# Patient Record
Sex: Female | Born: 1950
Health system: Southern US, Community
[De-identification: ages and names within clinical notes are randomized; demographics above are authoritative.]

## PROBLEM LIST (undated history)

## (undated) DIAGNOSIS — R7303 Prediabetes: Secondary | ICD-10-CM

## (undated) DIAGNOSIS — I10 Essential (primary) hypertension: Secondary | ICD-10-CM

## (undated) DIAGNOSIS — R0600 Dyspnea, unspecified: Secondary | ICD-10-CM

## (undated) DIAGNOSIS — M1990 Unspecified osteoarthritis, unspecified site: Secondary | ICD-10-CM

## (undated) DIAGNOSIS — G4730 Sleep apnea, unspecified: Secondary | ICD-10-CM

## (undated) DIAGNOSIS — E785 Hyperlipidemia, unspecified: Secondary | ICD-10-CM

## (undated) HISTORY — PX: KNEE ARTHROSCOPY: SUR90

## (undated) HISTORY — DX: Unspecified osteoarthritis, unspecified site: M19.90

## (undated) HISTORY — DX: Hyperlipidemia, unspecified: E78.5

## (undated) HISTORY — PX: EYE SURGERY: SHX253

## (undated) HISTORY — PX: ABDOMINAL HYSTERECTOMY: SHX81

## (undated) HISTORY — PX: LUNG BIOPSY: SHX232

---

## 2007-07-24 ENCOUNTER — Emergency Department (HOSPITAL_COMMUNITY): Admission: EM | Admit: 2007-07-24 | Discharge: 2007-07-24 | Payer: Self-pay | Admitting: Family Medicine

## 2007-08-05 ENCOUNTER — Ambulatory Visit (HOSPITAL_COMMUNITY): Admission: RE | Admit: 2007-08-05 | Discharge: 2007-08-05 | Payer: Self-pay | Admitting: Sports Medicine

## 2009-02-21 ENCOUNTER — Ambulatory Visit (HOSPITAL_BASED_OUTPATIENT_CLINIC_OR_DEPARTMENT_OTHER): Admission: RE | Admit: 2009-02-21 | Discharge: 2009-02-21 | Payer: Self-pay | Admitting: Orthopedic Surgery

## 2010-05-13 ENCOUNTER — Emergency Department (HOSPITAL_COMMUNITY)
Admission: EM | Admit: 2010-05-13 | Discharge: 2010-05-13 | Payer: Self-pay | Source: Home / Self Care | Admitting: Emergency Medicine

## 2010-05-14 LAB — COMPREHENSIVE METABOLIC PANEL
Albumin: 3.6 g/dL (ref 3.5–5.2)
Alkaline Phosphatase: 65 U/L (ref 39–117)
BUN: 13 mg/dL (ref 6–23)
Calcium: 8.9 mg/dL (ref 8.4–10.5)
Creatinine, Ser: 0.97 mg/dL (ref 0.4–1.2)
Potassium: 3.1 mEq/L — ABNORMAL LOW (ref 3.5–5.1)
Total Protein: 7 g/dL (ref 6.0–8.3)

## 2010-05-14 LAB — DIFFERENTIAL
Eosinophils Absolute: 0.3 10*3/uL (ref 0.0–0.7)
Eosinophils Relative: 4 % (ref 0–5)
Lymphs Abs: 3.1 10*3/uL (ref 0.7–4.0)

## 2010-05-14 LAB — PROTIME-INR: INR: 1.03 (ref 0.00–1.49)

## 2010-05-14 LAB — CBC
MCV: 84.3 fL (ref 78.0–100.0)
Platelets: 238 10*3/uL (ref 150–400)
RDW: 14.1 % (ref 11.5–15.5)
WBC: 7.7 10*3/uL (ref 4.0–10.5)

## 2010-05-22 ENCOUNTER — Ambulatory Visit (INDEPENDENT_AMBULATORY_CARE_PROVIDER_SITE_OTHER): Payer: 59 | Admitting: Internal Medicine

## 2010-05-22 DIAGNOSIS — K625 Hemorrhage of anus and rectum: Secondary | ICD-10-CM

## 2010-05-22 DIAGNOSIS — K5730 Diverticulosis of large intestine without perforation or abscess without bleeding: Secondary | ICD-10-CM

## 2010-06-29 NOTE — Consult Note (Signed)
NAMEANNALEIGHA, Jennifer [name]               ACCOUNT NO.:  [id number]  MEDICAL RECORD NO.:  [id number]          PATIENT TYPE: AMB  LOCATION:  [place]                            FACILITY:  [facility]  PHYSICIAN:  Lionel December, M.D.    DATE OF BIRTH:  Nov 18, 1950  DATE OF CONSULTATION:05/13/2010                                CONSULTATION   REASON FOR CONSULTATION:  Rectal bleeding.  HISTORY OF PRESENT ILLNESS:  Jennifer [name] is a 60 year old black female referred to our office by Aquilla Hacker, PA in [place], [place].  She states she has had rectal bleeding.  She was seen in the emergency department on May 13, 2010.  She states she was passing dark red, maroon colored blood.  She states the blood was dripping from her rectum and she had a puddle in her car.  She did present to the emergency room and evaluated.  Her hemoglobin on admission was 10.9, hematocrit was 31.2, her MCV was 84.3, platelets 238.  Her PT/INR 13.7 and 1.03, hersodium 133, potassium was 3.1, chloride 99, CO2 24, glucose 148, BUN 13, creatinine 0.97, her total bilirubin was 0.4, ALP 65, AST 20, ALT 16, total protein 7.0, albumin 3.6, calcium 8.9.  She did undergo a CT of the abdomen and pelvis with contrast which revealed no acute abnormality, small umbilical hernia containing fat, minimal hepatic steatosis, descending and sigmoid diverticulosis, small hiatal hernia, borderline cardiomegaly.  She says her bleeding has stopped 9 days ago. She states she was not constipated during this episode.  There was no pain associated with the rectal bleeding.  She does give a history of after her bleeding stopped that she did pass a couple of black stools. She does admit to taking 2 to 4 Aleve on a daily basis for her arthritis in her knees.  Her appetite has been good, there has been no weight loss.  She denies any acid reflux.  She has a bowel movement once or twice a week.  She does take laxative, some MiraLax on a regular  basis for constipation.  Her last colonoscopy was in April 2009 which revealed a 7-mm polyp snared from the sigmoid colon pancolonic diverticulosis. She had a moderate number of diverticula at the sigmoid and a few more involving the rest of the colon, small external hemorrhoids.  The biopsy revealed tubular adenoma.  ALLERGIES:  There are no known allergies.  HOME MEDICATIONS:  Triamterene/hydrochlorothiazide 5/25 one a day, atenolol 50 mg a day, hydrocodone as needed.  PAST SURGICAL HISTORY:  She has had a total hysterectomy.  She had a right lung biopsy which revealed sarcoidosis.  She has had arthroscopies to both her knees from arthritis, history of hypertension.  There is no family history of colon cancer.  FAMILY HISTORY:  Her mother is deceased from a CVA and diabetes.  Father is deceased from a CVA.  One sister in good health and two brothers in good health.  SOCIAL HISTORY:  She is married.  She works at [employer] as a Psychologist, sport and exercise.  She smokes half-pack a day for approximately 40 years, occasional alcohol.  She has  three children, one child has had a kidney transplant and 2 are in good health.  PHYSICAL EXAMINATION:  VITAL SIGNS:  Her weight is 214, her height is 5 feet 6 inches, her BMI is 35, temperature is 99.5, blood pressure is 140/86, and her pulse is 76. HEENT:  She has natural teeth.  Her oral mucosa is moist.  There are no lesions.  Her conjunctivae are pink.  Sclerae are anicteric. NECK:  Her thyroid is normal.  No cervical lymphadenopathy. LUNGS:  Clear. HEART:  Regular rate and rhythm. ABDOMEN:  Soft.  Bowel sounds are positive.  No masses felt.  She was guaiac negative today and her stool was brown.  ASSESSMENT:  Jennifer [name] is a 60 year old female who gives a history of rectal bleeding.  I suspect this was a diverticular bleed.  It was a painless bleed.  She also states after her bleeding stopped, she had a few black stools.  I suspect her  upper GI bleed was from the Aleve that she was taking.  I did discuss this case with Dr. Karilyn Cota.  The patient was given option of a colonoscopy or we could go ahead and proceed with a CBC.  If the CBC is normal, we will watch her.  She is not to take any more than 2 Aleve today and further recommendations once we have the CBC back, which will be drawn today.    ______________________________ Dorene Ar, NP   ______________________________ Lionel December, M.D.    TS/MEDQ  D:  05/23/2010  T:  05/24/2010  Job:  [id number]  Electronically Signed by Dorene Ar PA on 06/25/2010 10:02:29 AM Electronically Signed by Lionel December M.D. on 06/29/2010 01:18:50 PM

## 2010-07-23 LAB — POCT I-STAT 4, (NA,K, GLUC, HGB,HCT)
Glucose, Bld: 112 mg/dL — ABNORMAL HIGH (ref 70–99)
HCT: 43 % (ref 36.0–46.0)
Hemoglobin: 14.6 g/dL (ref 12.0–15.0)
Potassium: 3.6 mEq/L (ref 3.5–5.1)
Sodium: 141 mEq/L (ref 135–145)

## 2012-08-15 ENCOUNTER — Encounter (INDEPENDENT_AMBULATORY_CARE_PROVIDER_SITE_OTHER): Payer: Self-pay | Admitting: *Deleted

## 2012-12-21 ENCOUNTER — Other Ambulatory Visit (INDEPENDENT_AMBULATORY_CARE_PROVIDER_SITE_OTHER): Payer: Self-pay | Admitting: *Deleted

## 2012-12-21 ENCOUNTER — Telehealth (INDEPENDENT_AMBULATORY_CARE_PROVIDER_SITE_OTHER): Payer: Self-pay | Admitting: *Deleted

## 2012-12-21 DIAGNOSIS — Z1211 Encounter for screening for malignant neoplasm of colon: Secondary | ICD-10-CM

## 2012-12-21 DIAGNOSIS — Z86010 Personal history of colonic polyps: Secondary | ICD-10-CM

## 2012-12-21 NOTE — Telephone Encounter (Signed)
Patient needs movi prep -- nkda 

## 2012-12-23 MED ORDER — SOD PHOS MONO-SOD PHOS DIBASIC 1.102-0.398 G PO TABS: 1.0000 | ORAL_TABLET | Freq: Once | ORAL | Status: DC

## 2013-01-16 ENCOUNTER — Encounter (INDEPENDENT_AMBULATORY_CARE_PROVIDER_SITE_OTHER): Payer: Self-pay | Admitting: *Deleted

## 2013-01-18 ENCOUNTER — Telehealth (INDEPENDENT_AMBULATORY_CARE_PROVIDER_SITE_OTHER): Payer: Self-pay | Admitting: *Deleted

## 2013-01-18 NOTE — Telephone Encounter (Signed)
  Procedure: tcs  Reason/Indication:  Hx polyps  Has patient had this procedure before?  Yes, 2009 (paper chart)  If so, when, by whom and where?    Is there a family history of colon cancer?  no  Who?  What age when diagnosed?    Is patient diabetic?   no      Does patient have prosthetic heart valve?  no  Do you have a pacemaker?  no  Has patient ever had endocarditis? no  Has patient had joint replacement within last 12 months?  no  Does patient tend to be constipated or take laxatives?   Is patient on Coumadin, Plavix and/or Aspirin? no  Medications: triamterene 37.5 mg daily, atenolol 50 mg daily, hydrocodone 5/325 mg 1-2 tab daily, aleve prn, tylenol prn, ibuprofen prn  Allergies: nkda  Medication Adjustment:   Procedure date & time: 02/15/13 at 930

## 2013-01-19 NOTE — Telephone Encounter (Signed)
agree

## 2013-01-24 ENCOUNTER — Telehealth (INDEPENDENT_AMBULATORY_CARE_PROVIDER_SITE_OTHER): Payer: Self-pay | Admitting: *Deleted

## 2013-01-24 DIAGNOSIS — Z1211 Encounter for screening for malignant neoplasm of colon: Secondary | ICD-10-CM

## 2013-01-24 NOTE — Telephone Encounter (Signed)
Patient needs trilye 

## 2013-01-25 MED ORDER — PEG 3350-KCL-NA BICARB-NACL 420 G PO SOLR: 4000.0000 mL | Freq: Once | ORAL | Status: DC

## 2013-01-31 ENCOUNTER — Encounter (HOSPITAL_COMMUNITY): Payer: Self-pay | Admitting: Pharmacy Technician

## 2013-02-15 ENCOUNTER — Encounter (HOSPITAL_COMMUNITY): Admission: RE | Payer: Self-pay | Source: Ambulatory Visit

## 2013-02-15 ENCOUNTER — Ambulatory Visit (HOSPITAL_COMMUNITY): Admission: RE | Admit: 2013-02-15 | Payer: 59 | Source: Ambulatory Visit | Admitting: Internal Medicine

## 2013-02-15 SURGERY — COLONOSCOPY
Anesthesia: Moderate Sedation

## 2013-11-15 ENCOUNTER — Other Ambulatory Visit: Payer: Self-pay | Admitting: Orthopedic Surgery

## 2014-01-31 ENCOUNTER — Encounter (HOSPITAL_COMMUNITY): Payer: Self-pay

## 2014-02-02 ENCOUNTER — Encounter (HOSPITAL_COMMUNITY)
Admission: RE | Admit: 2014-02-02 | Discharge: 2014-02-02 | Disposition: A | Discharge status: Home / Self Care | Payer: 59 | Source: Ambulatory Visit | Attending: Orthopedic Surgery | Admitting: Orthopedic Surgery

## 2014-02-02 ENCOUNTER — Encounter (HOSPITAL_COMMUNITY): Payer: Self-pay

## 2014-02-02 DIAGNOSIS — Z01818 Encounter for other preprocedural examination: Secondary | ICD-10-CM | POA: Diagnosis not present

## 2014-02-02 DIAGNOSIS — I10 Essential (primary) hypertension: Secondary | ICD-10-CM | POA: Diagnosis not present

## 2014-02-02 HISTORY — DX: Essential (primary) hypertension: I10

## 2014-02-02 LAB — URINALYSIS, ROUTINE W REFLEX MICROSCOPIC
Bilirubin Urine: NEGATIVE
GLUCOSE, UA: NEGATIVE mg/dL
Hgb urine dipstick: NEGATIVE
Ketones, ur: NEGATIVE mg/dL
LEUKOCYTES UA: NEGATIVE
NITRITE: NEGATIVE
PROTEIN: NEGATIVE mg/dL
Specific Gravity, Urine: 1.015 (ref 1.005–1.030)
UROBILINOGEN UA: 0.2 mg/dL (ref 0.0–1.0)
pH: 5 (ref 5.0–8.0)

## 2014-02-02 LAB — CBC WITH DIFFERENTIAL/PLATELET
BASOS PCT: 1 % (ref 0–1)
Basophils Absolute: 0.1 10*3/uL (ref 0.0–0.1)
EOS ABS: 0.3 10*3/uL (ref 0.0–0.7)
Eosinophils Relative: 4 % (ref 0–5)
HCT: 41 % (ref 36.0–46.0)
HEMOGLOBIN: 13.8 g/dL (ref 12.0–15.0)
Lymphocytes Relative: 29 % (ref 12–46)
Lymphs Abs: 2 10*3/uL (ref 0.7–4.0)
MCH: 29 pg (ref 26.0–34.0)
MCHC: 33.7 g/dL (ref 30.0–36.0)
MCV: 86.1 fL (ref 78.0–100.0)
MONOS PCT: 5 % (ref 3–12)
Monocytes Absolute: 0.4 10*3/uL (ref 0.1–1.0)
Neutro Abs: 4.2 10*3/uL (ref 1.7–7.7)
Neutrophils Relative %: 61 % (ref 43–77)
PLATELETS: 257 10*3/uL (ref 150–400)
RBC: 4.76 MIL/uL (ref 3.87–5.11)
RDW: 14 % (ref 11.5–15.5)
WBC: 6.8 10*3/uL (ref 4.0–10.5)

## 2014-02-02 LAB — COMPREHENSIVE METABOLIC PANEL
ALBUMIN: 3.8 g/dL (ref 3.5–5.2)
ALK PHOS: 94 U/L (ref 39–117)
ALT: 20 U/L (ref 0–35)
ANION GAP: 13 (ref 5–15)
AST: 21 U/L (ref 0–37)
BUN: 17 mg/dL (ref 6–23)
CO2: 25 mEq/L (ref 19–32)
Calcium: 9.8 mg/dL (ref 8.4–10.5)
Chloride: 100 mEq/L (ref 96–112)
Creatinine, Ser: 0.85 mg/dL (ref 0.50–1.10)
GFR calc Af Amer: 85 mL/min — ABNORMAL LOW (ref >90)
GFR calc non Af Amer: 73 mL/min — ABNORMAL LOW (ref >90)
Glucose, Bld: 116 mg/dL — ABNORMAL HIGH (ref 70–99)
Potassium: 3.9 mEq/L (ref 3.7–5.3)
Sodium: 138 mEq/L (ref 137–147)
TOTAL PROTEIN: 8.2 g/dL (ref 6.0–8.3)
Total Bilirubin: 0.3 mg/dL (ref 0.3–1.2)

## 2014-02-02 LAB — APTT: aPTT: 26 seconds (ref 24–37)

## 2014-02-02 LAB — PROTIME-INR
INR: 1.03 (ref 0.00–1.49)
Prothrombin Time: 13.6 seconds (ref 11.6–15.2)

## 2014-02-02 LAB — SURGICAL PCR SCREEN
MRSA, PCR: NEGATIVE
STAPHYLOCOCCUS AUREUS: NEGATIVE

## 2014-02-02 NOTE — Pre-Procedure Instructions (Signed)
Jennifer RoachHazel W [name]  02/02/2014   Your procedure is scheduled on:   Monday  02/12/14  [facility] at 530 AM.  Call this number if you have problems the morning of surgery: [phone]   Remember:   Do not eat food or drink liquids after midnight.   Take these medicines the morning of surgery with A SIP OF WATER:  ATENOLOL (TENORMIN)    (STOP IBUPROFEN , NARROXEN, ALEVE)   Do not wear jewelry, make-up or nail polish.  Do not wear lotions, powders, or perfumes. You may wear deodorant.  Do not shave 48 hours prior to surgery. Men may shave face and neck.  Do not bring valuables to the hospital.  [facility] is not responsible                  for any belongings or valuables.               Contacts, dentures or bridgework may not be worn into surgery.  Leave suitcase in the car. After surgery it may be brought to your room.  For patients admitted to the hospital, discharge time is determined by your                treatment team.               Patients discharged the day of surgery will not be allowed to drive  home.  Name and phone number of your driver:   Special Instructions:  Special Instructions: [place] - Preparing for Surgery  Before surgery, you can play an important role.  Because skin is not sterile, your skin needs to be as free of germs as possible.  You can reduce the number of germs on you skin by washing with CHG (chlorahexidine gluconate) soap before surgery.  CHG is an antiseptic cleaner which kills germs and bonds with the skin to continue killing germs even after washing.  Please DO NOT use if you have an allergy to CHG or antibacterial soaps.  If your skin becomes reddened/irritated stop using the CHG and inform your nurse when you arrive at Short Stay.  Do not shave (including legs and underarms) for at least 48 hours prior to the first CHG shower.  You may shave your face.  Please follow these instructions carefully:   1.  Shower  with CHG Soap the night before surgery and the morning of Surgery.  2.  If you choose to wash your hair, wash your hair first as usual with your normal shampoo.  3.  After you shampoo, rinse your hair and body thoroughly to remove the Shampoo.  4.  Use CHG as you would any other liquid soap. You can apply chg directly to the skin and wash gently with scrungie or a clean washcloth.  5.  Apply the CHG Soap to your body ONLY FROM THE NECK DOWN.  Do not use on open wounds or open sores.  Avoid contact with your eyes, ears, mouth and genitals (private parts).  Wash genitals (private parts with your normal soap.  6.  Wash thoroughly, paying special attention to the area where your surgery will be performed.  7.  Thoroughly rinse your body with warm water from the neck down.  8.  DO NOT shower/wash with your normal soap after using and rinsing off the CHG Soap.  9.  Pat yourself dry with a clean towel.  10.  Wear clean pajamas.            11.  Place clean sheets on your bed the night of your first shower and do not sleep with pets.  Day of Surgery  Do not apply any lotions/deodorants the morning of surgery.  Please wear clean clothes to the hospital/surgery center.   Please read over the following fact sheets that you were given: Pain Booklet, Coughing and Deep Breathing, MRSA Information and Surgical Site Infection Prevention

## 2014-02-02 NOTE — Progress Notes (Signed)
02/02/14 1024  OBSTRUCTIVE SLEEP APNEA  Have you ever been diagnosed with sleep apnea through a sleep study? No  Do you snore loudly (loud enough to be heard through closed doors)?  1  Do you often feel tired, fatigued, or sleepy during the daytime? 1 (WORKS 3RD SHIFT)  Has anyone observed you stop breathing during your sleep? 0  Do you have, or are you being treated for high blood pressure? 1  BMI more than 35 kg/m2? 1  Age over 135 years old? 1  Neck circumference greater than 40 cm/16 inches? 0  Gender: 0  Obstructive Sleep Apnea Score 5  Score 4 or greater  Results sent to PCP

## 2014-02-02 NOTE — Progress Notes (Signed)
Anesthesia Chart Review:  Pt is 63 year old female scheduled for R total knee replacement on 02/12/2014 with Dr. Sherlean FootLucey.   PMH: HTN. BMI 38. Current smoker.   Medications include: atenolol, maxzide  Preoperative labs reviewed.    Chest x-ray reviewed. Generalized interstitial prominence. 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 No frank edema or consolidation. Mild scarring in the bases.  EKG: NSR. Possible LA enlargement. Incomplete RBBB. Cannot rule out anterior infarct, age undetermined. R wave is lower in V3, otherwise not significantly changed from previous.   If no changes, I anticipate pt can proceed with surgery as scheduled.   Jennifer Mastngela Kabbe, FNP-BC [facility]/Anesthesiology Phone: [phone] 02/02/2014 3:56 PM

## 2014-02-03 LAB — URINE CULTURE
Colony Count: NO GROWTH
Culture: NO GROWTH

## 2014-02-11 MED ORDER — TRANEXAMIC ACID 100 MG/ML IV SOLN
1000.0000 mg | INTRAVENOUS | Status: AC
  Administered 2014-02-12: 1000 mg via INTRAVENOUS
  Filled 2014-02-11: qty 10

## 2014-02-11 MED ORDER — CEFAZOLIN SODIUM-DEXTROSE 2-3 GM-% IV SOLR
2.0000 g | INTRAVENOUS | Status: AC
  Administered 2014-02-12: 2 g via INTRAVENOUS
  Filled 2014-02-11: qty 50

## 2014-02-11 MED ORDER — CHLORHEXIDINE GLUCONATE 4 % EX LIQD
60.0000 mL | Freq: Once | CUTANEOUS | Status: DC
  Filled 2014-02-11: qty 60

## 2014-02-11 MED ORDER — BUPIVACAINE LIPOSOME 1.3 % IJ SUSP
20.0000 mL | Freq: Once | INTRAMUSCULAR | Status: DC
  Filled 2014-02-11: qty 20

## 2014-02-12 ENCOUNTER — Encounter (HOSPITAL_COMMUNITY): Payer: 59 | Admitting: Emergency Medicine

## 2014-02-12 ENCOUNTER — Encounter (HOSPITAL_COMMUNITY)
Admission: RE | Disposition: A | Discharge status: Home Health | Payer: Self-pay | Source: Ambulatory Visit | Attending: Orthopedic Surgery

## 2014-02-12 ENCOUNTER — Inpatient Hospital Stay (HOSPITAL_COMMUNITY): Payer: 59 | Admitting: Anesthesiology

## 2014-02-12 ENCOUNTER — Encounter (HOSPITAL_COMMUNITY): Payer: Self-pay | Admitting: *Deleted

## 2014-02-12 ENCOUNTER — Inpatient Hospital Stay (HOSPITAL_COMMUNITY)
Admission: RE | Admit: 2014-02-12 | Discharge: 2014-02-13 | DRG: 470 | Disposition: A | Discharge status: Home Health | Payer: 59 | Source: Ambulatory Visit | Attending: Orthopedic Surgery | Admitting: Orthopedic Surgery

## 2014-02-12 DIAGNOSIS — Z96659 Presence of unspecified artificial knee joint: Secondary | ICD-10-CM

## 2014-02-12 DIAGNOSIS — D62 Acute posthemorrhagic anemia: Secondary | ICD-10-CM | POA: Diagnosis not present

## 2014-02-12 DIAGNOSIS — Z96651 Presence of right artificial knee joint: Secondary | ICD-10-CM

## 2014-02-12 DIAGNOSIS — I10 Essential (primary) hypertension: Secondary | ICD-10-CM | POA: Diagnosis present

## 2014-02-12 DIAGNOSIS — F17210 Nicotine dependence, cigarettes, uncomplicated: Secondary | ICD-10-CM | POA: Diagnosis present

## 2014-02-12 DIAGNOSIS — M25561 Pain in right knee: Secondary | ICD-10-CM | POA: Diagnosis present

## 2014-02-12 DIAGNOSIS — M1711 Unilateral primary osteoarthritis, right knee: Secondary | ICD-10-CM | POA: Diagnosis present

## 2014-02-12 HISTORY — PX: TOTAL KNEE ARTHROPLASTY: SHX125

## 2014-02-12 LAB — CBC
HEMATOCRIT: 38.4 % (ref 36.0–46.0)
Hemoglobin: 12.6 g/dL (ref 12.0–15.0)
MCH: 29 pg (ref 26.0–34.0)
MCHC: 32.8 g/dL (ref 30.0–36.0)
MCV: 88.5 fL (ref 78.0–100.0)
PLATELETS: 215 10*3/uL (ref 150–400)
RBC: 4.34 MIL/uL (ref 3.87–5.11)
RDW: 14.1 % (ref 11.5–15.5)
WBC: 7 10*3/uL (ref 4.0–10.5)

## 2014-02-12 LAB — CREATININE, SERUM
Creatinine, Ser: 0.58 mg/dL (ref 0.50–1.10)
GFR calc non Af Amer: >90 mL/min (ref >90)

## 2014-02-12 SURGERY — ARTHROPLASTY, KNEE, TOTAL
Anesthesia: Spinal | Laterality: Right

## 2014-02-12 MED ORDER — ARTIFICIAL TEARS OP OINT
TOPICAL_OINTMENT | OPHTHALMIC | Status: AC
  Filled 2014-02-12: qty 3.5

## 2014-02-12 MED ORDER — PROPOFOL 10 MG/ML IV BOLUS
INTRAVENOUS | Status: AC
  Filled 2014-02-12: qty 20

## 2014-02-12 MED ORDER — ROCURONIUM BROMIDE 50 MG/5ML IV SOLN
INTRAVENOUS | Status: AC
  Filled 2014-02-12: qty 1

## 2014-02-12 MED ORDER — ACETAMINOPHEN 325 MG PO TABS: 650.0000 mg | ORAL_TABLET | Freq: Four times a day (QID) | ORAL | Status: DC | PRN

## 2014-02-12 MED ORDER — BUPIVACAINE-EPINEPHRINE (PF) 0.5% -1:200000 IJ SOLN
INTRAMUSCULAR | Status: AC
  Filled 2014-02-12: qty 30

## 2014-02-12 MED ORDER — LACTATED RINGERS IV SOLN
INTRAVENOUS | Status: DC | PRN
  Administered 2014-02-12 (×2): via INTRAVENOUS

## 2014-02-12 MED ORDER — FLEET ENEMA 7-19 GM/118ML RE ENEM: 1.0000 | ENEMA | Freq: Once | RECTAL | Status: AC | PRN

## 2014-02-12 MED ORDER — MIDAZOLAM HCL 2 MG/2ML IJ SOLN
INTRAMUSCULAR | Status: AC
  Filled 2014-02-12: qty 2

## 2014-02-12 MED ORDER — METOCLOPRAMIDE HCL 10 MG PO TABS: 5.0000 mg | ORAL_TABLET | Freq: Three times a day (TID) | ORAL | Status: DC | PRN

## 2014-02-12 MED ORDER — CEFAZOLIN SODIUM-DEXTROSE 2-3 GM-% IV SOLR
2.0000 g | Freq: Four times a day (QID) | INTRAVENOUS | Status: AC
  Administered 2014-02-12 (×2): 2 g via INTRAVENOUS
  Filled 2014-02-12 (×2): qty 50

## 2014-02-12 MED ORDER — METHOCARBAMOL 1000 MG/10ML IJ SOLN
500.0000 mg | Freq: Four times a day (QID) | INTRAVENOUS | Status: DC | PRN
  Filled 2014-02-12: qty 5

## 2014-02-12 MED ORDER — SUCCINYLCHOLINE CHLORIDE 20 MG/ML IJ SOLN
INTRAMUSCULAR | Status: AC
  Filled 2014-02-12: qty 1

## 2014-02-12 MED ORDER — SODIUM CHLORIDE 0.9 % IV SOLN: INTRAVENOUS | Status: DC

## 2014-02-12 MED ORDER — BISACODYL 5 MG PO TBEC: 5.0000 mg | DELAYED_RELEASE_TABLET | Freq: Every day | ORAL | Status: DC | PRN

## 2014-02-12 MED ORDER — BUPIVACAINE-EPINEPHRINE 0.5% -1:200000 IJ SOLN
INTRAMUSCULAR | Status: DC | PRN
  Administered 2014-02-12: 30 mL

## 2014-02-12 MED ORDER — ONDANSETRON HCL 4 MG/2ML IJ SOLN
INTRAMUSCULAR | Status: DC | PRN
  Administered 2014-02-12: 4 mg via INTRAVENOUS

## 2014-02-12 MED ORDER — OXYCODONE HCL 5 MG/5ML PO SOLN: 5.0000 mg | Freq: Once | ORAL | Status: DC | PRN

## 2014-02-12 MED ORDER — HYDROMORPHONE HCL 1 MG/ML IJ SOLN: 0.2500 mg | INTRAMUSCULAR | Status: DC | PRN

## 2014-02-12 MED ORDER — SODIUM CHLORIDE 0.9 % IV SOLN
INTRAVENOUS | Status: DC
  Administered 2014-02-12: 12:00:00 via INTRAVENOUS
  Administered 2014-02-13: 75 mL/h via INTRAVENOUS

## 2014-02-12 MED ORDER — OXYCODONE HCL 5 MG PO TABS: 5.0000 mg | ORAL_TABLET | Freq: Once | ORAL | Status: DC | PRN

## 2014-02-12 MED ORDER — BUPIVACAINE IN DEXTROSE 0.75-8.25 % IT SOLN
INTRATHECAL | Status: DC | PRN
  Administered 2014-02-12: 2 mL via INTRATHECAL

## 2014-02-12 MED ORDER — ONDANSETRON HCL 4 MG/2ML IJ SOLN
4.0000 mg | Freq: Four times a day (QID) | INTRAMUSCULAR | Status: DC | PRN
  Administered 2014-02-12: 4 mg via INTRAVENOUS
  Filled 2014-02-12: qty 2

## 2014-02-12 MED ORDER — TRIAMTERENE-HCTZ 37.5-25 MG PO TABS
1.0000 | ORAL_TABLET | Freq: Every day | ORAL | Status: DC
  Administered 2014-02-12 – 2014-02-13 (×2): 1 via ORAL
  Filled 2014-02-12 (×2): qty 1

## 2014-02-12 MED ORDER — BUPIVACAINE LIPOSOME 1.3 % IJ SUSP
INTRAMUSCULAR | Status: DC | PRN
  Administered 2014-02-12: 20 mL

## 2014-02-12 MED ORDER — DIPHENHYDRAMINE HCL 12.5 MG/5ML PO ELIX: 12.5000 mg | ORAL_SOLUTION | ORAL | Status: DC | PRN

## 2014-02-12 MED ORDER — ZOLPIDEM TARTRATE 5 MG PO TABS: 5.0000 mg | ORAL_TABLET | Freq: Every evening | ORAL | Status: DC | PRN

## 2014-02-12 MED ORDER — LIDOCAINE HCL (CARDIAC) 20 MG/ML IV SOLN
INTRAVENOUS | Status: DC | PRN
  Administered 2014-02-12: 50 mg via INTRAVENOUS

## 2014-02-12 MED ORDER — 0.9 % SODIUM CHLORIDE (POUR BTL) OPTIME
TOPICAL | Status: DC | PRN
  Administered 2014-02-12: 1000 mL

## 2014-02-12 MED ORDER — SENNOSIDES-DOCUSATE SODIUM 8.6-50 MG PO TABS: 1.0000 | ORAL_TABLET | Freq: Every evening | ORAL | Status: DC | PRN

## 2014-02-12 MED ORDER — FENTANYL CITRATE 0.05 MG/ML IJ SOLN
INTRAMUSCULAR | Status: DC | PRN
  Administered 2014-02-12: 50 ug via INTRAVENOUS

## 2014-02-12 MED ORDER — ONDANSETRON HCL 4 MG/2ML IJ SOLN
INTRAMUSCULAR | Status: AC
  Filled 2014-02-12: qty 2

## 2014-02-12 MED ORDER — SODIUM CHLORIDE 0.9 % IR SOLN
Status: DC | PRN
  Administered 2014-02-12 (×2): 1000 mL

## 2014-02-12 MED ORDER — ATENOLOL 50 MG PO TABS
50.0000 mg | ORAL_TABLET | Freq: Every day | ORAL | Status: DC
  Administered 2014-02-13: 50 mg via ORAL
  Filled 2014-02-12 (×2): qty 1

## 2014-02-12 MED ORDER — MIDAZOLAM HCL 5 MG/5ML IJ SOLN
INTRAMUSCULAR | Status: DC | PRN
  Administered 2014-02-12: 2 mg via INTRAVENOUS

## 2014-02-12 MED ORDER — HYDROMORPHONE HCL 1 MG/ML IJ SOLN
1.0000 mg | INTRAMUSCULAR | Status: DC | PRN
  Administered 2014-02-12 (×4): 1 mg via INTRAVENOUS
  Filled 2014-02-12 (×4): qty 1

## 2014-02-12 MED ORDER — LIDOCAINE HCL (CARDIAC) 20 MG/ML IV SOLN
INTRAVENOUS | Status: AC
  Filled 2014-02-12: qty 5

## 2014-02-12 MED ORDER — ONDANSETRON HCL 4 MG PO TABS: 4.0000 mg | ORAL_TABLET | Freq: Four times a day (QID) | ORAL | Status: DC | PRN

## 2014-02-12 MED ORDER — PROMETHAZINE HCL 25 MG/ML IJ SOLN: 6.2500 mg | INTRAMUSCULAR | Status: DC | PRN

## 2014-02-12 MED ORDER — FENTANYL CITRATE 0.05 MG/ML IJ SOLN
INTRAMUSCULAR | Status: AC
  Filled 2014-02-12: qty 5

## 2014-02-12 MED ORDER — PROPOFOL INFUSION 10 MG/ML OPTIME
INTRAVENOUS | Status: DC | PRN
  Administered 2014-02-12: 75 ug/kg/min via INTRAVENOUS

## 2014-02-12 MED ORDER — ENOXAPARIN SODIUM 30 MG/0.3ML ~~LOC~~ SOLN
30.0000 mg | Freq: Two times a day (BID) | SUBCUTANEOUS | Status: DC
  Administered 2014-02-13: 30 mg via SUBCUTANEOUS
  Filled 2014-02-12 (×3): qty 0.3

## 2014-02-12 MED ORDER — OXYCODONE HCL ER 10 MG PO T12A
10.0000 mg | EXTENDED_RELEASE_TABLET | Freq: Two times a day (BID) | ORAL | Status: DC
  Administered 2014-02-12 – 2014-02-13 (×3): 10 mg via ORAL
  Filled 2014-02-12 (×3): qty 1

## 2014-02-12 MED ORDER — PHENOL 1.4 % MT LIQD: 1.0000 | OROMUCOSAL | Status: DC | PRN

## 2014-02-12 MED ORDER — METHOCARBAMOL 500 MG PO TABS
500.0000 mg | ORAL_TABLET | Freq: Four times a day (QID) | ORAL | Status: DC | PRN
  Administered 2014-02-12 – 2014-02-13 (×3): 500 mg via ORAL
  Filled 2014-02-12 (×3): qty 1

## 2014-02-12 MED ORDER — DOCUSATE SODIUM 100 MG PO CAPS
100.0000 mg | ORAL_CAPSULE | Freq: Two times a day (BID) | ORAL | Status: DC
  Administered 2014-02-12 – 2014-02-13 (×3): 100 mg via ORAL
  Filled 2014-02-12 (×3): qty 1

## 2014-02-12 MED ORDER — ALUM & MAG HYDROXIDE-SIMETH 200-200-20 MG/5ML PO SUSP: 30.0000 mL | ORAL | Status: DC | PRN

## 2014-02-12 MED ORDER — ACETAMINOPHEN 650 MG RE SUPP: 650.0000 mg | Freq: Four times a day (QID) | RECTAL | Status: DC | PRN

## 2014-02-12 MED ORDER — OXYCODONE HCL 5 MG PO TABS
5.0000 mg | ORAL_TABLET | ORAL | Status: DC | PRN
  Administered 2014-02-12 – 2014-02-13 (×6): 10 mg via ORAL
  Filled 2014-02-12 (×6): qty 2

## 2014-02-12 MED ORDER — CELECOXIB 200 MG PO CAPS
200.0000 mg | ORAL_CAPSULE | Freq: Two times a day (BID) | ORAL | Status: DC
  Administered 2014-02-12 – 2014-02-13 (×3): 200 mg via ORAL
  Filled 2014-02-12 (×4): qty 1

## 2014-02-12 MED ORDER — METOCLOPRAMIDE HCL 5 MG/ML IJ SOLN: 5.0000 mg | Freq: Three times a day (TID) | INTRAMUSCULAR | Status: DC | PRN

## 2014-02-12 MED ORDER — MENTHOL 3 MG MT LOZG: 1.0000 | LOZENGE | OROMUCOSAL | Status: DC | PRN

## 2014-02-12 SURGICAL SUPPLY — 57 items
BANDAGE ELASTIC 4 VELCRO ST LF (GAUZE/BANDAGES/DRESSINGS) ×2 IMPLANT
BANDAGE ELASTIC 6 VELCRO ST LF (GAUZE/BANDAGES/DRESSINGS) ×2 IMPLANT
BANDAGE ESMARK 6X9 LF (GAUZE/BANDAGES/DRESSINGS) ×1 IMPLANT
BLADE SAGITTAL 13X1.27X60 (BLADE) ×2 IMPLANT
BLADE SAW SGTL 83.5X18.5 (BLADE) ×2 IMPLANT
BLADE SURG 10 STRL SS (BLADE) ×2 IMPLANT
BNDG ESMARK 6X9 LF (GAUZE/BANDAGES/DRESSINGS) ×2
BOWL SMART MIX CTS (DISPOSABLE) ×2 IMPLANT
CAP KNEE CMT PERSONA ×2 IMPLANT
CEMENT BONE SIMPLEX SPEEDSET (Cement) ×4 IMPLANT
COVER SURGICAL LIGHT HANDLE (MISCELLANEOUS) ×2 IMPLANT
CUFF TOURNIQUET SINGLE 34IN LL (TOURNIQUET CUFF) ×2 IMPLANT
DRAPE EXTREMITY T 121X128X90 (DRAPE) ×2 IMPLANT
DRAPE INCISE IOBAN 66X45 STRL (DRAPES) ×4 IMPLANT
DRAPE PROXIMA HALF (DRAPES) IMPLANT
DRAPE U-SHAPE 47X51 STRL (DRAPES) ×2 IMPLANT
DRSG ADAPTIC 3X8 NADH LF (GAUZE/BANDAGES/DRESSINGS) ×2 IMPLANT
DRSG PAD ABDOMINAL 8X10 ST (GAUZE/BANDAGES/DRESSINGS) ×2 IMPLANT
DURAPREP 26ML APPLICATOR (WOUND CARE) ×4 IMPLANT
ELECT REM PT RETURN 9FT ADLT (ELECTROSURGICAL) ×2
ELECTRODE REM PT RTRN 9FT ADLT (ELECTROSURGICAL) ×1 IMPLANT
EVACUATOR 1/8 PVC DRAIN (DRAIN) ×2 IMPLANT
GAUZE SPONGE 4X4 12PLY STRL (GAUZE/BANDAGES/DRESSINGS) ×2 IMPLANT
GLOVE BIOGEL M 7.0 STRL (GLOVE) IMPLANT
GLOVE BIOGEL PI IND STRL 7.5 (GLOVE) IMPLANT
GLOVE BIOGEL PI IND STRL 8.5 (GLOVE) ×2 IMPLANT
GLOVE BIOGEL PI INDICATOR 7.5 (GLOVE)
GLOVE BIOGEL PI INDICATOR 8.5 (GLOVE) ×2
GLOVE SURG ORTHO 8.0 STRL STRW (GLOVE) ×4 IMPLANT
GOWN STRL REUS W/ TWL LRG LVL3 (GOWN DISPOSABLE) ×1 IMPLANT
GOWN STRL REUS W/ TWL XL LVL3 (GOWN DISPOSABLE) ×1 IMPLANT
GOWN STRL REUS W/TWL LRG LVL3 (GOWN DISPOSABLE) ×1
GOWN STRL REUS W/TWL XL LVL3 (GOWN DISPOSABLE) ×1
HANDPIECE INTERPULSE COAX TIP (DISPOSABLE) ×1
HOOD PEEL AWAY FACE SHEILD DIS (HOOD) ×6 IMPLANT
KIT BASIN OR (CUSTOM PROCEDURE TRAY) ×2 IMPLANT
KIT ROOM TURNOVER OR (KITS) ×2 IMPLANT
MANIFOLD NEPTUNE II (INSTRUMENTS) ×2 IMPLANT
NEEDLE 22X1 1/2 (OR ONLY) (NEEDLE) ×4 IMPLANT
NS IRRIG 1000ML POUR BTL (IV SOLUTION) ×2 IMPLANT
PACK TOTAL JOINT (CUSTOM PROCEDURE TRAY) ×2 IMPLANT
PAD ARMBOARD 7.5X6 YLW CONV (MISCELLANEOUS) ×4 IMPLANT
PADDING CAST COTTON 6X4 STRL (CAST SUPPLIES) ×2 IMPLANT
SET HNDPC FAN SPRY TIP SCT (DISPOSABLE) ×1 IMPLANT
SPONGE GAUZE 4X4 12PLY STER LF (GAUZE/BANDAGES/DRESSINGS) ×2 IMPLANT
STAPLER VISISTAT 35W (STAPLE) ×2 IMPLANT
SUCTION FRAZIER TIP 10 FR DISP (SUCTIONS) ×2 IMPLANT
SUT BONE WAX W31G (SUTURE) ×2 IMPLANT
SUT VIC AB 0 CTB1 27 (SUTURE) ×4 IMPLANT
SUT VIC AB 1 CT1 27 (SUTURE) ×2
SUT VIC AB 1 CT1 27XBRD ANBCTR (SUTURE) ×2 IMPLANT
SUT VIC AB 2-0 CT1 27 (SUTURE) ×2
SUT VIC AB 2-0 CT1 TAPERPNT 27 (SUTURE) ×2 IMPLANT
SYR 20CC LL (SYRINGE) ×4 IMPLANT
TOWEL OR 17X24 6PK STRL BLUE (TOWEL DISPOSABLE) ×2 IMPLANT
TOWEL OR 17X26 10 PK STRL BLUE (TOWEL DISPOSABLE) ×2 IMPLANT
WATER STERILE IRR 1000ML POUR (IV SOLUTION) ×4 IMPLANT

## 2014-02-12 NOTE — Progress Notes (Signed)
Utilization review completed.  

## 2014-02-12 NOTE — Evaluation (Signed)
Physical Therapy Evaluation Patient Details Name: Leighton RoachHazel W Macfarlane MRN: [id number] DOB: Dec 10, 1950 Today's Date: 02/12/2014   History of Present Illness  Pt admitted for R TKA  Clinical Impression  Pt pleasant and moving well for POD#0. Pt with painful Right knee in bed grossly 3/10 but up to 9/10 right knee after activity and ambulation with leg elevated and RN notified. Pt will benefit from acute therapy to maximize mobility, ROM, function and gait to decrease burden of care.     Follow Up Recommendations Home health PT;Supervision for mobility/OOB    Equipment Recommendations  None recommended by PT    Recommendations for Other Services       Precautions / Restrictions Precautions Precautions: Knee Restrictions Weight Bearing Restrictions: Yes RLE Weight Bearing: Weight bearing as tolerated      Mobility  Bed Mobility Overal bed mobility: Modified Independent             General bed mobility comments: pt able to pivot to EOB without assist with use of rail  Transfers Overall transfer level: Needs assistance   Transfers: Sit to/from Stand Sit to Stand: Min assist         General transfer comment: cues for hand and RLE placement with assist to stand and control descent  Ambulation/Gait Ambulation/Gait assistance: Min guard Ambulation Distance (Feet): 15 Feet (15' x 2) Assistive device: Rolling walker (2 wheeled) Gait Pattern/deviations: Step-to pattern;Antalgic;Trunk flexed;Decreased stance time - right   Gait velocity interpretation: Below normal speed for age/gender General Gait Details: pt ambulated to toilet and back with assist for pericare and cueing throughout for sequence and safety   Stairs            Wheelchair Mobility    Modified Rankin (Stroke Patients Only)       Balance                                             Pertinent Vitals/Pain Pain Assessment: 0-10 Pain Score: 9  Pain Location: right knee Pain  Descriptors / Indicators: Aching Pain Intervention(s): Repositioned;Patient requesting pain meds-RN notified    Home Living Family/patient expects to be discharged to:: Private residence Living Arrangements: Spouse/significant other Available Help at Discharge: Family;Available 24 hours/day Type of Home: House Home Access: Stairs to enter   [employer] Corporation[employer]trance Stairs-Number of Steps: 1 Home Layout: One level Home Equipment: Walker - 2 wheels;Bedside commode      Prior Function Level of Independence: Independent               Hand Dominance        Extremity/Trunk Assessment   Upper Extremity Assessment: Overall WFL for tasks assessed           Lower Extremity Assessment: RLE deficits/detail RLE Deficits / Details: limited by post op pain    Cervical / Trunk Assessment: Normal  Communication   Communication: No difficulties  Cognition Arousal/Alertness: Awake/alert Behavior During Therapy: WFL for tasks assessed/performed Overall Cognitive Status: Within Functional Limits for tasks assessed                      General Comments      Exercises Total Joint Exercises Ankle Circles/Pumps: AROM;Right;5 reps;Seated Quad Sets: AROM;Seated;Right;5 reps Heel Slides: AAROM;Right;Seated (x1 and pain limiting further reps)      Assessment/Plan    PT Assessment Patient needs continued PT services  PT Diagnosis Difficulty walking;Acute pain   PT Problem List Decreased strength;Decreased range of motion;Decreased activity tolerance;Decreased mobility;Pain;Decreased knowledge of precautions;Decreased knowledge of use of DME  PT Treatment Interventions DME instruction;Gait training;Stair training;Functional mobility training;Therapeutic activities;Therapeutic exercise;Patient/family education   PT Goals (Current goals can be found in the Care Plan section) Acute Rehab PT Goals Patient Stated Goal: be able to walk PT Goal Formulation: With patient/family Time For Goal  Achievement: 02/19/14 Potential to Achieve Goals: Good    Frequency 7X/week   Barriers to discharge        Co-evaluation               End of Session Equipment Utilized During Treatment: Gait belt Activity Tolerance: Patient tolerated treatment well Patient left: in chair;with call bell/phone within reach;with family/visitor present Nurse Communication: Mobility status         Time: [id number] PT Time Calculation (min): 23 min   Charges:   PT Evaluation $Initial PT Evaluation Tier I: 1 Procedure PT Treatments $Therapeutic Activity: 8-22 mins   PT G CodesDelorse Lek:          Tabor, Sonam Huelsmann Beth 02/12/2014, 2:31 PM Delaney MeigsMaija Tabor Jered Heiny, PT [phone]

## 2014-02-12 NOTE — Anesthesia Postprocedure Evaluation (Signed)
  Anesthesia Post-op Note  Patient: Jennifer BurgerHazel W Menz  Procedure(s) Performed: Procedure(s): TOTAL KNEE ARTHROPLASTY (Right)  Patient Location: PACU  Anesthesia Type:MAC and Spinal  Level of Consciousness: awake, alert  and oriented  Airway and Oxygen Therapy: Patient Spontanous Breathing  Post-op Pain: none  Post-op Assessment: Post-op Vital signs reviewed  Post-op Vital Signs: Reviewed  Last Vitals:  Filed Vitals:   02/12/14 1115  BP: 133/76  Pulse: 53  Temp:   Resp: 14    Complications: No apparent anesthesia complications

## 2014-02-12 NOTE — Op Note (Signed)
TOTAL KNEE REPLACEMENT OPERATIVE NOTE:  02/12/2014  5:49 PM  PATIENT:  Jennifer [name]  63 y.o. female  PRE-OPERATIVE DIAGNOSIS:  osteoarthritis right knee  POST-OPERATIVE DIAGNOSIS:  osteoarthritis right knee  PROCEDURE:  Procedure(s): TOTAL KNEE ARTHROPLASTY  SURGEON:  Surgeon(s): Dannielle HuhSteve Danah Reinecke, MD  PHYSICIAN ASSISTANT: Altamese CabalMaurice Jones, [facility]  ANESTHESIA:   spinal  DRAINS: Hemovac  SPECIMEN: None  COUNTS:  Correct  TOURNIQUET:   Total Tourniquet Time Documented: Thigh (Right) - 57 minutes Total: Thigh (Right) - 57 minutes   DICTATION:  Indication for procedure:    The patient is a 63 y.o. female who has failed conservative treatment for osteoarthritis right knee.  Informed consent was obtained prior to anesthesia. The risks versus benefits of the operation were explain and in a way the patient can, and did, understand.   On the implant demand matching protocol, this patient scored 8.  Therefore, this patient was not receive a polyethylene insert with vitamin E which is a high demand implant.  Description of procedure:     The patient was taken to the operating room and placed under anesthesia.  The patient was positioned in the usual fashion taking care that all body parts were adequately padded and/or protected.  I foley catheter was not placed.  A tourniquet was applied and the leg prepped and draped in the usual sterile fashion.  The extremity was exsanguinated with the esmarch and tourniquet inflated to 350 mmHg.  Pre-operative range of motion was normal.  The knee was in 8 degree of mild varus.  A midline incision approximately 6-7 inches long was made with a #10 blade.  A new blade was used to make a parapatellar arthrotomy going 2-3 cm into the quadriceps tendon, over the patella, and alongside the medial aspect of the patellar tendon.  A synovectomy was then performed with the #10 blade and forceps. I then elevated the deep MCL off the medial tibial metaphysis  subperiosteally around to the semimembranosus attachment.    I everted the patella and used calipers to measure patellar thickness.  I used the reamer to ream down to appropriate thickness to recreate the native thickness.  I then removed excess bone with the rongeur and sagittal saw.  I used the appropriately sized template and drilled the three lug holes.  I then put the trial in place and measured the thickness with the calipers to ensure recreation of the native thickness.  The trial was then removed and the patella subluxed and the knee brought into flexion.  A homan retractor was place to retract and protect the patella and lateral structures.  A Z-retractor was place medially to protect the medial structures.  The extra-medullary alignment system was used to make cut the tibial articular surface perpendicular to the anamotic axis of the tibia and in 3 degrees of posterior slope.  The cut surface and alignment jig was removed.  I then used the intramedullary alignment guide to make a 6 valgus cut on the distal femur.  I then marked out the epicondylar axis on the distal femur.  The posterior condylar axis measured 5 degrees.  I then used the anterior referencing sizer and measured the femur to be a size 8.  The 4-In-1 cutting block was screwed into place in external rotation matching the posterior condylar angle, making our cuts perpendicular to the epicondylar axis.  Anterior, posterior and chamfer cuts were made with the sagittal saw.  The cutting block and cut pieces were removed.  A  lamina spreader was placed in 90 degrees of flexion.  The ACL, PCL, menisci, and posterior condylar osteophytes were removed.  A 16 mm spacer blocked was found to offer good flexion and extension gap balance after mild in degree releasing.   The scoop retractor was then placed and the femoral finishing block was pinned in place.  The small sagittal saw was used as well as the lug drill to finish the femur.  The block  and cut surfaces were removed and the medullary canal hole filled with autograft bone from the cut pieces.  The tibia was delivered forward in deep flexion and external rotation.  A size C tray was selected and pinned into place centered on the medial 1/3 of the tibial tubercle.  The reamer and keel was used to prepare the tibia through the tray.    I then trialed with the size 8 femur, size C tibia, a 16 mm insert and the 32 patella.  I had excellent flexion/extension gap balance, excellent patella tracking.  Flexion was full and beyond 120 degrees; extension was zero.  These components were chosen and the staff opened them to me on the back table while the knee was lavaged copiously and the cement mixed.  The soft tissue was infiltrated with 60cc of exparel 1.3% through a 21 gauge needle.  I cemented in the components and removed all excess cement.  The polyethylene tibial component was snapped into place and the knee placed in extension while cement was hardening.  The capsule was infilltrated with 30cc of .25% Marcaine with epinephrine.  A hemovac was place in the joint exiting superolaterally.  A pain pump was place superomedially superficial to the arthrotomy.  Once the cement was hard, the tourniquet was let down.  Hemostasis was obtained.  The arthrotomy was closed with figure-8 #1 vicryl sutures.  The deep soft tissues were closed with #0 vicryls and the subcuticular layer closed with a running #2-0 vicryl.  The skin was reapproximated and closed with skin staples.  The wound was dressed with xeroform, 4 x4's, 2 ABD sponges, a single layer of webril and a TED stocking.   The patient was then awakened, extubated, and taken to the recovery room in stable condition.  BLOOD LOSS:  300cc DRAINS: 1 hemovac, 1 pain catheter COMPLICATIONS:  None.  PLAN OF CARE: Admit to inpatient   PATIENT DISPOSITION:  PACU - hemodynamically stable.   Delay start of Pharmacological VTE agent (>24hrs) due to  surgical blood loss or risk of bleeding:  not applicable  Please fax a copy of this op note to my office at [phone] (please only include page 1 and 2 of the Case Information op note)

## 2014-02-12 NOTE — Progress Notes (Signed)
Orthopedic Tech Progress Note Patient Details:  Jennifer BurgerHazel W [name] [facility] 01/29/1951 [id number] Applied CPM to RLE.  Applied OHF with trapeze to pt.'s bed.  Left Bone Foam with pt.'s nurse. CPM Right Knee CPM Right Knee: On Right Knee Flexion (Degrees): 90 Right Knee Extension (Degrees): 0   [name], [name] 02/12/2014, 11:57 AM

## 2014-02-12 NOTE — H&P (Signed)
Jennifer RoachHazel W [name] MRN:  [id number] DOB/SEX:  1951/04/15/female  CHIEF COMPLAINT:  Painful right Knee  HISTORY: Patient is a 63 y.o. female presented with a history of pain in the right knee. Onset of symptoms was gradual starting several years ago with gradually worsening course since that time. Prior procedures on the knee include arthroscopy. Patient has been treated conservatively with over-the-counter NSAIDs and activity modification. Patient currently rates pain in the knee at 10 out of 10 with activity. There is pain at night.  PAST MEDICAL HISTORY: There are no active problems to display for this patient.  Past Medical History  Diagnosis Date  . Hypertension    Past Surgical History  Procedure Laterality Date  . Lung biopsy      RIGHT 30 YRS AGO   . Knee arthroscopy      BIL   2010   . Abdominal hysterectomy       MEDICATIONS:   Prescriptions prior to admission  Medication Sig Dispense Refill  . acetaminophen (TYLENOL) 500 MG tablet Take 1,000 mg by mouth every 6 (six) hours as needed for mild pain or moderate pain.      Marland Kitchen. atenolol (TENORMIN) 50 MG tablet Take 50 mg by mouth daily.      Marland Kitchen. ibuprofen (ADVIL,MOTRIN) 600 MG tablet Take 600 mg by mouth every 8 (eight) hours as needed for mild pain.      . Naproxen Sodium (ALEVE) 220 MG CAPS Take 220 mg by mouth 2 (two) times daily as needed.      . triamterene-hydrochlorothiazide (MAXZIDE-25) 37.5-25 MG per tablet Take 1 tablet by mouth daily.        ALLERGIES:  No Known Allergies  REVIEW OF SYSTEMS:  A comprehensive review of systems was negative.   FAMILY HISTORY:  History reviewed. No pertinent family history.  SOCIAL HISTORY:   History  Substance Use Topics  . Smoking status: Current Every Day Smoker -- 0.50 packs/day for 20 years  . Smokeless tobacco: Not on file  . Alcohol Use: Yes     Comment: OCC WINE      EXAMINATION:  Vital signs in last 24 hours: Temp:  [98.3 F (36.8 C)] 98.3 F (36.8 C) (10/26  0554) Pulse Rate:  [66] 66 (10/26 0554) Resp:  [18] 18 (10/26 0554) BP: (135)/(78) 135/78 mmHg (10/26 0554) SpO2:  [99 %] 99 % (10/26 0554)  General appearance: alert, cooperative and no distress Lungs: clear to auscultation bilaterally Heart: regular rate and rhythm, S1, S2 normal, no murmur, click, rub or gallop Abdomen: soft, non-tender; bowel sounds normal; no masses,  no organomegaly Extremities: extremities normal, atraumatic, no cyanosis or edema and Homans sign is negative, no sign of DVT Pulses: 2+ and symmetric Lymph nodes: Cervical, supraclavicular, and axillary nodes normal.  Musculoskeletal:  ROM 0-110, Ligaments intact,  Imaging Review Plain radiographs demonstrate severe degenerative joint disease of the right knee. The overall alignment is significant varus. The bone quality appears to be good for age and reported activity level.  Assessment/Plan: End stage arthritis, right knee   The patient history, physical examination and imaging studies are consistent with advanced degenerative joint disease of the right knee. The patient has failed conservative treatment.  The clearance notes were reviewed.  After discussion with the patient it was felt that Total Knee Replacement was indicated. The procedure,  risks, and benefits of total knee arthroplasty were presented and reviewed. The risks including but not limited to aseptic loosening, infection, blood clots, vascular injury, stiffness,  patella tracking problems complications among others were discussed. The patient acknowledged the explanation, agreed to proceed with the plan.  Xaiden Fleig 02/12/2014, 6:36 AM

## 2014-02-12 NOTE — Plan of Care (Signed)
Problem: Consults Goal: Diagnosis- Total Joint Replacement Primary Total Knee Right     

## 2014-02-12 NOTE — Evaluation (Addendum)
Occupational Therapy Evaluation Patient Details Name: Jennifer [name] MRN: [id number] DOB: 17-Oct-1950 Today's Date: 02/12/2014    History of Present Illness Pt admitted for R TKA   Clinical Impression   Pt s/p above. Limited session due to pain and dizziness. Pt independent with ADLs, PTA. Feel pt will benefit from acute OT to increase independence prior to d/c. Plan to practice LB dressing next session.     Follow Up Recommendations  No OT follow up;Supervision/Assistance - 24 hour    Equipment Recommendations  None recommended by OT    Recommendations for Other Services       Precautions / Restrictions Precautions Precautions: Knee Precaution Comments: educated on precautions Restrictions Weight Bearing Restrictions: Yes RLE Weight Bearing: Weight bearing as tolerated      Mobility Bed Mobility Overal bed mobility: Needs Assistance Bed Mobility: Supine to Sit;Sit to Supine     Supine to sit: Min guard Sit to supine: Min assist   General bed mobility comments: assist with RLE.  Transfers Overall transfer level: Needs assistance Equipment used: Rolling walker (2 wheeled) Transfers: Sit to/from Stand Sit to Stand: Mod assist         General transfer comment: Assist to boost. Elevated bed.    Balance                                            ADL Overall ADL's : Needs assistance/impaired                     Lower Body Dressing: Maximal assistance;Sit to/from stand   Toilet Transfer: RW;Moderate assistance (sit to stand from elevated bed)           Functional mobility during ADLs: Rolling walker;Minimal assistance (took small step back to bed-Min A. ) General ADL Comments: Educated on safety tips for home (safe shoewear, rugs, use of bag on walker). Educated on dressing technique and explained AE is available if pt would need it for ADLs. Explained use of reacher. Explained purpose of footsie roll. Education provided to  pt/family.     Vision                     Perception     Praxis      Pertinent Vitals/Pain Pain Assessment: 0-10 Pain Score: 10-Worst pain ever Pain Location: right knee Pain Intervention(s): Limited activity within patient's tolerance;Repositioned     Hand Dominance     Extremity/Trunk Assessment Upper Extremity Assessment Upper Extremity Assessment: Overall WFL for tasks assessed   Lower Extremity Assessment Lower Extremity Assessment: Defer to PT evaluation RLE Deficits / Details: limited by post op pain   Cervical / Trunk Assessment Cervical / Trunk Assessment: Normal   Communication Communication Communication: No difficulties   Cognition Arousal/Alertness: Lethargic-suspect due to meds Behavior During Therapy: WFL for tasks assessed/performed Overall Cognitive Status: Within Functional Limits for tasks assessed                     General Comments       Exercises       Shoulder Instructions      Home Living Family/patient expects to be discharged to:: Private residence Living Arrangements: Spouse/significant other Available Help at Discharge: Family;Available 24 hours/day Type of Home: House Home Access: Stairs to enter Entergy CorporationEntrance Stairs-Number of Steps: 1   Home Layout: One  level     Bathroom Shower/Tub: Chief Strategy OfficerTub/shower unit   Bathroom Toilet: Handicapped height     Home Equipment: Environmental consultantWalker - 2 wheels;Bedside commode          Prior Functioning/Environment Level of Independence: Independent             OT Diagnosis: Acute pain   OT Problem List: Decreased strength;Decreased range of motion;Decreased activity tolerance;Decreased knowledge of use of DME or AE;Decreased knowledge of precautions;Pain   OT Treatment/Interventions: Self-care/ADL training;DME and/or AE instruction;Therapeutic activities;Patient/family education;Balance training    OT Goals(Current goals can be found in the care plan section) Acute Rehab OT  Goals Patient Stated Goal: not stated OT Goal Formulation: With patient Time For Goal Achievement: 02/19/14 Potential to Achieve Goals: Good ADL Goals Pt Will Perform Lower Body Dressing: with modified independence;sit to/from stand Pt Will Transfer to Toilet: with modified independence;ambulating (3 in 1 over commode) Pt Will Perform Toileting - Clothing Manipulation and hygiene: with modified independence;sit to/from stand Pt Will Perform Tub/Shower Transfer: Tub transfer;with supervision;ambulating;3 in 1;rolling walker  OT Frequency: Min 2X/week   Barriers to D/C:            Co-evaluation              End of Session Equipment Utilized During Treatment: Gait belt;Rolling walker CPM Right Knee CPM Right Knee: Off   Activity Tolerance: Patient limited by pain;Other (comment) (dizziness) Patient left: in bed;with call bell/phone within reach;with family/visitor present   Time: [id number] OT Time Calculation (min): 15 min Charges:  OT General Charges $OT Visit: 1 Procedure OT Evaluation $Initial OT Evaluation Tier I: 1 Procedure G-CodesEarlie Raveling:    Bessy Reaney L OTR/L [phone] 02/12/2014, 5:49 PM

## 2014-02-12 NOTE — Anesthesia Preprocedure Evaluation (Addendum)
Anesthesia Evaluation  Patient identified by MRN, date of birth, ID band Patient awake    Reviewed: Allergy & Precautions, H&P , NPO status , Patient's Chart, lab work & pertinent test results, reviewed documented beta blocker date and time   Airway Mallampati: III TM Distance: >3 FB Neck ROM: Full    Dental  (+) Teeth Intact, Missing, Dental Advisory Given   Pulmonary Current Smoker,  breath sounds clear to auscultation        Cardiovascular hypertension, Pt. on home beta blockers Rhythm:Regular Rate:Normal     Neuro/Psych negative neurological ROS  negative psych ROS   GI/Hepatic negative GI ROS, Neg liver ROS,   Endo/Other  Morbid obesity  Renal/GU negative Renal ROS     Musculoskeletal   Abdominal   Peds  Hematology negative hematology ROS (+)   Anesthesia Other Findings   Reproductive/Obstetrics                         Anesthesia Physical Anesthesia Plan  ASA: II  Anesthesia Plan: Spinal   Post-op Pain Management:    Induction: Intravenous  Airway Management Planned: Simple Face Mask and Natural Airway  Additional Equipment:   Intra-op Plan:   Post-operative Plan:   Informed Consent: I have reviewed the patients History and Physical, chart, labs and discussed the procedure including the risks, benefits and alternatives for the proposed anesthesia with the patient or authorized representative who has indicated his/her understanding and acceptance.   Dental advisory given  Plan Discussed with: CRNA and Surgeon  Anesthesia Plan Comments:        Anesthesia Quick Evaluation

## 2014-02-12 NOTE — Transfer of Care (Signed)
Immediate Anesthesia Transfer of Care Note  Patient: Jennifer BurgerHazel W [name]  Procedure(s) Performed: Procedure(s): TOTAL KNEE ARTHROPLASTY (Right)  Patient Location: PACU  Anesthesia Type:Spinal  Level of Consciousness: awake, alert  and oriented  Airway & Oxygen Therapy: Patient Spontanous Breathing and Patient connected to nasal cannula oxygen  Post-op Assessment: Report given to PACU RN  Post vital signs: Reviewed and stable  Complications: No apparent anesthesia complications

## 2014-02-12 NOTE — Anesthesia Procedure Notes (Addendum)
Spinal  Patient location during procedure: OR Start time: 02/12/2014 7:35 AM End time: 02/12/2014 7:42 AM Staffing Anesthesiologist: Suzette Battiest E Performed by: anesthesiologist  Preanesthetic Checklist Completed: patient identified, site marked, surgical consent, pre-op evaluation, timeout performed, IV checked, risks and benefits discussed and monitors and equipment checked Spinal Block Patient position: sitting Prep: Betadine Patient monitoring: heart rate, continuous pulse ox and blood pressure Location: L4-5 Injection technique: single-shot Needle Needle type: Whitacre  Needle gauge: 22 G Needle length: 9 cm Additional Notes Expiration date of kit checked and confirmed. Patient tolerated procedure well, without complications.    Procedure Name: MAC Date/Time: 02/12/2014 7:45 AM Performed by: Barrington Ellison Pre-anesthesia Checklist: Patient identified, Emergency Drugs available, Suction available, Patient being monitored and Timeout performed Patient Re-evaluated:Patient Re-evaluated prior to inductionOxygen Delivery Method: Nasal cannula

## 2014-02-13 ENCOUNTER — Encounter (HOSPITAL_COMMUNITY): Payer: Self-pay | Admitting: Orthopedic Surgery

## 2014-02-13 LAB — CBC
HCT: 37 % (ref 36.0–46.0)
Hemoglobin: 12.1 g/dL (ref 12.0–15.0)
MCH: 28.7 pg (ref 26.0–34.0)
MCHC: 32.7 g/dL (ref 30.0–36.0)
MCV: 87.7 fL (ref 78.0–100.0)
PLATELETS: 223 10*3/uL (ref 150–400)
RBC: 4.22 MIL/uL (ref 3.87–5.11)
RDW: 14.2 % (ref 11.5–15.5)
WBC: 9 10*3/uL (ref 4.0–10.5)

## 2014-02-13 LAB — BASIC METABOLIC PANEL
Anion gap: 12 (ref 5–15)
BUN: 10 mg/dL (ref 6–23)
CHLORIDE: 98 meq/L (ref 96–112)
CO2: 25 mEq/L (ref 19–32)
CREATININE: 0.57 mg/dL (ref 0.50–1.10)
Calcium: 9 mg/dL (ref 8.4–10.5)
GFR calc Af Amer: >90 mL/min (ref >90)
GFR calc non Af Amer: >90 mL/min (ref >90)
Glucose, Bld: 123 mg/dL — ABNORMAL HIGH (ref 70–99)
Potassium: 3.6 mEq/L — ABNORMAL LOW (ref 3.7–5.3)
SODIUM: 135 meq/L — AB (ref 137–147)

## 2014-02-13 MED ORDER — METHOCARBAMOL 500 MG PO TABS: 500.0000 mg | ORAL_TABLET | Freq: Four times a day (QID) | ORAL | Status: DC | PRN

## 2014-02-13 MED ORDER — ENOXAPARIN SODIUM 40 MG/0.4ML ~~LOC~~ SOLN: 40.0000 mg | SUBCUTANEOUS | Status: DC

## 2014-02-13 MED ORDER — OXYCODONE HCL 5 MG PO TABS: 5.0000 mg | ORAL_TABLET | ORAL | Status: DC | PRN

## 2014-02-13 MED ORDER — OXYCODONE HCL ER 10 MG PO T12A: 10.0000 mg | EXTENDED_RELEASE_TABLET | Freq: Two times a day (BID) | ORAL | Status: DC

## 2014-02-13 NOTE — Discharge Instructions (Signed)
Diet: As you were doing prior to hospitalization  ° °Activity:  Increase activity slowly as tolerated  °                No lifting or driving for 6 weeks ° °Shower:  May shower without a dressing once there is no drainage from your wound. °                Do NOT wash over the wound. °                °Dressing:  You may change your dressing on Wednesday °                   Then change the dressing daily with sterile 4"x4"s gauze dressing  °                   And TED hose for knees. ° °Weight Bearing:  Weight bearing as tolerated as taught in physical therapy.  Use a                                walker or Crutches as instructed. ° °To prevent constipation: you may use a stool softener such as - °              Colace ( over the counter) 100 mg by mouth twice a day  °              Drink plenty of fluids ( prune juice may be helpful) and high fiber foods °               Miralax ( over the counter) for constipation as needed.   ° °Precautions:  If you experience chest pain or shortness of breath - call 911 immediately               For transfer to the hospital emergency department!! °              If you develop a fever greater that 101 F, purulent drainage from wound,                             increased redness or drainage from wound, or calf pain -- Call the office. ° °Follow- Up Appointment:  Please call for an appointment to be seen on 02/27/14 °                                             [place] office:  [phone] °           [address] [place], [place] [address] °               ° ° °

## 2014-02-13 NOTE — Progress Notes (Signed)
Sherryll BurgerHazel W Paddack to be D/C'd Home per MD order. Discussed with the patient and all questions fully answered.    Medication List         acetaminophen 500 MG tablet  Commonly known as:  TYLENOL  Take 1,000 mg by mouth every 6 (six) hours as needed for mild pain or moderate pain.     ALEVE 220 MG Caps  Generic drug:  Naproxen Sodium  Take 220 mg by mouth 2 (two) times daily as needed.     atenolol 50 MG tablet  Commonly known as:  TENORMIN  Take 50 mg by mouth daily.     enoxaparin 40 MG/0.4ML injection  Commonly known as:  LOVENOX  Inject 0.4 mLs (40 mg total) into the skin daily.     ibuprofen 600 MG tablet  Commonly known as:  ADVIL,MOTRIN  Take 600 mg by mouth every 8 (eight) hours as needed for mild pain.     methocarbamol 500 MG tablet  Commonly known as:  ROBAXIN  Take 1-2 tablets (500-1,000 mg total) by mouth every 6 (six) hours as needed for muscle spasms.     oxyCODONE 5 MG immediate release tablet  Commonly known as:  Oxy IR/ROXICODONE  Take 1-2 tablets (5-10 mg total) by mouth every 3 (three) hours as needed for breakthrough pain.     OxyCODONE 10 mg T12a 12 hr tablet  Commonly known as:  OXYCONTIN  Take 1 tablet (10 mg total) by mouth every 12 (twelve) hours.     triamterene-hydrochlorothiazide 37.5-25 MG per tablet  Commonly known as:  MAXZIDE-25  Take 1 tablet by mouth daily.        VVS, Skin clean, dry and intact without evidence of skin break down, no evidence of skin tears noted.  IV catheter discontinued intact. Site without signs and symptoms of complications. Dressing and pressure applied.  An After Visit Summary was printed and given to the patient.  Patient escorted via WC, and D/C home via private auto.  Kai LevinsJacobs, Hadden Steig N  02/13/2014 6:57 PM

## 2014-02-13 NOTE — Progress Notes (Signed)
Occupational Therapy Treatment Patient Details Name: Leighton RoachHazel W Atkerson MRN: [id number] DOB: 1950/12/19 Today's Date: 02/13/2014    History of present illness Pt admitted for R TKA   OT comments  Pt. Progressing well with mobility and stand pivot transfers for toileting needs.  Declines A/E for LB dressing stating she has family to assist at d/c.  Pt. Also requests to defer tub transfer until she has increased knee flexion.  Will sponge bathe for now.  Will follow along while pt. Here for acute OT needs for increasing independence and safety with ADLS prior to d/c home.     Follow Up Recommendations  No OT follow up;Supervision/Assistance - 24 hour                 Precautions / Restrictions Restrictions RLE Weight Bearing: Weight bearing as tolerated       Mobility Bed Mobility               General bed mobility comments: on bsc upon arrival into room  Transfers Overall transfer level: Needs assistance Equipment used: Rolling walker (2 wheeled) Transfers: Sit to/from UGI CorporationStand;Stand Pivot Transfers Sit to Stand: Min assist Stand pivot transfers: Min assist       General transfer comment: cues for sequencing and walker management during pivotal steps                                       ADL Overall ADL's : Needs assistance/impaired                     Lower Body Dressing: Maximal assistance;Sit to/from stand;Sitting/lateral leans Lower Body Dressing Details (indicate cue type and reason): able to reach down to LLE but not completely to RLE, declined need for A/E and states "she has family for all that" in reference to don/doff clothing over B LES Toilet Transfer: Minimal assistance;BSC;Ambulation;Stand-pivot;Cueing for sequencing;RW Toilet Transfer Details (indicate cue type and reason): cues for hand placement for controlled descend and also for pivotal steps with RW Toileting- Clothing Manipulation and Hygiene: Min guard;Sit to/from Event organiserstand      Tub/Shower Transfer Details (indicate cue type and reason): pt. declines at this time stating she will do sponge bathing until she has increased knee flexion to to aide in stepping over the tub Functional mobility during ADLs: Minimal assistance;Rolling walker General ADL Comments: eduated on /AE options, she declines stating family to assist, also declined need for tub transfer practice at this time.  able to complete toileiting tasks. states pain is well managed                                      Cognition   Behavior During Therapy: WFL for tasks assessed/performed Overall Cognitive Status: Within Functional Limits for tasks assessed                                                        Pertinent Vitals/ Pain       Pain Assessment: 0-10 Pain Score: 2  Pain Location: right knee Pain Descriptors / Indicators: Aching Pain Intervention(s): Repositioned  Home Living  Prior Functioning/Environment              Frequency Min 2X/week     Progress Toward Goals  OT Goals(current goals can now be found in the care plan section)  Progress towards OT goals: Progressing toward goals     Plan Discharge plan remains appropriate    Co-evaluation                 End of Session Equipment Utilized During Treatment: Rolling walker   Activity Tolerance Patient tolerated treatment well   Patient Left in chair;with call bell/phone within reach   Nurse Communication          Time: [id number] OT Time Calculation (min): 28 min  Charges: OT General Charges $OT Visit: 1 Procedure OT Treatments $Self Care/Home Management : 23-37 mins  Robet LeuMorris, Neira Bentsen Lorraine, COTA/L 02/13/2014, 8:41 AM

## 2014-02-13 NOTE — Care Management Note (Signed)
CARE MANAGEMENT NOTE 02/13/2014  Patient:  Jennifer [name],Jennifer [name]   Account Number:  [id number]  Date Initiated:  02/13/2014  Documentation initiated by:  Vance PeperBRADY,Evalin Shawhan  Subjective/Objective Assessment:   63 yr old female admitted with osteoarthritis of right knee, 02/12/14 patient had a right total knee arthroplasty.     Action/Plan:   Case manager spoke with patient concerning home health and DME needs.[facility] offered. [facility] who had no record, but accepted patient. patient has family support at D/C.   Anticipated DC Date:  02/14/2014   Anticipated DC Plan:  [name]      DC Planning Services  CM consult      [facility]  [facility]  DURABLE MEDICAL EQUIPMENT   Choice offered to / List presented to:  C-1 Patient   DME arranged  [facility]  3-N-1  CP[facility]      DME agency  [employer]     HH arranged  HH-2 PT      HH agency  [facility]   Status of service:  Completed, signed off Medicare Important Message given?   (If response is "NO", the following Medicare IM given date fields will be blank) Date Medicare IM given:   Medicare IM given by:   Date Additional Medicare IM given:   Additional Medicare IM given by:    Discharge Disposition:  [name]  Per UR Regulation:  Reviewed for med. necessity/level of care/duration of stay  If discussed at Long Length of Stay Meetings, dates discussed:    Comments:

## 2014-02-13 NOTE — Progress Notes (Signed)
Physical Therapy Treatment Patient Details Name: Jennifer [name] MRN: [id number] DOB: 06-Nov-1950 Today's Date: 02/13/2014    History of Present Illness Pt admitted for R TKA    PT Comments    Pt is seen for AM visit with new TKA looking stiff and painful.  Has tolerated longer gait today and is in need of stair training for home if leaving today.  No word from nursing that she is actually leaving to go home.  Plan is appropriate.  Follow Up Recommendations  Home health PT;Supervision for mobility/OOB     Equipment Recommendations  None recommended by PT    Recommendations for Other Services       Precautions / Restrictions Precautions Precautions: Knee Precaution Booklet Issued: Yes (comment) Precaution Comments: educated on precautions Restrictions Weight Bearing Restrictions: Yes RLE Weight Bearing: Weight bearing as tolerated    Mobility  Bed Mobility               General bed mobility comments: in chair on arrival  Transfers Overall transfer level: Needs assistance Equipment used: Rolling walker (2 wheeled) Transfers: Sit to/from BJ'sStand;Stand Pivot Transfers Sit to Stand: Min assist Stand pivot transfers: Min assist       General transfer comment: cues for sequencing and walker management during pivotal steps  Ambulation/Gait Ambulation/Gait assistance: Min guard Ambulation Distance (Feet): 60 Feet Assistive device: Rolling walker (2 wheeled) Gait Pattern/deviations: Step-through pattern;Decreased stance time - right;Decreased dorsiflexion - right;Decreased dorsiflexion - left;Wide base of support Gait velocity: slow Gait velocity interpretation: Below normal speed for age/gender General Gait Details: to BR then to hall and back to chair   Stairs            Wheelchair Mobility    Modified Rankin (Stroke Patients Only)       Balance Overall balance assessment: Needs assistance Sitting-balance support: Feet supported Sitting  balance-Leahy Scale: Good   Postural control: Other (comment) (flexed on walker) Standing balance support: Bilateral upper extremity supported Standing balance-Leahy Scale: Fair Standing balance comment: supervision for dynamic standing                    Cognition Arousal/Alertness: Awake/alert Behavior During Therapy: WFL for tasks assessed/performed Overall Cognitive Status: Within Functional Limits for tasks assessed                      Exercises Total Joint Exercises Ankle Circles/Pumps: AROM;Both;10 reps Quad Sets: AROM;Both;10 reps Gluteal Sets: AROM;Both;10 reps Heel Slides: AAROM;Right;5 reps Hip ABduction/ADduction: AROM;Both;10 reps;AAROM    General Comments General comments (skin integrity, edema, etc.): beginning to increase mobiltiy with pt under the impression she goes home today.  Substantial pain still an issue      Pertinent Vitals/Pain Pain Assessment: 0-10 Pain Score: 8  Pain Location: R knee Pain Descriptors / Indicators: Aching Pain Intervention(s): Limited activity within patient's tolerance;Monitored during session;Premedicated before session;Repositioned;Ice applied BP was 117/57, pulse 77 and O2 sat 96% per nsg reports.    Home Living                      Prior Function            PT Goals (current goals can now be found in the care plan section) Acute Rehab PT Goals Patient Stated Goal: to walk and hurt less Progress towards PT goals: Progressing toward goals    Frequency  7X/week    PT Plan Current plan remains appropriate  Co-evaluation             End of Session Equipment Utilized During Treatment: Gait belt Activity Tolerance: Patient tolerated treatment well Patient left: in chair;with call bell/phone within reach     Time: 1191-47821045-1114 PT Time Calculation (min): 29 min  Charges:  $Gait Training: 8-22 mins $Therapeutic Exercise: 8-22 mins                    G Codes:      Ivar DrapeStout, Oletha Tolson  E 02/13/2014, 11:21 AM Samul Dadauth Kevyn Wengert, PT MS Acute Rehab Dept. Number: [phone]

## 2014-02-13 NOTE — Progress Notes (Signed)
SPORTS MEDICINE AND JOINT REPLACEMENT  Georgena SpurlingStephen Lucey, MD   Altamese CabalMaurice Aalijah Mims, PA-C [address], [place], [place]  [address]                             [phone]   PROGRESS NOTE  Subjective:  negative for Chest Pain  negative for Shortness of Breath  negative for Nausea/Vomiting   negative for Calf Pain  negative for Bowel Movement   Tolerating Diet: yes         Patient reports pain as 6 on 0-10 scale.    Objective: Vital signs in last 24 hours:   Patient Vitals for the past 24 hrs:  BP Temp Pulse Resp SpO2  02/13/14 1222 122/70 mmHg 98.2 F (36.8 C) 72 18 95 %  02/13/14 0621 117/57 mmHg 98.6 F (37 C) 77 16 96 %  02/13/14 0400 - - - 16 95 %  02/13/14 0022 123/72 mmHg 97.8 F (36.6 C) 62 16 94 %  02/13/14 0000 - - - 16 95 %  02/12/14 2000 - - - 16 95 %  02/12/14 1936 117/61 mmHg 97.9 F (36.6 C) 58 16 91 %    @flow {1959:LAST@   Intake/Output from previous day:   10/26 0701 - 10/27 0700 In: 2756.3 [P.O.:600; I.V.:2056.3] Out: 550 [Drains:400]   Intake/Output this shift:   10/27 0701 - 10/27 1900 In: 360 [P.O.:360] Out: -    Intake/Output     10/26 0701 - 10/27 0700 10/27 0701 - 10/28 0700   P.O. 600 360   I.V. (mL/kg) 2056.3 (19.8)    IV Piggyback 100    Total Intake(mL/kg) 2756.3 (26.5) 360 (3.5)   Drains 400    Blood 150    Total Output 550     Net +2206.3 +360        Urine Occurrence 4 x 1 x      LABORATORY DATA:  Recent Labs  02/12/14 1242 02/13/14 0553  WBC 7.0 9.0  HGB 12.6 12.1  HCT 38.4 37.0  PLT 215 223    Recent Labs  02/12/14 1242 02/13/14 0553  NA  --  135*  K  --  3.6*  CL  --  98  CO2  --  25  BUN  --  10  CREATININE 0.58 0.57  GLUCOSE  --  123*  CALCIUM  --  9.0   Lab Results  Component Value Date   INR 1.03 02/02/2014   INR 1.03 05/13/2010    Examination:  General appearance: alert, cooperative and no distress Extremities: extremities normal, atraumatic, no cyanosis or edema and Homans sign is  negative, no sign of DVT  Wound Exam: clean, dry, intact   Drainage:  None: wound tissue dry  Motor Exam: EHL and FHL Intact  Sensory Exam: Deep Peroneal normal   Assessment:    1 Day Post-Op  Procedure(s) (LRB): TOTAL KNEE ARTHROPLASTY (Right)  ADDITIONAL DIAGNOSIS:  Active Problems:   S/P total knee arthroplasty  Acute Blood Loss Anemia   Plan: Physical Therapy as ordered Weight Bearing as Tolerated (WBAT)  DVT Prophylaxis:  Lovenox  DISCHARGE PLAN: Home  DISCHARGE NEEDS: HHPT, CPM, Walker and 3-in-1 comode seat         Jennifer [name] 02/13/2014, 1:22 PM

## 2014-02-21 NOTE — Discharge Summary (Signed)
SPORTS MEDICINE & JOINT REPLACEMENT   Georgena SpurlingStephen Lucey, MD   Altamese CabalMaurice Katai Marsico, PA-C [address], [place], [place]  [address]                             [phone]  PATIENT ID: Jennifer RoachHazel W Pancake        MRN:  [id number]          DOB/AGE: 11-27-1950 / 63 y.o.    DISCHARGE SUMMARY  ADMISSION DATE:    02/12/2014 DISCHARGE DATE:  02/13/2014  ADMISSION DIAGNOSIS: osteoarthritis right knee    DISCHARGE DIAGNOSIS:  osteoarthritis right knee    ADDITIONAL DIAGNOSIS: Active Problems:   S/P total knee arthroplasty  Past Medical History  Diagnosis Date  . Hypertension     PROCEDURE: Procedure(s): TOTAL KNEE ARTHROPLASTY on 02/12/2014  CONSULTS:     HISTORY:  See H&P in chart  HOSPITAL COURSE:  Jennifer RoachHazel W Lycan is a 63 y.o. admitted on 02/12/2014 and found to have a diagnosis of osteoarthritis right knee.  After appropriate laboratory studies were obtained  they were taken to the operating room on 02/12/2014 and underwent Procedure(s): TOTAL KNEE ARTHROPLASTY.   They were given perioperative antibiotics:  Anti-infectives    Start     Dose/Rate Route Frequency Ordered Stop   02/12/14 1200  ceFAZolin (ANCEF) IVPB 2 g/50 mL premix     2 g100 mL/hr over 30 Minutes Intravenous Every 6 hours 02/12/14 1152 02/12/14 1902   02/12/14 0600  ceFAZolin (ANCEF) IVPB 2 g/50 mL premix     2 g100 mL/hr over 30 Minutes Intravenous On call to O.R. 02/11/14 1232 02/12/14 0745    .  Tolerated the procedure well.  Placed with a foley intraoperatively.  Given Ofirmev at induction and for 48 hours.    POD# 1: Vital signs were stable.  Patient denied Chest pain, shortness of breath, or calf pain.  Patient was started on Lovenox 30 mg subcutaneously twice daily at 8am.  Consults to PT, OT, and care management were made.  The patient was weight bearing as tolerated.  CPM was placed on the operative leg 0-90 degrees for 6-8 hours a day.  Incentive spirometry was taught.  Dressing was changed.  Hemovac  was discontinued.      POD #2, Continued  PT for ambulation and exercise program.  IV saline locked.  O2 discontinued.    The remainder of the hospital course was dedicated to ambulation and strengthening.   The patient was discharged on 1 day post op in  Good condition.  Blood products given:none  DIAGNOSTIC STUDIES: Recent vital signs: No data found.      Recent laboratory studies: No results for input(s): WBC, HGB, HCT, PLT in the last 168 hours. No results for input(s): NA, K, CL, CO2, BUN, CREATININE, GLUCOSE, CALCIUM in the last 168 hours. Lab Results  Component Value Date   INR 1.03 02/02/2014   INR 1.03 05/13/2010     Recent Radiographic Studies :  Dg Chest 2 View  02/02/2014   CLINICAL DATA:  Preoperative total knee arthroplasty; hypertension  EXAM: CHEST  2 VIEW  COMPARISON:  None.  FINDINGS: There is scarring in the lung bases. The interstitium appears mildly prominent in a generalized manner. There is no frank edema or consolidation. The heart is upper normal in size with pulmonary vascularity within normal limits. No adenopathy. There is degenerative change in the thoracic spine.  IMPRESSION: Generalized interstitial  prominence. 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 No frank edema or consolidation. Mild scarring in the bases.   Electronically Signed   By: Bretta BangWilliam  Woodruff M.D.   On: 02/02/2014 12:38    DISCHARGE INSTRUCTIONS: Discharge Instructions    CPM    Complete by:  As directed   Continuous passive motion machine (CPM):      Use the CPM from 0 to 90 for 6-8 hours per day.      You may increase by 10 per day.  You may break it up into 2 or 3 sessions per day.      Use CPM for 2 weeks or until you are told to stop.     Call MD / Call 911    Complete by:  As directed   If you experience chest pain or shortness of breath, CALL 911 and be transported to the hospital emergency room.  If  you develope a fever above 101 F, pus (white drainage) or increased drainage or redness at the wound, or calf pain, call your surgeon's office.     Change dressing    Complete by:  As directed   Change dressing on wednesday, then change the dressing daily with sterile 4 x 4 inch gauze dressing and apply TED hose.     Constipation Prevention    Complete by:  As directed   Drink plenty of fluids.  Prune juice may be helpful.  You may use a stool softener, such as Colace (over the counter) 100 mg twice a day.  Use MiraLax (over the counter) for constipation as needed.     Diet - low sodium heart healthy    Complete by:  As directed      Do not put a pillow under the knee. Place it under the heel.    Complete by:  As directed      Driving restrictions    Complete by:  As directed   No driving for 6 weeks     Increase activity slowly as tolerated    Complete by:  As directed      Lifting restrictions    Complete by:  As directed   No lifting for 6 weeks     TED hose    Complete by:  As directed   Use stockings (TED hose) for 3 weeks on both leg(s).  You may remove them at night for sleeping.           DISCHARGE MEDICATIONS:     Medication List    TAKE these medications        acetaminophen 500 MG tablet  Commonly known as:  TYLENOL  Take 1,000 mg by mouth every 6 (six) hours as needed for mild pain or moderate pain.     ALEVE 220 MG Caps  Generic drug:  Naproxen Sodium  Take 220 mg by mouth 2 (two) times daily as needed.     atenolol 50 MG tablet  Commonly known as:  TENORMIN  Take 50 mg by mouth daily.     enoxaparin 40 MG/0.4ML injection  Commonly known as:  LOVENOX  Inject 0.4 mLs (40 mg total) into the skin daily.     ibuprofen 600 MG tablet  Commonly known as:  ADVIL,MOTRIN  Take 600 mg by mouth every 8 (eight) hours as needed for mild pain.     methocarbamol 500 MG tablet  Commonly known as:  ROBAXIN  Take 1-2 tablets (  500-1,000 mg total) by mouth every 6 (six)  hours as needed for muscle spasms.     oxyCODONE 5 MG immediate release tablet  Commonly known as:  Oxy IR/ROXICODONE  Take 1-2 tablets (5-10 mg total) by mouth every 3 (three) hours as needed for breakthrough pain.     OxyCODONE 10 mg T12a 12 hr tablet  Commonly known as:  OXYCONTIN  Take 1 tablet (10 mg total) by mouth every 12 (twelve) hours.     triamterene-hydrochlorothiazide 37.5-25 MG per tablet  Commonly known as:  MAXZIDE-25  Take 1 tablet by mouth daily.        FOLLOW UP VISIT:       Follow-up Information    Follow up with [facility].   Why:  Someone from [facility] will contact you concerning start date and time for physical therapy.   Contact information:   [address] [place] [address] [phone]       Follow up with Raymon Mutton, MD. Call on 02/27/2014.   Specialty:  Orthopedic Surgery   Contact information:   [address] [place] [address] [phone]       DISPOSITION: HOME  CONDITION:  Good   Jaycey Gens 02/21/2014, 9:37 PM

## 2015-04-24 MED FILL — ATENOLOL 50 MG TABLET: 50 | 90 days supply | Qty: 90 | Fill #0

## 2015-04-24 MED FILL — TRIAMTERENE/HCTZ 37.5/25 CP: 37.5-25 | 90 days supply | Qty: 90 | Fill #0

## 2015-04-24 MED FILL — FLUTICASONE PROP 50 MCG SPR: 50 | 90 days supply | Qty: 48 | Fill #0

## 2015-06-20 DIAGNOSIS — I10 Essential (primary) hypertension: Secondary | ICD-10-CM | POA: Diagnosis not present

## 2015-06-20 DIAGNOSIS — M1712 Unilateral primary osteoarthritis, left knee: Secondary | ICD-10-CM | POA: Diagnosis not present

## 2015-06-20 DIAGNOSIS — Z0000 Encounter for general adult medical examination without abnormal findings: Secondary | ICD-10-CM | POA: Diagnosis not present

## 2015-06-20 DIAGNOSIS — Z86010 Personal history of colonic polyps: Secondary | ICD-10-CM | POA: Diagnosis not present

## 2015-06-20 MED FILL — IBUPROFEN 800 MG TABLET: 800 | 30 days supply | Qty: 90 | Fill #0

## 2015-07-19 MED FILL — ATORVASTATIN 10 MG TABLET: 10 | 90 days supply | Qty: 90 | Fill #0

## 2015-07-24 MED FILL — ATENOLOL 50 MG TABLET: 50 | 90 days supply | Qty: 90 | Fill #0

## 2015-08-08 MED FILL — IBUPROFEN 800 MG TABLET: 800 | 30 days supply | Qty: 90 | Fill #1

## 2015-08-08 MED FILL — TRIAMTERENE/HCTZ 37.5/25 CP: 37.5-25 | 90 days supply | Qty: 90 | Fill #1

## 2015-09-25 MED FILL — IBUPROFEN 800 MG TABLET: 800 | 30 days supply | Qty: 90 | Fill #2

## 2015-10-29 MED FILL — ATENOLOL 50 MG TABLET: 50 | 90 days supply | Qty: 90 | Fill #1

## 2015-11-13 MED FILL — IBUPROFEN 800 MG TABLET: 800 | 30 days supply | Qty: 90 | Fill #3

## 2015-11-13 MED FILL — TRIAMTERENE/HCTZ 37.5/25 CP: 37.5-25 | 90 days supply | Qty: 90 | Fill #2

## 2016-01-13 MED FILL — ATORVASTATIN 10 MG TABLET: 10 | 90 days supply | Qty: 90 | Fill #1

## 2016-01-13 MED FILL — IBUPROFEN 800 MG TABLET: 800 | 30 days supply | Qty: 90 | Fill #4

## 2016-02-03 MED FILL — ATENOLOL 50 MG TABLET: 50 | 90 days supply | Qty: 90 | Fill #2

## 2016-03-16 MED FILL — TRIAMTERENE/HCTZ 37.5/25 CP: 37.5-25 | 90 days supply | Qty: 90 | Fill #3

## 2016-03-16 MED FILL — IBUPROFEN 800 MG TABLET: 800 | 30 days supply | Qty: 90 | Fill #5

## 2016-03-19 DIAGNOSIS — M76822 Posterior tibial tendinitis, left leg: Secondary | ICD-10-CM | POA: Diagnosis not present

## 2016-03-19 DIAGNOSIS — M79672 Pain in left foot: Secondary | ICD-10-CM | POA: Diagnosis not present

## 2016-03-30 DIAGNOSIS — M722 Plantar fascial fibromatosis: Secondary | ICD-10-CM | POA: Diagnosis not present

## 2016-03-30 DIAGNOSIS — M19072 Primary osteoarthritis, left ankle and foot: Secondary | ICD-10-CM | POA: Diagnosis not present

## 2016-03-30 DIAGNOSIS — M7662 Achilles tendinitis, left leg: Secondary | ICD-10-CM | POA: Diagnosis not present

## 2016-04-03 ENCOUNTER — Ambulatory Visit (INDEPENDENT_AMBULATORY_CARE_PROVIDER_SITE_OTHER): Payer: 59

## 2016-04-03 ENCOUNTER — Ambulatory Visit (INDEPENDENT_AMBULATORY_CARE_PROVIDER_SITE_OTHER): Payer: 59 | Admitting: Podiatry

## 2016-04-03 ENCOUNTER — Encounter: Payer: Self-pay | Admitting: Podiatry

## 2016-04-03 VITALS — BP 121/62 | HR 78 | Resp 16 | Ht 67.0 in | Wt 230.0 lb

## 2016-04-03 DIAGNOSIS — M779 Enthesopathy, unspecified: Secondary | ICD-10-CM | POA: Diagnosis not present

## 2016-04-03 DIAGNOSIS — M79672 Pain in left foot: Secondary | ICD-10-CM

## 2016-04-03 DIAGNOSIS — I999 Unspecified disorder of circulatory system: Secondary | ICD-10-CM

## 2016-04-03 MED ORDER — HYDROCODONE-ACETAMINOPHEN 10-325 MG PO TABS: 1.0000 | ORAL_TABLET | Freq: Three times a day (TID) | ORAL | 0 refills | Status: DC | PRN

## 2016-04-03 MED ORDER — TRIAMCINOLONE ACETONIDE 10 MG/ML IJ SUSP
10.0000 mg | Freq: Once | INTRAMUSCULAR | Status: AC
  Administered 2016-04-03: 10 mg

## 2016-04-03 MED FILL — HYDROCODON-APAP 10-325: 10-325 | 10 days supply | Qty: 30 | Fill #0

## 2016-04-03 NOTE — Progress Notes (Signed)
aart

## 2016-04-07 ENCOUNTER — Other Ambulatory Visit: Payer: Self-pay | Admitting: Podiatry

## 2016-04-07 DIAGNOSIS — I739 Peripheral vascular disease, unspecified: Secondary | ICD-10-CM

## 2016-04-07 DIAGNOSIS — I999 Unspecified disorder of circulatory system: Secondary | ICD-10-CM

## 2016-04-17 ENCOUNTER — Ambulatory Visit: Payer: 59 | Admitting: Podiatry

## 2016-04-20 HISTORY — PX: FOOT SURGERY: SHX648

## 2016-04-29 ENCOUNTER — Ambulatory Visit: Payer: 59

## 2016-04-29 DIAGNOSIS — I739 Peripheral vascular disease, unspecified: Secondary | ICD-10-CM | POA: Diagnosis not present

## 2016-04-29 DIAGNOSIS — R0989 Other specified symptoms and signs involving the circulatory and respiratory systems: Secondary | ICD-10-CM | POA: Diagnosis not present

## 2016-04-30 LAB — VAS US LOWER EXTREMITY ARTERIAL DUPLEX
LEFT POPLITEAL PROX DYS VEL: 26 cm/s
LEFT SFA MID VEL: 19 cm/s
LPOPDISTDYSV: 24 cm/s
LSFAPROXDYS: 15 cm/s
Left ant tibial distal sys: 47 cm/s
Left popliteal dist sys PSV: 78 cm/s
Left popliteal prox sys PSV: 67 cm/s
Left super femoral mid sys PSV: 126 cm/s
Left super femoral prox sys PSV: 102 cm/s
RIGHT ANT DIST TIBAL DIA PSV: 2 cm/s
RIGHT ANT DIST TIBAL SYS PSV: 71 cm/s
RIGHT POPLITEAL DIST EDV: 23 cm/s
RIGHT POST TIB DIST DIA: 25 cm/s
RIGHT POST TIB DIST SYS: 137 cm/s
RIGHT SUPER FEMORAL MID EDV: -20 cm/s
RIGHT SUPER FEMORAL PROX EDV: 14 cm/s
RSFPPSV: 91 cm/s
RTPOPPROXDIA: 16 cm/s
Right popliteal dist sys PSV: 128 cm/s
Right popliteal prox sys PSV: 104 cm/s
Right super femoral mid sys PSV: -136 cm/s
left ant tibial distal dia: 16 cm/s
left post tibial dist dia: 25 cm/s
left post tibial dist sys: 72 cm/s

## 2016-05-12 MED FILL — ATENOLOL 25 MG TABLET: 25 | 30 days supply | Qty: 60 | Fill #0

## 2016-05-13 MED FILL — IBUPROFEN 800 MG TABLET: 800 | 30 days supply | Qty: 90 | Fill #0

## 2016-06-02 MED FILL — PENICILLIN VK 500 MG TABLET: 500 | 7 days supply | Qty: 28 | Fill #0

## 2016-06-02 MED FILL — ACETAMINOPHEN/COD #3 TABLET: 300-30 | 4 days supply | Qty: 12 | Fill #0

## 2016-06-09 MED FILL — ATENOLOL 25 MG TABLET: 25 | 30 days supply | Qty: 60 | Fill #1

## 2016-06-10 MED FILL — PENICILLIN VK 500 MG TABLET: 500 | 7 days supply | Qty: 28 | Fill #0

## 2016-06-10 MED FILL — TRIAMTERENE-HCTZ 37.5-25 MG: 37.5-25 | 90 days supply | Qty: 90 | Fill #0

## 2016-06-10 MED FILL — HYDROCODON-APAP 5-325: 5-325 | 3 days supply | Qty: 12 | Fill #0

## 2016-07-09 MED FILL — IBUPROFEN 800 MG TABLET: 800 | 30 days supply | Qty: 90 | Fill #1

## 2016-07-09 MED FILL — ATENOLOL 25 MG TABLET: 25 | 30 days supply | Qty: 60 | Fill #2

## 2016-07-13 DIAGNOSIS — I10 Essential (primary) hypertension: Secondary | ICD-10-CM | POA: Diagnosis not present

## 2016-07-13 DIAGNOSIS — F17200 Nicotine dependence, unspecified, uncomplicated: Secondary | ICD-10-CM | POA: Diagnosis not present

## 2016-07-13 DIAGNOSIS — Z0000 Encounter for general adult medical examination without abnormal findings: Secondary | ICD-10-CM | POA: Diagnosis not present

## 2016-07-13 DIAGNOSIS — M1712 Unilateral primary osteoarthritis, left knee: Secondary | ICD-10-CM | POA: Diagnosis not present

## 2016-07-13 DIAGNOSIS — Z6838 Body mass index (BMI) 38.0-38.9, adult: Secondary | ICD-10-CM | POA: Diagnosis not present

## 2016-07-13 DIAGNOSIS — Z86010 Personal history of colonic polyps: Secondary | ICD-10-CM | POA: Diagnosis not present

## 2016-07-13 DIAGNOSIS — G5603 Carpal tunnel syndrome, bilateral upper limbs: Secondary | ICD-10-CM | POA: Diagnosis not present

## 2016-07-13 DIAGNOSIS — E782 Mixed hyperlipidemia: Secondary | ICD-10-CM | POA: Diagnosis not present

## 2016-07-16 ENCOUNTER — Encounter (INDEPENDENT_AMBULATORY_CARE_PROVIDER_SITE_OTHER): Payer: Self-pay | Admitting: *Deleted

## 2016-07-23 ENCOUNTER — Other Ambulatory Visit (INDEPENDENT_AMBULATORY_CARE_PROVIDER_SITE_OTHER): Payer: Self-pay | Admitting: *Deleted

## 2016-07-23 ENCOUNTER — Encounter (INDEPENDENT_AMBULATORY_CARE_PROVIDER_SITE_OTHER): Payer: Self-pay | Admitting: *Deleted

## 2016-07-23 DIAGNOSIS — Z86010 Personal history of colonic polyps: Secondary | ICD-10-CM | POA: Insufficient documentation

## 2016-07-28 MED FILL — PROMETHAZINE-DM SYRUP: 6.25-15 | 7 days supply | Qty: 150 | Fill #0

## 2016-08-10 MED FILL — ATENOLOL 25 MG TABLET: 25 | 30 days supply | Qty: 60 | Fill #3

## 2016-09-15 MED FILL — ATENOLOL 25 MG TABLET: 25 | 30 days supply | Qty: 60 | Fill #4

## 2016-09-15 MED FILL — TRIAMTERENE-HCTZ 37.5-25 MG: 37.5-25 | 90 days supply | Qty: 90 | Fill #1

## 2016-09-18 MED FILL — IBUPROFEN 800 MG TAB: 800 | 30 days supply | Qty: 90 | Fill #2

## 2016-10-15 MED FILL — ATENOLOL 25 MG TABLET: 25 | 30 days supply | Qty: 60 | Fill #5

## 2016-10-20 ENCOUNTER — Telehealth (INDEPENDENT_AMBULATORY_CARE_PROVIDER_SITE_OTHER): Payer: Self-pay | Admitting: *Deleted

## 2016-10-20 ENCOUNTER — Encounter (INDEPENDENT_AMBULATORY_CARE_PROVIDER_SITE_OTHER): Payer: Self-pay | Admitting: *Deleted

## 2016-10-20 DIAGNOSIS — Z86010 Personal history of colonic polyps: Secondary | ICD-10-CM

## 2016-10-20 NOTE — Telephone Encounter (Addendum)
Patient needs trilyte -- hx polyps 

## 2016-10-22 MED ORDER — PEG 3350-KCL-NA BICARB-NACL 420 G PO SOLR: 4000.0000 mL | Freq: Once | ORAL | 0 refills | Status: AC

## 2016-10-29 ENCOUNTER — Telehealth (INDEPENDENT_AMBULATORY_CARE_PROVIDER_SITE_OTHER): Payer: Self-pay | Admitting: *Deleted

## 2016-10-29 NOTE — Telephone Encounter (Signed)
Referring MD/PCP: tapper   Procedure: tcs  Reason/Indication:  Hx polyps  Has patient had this procedure before?  Yes, 2009 -- paper chart  If so, when, by whom and where?    Is there a family history of colon cancer?  no  Who?  What age when diagnosed?    Is patient diabetic?   no      Does patient have prosthetic heart valve or mechanical valve?  no  Do you have a pacemaker?  no  Has patient ever had endocarditis? no  Has patient had joint replacement within last 12 months?  no  Does patient tend to be constipated or take laxatives? yes  Does patient have a history of alcohol/drug use?  no  Is patient on Coumadin, Plavix and/or Aspirin? no  Medications: atenolol 25 mg daily, atorvastatin 10 mg daily, triam/hctz 37.5/25 mg daily, ibuprofen 800 mg daily, tylenol prn, milk of magnesium every other day  Allergies: nkda  Medication Adjustment per Dr Karilyn Cotaehman:   Procedure date & time: 11/25/16 at 830

## 2016-10-29 NOTE — Telephone Encounter (Signed)
agree

## 2016-11-10 MED FILL — GAVILYTE-N SOLUTION: 420 | 1 days supply | Qty: 4000 | Fill #0

## 2016-11-16 MED FILL — IBUPROFEN 800 MG TAB: 800 | 30 days supply | Qty: 90 | Fill #3

## 2016-11-19 MED FILL — ATENOLOL 25 MG TABLET: 25 | 30 days supply | Qty: 60 | Fill #6

## 2016-11-25 ENCOUNTER — Encounter (HOSPITAL_COMMUNITY)
Admission: RE | Disposition: A | Discharge status: Home / Self Care | Payer: Self-pay | Source: Ambulatory Visit | Attending: Internal Medicine

## 2016-11-25 ENCOUNTER — Ambulatory Visit (HOSPITAL_COMMUNITY)
Admission: RE | Admit: 2016-11-25 | Discharge: 2016-11-25 | Disposition: A | Discharge status: Home / Self Care | Payer: 59 | Source: Ambulatory Visit | Attending: Internal Medicine | Admitting: Internal Medicine

## 2016-11-25 ENCOUNTER — Encounter (HOSPITAL_COMMUNITY): Payer: Self-pay | Admitting: *Deleted

## 2016-11-25 DIAGNOSIS — K5730 Diverticulosis of large intestine without perforation or abscess without bleeding: Secondary | ICD-10-CM | POA: Insufficient documentation

## 2016-11-25 DIAGNOSIS — Z1211 Encounter for screening for malignant neoplasm of colon: Secondary | ICD-10-CM | POA: Insufficient documentation

## 2016-11-25 DIAGNOSIS — I10 Essential (primary) hypertension: Secondary | ICD-10-CM | POA: Insufficient documentation

## 2016-11-25 DIAGNOSIS — M1990 Unspecified osteoarthritis, unspecified site: Secondary | ICD-10-CM | POA: Diagnosis not present

## 2016-11-25 DIAGNOSIS — Z96651 Presence of right artificial knee joint: Secondary | ICD-10-CM | POA: Insufficient documentation

## 2016-11-25 DIAGNOSIS — E785 Hyperlipidemia, unspecified: Secondary | ICD-10-CM | POA: Insufficient documentation

## 2016-11-25 DIAGNOSIS — F17210 Nicotine dependence, cigarettes, uncomplicated: Secondary | ICD-10-CM | POA: Insufficient documentation

## 2016-11-25 DIAGNOSIS — Z86010 Personal history of colonic polyps: Secondary | ICD-10-CM | POA: Insufficient documentation

## 2016-11-25 DIAGNOSIS — Z79899 Other long term (current) drug therapy: Secondary | ICD-10-CM | POA: Insufficient documentation

## 2016-11-25 DIAGNOSIS — Q438 Other specified congenital malformations of intestine: Secondary | ICD-10-CM | POA: Insufficient documentation

## 2016-11-25 DIAGNOSIS — Z09 Encounter for follow-up examination after completed treatment for conditions other than malignant neoplasm: Secondary | ICD-10-CM | POA: Diagnosis not present

## 2016-11-25 HISTORY — DX: Dyspnea, unspecified: R06.00

## 2016-11-25 HISTORY — PX: COLONOSCOPY: SHX5424

## 2016-11-25 SURGERY — COLONOSCOPY
Anesthesia: Moderate Sedation

## 2016-11-25 MED ORDER — MIDAZOLAM HCL 5 MG/5ML IJ SOLN
INTRAMUSCULAR | Status: DC | PRN
  Administered 2016-11-25 (×2): 2 mg via INTRAVENOUS
  Administered 2016-11-25: 1 mg via INTRAVENOUS
  Administered 2016-11-25: 2 mg via INTRAVENOUS

## 2016-11-25 MED ORDER — MIDAZOLAM HCL 5 MG/5ML IJ SOLN
INTRAMUSCULAR | Status: AC
  Filled 2016-11-25: qty 10

## 2016-11-25 MED ORDER — SODIUM CHLORIDE 0.9 % IV SOLN
INTRAVENOUS | Status: DC
  Administered 2016-11-25: 08:00:00 via INTRAVENOUS

## 2016-11-25 MED ORDER — MEPERIDINE HCL 50 MG/ML IJ SOLN
INTRAMUSCULAR | Status: AC
  Filled 2016-11-25: qty 1

## 2016-11-25 MED ORDER — STERILE WATER FOR IRRIGATION IR SOLN
Status: DC | PRN
  Administered 2016-11-25: 08:00:00

## 2016-11-25 MED ORDER — MEPERIDINE HCL 50 MG/ML IJ SOLN
INTRAMUSCULAR | Status: DC | PRN
  Administered 2016-11-25 (×2): 25 mg via INTRAVENOUS

## 2016-11-25 NOTE — H&P (Signed)
Jennifer RoachHazel W [name] is an 66 y.o. female.   Chief Complaint: Patient is here for colonoscopy. HPI: Patient is 66 year old African-American female was history of colonic adenomas and is here for surveillance colonoscopy. Last exam was in 2009. She was scheduled in 2014 postpone the procedure. She denies abdominal pain change in bowel habits or rectal bleeding. She takes ibuprofen 800 mg twice daily and does not experience any side effects. It is for arthritis. Family history is negative for CRC.  Past Medical History:  Diagnosis Date  . Dyspnea   . Hyperlipidemia   . Hypertension     Past Surgical History:  Procedure Laterality Date  . ABDOMINAL HYSTERECTOMY    . KNEE ARTHROSCOPY     BIL   2010   . LUNG BIOPSY     RIGHT 30 YRS AGO   . TOTAL KNEE ARTHROPLASTY Right 02/12/2014   Procedure: TOTAL KNEE ARTHROPLASTY;  Surgeon: Dannielle HuhSteve Lucey, MD;  Location: [facility] OR;  Service: Orthopedics;  Laterality: Right;    Family History  Problem Relation Age of Onset  . Colon cancer Neg Hx    Social History:  reports that she has been smoking Cigarettes.  She has a 10.00 pack-year smoking history. She has never used smokeless tobacco. She reports that she does not drink alcohol or use drugs.  Allergies: No Known Allergies  Medications Prior to Admission  Medication Sig Dispense Refill  . acetaminophen (TYLENOL) 500 MG tablet Take 1,000 mg by mouth 2 (two) times daily as needed for mild pain or moderate pain.     Marland Kitchen. atenolol (TENORMIN) 25 MG tablet Take 25 mg by mouth daily.    Marland Kitchen. atorvastatin (LIPITOR) 10 MG tablet Take 10 mg by mouth at bedtime.     Marland Kitchen. ibuprofen (ADVIL,MOTRIN) 800 MG tablet Take 800 mg by mouth every 8 (eight) hours as needed for moderate pain.    . magnesium hydroxide (MILK OF MAGNESIA) 400 MG/5ML suspension Take 15 mLs by mouth daily as needed for mild constipation.    . triamterene-hydrochlorothiazide (MAXZIDE-25) 37.5-25 MG per tablet Take 1 tablet by mouth daily.    . Naproxen Sodium  (ALEVE) 220 MG CAPS Take 440 mg by mouth 2 (two) times daily as needed (pain).       No results found for this or any previous visit (from the past 48 hour(s)). No results found.  ROS  Blood pressure 104/73, pulse 70, temperature 98.9 F (37.2 C), temperature source Oral, resp. rate 16, height 5\' 6"  (1.676 m), weight 230 lb (104.3 kg), SpO2 100 %. Physical Exam  Constitutional: She appears well-developed and well-nourished.  HENT:  Mouth/Throat: Oropharynx is clear and moist.  Eyes: Conjunctivae are normal. No scleral icterus.  Neck: No thyromegaly present.  Cardiovascular: Normal rate, regular rhythm and normal heart sounds.   No murmur heard. Respiratory: Effort normal and breath sounds normal.  GI:  Abdomen is full with lower midline scar and small infraumbilical hernia which is completely reducible. Abdomen is soft and nontender without organomegaly or masses.  Musculoskeletal: She exhibits no edema.  Lymphadenopathy:    She has no cervical adenopathy.  Neurological: She is alert.  Skin: Skin is warm and dry.     Assessment/Plan History of colonic polyps. Surveillance colonoscopy  Lionel DecemberNajeeb Rehman, MD 11/25/2016, 7:53 AM

## 2016-11-25 NOTE — Discharge Instructions (Signed)
Resume usual medications and high fiber diet. Please do not take Aleve or Naprosyn while you're taking ibuprofen. No driving for 24 hours. Can wait 7 years before next colonoscopy.   Colonoscopy, Adult, Care After This sheet gives you information about how to care for yourself after your procedure. Your doctor may also give you more specific instructions. If you have problems or questions, call your doctor. Follow these instructions at home: General instructions   For the first 24 hours after the procedure: ? Do not drive or use machinery. ? Do not sign important documents. ? Do not drink alcohol. ? Do your daily activities more slowly than normal. ? Eat foods that are soft and easy to digest. ? Rest often.  Take over-the-counter or prescription medicines only as told by your doctor.  It is up to you to get the results of your procedure. Ask your doctor, or the department performing the procedure, when your results will be ready. To help cramping and bloating:  Try walking around.  Put heat on your belly (abdomen) as told by your doctor. Use a heat source that your doctor recommends, such as a moist heat pack or a heating pad. ? Put a towel between your skin and the heat source. ? Leave the heat on for 20-30 minutes. ? Remove the heat if your skin turns bright red. This is especially important if you cannot feel pain, heat, or cold. You can get burned. Eating and drinking  Drink enough fluid to keep your pee (urine) clear or pale yellow.  Return to your normal diet as told by your doctor. Avoid heavy or fried foods that are hard to digest.  Avoid drinking alcohol for as long as told by your doctor. Contact a doctor if:  You have blood in your poop (stool) 2-3 days after the procedure. Get help right away if:  You have more than a small amount of blood in your poop.  You see large clumps of tissue (blood clots) in your poop.  Your belly is swollen.  You feel sick to  your stomach (nauseous).  You throw up (vomit).  You have a fever.  You have belly pain that gets worse, and medicine does not help your pain. This information is not intended to replace advice given to you by your health care provider. Make sure you discuss any questions you have with your health care provider. Document Released: 05/09/2010 Document Revised: 12/30/2015 Document Reviewed: 12/30/2015 Elsevier Interactive Patient Education  2017 [employer]

## 2016-11-25 NOTE — Op Note (Signed)
[facility] Patient Name: Jennifer CabalHazel [name] Procedure Date: 11/25/2016 8:29 AM MRN: [id number] Date of Birth: Aug 25, 1950 Attending MD: Lionel DecemberNajeeb Joylene Wescott , MD CSN: [id number] Age: 66 Admit Type: Outpatient Procedure:                Colonoscopy Indications:              High risk colon cancer surveillance: Personal                            history of colonic polyps Providers:                Lionel DecemberNajeeb Osinachi Navarrette, MD, Jannett CelestineAnitra Bell, RN, Edrick Kinsammy Vaught, RN Referring MD:             Oley Balmavid B. Margo Commonapper, MD Medicines:                Meperidine 50 mg IV, Midazolam 7 mg IV Complications:            No immediate complications. Estimated Blood Loss:     Estimated blood loss: none. Procedure:                Pre-Anesthesia Assessment:                           - Prior to the procedure, a History and Physical                            was performed, and patient medications and                            allergies were reviewed. The patient's tolerance of                            previous anesthesia was also reviewed. The risks                            and benefits of the procedure and the sedation                            options and risks were discussed with the patient.                            All questions were answered, and informed consent                            was obtained. Prior Anticoagulants: The patient                            last took ibuprofen 1 day prior to the procedure.                            ASA Grade Assessment: II - A patient with mild                            systemic disease. After reviewing the risks and  benefits, the patient was deemed in satisfactory                            condition to undergo the procedure.                           After obtaining informed consent, the colonoscope                            was passed under direct vision. Throughout the                            procedure, the patient's blood pressure, pulse, and                           oxygen saturations were monitored continuously. The                            EC-[id number] ([id number]) scope was introduced through                            the anus and advanced to the the cecum, identified                            by appendiceal orifice and ileocecal valve. The                            colonoscopy was somewhat difficult due to                            significant looping. Successful completion of the                            procedure was aided by changing the patient's                            position, using manual pressure and withdrawing and                            reinserting the scope. The patient tolerated the                            procedure well. The quality of the bowel                            preparation was adequate. The ileocecal valve,                            appendiceal orifice, and rectum were photographed. Scope In: Scope Out: Findings:      The perianal and digital rectal examinations were normal.      Multiple small and large-mouthed diverticula were found in the entire       colon.      The exam was otherwise without abnormality.      The retroflexed  view of the distal rectum and anal verge was normal and       showed no anal or rectal abnormalities. Impression:               - Diverticulosis in the entire examined colon.                           - The examination was otherwise normal.                           - No specimens collected. Moderate Sedation:      Moderate (conscious) sedation was administered by the endoscopy nurse       and supervised by the endoscopist. The following parameters were       monitored: oxygen saturation, heart rate, blood pressure, CO2       capnography and response to care. Total physician intraservice time was       28 minutes. Recommendation:           - Patient has a contact number available for                            emergencies. The signs and symptoms of  potential                            delayed complications were discussed with the                            patient. Return to normal activities tomorrow.                            Written discharge instructions were provided to the                            patient.                           - High fiber diet today.                           - Continue present medications.                           - Repeat colonoscopy in 7 years for surveillance. Procedure Code(s):        --- Professional ---                           [phone], Colonoscopy, flexible; diagnostic, including                            collection of specimen(s) by brushing or washing,                            when performed (separate procedure)                           99152, Moderate sedation services provided by the  same physician or other qualified health care                            professional performing the diagnostic or                            therapeutic service that the sedation supports,                            requiring the presence of an independent trained                            observer to assist in the monitoring of the                            patient's level of consciousness and physiological                            status; initial 15 minutes of intraservice time,                            patient age 64 years or older                           [phone], Moderate sedation services; each additional                            15 minutes intraservice time Diagnosis Code(s):        --- Professional ---                           Z86.010, Personal history of colonic polyps                           K57.30, Diverticulosis of large intestine without                            perforation or abscess without bleeding CPT copyright 2016 American Medical Association. All rights reserved. The codes documented in this report are preliminary and upon coder review may  be  revised to meet current compliance requirements. Lionel December, MD Lionel December, MD 11/25/2016 8:50:24 AM This report has been signed electronically. Number of Addenda: 0

## 2016-11-30 ENCOUNTER — Encounter (HOSPITAL_COMMUNITY): Payer: Self-pay | Admitting: Internal Medicine

## 2016-12-22 MED FILL — ATENOLOL 25 MG TABLET: 25 | 30 days supply | Qty: 60 | Fill #7

## 2016-12-22 MED FILL — TRIAMTERENE-HCTZ 37.5-25 MG: 37.5-25 | 90 days supply | Qty: 90 | Fill #2

## 2016-12-24 ENCOUNTER — Ambulatory Visit (INDEPENDENT_AMBULATORY_CARE_PROVIDER_SITE_OTHER): Payer: 59 | Admitting: Advanced Practice Midwife

## 2016-12-24 ENCOUNTER — Encounter (INDEPENDENT_AMBULATORY_CARE_PROVIDER_SITE_OTHER): Payer: Self-pay

## 2016-12-24 ENCOUNTER — Encounter: Payer: Self-pay | Admitting: Advanced Practice Midwife

## 2016-12-24 VITALS — BP 120/80 | HR 76 | Ht 66.0 in | Wt 235.0 lb

## 2016-12-24 DIAGNOSIS — N951 Menopausal and female climacteric states: Secondary | ICD-10-CM

## 2016-12-24 NOTE — Progress Notes (Signed)
[facility] Visit  Patient name: Jennifer [name] MRN [id number]  Date of birth: May 07, 1950  CC & HPI:  Jennifer [name] is a 66 y.o.  female presenting today for initially for a pap smear. She hasn't been to a GYN  "in years" and thought she needed to come.  However, she had a total hysterectomy for bleeding in her 78's, sees PCP regularly, gets yearly mammograms at [facility], and just had a colonoscopy last month. Therefore, no physical exam is needed  However, she did want to talk about hot flashes that she has had for 20 years.  Used estrogen for a while after surgery, says it didn't really work. Also, she is a little old to be (re)starting HRT, given possible risks.  Discussed pycnogenol. Will try that.   Pertinent History Reviewed:  Medical & Surgical Hx:   Past Medical History:  Diagnosis Date  . Dyspnea   . Hyperlipidemia   . Hypertension    Past Surgical History:  Procedure Laterality Date  . ABDOMINAL HYSTERECTOMY    . COLONOSCOPY N/A 11/25/2016   Procedure: COLONOSCOPY;  Surgeon: Malissa Hippo, MD;  Location: [facility];  Service: Endoscopy;  Laterality: N/A;  830  . KNEE ARTHROSCOPY     BIL   2010   . LUNG BIOPSY     RIGHT 30 YRS AGO   . TOTAL KNEE ARTHROPLASTY Right 02/12/2014   Procedure: TOTAL KNEE ARTHROPLASTY;  Surgeon: Dannielle Huh, MD;  Location: [facility] OR;  Service: Orthopedics;  Laterality: Right;   Family History  Problem Relation Age of Onset  . Colon cancer Neg Hx     Current Outpatient Prescriptions:  .  acetaminophen (TYLENOL) 500 MG tablet, Take 1,000 mg by mouth 2 (two) times daily as needed for mild pain or moderate pain. , Disp: , Rfl:  .  atenolol (TENORMIN) 25 MG tablet, Take 25 mg by mouth daily., Disp: , Rfl:  .  atorvastatin (LIPITOR) 10 MG tablet, Take 10 mg by mouth at bedtime. , Disp: , Rfl:  .  ibuprofen (ADVIL,MOTRIN) 800 MG tablet, Take 800 mg by mouth every 8 (eight) hours as needed for moderate pain., Disp: , Rfl:  .  magnesium  hydroxide (MILK OF MAGNESIA) 400 MG/5ML suspension, Take 15 mLs by mouth daily as needed for mild constipation., Disp: , Rfl:  .  triamterene-hydrochlorothiazide (MAXZIDE-25) 37.5-25 MG per tablet, Take 1 tablet by mouth daily., Disp: , Rfl:  Social History: Reviewed -  reports that she has been smoking Cigarettes.  She has a 10.00 pack-year smoking history. She has never used smokeless tobacco.  Review of Systems:   Constitutional: Negative for fever and chills Eyes: Negative for visual disturbances Respiratory: Negative for shortness of breath, dyspnea Cardiovascular: Negative for chest pain or palpitations  Gastrointestinal: Negative for vomiting, diarrhea and constipation; no abdominal pain Genitourinary: Negative for dysuria and urgency, vaginal irritation or itching Musculoskeletal: Negative for back pain, joint pain, myalgias  Neurological: Negative for dizziness and headaches    Objective Findings:    Physical Examination: General appearance - well appearing, and in no distress Mental status - alert, oriented to person, place, and time Chest:  Normal respiratory effort Heart - normal rate and regular rhythm Musculoskeletal:  Normal range of motion without pain Extremities:  No edema  50% or more of this visit was spent in counseling and coordination of care.  15 minutes of face to face time.   No results found for this or any  previous visit (from the past 24 hour(s)).    Assessment & Plan:  A:   Vasomotor sx P:  pycnogenol 100mg  /day. To call in 4-6 weeks and let me know how it's going.  Could increase to 200 if needed or try something else.    Return for If you have any problems.  CRESENZO-DISHMAN,Francis Doenges CNM 12/24/2016 3:41 PM

## 2016-12-24 NOTE — Patient Instructions (Signed)
PYCNOGENOL CAPSULES  100MG /DAY for hot flashes  Healthy Origins Pycnogenol (Nature's Super Antioxidant) 100 mg, (2 month supply $39.00)  [facility]

## 2016-12-28 ENCOUNTER — Telehealth: Payer: Self-pay | Admitting: *Deleted

## 2016-12-28 NOTE — Telephone Encounter (Signed)
LMOVM that per [name]'s note, patient to take  daily and to let her know in 4-6 weeks if not helping and dosage could be increased or medication changed. Advised to call back if further questions.

## 2017-01-14 MED FILL — ATORVASTATIN 10 MG TABLET: 10 | 90 days supply | Qty: 90 | Fill #0

## 2017-01-14 MED FILL — IBUPROFEN 800 MG TABS: 800 | 30 days supply | Qty: 90 | Fill #4

## 2017-01-25 MED FILL — ATENOLOL 25 MG TABLET: 25 | 30 days supply | Qty: 60 | Fill #8

## 2017-02-25 MED FILL — ATENOLOL 25 MG TABLET: 25 | 90 days supply | Qty: 180 | Fill #0

## 2017-03-17 MED FILL — IBUPROFEN 800 MG TABS: 800 | 30 days supply | Qty: 90 | Fill #5

## 2017-03-17 MED FILL — TRIAMTERENE/HCTZ 37.5/25 TB: 37.5-25 | 90 days supply | Qty: 90 | Fill #3

## 2017-04-28 MED FILL — OSELTAMIVIR PHOS 75 MG CAP: 75 | 10 days supply | Qty: 10 | Fill #0

## 2017-05-27 MED FILL — ATORVASTATIN 10 MG TABLET: 10 | 90 days supply | Qty: 90 | Fill #1

## 2017-05-27 MED FILL — ATENOLOL 25 MG TABLET: 25 | 90 days supply | Qty: 180 | Fill #1

## 2017-05-28 MED FILL — IBUPROFEN 800 MG TABS: 800 | 30 days supply | Qty: 90 | Fill #0

## 2017-07-06 MED FILL — TRIAMTERENE-HCTZ 37.5-25 MG: 37.5-25 | 90 days supply | Qty: 90 | Fill #0

## 2017-07-29 MED FILL — IBUPROFEN 800 MG TAB: 800 | 30 days supply | Qty: 90 | Fill #1

## 2017-08-10 NOTE — Progress Notes (Signed)
Subjective: DU:KGURKYHCW care, ankle pain, hand numbness HPI: Jennifer [name] is a 67 y.o. female presenting to clinic today for:  1.  Ankle pain Patient notes that she has had left foot/ankle pain that has been present for about 2 months.  She has that she is actually had similar pain in the past that she is placed in a walking boot for.  She is been utilizing the walking boot but feels that the symptoms are worsening.  She notes swelling along the medial ankle.  She denies preceding injury.  No numbness or tingling.  She has been using ibuprofen 800 mg with little improvement in symptoms.  2.  Hand numbness Patient notes at least a 2-year history of bilateral numbness and tingling in the fingers.  She was told that she had carpal tunnel many years ago.  She uses ibuprofen as above.  She does not use wrist braces or splints.  She is never had corticosteroid injections.  She is never seen an orthopedist for consideration of surgical release.  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  Past Medical History:  Diagnosis Date  . Arthritis   . Dyspnea   . Hyperlipidemia   . Hypertension    Past Surgical History:  Procedure Laterality Date  . ABDOMINAL HYSTERECTOMY    . COLONOSCOPY N/A 11/25/2016   Procedure: COLONOSCOPY;  Surgeon: Rogene Houston, MD;  [facility];  Service: Endoscopy;  Laterality: N/A;  830  . KNEE ARTHROSCOPY     BIL   2010   . LUNG BIOPSY     RIGHT 30 YRS AGO   . TOTAL KNEE ARTHROPLASTY Right 02/12/2014   Procedure: TOTAL KNEE ARTHROPLASTY;  Surgeon: Vickey Huger, MD;  Location: [facility];  Service: Orthopedics;  Laterality: Right;   Social History   Socioeconomic History  . Marital status: Married    Spouse name: Not on file  . Number of children: Not on file  . Years of education: Not on file  . Highest education level: Not on file  Occupational History  . Not on file  Social Needs  . Financial resource  strain: Not on file  . Food insecurity:    Worry: Not on file    Inability: Not on file  . Transportation needs:    Medical: Not on file    Non-medical: Not on file  Tobacco Use  . Smoking status: Current Every Day Smoker    Packs/day: 0.50    Years: 20.00    Pack years: 10.00    Types: Cigarettes  . Smokeless tobacco: Never Used  Substance and Sexual Activity  . Alcohol use: No  . Drug use: No  . Sexual activity: Yes  Lifestyle  . Physical activity:    Days per week: Not on file    Minutes per session: Not on file  . Stress: Not on file  Relationships  . Social connections:    Talks on phone: Not on file    Gets together: Not on file    Attends religious service: Not on file    Active member of club or organization: Not on file    Attends meetings of clubs or organizations: Not on file    Relationship status: Not on file  . Intimate partner violence:    Fear of current or ex partner: Not on file    Emotionally abused: Not on file    Physically abused: Not on  file    Forced sexual activity: Not on file  Other Topics Concern  . Not on file  Social History Narrative  . Not on file   Current Meds  Medication Sig  . acetaminophen (TYLENOL) 500 MG tablet Take 1,000 mg by mouth 2 (two) times daily as needed for mild pain or moderate pain.   Marland Kitchen atenolol (TENORMIN) 25 MG tablet Take 25 mg by mouth daily.  Marland Kitchen atorvastatin (LIPITOR) 10 MG tablet Take 10 mg by mouth at bedtime.   Marland Kitchen ibuprofen (ADVIL,MOTRIN) 800 MG tablet Take 800 mg by mouth every 8 (eight) hours as needed for moderate pain.  . magnesium hydroxide (MILK OF MAGNESIA) 400 MG/5ML suspension Take 15 mLs by mouth daily as needed for mild constipation.  . triamterene-hydrochlorothiazide (MAXZIDE-25) 37.5-25 MG per tablet Take 1 tablet by mouth daily.   Family History  Problem Relation Age of Onset  . Diabetes Mother   . Stroke Mother   . Alcohol abuse Father   . Stroke Father   . Colon cancer Neg Hx    No Known  Allergies   Health Maintenance: Hep C Screen, HIV screen. TDap.  Had mammo at [facility] Dx.   ROS: Per HPI  Objective: Office vital signs reviewed. BP 121/73   Pulse 68   Temp 98.2 F (36.8 C) (Oral)   Ht '5\' 6"'$  (1.676 m)   Wt 237 lb (107.5 kg)   BMI 38.25 kg/m   Physical Examination:  General: Awake, alert, obese, No acute distress HEENT: sclera white, MMM Cardio: regular rate; +2 DP Pulm:  normal work of breathing on room air Extremities: warm, well perfused, +1 pitting edema bilaterally to ankles. No cyanosis or clubbing; +2 pulses bilaterally MSK: antalgic gait and normal station  Wrist: Patient has full active range of motion.  No palpable bony abnormalities.  She has positive Tinel, positive Phalen and positive reverse Phalen.  Left ankle: Patient has notable swelling, particularly over the medial aspect of the ankle.  However, this is also present on the right.  She does have marked tenderness to palpation along the medial aspect of the plantar surface of the calcaneus.  She also has tenderness to palpation along the medial ligaments of the ankle. skin: dry; intact; no rashes or lesions Neuro: light touch sensation in tact.  Assessment/ Plan: 67 y.o. female   1. Chronic pain of left ankle We discussed obtaining imaging today here in office.  However, since orthopedics will likely obtain imaging during that visit patient wished to defer to that time.  I have discontinued her Motrin and replace this with meloxicam 7.5 to 15 mg daily with a meal and plenty of water.  Advised against use of other NSAIDs.  She may play ice, topical analgesic of choice.  Consider compression. - Ambulatory referral to Orthopedic Surgery  2. Carpal tunnel syndrome, bilateral Clinically consistent with carpal tunnel syndrome.  She is not pursued any conservative treatments at home.  I recommended bilateral wrist bracing at bedtime.  Oral NSAID as above.  May use ice.  Handout provided.  I have referred  her to orthopedic surgery for consideration of corticosteroid injection versus surgical release should she need this. - Ambulatory referral to Orthopedic Surgery  3. Establishing care with new doctor, encounter for Release of information form completed.  Will obtain records from previous PCP, Dr. Scotty Court.  4. Morbid obesity ([place]) We will obtain fasting labs in anticipation of her physical exam. - CMP14+EGFR; Future - CBC with Differential; Future -  Lipid Panel; Future  5. Screening for HIV without presence of risk factors - HIV antibody (with reflex)  6. Encounter for hepatitis C screening test for low risk patient - Hepatitis C antibody   Janora Norlander, DO [place] [phone]

## 2017-08-11 ENCOUNTER — Encounter: Payer: Self-pay | Admitting: Family Medicine

## 2017-08-11 ENCOUNTER — Ambulatory Visit: Payer: 59 | Admitting: Family Medicine

## 2017-08-11 VITALS — BP 121/73 | HR 68 | Temp 98.2°F | Ht 66.0 in | Wt 237.0 lb

## 2017-08-11 DIAGNOSIS — G5603 Carpal tunnel syndrome, bilateral upper limbs: Secondary | ICD-10-CM

## 2017-08-11 DIAGNOSIS — E1159 Type 2 diabetes mellitus with other circulatory complications: Secondary | ICD-10-CM | POA: Insufficient documentation

## 2017-08-11 DIAGNOSIS — M25572 Pain in left ankle and joints of left foot: Secondary | ICD-10-CM

## 2017-08-11 DIAGNOSIS — G8929 Other chronic pain: Secondary | ICD-10-CM | POA: Diagnosis not present

## 2017-08-11 DIAGNOSIS — Z1159 Encounter for screening for other viral diseases: Secondary | ICD-10-CM | POA: Diagnosis not present

## 2017-08-11 DIAGNOSIS — Z114 Encounter for screening for human immunodeficiency virus [HIV]: Secondary | ICD-10-CM | POA: Diagnosis not present

## 2017-08-11 DIAGNOSIS — E1169 Type 2 diabetes mellitus with other specified complication: Secondary | ICD-10-CM | POA: Insufficient documentation

## 2017-08-11 DIAGNOSIS — I152 Hypertension secondary to endocrine disorders: Secondary | ICD-10-CM | POA: Insufficient documentation

## 2017-08-11 DIAGNOSIS — Z7689 Persons encountering health services in other specified circumstances: Secondary | ICD-10-CM

## 2017-08-11 DIAGNOSIS — E785 Hyperlipidemia, unspecified: Secondary | ICD-10-CM

## 2017-08-11 DIAGNOSIS — F17200 Nicotine dependence, unspecified, uncomplicated: Secondary | ICD-10-CM | POA: Insufficient documentation

## 2017-08-11 DIAGNOSIS — M1712 Unilateral primary osteoarthritis, left knee: Secondary | ICD-10-CM | POA: Insufficient documentation

## 2017-08-11 DIAGNOSIS — I10 Essential (primary) hypertension: Secondary | ICD-10-CM

## 2017-08-11 MED ORDER — MELOXICAM 15 MG PO TABS: 7.5000 mg | ORAL_TABLET | Freq: Every day | ORAL | 0 refills | Status: DC

## 2017-08-11 MED FILL — MELOXICAM 15 MG TABLET: 15 | 30 days supply | Qty: 30 | Fill #0

## 2017-08-11 NOTE — Patient Instructions (Signed)
As we discussed, I would like you to brace both of your wrists at bedtime.  I have prescribed you meloxicam to replace her ibuprofen.  Remember to take this medication with food and plenty of water.  I have placed a referral to Dr. Rogue JuryLucy's office for your ankle and for your wrists.  He may consider giving you corticosteroid injections of the wrist to help relieve some of the pressure and pain.  You have prescribed a nonsteroidal anti-inflammatory drug (NSAID) today. This will help with ear pain and inflammation. Please do not take any other NSAIDs (ibuprofen/Motrin/Advil, naproxen/Aleve, meloxicam/Mobic, Voltaren/diclofenac). Please make sure to eat a meal when taking this medication.   Caution:  If you have a history of acid reflux/indigestion, I recommend that you take an antacid (such as Prilosec, Prevacid) daily while on the NSAID.  If you have a history of bleeding disorder, gastric ulcer, are on a blood thinner (like warfarin/Coumadin, Xarelto, Eliquis, etc) please do not take NSAID.  If you have ever had a heart attack, you should not take NSAIDs.   Carpal Tunnel Syndrome Carpal tunnel syndrome is a condition that causes pain in your hand and arm. The carpal tunnel is a narrow area that is on the palm side of your wrist. Repeated wrist motion or certain diseases may cause swelling in the tunnel. This swelling can pinch the main nerve in the wrist (median nerve). Follow these instructions at home: If you have a splint:  Wear it as told by your doctor. Remove it only as told by your doctor.  Loosen the splint if your fingers: ? Become numb and tingle. ? Turn blue and cold.  Keep the splint clean and dry. General instructions  Take over-the-counter and prescription medicines only as told by your doctor.  Rest your wrist from any activity that may be causing your pain. If needed, talk to your employer about changes that can be made in your work, such as getting a wrist pad to use  while typing.  If directed, apply ice to the painful area: ? Put ice in a plastic bag. ? Place a towel between your skin and the bag. ? Leave the ice on for 20 minutes, 2-3 times per day.  Keep all follow-up visits as told by your doctor. This is important.  Do any exercises as told by your doctor, physical therapist, or occupational therapist. Contact a doctor if:  You have new symptoms.  Medicine does not help your pain.  Your symptoms get worse. This information is not intended to replace advice given to you by your health care provider. Make sure you discuss any questions you have with your health care provider. Document Released: 03/26/2011 Document Revised: 09/12/2015 Document Reviewed: 08/22/2014 Elsevier Interactive Patient Education  [employer]2018 Elsevier Inc.

## 2017-08-11 NOTE — Addendum Note (Signed)
Addended by: Raliegh IpGOTTSCHALK, [name] on: 08/11/2017 02:15 PM   Modules accepted: Orders

## 2017-08-12 DIAGNOSIS — H524 Presbyopia: Secondary | ICD-10-CM | POA: Diagnosis not present

## 2017-08-12 DIAGNOSIS — H5203 Hypermetropia, bilateral: Secondary | ICD-10-CM | POA: Diagnosis not present

## 2017-08-12 DIAGNOSIS — H52223 Regular astigmatism, bilateral: Secondary | ICD-10-CM | POA: Diagnosis not present

## 2017-08-16 ENCOUNTER — Other Ambulatory Visit: Payer: 59

## 2017-08-16 ENCOUNTER — Other Ambulatory Visit: Payer: Self-pay | Admitting: Family Medicine

## 2017-08-16 DIAGNOSIS — E1151 Type 2 diabetes mellitus with diabetic peripheral angiopathy without gangrene: Secondary | ICD-10-CM | POA: Insufficient documentation

## 2017-08-16 DIAGNOSIS — Z1159 Encounter for screening for other viral diseases: Secondary | ICD-10-CM | POA: Diagnosis not present

## 2017-08-16 DIAGNOSIS — E119 Type 2 diabetes mellitus without complications: Secondary | ICD-10-CM

## 2017-08-16 DIAGNOSIS — Z114 Encounter for screening for human immunodeficiency virus [HIV]: Secondary | ICD-10-CM | POA: Diagnosis not present

## 2017-08-16 LAB — BAYER DCA HB A1C WAIVED: HB A1C: 6.9 % (ref <7.0)

## 2017-08-16 NOTE — Addendum Note (Signed)
Addended by: Raliegh Ip on: 08/16/2017 08:03 AM   Modules accepted: Orders

## 2017-08-17 ENCOUNTER — Other Ambulatory Visit: Payer: Self-pay | Admitting: Family Medicine

## 2017-08-17 LAB — HEPATITIS C ANTIBODY

## 2017-08-17 LAB — CBC WITH DIFFERENTIAL/PLATELET
BASOS: 1 %
Basophils Absolute: 0.1 10*3/uL (ref 0.0–0.2)
EOS (ABSOLUTE): 0.3 10*3/uL (ref 0.0–0.4)
Eos: 5 %
Hematocrit: 40.8 % (ref 34.0–46.6)
Hemoglobin: 13.8 g/dL (ref 11.1–15.9)
IMMATURE GRANS (ABS): 0 10*3/uL (ref 0.0–0.1)
Immature Granulocytes: 0 %
LYMPHS ABS: 1.8 10*3/uL (ref 0.7–3.1)
Lymphs: 26 %
MCH: 28.4 pg (ref 26.6–33.0)
MCHC: 33.8 g/dL (ref 31.5–35.7)
MCV: 84 fL (ref 79–97)
MONOCYTES: 8 %
MONOS ABS: 0.5 10*3/uL (ref 0.1–0.9)
NEUTROS PCT: 60 %
Neutrophils Absolute: 4.1 10*3/uL (ref 1.4–7.0)
PLATELETS: 262 10*3/uL (ref 150–379)
RBC: 4.86 x10E6/uL (ref 3.77–5.28)
RDW: 14.9 % (ref 12.3–15.4)
WBC: 6.7 10*3/uL (ref 3.4–10.8)

## 2017-08-17 LAB — CMP14+EGFR
ALK PHOS: 97 IU/L (ref 39–117)
ALT: 14 IU/L (ref 0–32)
AST: 16 IU/L (ref 0–40)
Albumin/Globulin Ratio: 1.4 (ref 1.2–2.2)
Albumin: 4.2 g/dL (ref 3.6–4.8)
BILIRUBIN TOTAL: 0.4 mg/dL (ref 0.0–1.2)
BUN/Creatinine Ratio: 24 (ref 12–28)
BUN: 18 mg/dL (ref 8–27)
CO2: 23 mmol/L (ref 20–29)
Calcium: 9.9 mg/dL (ref 8.7–10.3)
Chloride: 102 mmol/L (ref 96–106)
Creatinine, Ser: 0.75 mg/dL (ref 0.57–1.00)
GFR calc Af Amer: 97 mL/min/{1.73_m2} (ref >59)
GFR calc non Af Amer: 85 mL/min/{1.73_m2} (ref >59)
GLUCOSE: 114 mg/dL — AB (ref 65–99)
Globulin, Total: 3 g/dL (ref 1.5–4.5)
Potassium: 4.1 mmol/L (ref 3.5–5.2)
Sodium: 140 mmol/L (ref 134–144)
TOTAL PROTEIN: 7.2 g/dL (ref 6.0–8.5)

## 2017-08-17 LAB — LIPID PANEL
CHOLESTEROL TOTAL: 195 mg/dL (ref 100–199)
Chol/HDL Ratio: 4.2 ratio (ref 0.0–4.4)
HDL: 46 mg/dL (ref >39)
LDL Calculated: 115 mg/dL — ABNORMAL HIGH (ref 0–99)
Triglycerides: 171 mg/dL — ABNORMAL HIGH (ref 0–149)
VLDL CHOLESTEROL CAL: 34 mg/dL (ref 5–40)

## 2017-08-17 LAB — HIV ANTIBODY (ROUTINE TESTING W REFLEX): HIV Screen 4th Generation wRfx: NONREACTIVE

## 2017-08-19 ENCOUNTER — Telehealth: Payer: Self-pay | Admitting: Family Medicine

## 2017-08-19 ENCOUNTER — Other Ambulatory Visit: Payer: Self-pay | Admitting: Family Medicine

## 2017-08-19 DIAGNOSIS — M722 Plantar fascial fibromatosis: Secondary | ICD-10-CM | POA: Diagnosis not present

## 2017-08-19 DIAGNOSIS — M19072 Primary osteoarthritis, left ankle and foot: Secondary | ICD-10-CM | POA: Diagnosis not present

## 2017-08-19 DIAGNOSIS — M25572 Pain in left ankle and joints of left foot: Secondary | ICD-10-CM | POA: Diagnosis not present

## 2017-08-19 DIAGNOSIS — M7732 Calcaneal spur, left foot: Secondary | ICD-10-CM | POA: Diagnosis not present

## 2017-08-19 DIAGNOSIS — M064 Inflammatory polyarthropathy: Secondary | ICD-10-CM | POA: Diagnosis not present

## 2017-08-19 MED ORDER — ATENOLOL 25 MG PO TABS: 25.0000 mg | ORAL_TABLET | Freq: Every day | ORAL | 4 refills | Status: DC

## 2017-08-19 MED FILL — ATENOLOL 25 MG TABLET: 25 | 90 days supply | Qty: 90 | Fill #0

## 2017-08-19 MED FILL — predniSONE 20 MG TABS: 20 | 10 days supply | Qty: 20 | Fill #0

## 2017-08-19 NOTE — Telephone Encounter (Signed)
Please review and advise.

## 2017-08-24 ENCOUNTER — Other Ambulatory Visit: Payer: Self-pay | Admitting: Family Medicine

## 2017-08-24 DIAGNOSIS — E119 Type 2 diabetes mellitus without complications: Secondary | ICD-10-CM

## 2017-08-24 NOTE — Progress Notes (Signed)
I attempted to contact patient on 08/16/2017 with regards to her labs.  Her A1c at that time was 6.9%, giving her a diagnosis of new onset type 2 diabetes.  Unfortunately, she did not answer the phone on 08/16/2017.  I nurse tried to attempt to contact her again with lab results and left a voicemail to call back.  Patient never returned her call.  I have placed an order for diabetic nutritionist on 4/29, appears that she did receive the phone call for the diabetic nutritionist to schedule an appointment and information regarding the type 2 diabetes caught her off guard.  I explained the patient's results to her and informed her that we did indeed try to contact her earlier with this information.  She does wish to see the diabetic nutritionist, as she would like to not be on medications for diabetes if possible.  I have placed the referral again.  Patient is aware that she will be contacted for an appointment.  I encouraged her to follow-up with me in about 3 months for recheck of A1c.  [name]. Nadine Counts, DO [facility]

## 2017-08-28 ENCOUNTER — Encounter: Payer: Self-pay | Admitting: *Deleted

## 2017-08-30 ENCOUNTER — Telehealth: Payer: Self-pay | Admitting: Family Medicine

## 2017-08-30 NOTE — Telephone Encounter (Signed)
I spoke to patient on the phone.  She notes that her blood sugars have been running 99-130.  She notes some reluctance to start metformin at this time.  She is going to continue working on lifestyle modifications.  She is awaiting an appointment from nutrition for new diabetes management.  She will plan to see me in the next 3 to 6 months for repeat A1c.

## 2017-08-31 ENCOUNTER — Encounter: Payer: Self-pay | Admitting: Family Medicine

## 2017-09-02 ENCOUNTER — Ambulatory Visit (INDEPENDENT_AMBULATORY_CARE_PROVIDER_SITE_OTHER): Payer: 59 | Admitting: Podiatry

## 2017-09-02 ENCOUNTER — Ambulatory Visit (INDEPENDENT_AMBULATORY_CARE_PROVIDER_SITE_OTHER): Payer: 59

## 2017-09-02 ENCOUNTER — Encounter: Payer: Self-pay | Admitting: Podiatry

## 2017-09-02 ENCOUNTER — Other Ambulatory Visit: Payer: Self-pay | Admitting: Podiatry

## 2017-09-02 VITALS — BP 115/72 | HR 87 | Resp 16

## 2017-09-02 DIAGNOSIS — M7752 Other enthesopathy of left foot: Secondary | ICD-10-CM | POA: Diagnosis not present

## 2017-09-02 DIAGNOSIS — M79672 Pain in left foot: Secondary | ICD-10-CM | POA: Diagnosis not present

## 2017-09-02 DIAGNOSIS — M7750 Other enthesopathy of unspecified foot: Secondary | ICD-10-CM

## 2017-09-02 DIAGNOSIS — I739 Peripheral vascular disease, unspecified: Secondary | ICD-10-CM

## 2017-09-02 DIAGNOSIS — M779 Enthesopathy, unspecified: Secondary | ICD-10-CM | POA: Diagnosis not present

## 2017-09-02 MED ORDER — TRIAMCINOLONE ACETONIDE 10 MG/ML IJ SUSP
10.0000 mg | Freq: Once | INTRAMUSCULAR | Status: AC
  Administered 2017-09-02: 10 mg

## 2017-09-02 NOTE — Progress Notes (Signed)
Subjective:   Patient ID: Jennifer [name], female   DOB: 67 y.o.   MRN: [id number]   HPI Patient presents stating she is having a lot of pain on the inside of her left ankle and is also had a history of heel pain which is been ordered not significant.  Patient does have a lot of pain in her legs if she tries to walk and states that she has to stop walking after short period of time   ROS      Objective:  Physical Exam  Vascular status diminished with no palpable PT or DP pulses with neurological intact.  Patient does smoke half pack cigarettes per day and has exquisite discomfort in the medial side of the left ankle around the posterior tibial tendon and as it inserts close to the navicular.  Patient has warmth to her tissue but I do think has some kind of vascular compromise     Assessment:  Posterior tibial tendinitis left with inflammation and possibility for interstitial tear and possibility for vascular disease     Plan:  H&P x-ray reviewed and today I reviewed ABIs indicating PAD.  I went ahead and I am get a focus on the tendon I did careful sheath injection 3 mg Kenalog 5 mg Xylocaine and I then went ahead and applied a walking boot to completely immobilize the medial side of the foot to take pressure off of it.  Patient will be seen back to recheck again in 3 weeks and depending on symptoms may require MRI or possible orthotics  X-rays indicate compression of the arch left with bone spur formation and history of plantar fasciitis that I also discussed with her today and may be treated with orthotics

## 2017-09-02 NOTE — Patient Instructions (Signed)

## 2017-09-02 NOTE — Progress Notes (Signed)
   Subjective:    Patient ID: Jennifer [name], female    DOB: [birth year], 67 y.o.   MRN: [id number]  HPI    Review of Systems  All other systems reviewed and are negative.      Objective:   Physical Exam        Assessment & Plan:

## 2017-09-14 ENCOUNTER — Other Ambulatory Visit: Payer: Self-pay | Admitting: Podiatry

## 2017-09-14 ENCOUNTER — Ambulatory Visit (HOSPITAL_COMMUNITY)
Admission: RE | Admit: 2017-09-14 | Discharge: 2017-09-14 | Disposition: A | Discharge status: Home / Self Care | Payer: 59 | Source: Ambulatory Visit | Attending: Cardiovascular Disease | Admitting: Cardiovascular Disease

## 2017-09-14 DIAGNOSIS — M79672 Pain in left foot: Secondary | ICD-10-CM | POA: Insufficient documentation

## 2017-09-14 DIAGNOSIS — I739 Peripheral vascular disease, unspecified: Secondary | ICD-10-CM | POA: Insufficient documentation

## 2017-09-21 ENCOUNTER — Ambulatory Visit: Payer: 59 | Admitting: Cardiovascular Disease

## 2017-09-21 ENCOUNTER — Encounter: Payer: Self-pay | Admitting: Cardiovascular Disease

## 2017-09-21 VITALS — BP 113/80 | HR 77 | Ht 66.0 in | Wt 237.0 lb

## 2017-09-21 DIAGNOSIS — F17200 Nicotine dependence, unspecified, uncomplicated: Secondary | ICD-10-CM

## 2017-09-21 DIAGNOSIS — E782 Mixed hyperlipidemia: Secondary | ICD-10-CM | POA: Diagnosis not present

## 2017-09-21 DIAGNOSIS — I739 Peripheral vascular disease, unspecified: Secondary | ICD-10-CM | POA: Diagnosis not present

## 2017-09-21 MED ORDER — COENZYME Q-10 100 MG PO CAPS: 200.0000 mg | ORAL_CAPSULE | Freq: Every day | ORAL | 6 refills | Status: AC

## 2017-09-21 MED ORDER — ATORVASTATIN CALCIUM 40 MG PO TABS: 40.0000 mg | ORAL_TABLET | Freq: Every day | ORAL | 6 refills | Status: DC

## 2017-09-21 MED FILL — ATORVASTATIN 40 MG TABLET: 40 | 30 days supply | Qty: 30 | Fill #0

## 2017-09-21 NOTE — Progress Notes (Signed)
09/21/2017 Jennifer [name]   1950-04-28  [id number]  Primary Physician Raliegh Ip, DO Primary Cardiologist: Runell Gess MD Nicholes [place], [place]  HPI:  Jennifer [name] is a 67 y.o. severely overweight married African-American female mother of 3 children who works as a Psychologist, sport and exercise at [employer].  She was referred by Dr. Charlsie [name], her podiatrist for peripheral vascular evaluation because of foot pain.  Currently he could not feel a pulse on exam.  Her other medical problems include a long history of tobacco abuse, hypertension and hyperlipidemia.  She is never had a heart attack or stroke and denies chest pain or shortness of breath.  She really denies claudication as well.  Recent Dopplers performed 09/15/2017 revealed a right ABI of 0.93 and a left ABI 0.67 with an occluded left SFA.   Current Meds  Medication Sig  . acetaminophen (TYLENOL) 500 MG tablet Take 1,000 mg by mouth 2 (two) times daily as needed for mild pain or moderate pain.   Marland Kitchen atenolol (TENORMIN) 25 MG tablet Take 1 tablet (25 mg total) by mouth daily.  Marland Kitchen atorvastatin (LIPITOR) 40 MG tablet Take 1 tablet (40 mg total) by mouth at bedtime.  . magnesium hydroxide (MILK OF MAGNESIA) 400 MG/5ML suspension Take 15 mLs by mouth daily as needed for mild constipation.  . triamterene-hydrochlorothiazide (MAXZIDE-25) 37.5-25 MG per tablet Take 1 tablet by mouth daily.  . [DISCONTINUED] atorvastatin (LIPITOR) 10 MG tablet Take 10 mg by mouth at bedtime.      No Known Allergies  Social History   Socioeconomic History  . Marital status: Married    Spouse name: Not on file  . Number of children: 3  . Years of education: Not on file  . Highest education level: Not on file  Occupational History  . Not on file  Social Needs  . Financial resource strain: Not on file  . Food insecurity:    Worry: Not on file    Inability: Not on file  . Transportation needs:    Medical: Not on file    Non-medical: Not on  file  Tobacco Use  . Smoking status: Current Every Day Smoker    Packs/day: 0.50    Years: 20.00    Pack years: 10.00    Types: Cigarettes  . Smokeless tobacco: Never Used  Substance and Sexual Activity  . Alcohol use: No  . Drug use: No  . Sexual activity: Yes  Lifestyle  . Physical activity:    Days per week: Not on file    Minutes per session: Not on file  . Stress: Not on file  Relationships  . Social connections:    Talks on phone: Not on file    Gets together: Not on file    Attends religious service: Not on file    Active member of club or organization: Not on file    Attends meetings of clubs or organizations: Not on file    Relationship status: Not on file  . Intimate partner violence:    Fear of current or ex partner: Not on file    Emotionally abused: Not on file    Physically abused: Not on file    Forced sexual activity: Not on file  Other Topics Concern  . Not on file  Social History Narrative  . Not on file     Review of Systems: General: negative for chills, fever, night sweats or weight changes.  Cardiovascular: negative for  chest pain, dyspnea on exertion, edema, orthopnea, palpitations, paroxysmal nocturnal dyspnea or shortness of breath Dermatological: negative for rash Respiratory: negative for cough or wheezing Urologic: negative for hematuria Abdominal: negative for nausea, vomiting, diarrhea, bright red blood per rectum, melena, or hematemesis Neurologic: negative for visual changes, syncope, or dizziness All other systems reviewed and are otherwise negative except as noted above.    Blood pressure 113/80, pulse 77, height 5\' 6"  (1.676 m), weight 237 lb (107.5 kg).  General appearance: alert and no distress Neck: no adenopathy, no carotid bruit, no JVD, supple, symmetrical, trachea midline and thyroid not enlarged, symmetric, no tenderness/mass/nodules Lungs: clear to auscultation bilaterally Heart: regular rate and rhythm, S1, S2 normal, no  murmur, click, rub or gallop Extremities: extremities normal, atraumatic, no cyanosis or edema Pulses: 2+ and symmetric Skin: Skin color, texture, turgor normal. No rashes or lesions Neurologic: Alert and oriented X 3, normal strength and tone. Normal symmetric reflexes. Normal coordination and gait  EKG sinus rhythm at 77 with incomplete right bundle branch block.  I personally reviewed this EKG.  ASSESSMENT AND PLAN:   Benign essential hypertension History of essential hypertension her blood pressure measured today at 113/80.  She is on atenolol and Maxide.  Continue current meds at current dosing.  Mixed hyperlipidemia History of hyperlipidemia on atorvastatin 10 mg a day.  Her recent lipid profile performed 08/16/2017 revealed LDL 115.  It should be less than 70 for secondary prevention.  I am going to increase her atorvastatin to 40 mg a day and will recheck a lipid and liver profile in 3 months.  Tobacco use disorder History Long history of tobacco abuse having smoked 30 pack years currently smoking 1/2 pack/day recalcitrant to risk factor modification.  Peripheral arterial disease (HCC) [name] was referred by Dr. Charlsie Merlesegal for PAD.  She does complain of bilateral knee pain and has had a right total knee replacement.  She also complains of some foot pain as well but really denies claudication.  Dopplers performed 09/15/2017 revealed a right ABI 0.93 and a left ABI 0.67 with what appears to be an occluded left SFA.  There is no evidence of critical limb ischemia nor does she complain of claudication.  At this point we will continue to follow her conservatively.      Runell GessJonathan J. Rayneisha Bouza MD FACP,FACC,FAHA, [facility] 09/21/2017 2:28 PM

## 2017-09-21 NOTE — Patient Instructions (Signed)
Medication Instructions: Your physician recommends that you continue on your current medications as directed. Please refer to the Current Medication list given to you today.  Increase Atorvastatin to 40 mg daily  START Co Q 10--200 mg daily (2 caps)   Labwork: Your physician recommends that you return for a FASTING lipid profile and hepatic function panel in 3 months.   Follow-Up: Your physician wants you to follow-up in: 6 months with Dr. Allyson SabalBerry. You will receive a reminder letter in the mail two months in advance. If you don't receive a letter, please call our office to schedule the follow-up appointment.  If you need a refill on your cardiac medications before your next appointment, please call your pharmacy.

## 2017-09-21 NOTE — Assessment & Plan Note (Signed)
History of hyperlipidemia on atorvastatin 10 mg a day.  Her recent lipid profile performed 08/16/2017 revealed LDL 115.  It should be less than 70 for secondary prevention.  I am going to increase her atorvastatin to 40 mg a day and will recheck a lipid and liver profile in 3 months.

## 2017-09-21 NOTE — Assessment & Plan Note (Signed)
[name] was referred by Dr. Charlsie Merlesegal for PAD.  She does complain of bilateral knee pain and has had a right total knee replacement.  She also complains of some foot pain as well but really denies claudication.  Dopplers performed 09/15/2017 revealed a right ABI 0.93 and a left ABI 0.67 with what appears to be an occluded left SFA.  There is no evidence of critical limb ischemia nor does she complain of claudication.  At this point we will continue to follow her conservatively.

## 2017-09-21 NOTE — Assessment & Plan Note (Signed)
History Long history of tobacco abuse having smoked 30 pack years currently smoking 1/2 pack/day recalcitrant to risk factor modification.

## 2017-09-21 NOTE — Assessment & Plan Note (Signed)
History of essential hypertension her blood pressure measured today at 113/80.  She is on atenolol and Maxide.  Continue current meds at current dosing.

## 2017-09-22 MED FILL — IBUPROFEN 800 MG TAB: 800 | 30 days supply | Qty: 90 | Fill #0

## 2017-09-23 ENCOUNTER — Ambulatory Visit: Payer: 59 | Admitting: Podiatry

## 2017-09-23 ENCOUNTER — Encounter: Payer: Self-pay | Admitting: Podiatry

## 2017-09-23 DIAGNOSIS — M79672 Pain in left foot: Secondary | ICD-10-CM | POA: Diagnosis not present

## 2017-09-23 DIAGNOSIS — I739 Peripheral vascular disease, unspecified: Secondary | ICD-10-CM | POA: Diagnosis not present

## 2017-09-23 DIAGNOSIS — M7752 Other enthesopathy of left foot: Secondary | ICD-10-CM

## 2017-09-23 DIAGNOSIS — T148XXA Other injury of unspecified body region, initial encounter: Secondary | ICD-10-CM

## 2017-09-23 DIAGNOSIS — M7751 Other enthesopathy of right foot: Secondary | ICD-10-CM

## 2017-09-23 MED ORDER — HYDROCODONE-ACETAMINOPHEN 10-325 MG PO TABS: 1.0000 | ORAL_TABLET | Freq: Three times a day (TID) | ORAL | 0 refills | Status: DC | PRN

## 2017-09-23 NOTE — Progress Notes (Signed)
Subjective:   Patient ID: Jennifer RoachHazel W Reilly, female   DOB: 67 y.o.   MRN: [id number]   HPI Patient presents stating she is still having a lot of pain in her left ankle with swelling.  States that when she wears the boot it helps but she cannot wear it all the time and she has knee problems which are made worse by the boot.  I questioned her extensively today on any form of claudication symptoms and it does not appear that she does have claudication even though she continues to smoke half pack per day and I was concerned about her reduced pulses bilateral   ROS      Objective:  Physical Exam  Possibility for a tear of the posterior tibial tendon given the patient's lack of response so far to immobilization with also possibility that it is acute inflammation with flattening of the arch as part of the issue     Assessment:  Possibility for tear of the posterior tibial tendon with failure to respond so far to complete immobilization with significant flatfoot deformity bilateral     Plan:  At this time I did go ahead and I recommended MRI to rule out a interstitial tear of the posterior tibial tendon and I also casted for orthotics to try to lift up the arch which will be necessary.  Patient will be seen back for us to recheck again when we get the results of MRI and orthotic should be returned within 3 weeks and will continue immobilization until that time along with boot usage

## 2017-09-23 NOTE — Addendum Note (Signed)
Addended by: Alphia Kava'CONNELL, Mikale Silversmith D on: 09/23/2017 10:00 AM   Modules accepted: Orders

## 2017-10-06 MED FILL — TRIAMTERENE/HCTZ 37.5/25 TB: 37.5-25 | 90 days supply | Qty: 90 | Fill #0

## 2017-10-07 ENCOUNTER — Encounter (HOSPITAL_COMMUNITY): Payer: Self-pay

## 2017-10-07 ENCOUNTER — Other Ambulatory Visit: Payer: Self-pay | Admitting: Podiatry

## 2017-10-07 ENCOUNTER — Ambulatory Visit (HOSPITAL_COMMUNITY)
Admission: RE | Admit: 2017-10-07 | Discharge: 2017-10-07 | Disposition: A | Discharge status: Home / Self Care | Payer: 59 | Source: Ambulatory Visit | Attending: Podiatry | Admitting: Podiatry

## 2017-10-07 ENCOUNTER — Ambulatory Visit (HOSPITAL_COMMUNITY): Admission: RE | Admit: 2017-10-07 | Payer: 59 | Source: Ambulatory Visit

## 2017-10-07 DIAGNOSIS — X58XXXA Exposure to other specified factors, initial encounter: Secondary | ICD-10-CM | POA: Diagnosis not present

## 2017-10-07 DIAGNOSIS — S93402A Sprain of unspecified ligament of left ankle, initial encounter: Secondary | ICD-10-CM | POA: Diagnosis not present

## 2017-10-07 DIAGNOSIS — S96812A Strain of other specified muscles and tendons at ankle and foot level, left foot, initial encounter: Secondary | ICD-10-CM | POA: Diagnosis not present

## 2017-10-07 DIAGNOSIS — R52 Pain, unspecified: Secondary | ICD-10-CM

## 2017-10-07 NOTE — Progress Notes (Signed)
Val, would probably be a good patient for Dr. Ardelle AntonWagoner to see. I will see on the 1st and tee up for Dr. Ardelle AntonWagoner. Matt, let me know if you see any issues with seeing her

## 2017-10-08 ENCOUNTER — Ambulatory Visit (HOSPITAL_COMMUNITY): Payer: 59

## 2017-10-08 ENCOUNTER — Encounter (HOSPITAL_COMMUNITY): Payer: Self-pay

## 2017-10-18 ENCOUNTER — Ambulatory Visit: Payer: 59 | Admitting: Orthotics

## 2017-10-18 ENCOUNTER — Telehealth: Payer: Self-pay | Admitting: Podiatry

## 2017-10-18 ENCOUNTER — Encounter: Payer: Self-pay | Admitting: Podiatry

## 2017-10-18 ENCOUNTER — Ambulatory Visit (INDEPENDENT_AMBULATORY_CARE_PROVIDER_SITE_OTHER): Payer: 59 | Admitting: Podiatry

## 2017-10-18 DIAGNOSIS — M76822 Posterior tibial tendinitis, left leg: Secondary | ICD-10-CM | POA: Diagnosis not present

## 2017-10-18 DIAGNOSIS — I739 Peripheral vascular disease, unspecified: Secondary | ICD-10-CM

## 2017-10-18 DIAGNOSIS — T148XXA Other injury of unspecified body region, initial encounter: Secondary | ICD-10-CM

## 2017-10-18 DIAGNOSIS — M779 Enthesopathy, unspecified: Secondary | ICD-10-CM

## 2017-10-18 DIAGNOSIS — M79672 Pain in left foot: Secondary | ICD-10-CM

## 2017-10-18 NOTE — Progress Notes (Signed)
Patient came in today to pick up custom made foot orthotics.  The goals were accomplished and the patient reported no dissatisfaction with said orthotics.  Patient was advised of breakin period and how to report any issues.  Patient could not wear Left f/o due to being in a boot for torn tendon.

## 2017-10-18 NOTE — Patient Instructions (Signed)
Pre-Operative Instructions  Congratulations, you have decided to take an important step towards improving your quality of life.  You can be assured that the doctors and staff at [facility] will be with you every step of the way.  Here are some important things you should know:  1. Plan to be at the surgery center/hospital at least 1 (one) hour prior to your scheduled time, unless otherwise directed by the surgical center/hospital staff.  You must have a responsible adult accompany you, remain during the surgery and drive you home.  Make sure you have directions to the surgical center/hospital to ensure you arrive on time. 2. If you are having surgery at [facility] or  hospitals, you will need a copy of your medical history and physical form from your family physician within one month prior to the date of surgery. We will give you a form for your primary physician to complete.  3. We make every effort to accommodate the date you request for surgery.  However, there are times where surgery dates or times have to be moved.  We will contact you as soon as possible if a change in schedule is required.   4. No aspirin/ibuprofen for one week before surgery.  If you are on aspirin, any non-steroidal anti-inflammatory medications (Mobic, Aleve, Ibuprofen) should not be taken seven (7) days prior to your surgery.  You make take Tylenol for pain prior to surgery.  5. Medications - If you are taking daily heart and blood pressure medications, seizure, reflux, allergy, asthma, anxiety, pain or diabetes medications, make sure you notify the surgery center/hospital before the day of surgery so they can tell you which medications you should take or avoid the day of surgery. 6. No food or drink after midnight the night before surgery unless directed otherwise by surgical center/hospital staff. 7. No alcoholic beverages 24-hours prior to surgery.  No smoking 24-hours prior or 24-hours after  surgery. 8. Wear loose pants or shorts. They should be loose enough to fit over bandages, boots, and casts. 9. Don't wear slip-on shoes. Sneakers are preferred. 10. Bring your boot with you to the surgery center/hospital.  Also bring crutches or a walker if your physician has prescribed it for you.  If you do not have this equipment, it will be provided for you after surgery. 11. If you have not been contacted by the surgery center/hospital by the day before your surgery, call to confirm the date and time of your surgery. 12. Leave-time from work may vary depending on the type of surgery you have.  Appropriate arrangements should be made prior to surgery with your employer. 13. Prescriptions will be provided immediately following surgery by your doctor.  Fill these as soon as possible after surgery and take the medication as directed. Pain medications will not be refilled on weekends and must be approved by the doctor. 14. Remove nail polish on the operative foot and avoid getting pedicures prior to surgery. 15. Wash the night before surgery.  The night before surgery wash the foot and leg well with water and the antibacterial soap provided. Be sure to pay special attention to beneath the toenails and in between the toes.  Wash for at least three (3) minutes. Rinse thoroughly with water and dry well with a towel.  Perform this wash unless told not to do so by your physician.  Enclosed: 1 Ice pack (please put in freezer the night before surgery)   1 Hibiclens skin cleaner     Pre-op instructions  If you have any questions regarding the instructions, please do not hesitate to call our office.  [place]: [address], [place], [place] [address] -- [phone]  [place]: [address] [place], [place] [address] -- [phone]  [place]: [address]  [place], [place] [address] -- [phone]  [place]: [address], [address], [place], [place] [address] -- [phone]  Website:  [email or web address] 

## 2017-10-18 NOTE — Telephone Encounter (Signed)
Patient was told she was going to have medication sent to her today at the Outpatient pharmacy. She doesn't remember what it was suppose to be. If you can call the patient back at [phone]

## 2017-10-19 ENCOUNTER — Telehealth: Payer: Self-pay | Admitting: Podiatry

## 2017-10-19 NOTE — Telephone Encounter (Signed)
This is Lexmark InternationalHazel [name]. My birth date is 11-26-2050. I'm calling, Dr. Logan BoresEvans was supposed to call in some medicine at the [facility] for pain. Tell him I need the medicine, my foot killing me. He hasn't called it in yet. I forgot the name of the medicine it was that he was going to call. My number is [phone]. Thank you.

## 2017-10-20 MED ORDER — MELOXICAM 15 MG PO TABS: 15.0000 mg | ORAL_TABLET | Freq: Every day | ORAL | 1 refills | Status: DC

## 2017-10-20 MED FILL — ATORVASTATIN 40 MG TABLET: 40 | 30 days supply | Qty: 30 | Fill #1

## 2017-10-20 MED FILL — MELOXICAM 15 MG TABLET: 15 | 60 days supply | Qty: 60 | Fill #0

## 2017-10-20 NOTE — Telephone Encounter (Signed)
Dr. Logan BoresEvans ordered Meloxicam 15mg  #60 one tablet daily +1 refills.

## 2017-10-20 NOTE — Telephone Encounter (Addendum)
Unable to leave a message on the home phone, did not have the access code. I informed pt Dr. Evans had ordered a antiinfLogan Boreslammatory pain medication [facility].

## 2017-10-20 NOTE — Addendum Note (Signed)
Addended by: Alphia Kava'CONNELL, VALERY D on: 10/20/2017 10:02 AM   Modules accepted: Orders

## 2017-10-24 NOTE — Progress Notes (Signed)
    HPI: 67 year old female presenting today with a chief complaint of left ankle pain that began 3-4 months ago. She has been evaluated by Dr. Charlsie Merlesegal and states the pain has not improved. She has been wearing a CAM boot for immobilization. She denies injury. Patient is here for further evaluation and treatment.   Past Medical History:  Diagnosis Date  . Arthritis   . Dyspnea   . Hyperlipidemia   . Hypertension        Physical Exam: General: The patient is alert and oriented x3 in no acute distress.  Dermatology: Skin is warm, dry and supple bilateral lower extremities. Negative for open lesions or macerations.  Vascular: Palpable pedal pulses bilaterally. No edema or erythema noted. Capillary refill within normal limits.  Neurological: Epicritic and protective threshold grossly intact bilaterally.   Musculoskeletal Exam: Pain on palpation noted to the posterior tibial tendon of the left foot. Range of motion within normal limits. Muscle strength 5/5 in all muscle groups bilateral lower extremities.  MRI Impression:  1. Moderate tendinosis of the posterior tibial tendon with a longitudinal split tear and a high-grade partial-thickness tear just proximal to the navicular.  Assessment: 1. Posterior tibial tear/tendinitis left   Plan of Care:  1. Patient was evaluated. MRI was reviewed today. 2. Today we discussed the conservative versus surgical management of the presenting pathology. The patient opts for surgical management. All possible complications and details of the procedure were explained. All patient questions were answered. No guarantees were expressed or implied. 3. Authorization for surgery was initiated today. Surgery will consist of repair of the PT tendon of the left lower extremity.  4. Continue weightbearing in CAM boot.  5. Return to clinic one week post op.   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     Felecia ShellingBrent M. Evans, DPM [facility]  Dr. Felecia ShellingBrent M. Evans, DPM     [address]                                        [place], [place] [address]                Office [phone]  Fax [phone]

## 2017-11-11 ENCOUNTER — Encounter: Payer: Self-pay | Admitting: Podiatry

## 2017-11-11 ENCOUNTER — Other Ambulatory Visit: Payer: Self-pay | Admitting: *Deleted

## 2017-11-11 DIAGNOSIS — M76822 Posterior tibial tendinitis, left leg: Secondary | ICD-10-CM

## 2017-11-11 DIAGNOSIS — E7800 Pure hypercholesterolemia, unspecified: Secondary | ICD-10-CM | POA: Diagnosis not present

## 2017-11-11 DIAGNOSIS — M66372 Spontaneous rupture of flexor tendons, left ankle and foot: Secondary | ICD-10-CM | POA: Diagnosis not present

## 2017-11-11 MED FILL — OXYCODONE-ACETAMINOPHEN 5-3: 5-325 | 5 days supply | Qty: 30 | Fill #0

## 2017-11-12 ENCOUNTER — Telehealth: Payer: Self-pay | Admitting: Podiatry

## 2017-11-12 DIAGNOSIS — Z9889 Other specified postprocedural states: Secondary | ICD-10-CM

## 2017-11-12 DIAGNOSIS — M76822 Posterior tibial tendinitis, left leg: Secondary | ICD-10-CM | POA: Diagnosis not present

## 2017-11-12 NOTE — Telephone Encounter (Signed)
Answered in previous Telephone Call.

## 2017-11-12 NOTE — Addendum Note (Signed)
Addended by: Alphia Kava'CONNELL, Amran Malter D on: 11/12/2017 10:07 AM   Modules accepted: Orders

## 2017-11-12 NOTE — Telephone Encounter (Signed)
I had my surgery yesterday and I'm calling to see about my scooter so I can get around. The best number to reach me at is [phone] or you can call my cell [phone]. Please give me a call and let me know something. Thank you.

## 2017-11-12 NOTE — Telephone Encounter (Deleted)
Faxed orders for knee scooter to [facility] and emailed to A. Trotter. 

## 2017-11-12 NOTE — Telephone Encounter (Signed)
I spoke with pt and she states the [facility] had already called. I did not email or fax duplicate orders to [facility].

## 2017-11-12 NOTE — Telephone Encounter (Signed)
Patient called again about the knee scooter. Please call back at [phone]

## 2017-11-15 MED FILL — ATENOLOL 25 MG TABLET: 25 | 90 days supply | Qty: 90 | Fill #1

## 2017-11-17 ENCOUNTER — Encounter: Payer: Self-pay | Admitting: Podiatry

## 2017-11-17 ENCOUNTER — Ambulatory Visit (INDEPENDENT_AMBULATORY_CARE_PROVIDER_SITE_OTHER): Payer: 59 | Admitting: Podiatry

## 2017-11-17 ENCOUNTER — Ambulatory Visit (INDEPENDENT_AMBULATORY_CARE_PROVIDER_SITE_OTHER): Payer: 59

## 2017-11-17 VITALS — BP 108/70 | HR 71 | Temp 98.1°F | Resp 16

## 2017-11-17 DIAGNOSIS — Z9889 Other specified postprocedural states: Secondary | ICD-10-CM

## 2017-11-17 DIAGNOSIS — M76822 Posterior tibial tendinitis, left leg: Secondary | ICD-10-CM

## 2017-11-17 MED ORDER — ONDANSETRON HCL 4 MG PO TABS: 4.0000 mg | ORAL_TABLET | Freq: Three times a day (TID) | ORAL | 0 refills | Status: DC | PRN

## 2017-11-17 MED FILL — ONDANSETRON HCL 4 MG TABLET: 4 | 7 days supply | Qty: 20 | Fill #0

## 2017-11-23 MED FILL — ATORVASTATIN 40 MG TABLET: 40 | 90 days supply | Qty: 90 | Fill #2

## 2017-11-23 NOTE — Progress Notes (Signed)
   Subjective:  Patient presents today status post PT tendon repair of the left foot. DOS: 11/11/17. He reports some intermittent aching pain of the foot. He states the Percocet causes nausea and vomiting. He has been elevating the foot which helps with his pain. Patient is here for further evaluation and treatment.    Past Medical History:  Diagnosis Date  . Arthritis   . Dyspnea   . Hyperlipidemia   . Hypertension       Objective/Physical Exam Neurovascular status intact. Cast left intact. Capillary refill immediate to all digits.   Radiographic Exam:  Osteotomies sites appear to be stable with routine healing.  Assessment: 1. s/p PT tendon repair left. DOS: 11/11/17   Plan of Care:  1. Patient was evaluated. X-rays reviewed 2. Continue nonweightbearing using knee scooter.  3. Prescription for Zofran 4mg  as needed for nausea.  4. Return to clinic in one week.    Felecia ShellingBrent M. Aarib Pulido, DPM [facility]  Dr. Felecia ShellingBrent M. Heaven Wandell, DPM    [address]                                        [place], [place] [address]                Office [phone]  Fax [phone]

## 2017-11-24 ENCOUNTER — Encounter: Payer: 59 | Admitting: Podiatry

## 2017-11-24 ENCOUNTER — Ambulatory Visit (INDEPENDENT_AMBULATORY_CARE_PROVIDER_SITE_OTHER): Payer: 59

## 2017-11-24 VITALS — BP 125/81 | HR 78 | Temp 98.0°F

## 2017-11-24 DIAGNOSIS — M76822 Posterior tibial tendinitis, left leg: Secondary | ICD-10-CM

## 2017-11-24 DIAGNOSIS — Z9889 Other specified postprocedural states: Secondary | ICD-10-CM

## 2017-11-26 NOTE — Progress Notes (Signed)
Patient presents today for postoperative follow-up appointment.  Procedure performed: PT tendon repair left foot, date of surgery 11/11/2017.  States that she was having some pain for a few days postoperatively, but her pain has since improved.  She does complain of nausea when taking pain medication but uses Zofran for nausea.  Noted well-healing surgical site, staples remain in place.  No gapping.  No erythema, no redness, some mild swelling within normal limits for postoperative status.  Patient's vital signs are stable blood pressure 125/70 pulse 87, temperature 98.0.  At this time she denies fever chills nausea with the exception of nausea when taking pain medication..  Patient was evaluated by Dr.Evans as well.  Dry sterile dressing was applied along with compression.  Patient was placed in a cam boot.  She was advised to remain nonweightbearing and utilize her knee scooter.  She is to follow-up next week for further evaluation or sooner if any problems arise.

## 2017-12-01 ENCOUNTER — Ambulatory Visit (INDEPENDENT_AMBULATORY_CARE_PROVIDER_SITE_OTHER): Payer: 59 | Admitting: Podiatry

## 2017-12-01 DIAGNOSIS — Z9889 Other specified postprocedural states: Secondary | ICD-10-CM

## 2017-12-01 DIAGNOSIS — M76822 Posterior tibial tendinitis, left leg: Secondary | ICD-10-CM

## 2017-12-01 MED ORDER — OXYCODONE-ACETAMINOPHEN 5-325 MG PO TABS: 1.0000 | ORAL_TABLET | Freq: Three times a day (TID) | ORAL | 0 refills | Status: DC | PRN

## 2017-12-01 MED ORDER — HYDROCODONE-ACETAMINOPHEN 10-325 MG PO TABS: 1.0000 | ORAL_TABLET | Freq: Three times a day (TID) | ORAL | 0 refills | Status: DC | PRN

## 2017-12-01 MED FILL — HYDROCODON-APAP 10-325: 10-325 | 10 days supply | Qty: 30 | Fill #0

## 2017-12-03 NOTE — Progress Notes (Signed)
   Subjective:  Patient presents today status post PT tendon repair of the left foot. DOS: 11/11/17. She reports intermittent throbbing pain of the left foot. She has been taking the pain medication and elevating the foot for treatment. Standing, even though she has been nonweightbearing, increases the pain. Patient is here for further evaluation and treatment.    Past Medical History:  Diagnosis Date  . Arthritis   . Dyspnea   . Hyperlipidemia   . Hypertension       Objective/Physical Exam Neurovascular status intact.  Skin incisions appear to be well coapted with sutures and staples intact. No sign of infectious process noted. No dehiscence. No active bleeding noted. Moderate edema noted to the surgical extremity.   Assessment: 1. s/p PT tendon repair left. DOS: 11/11/17   Plan of Care:  1. Patient was evaluated.  2. Staples removed. Dry sterile dressing applied.  3. Continue nonweightbearing in CAM boot. Patient can begin partial weightbearing in one week with CAM boot.  4. Refill prescription for Percocet 5/325 mg provided to patient.  5. Return to clinic in 2 weeks.     Felecia ShellingBrent M. Evans, DPM [facility]  Dr. Felecia ShellingBrent M. Evans, DPM    [address]                                        [place], [place] [address]                Office [phone]) [phone]  Fax [phone]) [phone]

## 2017-12-08 ENCOUNTER — Ambulatory Visit (INDEPENDENT_AMBULATORY_CARE_PROVIDER_SITE_OTHER): Payer: 59

## 2017-12-08 ENCOUNTER — Encounter: Payer: 59 | Admitting: Podiatry

## 2017-12-08 DIAGNOSIS — M76822 Posterior tibial tendinitis, left leg: Secondary | ICD-10-CM

## 2017-12-15 ENCOUNTER — Ambulatory Visit (INDEPENDENT_AMBULATORY_CARE_PROVIDER_SITE_OTHER): Payer: 59 | Admitting: Podiatry

## 2017-12-15 ENCOUNTER — Encounter: Payer: Self-pay | Admitting: Podiatry

## 2017-12-15 DIAGNOSIS — M76822 Posterior tibial tendinitis, left leg: Secondary | ICD-10-CM

## 2017-12-15 DIAGNOSIS — Z9889 Other specified postprocedural states: Secondary | ICD-10-CM

## 2017-12-18 NOTE — Progress Notes (Signed)
   Subjective:  Patient presents today status post PT tendon repair of the left foot. DOS: 11/11/17. She reports some mild pain along the incision scar but states she believes she may have irritated it last night while cleaning it. She denies modifying factors. She has been using the compression anklet and CAM boot as directed. Patient is here for further evaluation and treatment.   Past Medical History:  Diagnosis Date  . Arthritis   . Dyspnea   . Hyperlipidemia   . Hypertension       Objective/Physical Exam Neurovascular status intact.  Skin incisions appear to be healed. No sign of infectious process noted. Moderate edema noted to the surgical extremity.   Assessment: 1. s/p PT tendon repair left. DOS: 11/11/17   Plan of Care:  1. Patient was evaluated.  2. Continue using compression anklet and CAM boot. Partial weightbearing.  3. Extend FMLA for one month. Planned return to work on October 9th.  4. Return to clinic in 2 weeks to initiate physical therapy.     Felecia ShellingBrent M. Lucelia Lacey, DPM [facility]  Dr. Felecia ShellingBrent M. Kruz Chiu, DPM    [address]                                        [place], [place] [address]                Office [phone]  Fax [phone]

## 2017-12-21 NOTE — Progress Notes (Signed)
Patient is here today for follow-up appointment, procedure performed PT tendon repair left, date of surgery 11/11/2017.  She states that overall she is managing her pain very well, she is not walking on her foot, and she is elevating when needed.  Noted well healing surgical incision all staples remain intact.  No redness, no erythema, no drainage, wound edges coapted.  Mild swelling noted within normal limits for postoperative state.  All staples were removed, and wound edges remained aligned and approximated.  Applied Steri-Strips and compressive bandage.  Advised her continue nonweightbearing with the use of scooter, she is to remain in her cam boot at all times.  She is to follow-up in 1 week or sooner if acute symptoms arise.

## 2017-12-29 ENCOUNTER — Ambulatory Visit (INDEPENDENT_AMBULATORY_CARE_PROVIDER_SITE_OTHER): Payer: 59 | Admitting: Podiatry

## 2017-12-29 DIAGNOSIS — M76822 Posterior tibial tendinitis, left leg: Secondary | ICD-10-CM

## 2017-12-29 DIAGNOSIS — Z9889 Other specified postprocedural states: Secondary | ICD-10-CM

## 2017-12-31 ENCOUNTER — Ambulatory Visit: Payer: 59 | Admitting: Family Medicine

## 2017-12-31 ENCOUNTER — Encounter: Payer: Self-pay | Admitting: Family Medicine

## 2017-12-31 VITALS — BP 119/76 | HR 79 | Temp 98.6°F | Ht 66.0 in

## 2017-12-31 DIAGNOSIS — E119 Type 2 diabetes mellitus without complications: Secondary | ICD-10-CM | POA: Diagnosis not present

## 2017-12-31 DIAGNOSIS — E785 Hyperlipidemia, unspecified: Secondary | ICD-10-CM

## 2017-12-31 DIAGNOSIS — I10 Essential (primary) hypertension: Secondary | ICD-10-CM | POA: Diagnosis not present

## 2017-12-31 DIAGNOSIS — E1169 Type 2 diabetes mellitus with other specified complication: Secondary | ICD-10-CM | POA: Diagnosis not present

## 2017-12-31 LAB — BAYER DCA HB A1C WAIVED: HB A1C (BAYER DCA - WAIVED): 7.3 % — ABNORMAL HIGH (ref <7.0)

## 2017-12-31 MED ORDER — TRIAMTERENE-HCTZ 37.5-25 MG PO TABS: 1.0000 | ORAL_TABLET | Freq: Every day | ORAL | 4 refills | Status: DC

## 2017-12-31 MED ORDER — METFORMIN HCL 500 MG PO TABS: 500.0000 mg | ORAL_TABLET | Freq: Two times a day (BID) | ORAL | 0 refills | Status: DC

## 2017-12-31 MED FILL — TRIAMTERENE/HCTZ 37.5/25 TB: 37.5-25 | 90 days supply | Qty: 90 | Fill #0

## 2017-12-31 MED FILL — metFORMIN HCL 500 MG TABS: 500 | 90 days supply | Qty: 180 | Fill #0

## 2017-12-31 NOTE — Patient Instructions (Signed)
Your hemoglobin A1c has increased from last time.  It is up to 7.3 today.  We discussed starting metformin.  I have prescribed this as a 500 mg tablet twice a day.  For the next 2 weeks I would like you only to do 500 mg once a day.  After 2 weeks, you may increase to twice a day.  We discussed that when the very common side effects of this medicine is stomach upset, including diarrhea.  These side effects do improve after time.  Please try and stick with the medication.   Diabetes Mellitus and Nutrition When you have diabetes (diabetes mellitus), it is very important to have healthy eating habits because your blood sugar (glucose) levels are greatly affected by what you eat and drink. Eating healthy foods in the appropriate amounts, at about the same times every day, can help you:  Control your blood glucose.  Lower your risk of heart disease.  Improve your blood pressure.  Reach or maintain a healthy weight.  Every person with diabetes is different, and each person has different needs for a meal plan. Your health care provider may recommend that you work with a diet and nutrition specialist (dietitian) to make a meal plan that is best for you. Your meal plan may vary depending on factors such as:  The calories you need.  The medicines you take.  Your weight.  Your blood glucose, blood pressure, and cholesterol levels.  Your activity level.  Other health conditions you have, such as heart or kidney disease.  How do carbohydrates affect me? Carbohydrates affect your blood glucose level more than any other type of food. Eating carbohydrates naturally increases the amount of glucose in your blood. Carbohydrate counting is a method for keeping track of how many carbohydrates you eat. Counting carbohydrates is important to keep your blood glucose at a healthy level, especially if you use insulin or take certain oral diabetes medicines. It is important to know how many carbohydrates you can  safely have in each meal. This is different for every person. Your dietitian can help you calculate how many carbohydrates you should have at each meal and for snack. Foods that contain carbohydrates include:  Bread, cereal, rice, pasta, and crackers.  Potatoes and corn.  Peas, beans, and lentils.  Milk and yogurt.  Fruit and juice.  Desserts, such as cakes, cookies, ice cream, and candy.  How does alcohol affect me? Alcohol can cause a sudden decrease in blood glucose (hypoglycemia), especially if you use insulin or take certain oral diabetes medicines. Hypoglycemia can be a life-threatening condition. Symptoms of hypoglycemia (sleepiness, dizziness, and confusion) are similar to symptoms of having too much alcohol. If your health care provider says that alcohol is safe for you, follow these guidelines:  Limit alcohol intake to no more than 1 drink per day for nonpregnant women and 2 drinks per day for men. One drink equals 12 oz of beer, 5 oz of wine, or 1 oz of hard liquor.  Do not drink on an empty stomach.  Keep yourself hydrated with water, diet soda, or unsweetened iced tea.  Keep in mind that regular soda, juice, and other mixers may contain a lot of sugar and must be counted as carbohydrates.  What are tips for following this plan? Reading food labels  Start by checking the serving size on the label. The amount of calories, carbohydrates, fats, and other nutrients listed on the label are based on one serving of the food. Many  foods contain more than one serving per package.  Check the total grams (g) of carbohydrates in one serving. You can calculate the number of servings of carbohydrates in one serving by dividing the total carbohydrates by 15. For example, if a food has 30 g of total carbohydrates, it would be equal to 2 servings of carbohydrates.  Check the number of grams (g) of saturated and trans fats in one serving. Choose foods that have low or no amount of these  fats.  Check the number of milligrams (mg) of sodium in one serving. Most people should limit total sodium intake to less than 2,300 mg per day.  Always check the nutrition information of foods labeled as "low-fat" or "nonfat". These foods may be higher in added sugar or refined carbohydrates and should be avoided.  Talk to your dietitian to identify your daily goals for nutrients listed on the label. Shopping  Avoid buying canned, premade, or processed foods. These foods tend to be high in fat, sodium, and added sugar.  Shop around the outside edge of the grocery store. This includes fresh fruits and vegetables, bulk grains, fresh meats, and fresh dairy. Cooking  Use low-heat cooking methods, such as baking, instead of high-heat cooking methods like deep frying.  Cook using healthy oils, such as olive, canola, or sunflower oil.  Avoid cooking with butter, cream, or high-fat meats. Meal planning  Eat meals and snacks regularly, preferably at the same times every day. Avoid going long periods of time without eating.  Eat foods high in fiber, such as fresh fruits, vegetables, beans, and whole grains. Talk to your dietitian about how many servings of carbohydrates you can eat at each meal.  Eat 4-6 ounces of lean protein each day, such as lean meat, chicken, fish, eggs, or tofu. 1 ounce is equal to 1 ounce of meat, chicken, or fish, 1 egg, or 1/4 cup of tofu.  Eat some foods each day that contain healthy fats, such as avocado, nuts, seeds, and fish. Lifestyle   Check your blood glucose regularly.  Exercise at least 30 minutes 5 or more days each week, or as told by your health care provider.  Take medicines as told by your health care provider.  Do not use any products that contain nicotine or tobacco, such as cigarettes and e-cigarettes. If you need help quitting, ask your health care provider.  Work with a Veterinary surgeon or diabetes educator to identify strategies to manage stress  and any emotional and social challenges. What are some questions to ask my health care provider?  Do I need to meet with a diabetes educator?  Do I need to meet with a dietitian?  What number can I call if I have questions?  When are the best times to check my blood glucose? Where to find more information:  American Diabetes Association: diabetes.org/food-and-fitness/food  Academy of Nutrition and Dietetics: https://www.vargas.com/  [employer] of Diabetes and Digestive and Kidney Diseases (NIH): FindJewelers.cz Summary  A healthy meal plan will help you control your blood glucose and maintain a healthy lifestyle.  Working with a diet and nutrition specialist (dietitian) can help you make a meal plan that is best for you.  Keep in mind that carbohydrates and alcohol have immediate effects on your blood glucose levels. It is important to count carbohydrates and to use alcohol carefully. This information is not intended to replace advice given to you by your health care provider. Make sure you discuss any questions you have with  your health care provider. Document Released: 01/01/2005 Document Revised: 05/11/2016 Document Reviewed: 05/11/2016 Elsevier Interactive Patient Education  [employer].

## 2017-12-31 NOTE — Progress Notes (Signed)
Subjective: CC: New onset DM2 PCP: Raliegh Ip, DO Jennifer [name] is a 67 y.o. female presenting to clinic today for:  1. Type 2 Diabetes w/ HTN and HLD Patient reports she was never contacted again by nutrition to set up an appointment.  At this point, she does not care to see a nutritionist any longer.  She has try to cut back on sweets.  She does report increased stress related to her left foot, which she had to have surgery on this past summer.  She is currently at home/out of work while she recovers.  She thinks that she will be starting physical therapy sometime next week.  For now, she is using a rolling walker and a walking boot.  She reports compliance with Lipitor, which was recently increased to 40 mg daily by her cardiologist.  She was also started on co-Q10.  Last eye exam: Had her eye exam in March of this year.  She notes this was a normal eye exam and denies any retinopathy.  She has to have cataracts addressed soon.  She is seen in [place]. Last foot exam: Needs Last A1c: 6.9 in April 2019 Nephropathy screen indicated?:  Needs Last flu, zoster and/or pneumovax: Needs Flu/ TDap  ROS: No unintended weight loss/gain, foot ulcerations, numbness or tingling in extremities or chest pain.    ROS: Per HPI  Allergies  Allergen Reactions  . Hydrocodone Itching  . Oxycodone Nausea And Vomiting    Pt stated, "It makes me not want to eat"   Past Medical History:  Diagnosis Date  . Arthritis   . Dyspnea   . Hyperlipidemia   . Hypertension     Current Outpatient Medications:  .  acetaminophen (TYLENOL) 500 MG tablet, Take 1,000 mg by mouth 2 (two) times daily as needed for mild pain or moderate pain. , Disp: , Rfl:  .  atenolol (TENORMIN) 25 MG tablet, Take 1 tablet (25 mg total) by mouth daily., Disp: 90 tablet, Rfl: 4 .  atorvastatin (LIPITOR) 40 MG tablet, Take 1 tablet (40 mg total) by mouth at bedtime., Disp: 30 tablet, Rfl: 6 .  Coenzyme Q-10 100 MG  capsule, Take 2 capsules (200 mg total) by mouth daily., Disp: 60 capsule, Rfl: 6 .  HYDROcodone-acetaminophen (NORCO) 10-325 MG tablet, Take 1 tablet by mouth every 8 (eight) hours as needed., Disp: 30 tablet, Rfl: 0 .  ibuprofen (ADVIL,MOTRIN) 800 MG tablet, TAKE 1 TABLET BY MOUTH 3 TIMES DAILY WITH FOOD AS NEEDED ARTHRITIS PAIN, Disp: , Rfl:  .  magnesium hydroxide (MILK OF MAGNESIA) 400 MG/5ML suspension, Take 15 mLs by mouth daily as needed for mild constipation., Disp: , Rfl:  .  meloxicam (MOBIC) 15 MG tablet, Take 1 tablet (15 mg total) by mouth daily., Disp: 60 tablet, Rfl: 1 .  ondansetron (ZOFRAN) 4 MG tablet, Take 1 tablet (4 mg total) by mouth every 8 (eight) hours as needed for nausea or vomiting., Disp: 20 tablet, Rfl: 0 .  oxyCODONE-acetaminophen (PERCOCET) 5-325 MG tablet, Take 1 tablet by mouth every 8 (eight) hours as needed for severe pain., Disp: 30 tablet, Rfl: 0 .  triamterene-hydrochlorothiazide (MAXZIDE-25) 37.5-25 MG per tablet, Take 1 tablet by mouth daily., Disp: , Rfl:  Social History   Socioeconomic History  . Marital status: Married    Spouse name: Not on file  . Number of children: 3  . Years of education: Not on file  . Highest education level: Not on file  Occupational  History  . Not on file  Social Needs  . Financial resource strain: Not on file  . Food insecurity:    Worry: Not on file    Inability: Not on file  . Transportation needs:    Medical: Not on file    Non-medical: Not on file  Tobacco Use  . Smoking status: Current Every Day Smoker    Packs/day: 0.50    Years: 20.00    Pack years: 10.00    Types: Cigarettes  . Smokeless tobacco: Never Used  Substance and Sexual Activity  . Alcohol use: No  . Drug use: No  . Sexual activity: Yes  Lifestyle  . Physical activity:    Days per week: Not on file    Minutes per session: Not on file  . Stress: Not on file  Relationships  . Social connections:    Talks on phone: Not on file    Gets  together: Not on file    Attends religious service: Not on file    Active member of club or organization: Not on file    Attends meetings of clubs or organizations: Not on file    Relationship status: Not on file  . Intimate partner violence:    Fear of current or ex partner: Not on file    Emotionally abused: Not on file    Physically abused: Not on file    Forced sexual activity: Not on file  Other Topics Concern  . Not on file  Social History Narrative  . Not on file   Family History  Problem Relation Age of Onset  . Diabetes Mother   . Stroke Mother   . Alcohol abuse Father   . Stroke Father   . Colon cancer Neg Hx     Objective: Office vital signs reviewed. BP 119/76   Pulse 79   Temp 98.6 F (37 C) (Oral)   Ht 5\' 6"  (1.676 m)   BMI 38.25 kg/m   Physical Examination:  General: Awake, alert, well nourished, well appearing female. No acute distress HEENT: Normal    Eyes: PERRLA, extraocular membranes intact, sclera white Cardio: regular rate and rhythm, S1S2 heard, no murmurs appreciated Pulm: clear to auscultation bilaterally, no wheezes, rhonchi or rales; normal work of breathing on room air Extremities: Right: warm, well perfused, No edema, cyanosis or clubbing; +2 pulses bilaterally; L: in a walking boot. MSK: antalgic gait and station; using a rolling walker for ambulation  Assessment/ Plan: 67 y.o. female   1. New onset type 2 diabetes mellitus (HCC) A1c is actually increased since last visit.  Today her A1c was 7.3%.  I did offer to refer her back to nutrition again but patient declined this today.  She would like to go ahead and pursue medication.  Metformin 500 mg twice daily prescribed.  I instructed her to start this at 500 mg daily and then titrate up to twice daily dosing in 2 weeks.  Caution side effect of nausea and diarrhea.  She will follow-up in 3 months with me or sooner if needed for blood sugar recheck.  We will plan to do diabetic foot exam at  that time.  Hopefully she will be out of her orthopedic boot.  She will schedule an eye exam when she is due and have them send me the records.  May need pneumococcal vaccine as well given new onset diabetes.  We will also plan for urine microalbumin at next visit, she is not currently on an  ACE or an ARB. - Bayer DCA Hb A1c Waived  2. Benign essential hypertension Well-controlled.  Her medications have been refilled.  3. Hyperlipidemia associated with type 2 diabetes mellitus (HCC) Currently on Lipitor 40 mg.  We will plan for direct LDL at next visit given increase Lipitor dose.   Orders Placed This Encounter  Procedures  . Bayer DCA Hb A1c Waived   Meds ordered this encounter  Medications  . metFORMIN (GLUCOPHAGE) 500 MG tablet    Sig: Take 1 tablet (500 mg total) by mouth 2 (two) times daily with a meal.    Dispense:  180 tablet    Refill:  0  . triamterene-hydrochlorothiazide (MAXZIDE-25) 37.5-25 MG tablet    Sig: Take 1 tablet by mouth daily.    Dispense:  90 tablet    Refill:  4     Jennifer Pyeatt Hulen Skains, DO [facility] [phone]

## 2018-01-03 ENCOUNTER — Telehealth: Payer: Self-pay | Admitting: Podiatry

## 2018-01-03 ENCOUNTER — Telehealth: Payer: Self-pay | Admitting: *Deleted

## 2018-01-03 DIAGNOSIS — T148XXA Other injury of unspecified body region, initial encounter: Secondary | ICD-10-CM

## 2018-01-03 DIAGNOSIS — Z9889 Other specified postprocedural states: Secondary | ICD-10-CM

## 2018-01-03 DIAGNOSIS — M76822 Posterior tibial tendinitis, left leg: Secondary | ICD-10-CM

## 2018-01-03 NOTE — Telephone Encounter (Signed)
Hand-delivered [employer] referral form.

## 2018-01-03 NOTE — Addendum Note (Signed)
Addended by: Alphia Kava'CONNELL, VALERY D on: 01/03/2018 02:16 PM   Modules accepted: Orders

## 2018-01-03 NOTE — Telephone Encounter (Signed)
I informed pt [facility] should be calling her soon to schedule PT.

## 2018-01-03 NOTE — Telephone Encounter (Signed)
[facility] and needs [facility]-bearing [facility].

## 2018-01-03 NOTE — Telephone Encounter (Signed)
Pt is wondering about her PT. She needs to get in as soon as possible. Please giver her a call.

## 2018-01-05 DIAGNOSIS — M25572 Pain in left ankle and joints of left foot: Secondary | ICD-10-CM | POA: Diagnosis not present

## 2018-01-05 DIAGNOSIS — M25672 Stiffness of left ankle, not elsewhere classified: Secondary | ICD-10-CM | POA: Diagnosis not present

## 2018-01-05 DIAGNOSIS — M25472 Effusion, left ankle: Secondary | ICD-10-CM | POA: Diagnosis not present

## 2018-01-05 DIAGNOSIS — M6281 Muscle weakness (generalized): Secondary | ICD-10-CM | POA: Diagnosis not present

## 2018-01-05 NOTE — Progress Notes (Signed)
   Subjective:  Patient presents today status post PT tendon repair of the left foot. DOS: 11/11/17. She states she is doing well overall. She notes some soreness along the incision site with associated drainage. She states she is now able to walk better without issue. She denies modifying factors. She has been using the CAM boot as directed. Patient is here for further evaluation and treatment.   Past Medical History:  Diagnosis Date  . Arthritis   . Dyspnea   . Hyperlipidemia   . Hypertension       Objective/Physical Exam Neurovascular status intact.  Skin incisions appear to be healed. No sign of infectious process noted. Moderate edema noted to the surgical extremity.   Assessment: 1. s/p PT tendon repair left. DOS: 11/11/17   Plan of Care:  1. Patient was evaluated.  2. Orders for physical therapy three times a week for four weeks placed.  3. Scheduled return to work on October 9th.  4. Continue weightbearing in CAM boot for one week. Transition into good sneakers afterwards.  5. Return to clinic in 4 weeks.    Felecia ShellingBrent M. Geanine Vandekamp, DPM [facility]  Dr. Felecia ShellingBrent M. Odarius Dines, DPM    [address]                                        [place], [place] [address]                Office [phone]  Fax [phone]

## 2018-01-05 NOTE — Telephone Encounter (Signed)
Dr. Logan BoresEvans states pt has not weight bearing restrictions. [facility]

## 2018-01-05 NOTE — Progress Notes (Signed)
DOS: 11-11-2017 Repair posterior tibial tendon Left  [facility]

## 2018-01-06 DIAGNOSIS — M25472 Effusion, left ankle: Secondary | ICD-10-CM | POA: Diagnosis not present

## 2018-01-06 DIAGNOSIS — M25572 Pain in left ankle and joints of left foot: Secondary | ICD-10-CM | POA: Diagnosis not present

## 2018-01-06 DIAGNOSIS — M25672 Stiffness of left ankle, not elsewhere classified: Secondary | ICD-10-CM | POA: Diagnosis not present

## 2018-01-06 DIAGNOSIS — M6281 Muscle weakness (generalized): Secondary | ICD-10-CM | POA: Diagnosis not present

## 2018-01-10 ENCOUNTER — Ambulatory Visit (INDEPENDENT_AMBULATORY_CARE_PROVIDER_SITE_OTHER): Payer: 59 | Admitting: Podiatry

## 2018-01-10 DIAGNOSIS — M76822 Posterior tibial tendinitis, left leg: Secondary | ICD-10-CM | POA: Diagnosis not present

## 2018-01-10 DIAGNOSIS — Z9889 Other specified postprocedural states: Secondary | ICD-10-CM

## 2018-01-10 DIAGNOSIS — T148XXA Other injury of unspecified body region, initial encounter: Secondary | ICD-10-CM

## 2018-01-11 DIAGNOSIS — M25672 Stiffness of left ankle, not elsewhere classified: Secondary | ICD-10-CM | POA: Diagnosis not present

## 2018-01-11 DIAGNOSIS — M25472 Effusion, left ankle: Secondary | ICD-10-CM | POA: Diagnosis not present

## 2018-01-11 DIAGNOSIS — M6281 Muscle weakness (generalized): Secondary | ICD-10-CM | POA: Diagnosis not present

## 2018-01-11 DIAGNOSIS — M25572 Pain in left ankle and joints of left foot: Secondary | ICD-10-CM | POA: Diagnosis not present

## 2018-01-11 NOTE — Progress Notes (Signed)
   Subjective:  Patient presents today status post PT tendon repair of the left foot. DOS: 11/11/17. She states she is doing well overall. She reports an improvement in the pain and denies any modifying factors. She has been using the CAM boot as directed and is ready to wear regular shoes. Patient is here for further evaluation and treatment.   Past Medical History:  Diagnosis Date  . Arthritis   . Dyspnea   . Hyperlipidemia   . Hypertension       Objective/Physical Exam Neurovascular status intact.  Skin incisions appear to be healed. No sign of infectious process noted. Moderate edema noted to the surgical extremity.   Assessment: 1. s/p PT tendon repair left. DOS: 11/11/17   Plan of Care:  1. Patient was evaluated.  2. Ankle brace dispensed. Discontinue using CAM boot.   3. Scheduled return to work on October 9th. Light duty only.  4. Continue physical therapy twice weekly for one week.  5. Return to clinic in 2 weeks.   Patient retiring at the end of this year.    Felecia ShellingBrent M. Anahi Belmar, DPM [facility]  Dr. Felecia ShellingBrent M. Kelda Azad, DPM    [address]                                        [place], [place] [address]                Office [phone]  Fax [phone]) [phone]

## 2018-01-13 ENCOUNTER — Encounter: Payer: Self-pay | Admitting: *Deleted

## 2018-01-13 DIAGNOSIS — M25672 Stiffness of left ankle, not elsewhere classified: Secondary | ICD-10-CM | POA: Diagnosis not present

## 2018-01-13 DIAGNOSIS — M25572 Pain in left ankle and joints of left foot: Secondary | ICD-10-CM | POA: Diagnosis not present

## 2018-01-13 DIAGNOSIS — M6281 Muscle weakness (generalized): Secondary | ICD-10-CM | POA: Diagnosis not present

## 2018-01-13 DIAGNOSIS — M25472 Effusion, left ankle: Secondary | ICD-10-CM | POA: Diagnosis not present

## 2018-01-18 DIAGNOSIS — M6281 Muscle weakness (generalized): Secondary | ICD-10-CM | POA: Diagnosis not present

## 2018-01-18 DIAGNOSIS — M25672 Stiffness of left ankle, not elsewhere classified: Secondary | ICD-10-CM | POA: Diagnosis not present

## 2018-01-18 DIAGNOSIS — M25472 Effusion, left ankle: Secondary | ICD-10-CM | POA: Diagnosis not present

## 2018-01-18 DIAGNOSIS — M25572 Pain in left ankle and joints of left foot: Secondary | ICD-10-CM | POA: Diagnosis not present

## 2018-01-20 DIAGNOSIS — M6281 Muscle weakness (generalized): Secondary | ICD-10-CM | POA: Diagnosis not present

## 2018-01-20 DIAGNOSIS — M25672 Stiffness of left ankle, not elsewhere classified: Secondary | ICD-10-CM | POA: Diagnosis not present

## 2018-01-20 DIAGNOSIS — M25472 Effusion, left ankle: Secondary | ICD-10-CM | POA: Diagnosis not present

## 2018-01-20 DIAGNOSIS — M25572 Pain in left ankle and joints of left foot: Secondary | ICD-10-CM | POA: Diagnosis not present

## 2018-01-24 ENCOUNTER — Ambulatory Visit (INDEPENDENT_AMBULATORY_CARE_PROVIDER_SITE_OTHER): Payer: 59 | Admitting: Podiatry

## 2018-01-24 ENCOUNTER — Encounter: Payer: Self-pay | Admitting: Podiatry

## 2018-01-24 DIAGNOSIS — M76822 Posterior tibial tendinitis, left leg: Secondary | ICD-10-CM

## 2018-01-24 DIAGNOSIS — E782 Mixed hyperlipidemia: Secondary | ICD-10-CM | POA: Diagnosis not present

## 2018-01-24 DIAGNOSIS — Z9889 Other specified postprocedural states: Secondary | ICD-10-CM

## 2018-01-25 ENCOUNTER — Telehealth: Payer: Self-pay | Admitting: *Deleted

## 2018-01-25 LAB — HEPATIC FUNCTION PANEL
ALT: 14 IU/L (ref 0–32)
AST: 15 IU/L (ref 0–40)
Albumin: 4.3 g/dL (ref 3.6–4.8)
Alkaline Phosphatase: 116 IU/L (ref 39–117)
Bilirubin Total: 0.4 mg/dL (ref 0.0–1.2)
Bilirubin, Direct: 0.12 mg/dL (ref 0.00–0.40)
Total Protein: 7.1 g/dL (ref 6.0–8.5)

## 2018-01-25 LAB — LIPID PANEL
CHOLESTEROL TOTAL: 151 mg/dL (ref 100–199)
Chol/HDL Ratio: 4 ratio (ref 0.0–4.4)
HDL: 38 mg/dL — AB (ref >39)
LDL Calculated: 71 mg/dL (ref 0–99)
TRIGLYCERIDES: 208 mg/dL — AB (ref 0–149)
VLDL CHOLESTEROL CAL: 42 mg/dL — AB (ref 5–40)

## 2018-01-25 NOTE — Telephone Encounter (Signed)
Follow Up:    Patient is returning a call about lab results

## 2018-01-25 NOTE — Telephone Encounter (Signed)
Left message to call back  

## 2018-01-25 NOTE — Telephone Encounter (Signed)
-----   Message from Runell Gess, MD sent at 01/25/2018  7:59 AM EDT ----- Excellent FLP and LFTs

## 2018-01-25 NOTE — Telephone Encounter (Signed)
Advised patient of lab results  

## 2018-01-26 NOTE — Progress Notes (Signed)
   Subjective:  Patient presents today status post PT tendon repair of the left foot. DOS: 11/11/17. She reports some continued soreness. She has been icing the area and using the CAM boot as directed. She denies any new complaints at this time. Patient is here for further evaluation and treatment.   Past Medical History:  Diagnosis Date  . Arthritis   . Dyspnea   . Hyperlipidemia   . Hypertension       Objective/Physical Exam Neurovascular status intact.  Skin incisions appear to be healed. No sign of infectious process noted. Moderate edema noted to the surgical extremity.   Assessment: 1. s/p PT tendon repair left. DOS: 11/11/17   Plan of Care:  1. Patient was evaluated.  2. Return to work light duty for four weeks.  3. Recommended good shoe gear.  4. Continue using compression anklet.  5. Return to clinic in 8 weeks.   Patient retiring at the end of this year.    Felecia Shelling, DPM [facility]  Dr. Felecia Shelling, DPM    [address]                                        [place], [place] [address]                Office [phone]  Fax [phone]

## 2018-02-01 MED FILL — MELOXICAM 15 MG TABLET: 15 | 60 days supply | Qty: 60 | Fill #0

## 2018-02-09 ENCOUNTER — Encounter: Payer: Self-pay | Admitting: Podiatry

## 2018-02-09 ENCOUNTER — Ambulatory Visit (INDEPENDENT_AMBULATORY_CARE_PROVIDER_SITE_OTHER): Payer: 59 | Admitting: Podiatry

## 2018-02-09 DIAGNOSIS — Z9889 Other specified postprocedural states: Secondary | ICD-10-CM

## 2018-02-09 DIAGNOSIS — M76822 Posterior tibial tendinitis, left leg: Secondary | ICD-10-CM

## 2018-02-09 MED FILL — ATENOLOL 25 MG TABLET: 25 | 90 days supply | Qty: 90 | Fill #2

## 2018-02-13 NOTE — Progress Notes (Signed)
   Subjective:  Patient presents today status post PT tendon repair of the left foot. DOS: 11/11/17. She states she is doing well and the soreness is not as bad as it was previously. Walking does still cause some increased tenderness. She is still continuing to ice there area for treatment. Patient is here for further evaluation and treatment.   Past Medical History:  Diagnosis Date  . Arthritis   . Dyspnea   . Hyperlipidemia   . Hypertension       Objective/Physical Exam Neurovascular status intact.  Skin incisions appear to be healed. No sign of infectious process noted. Moderate edema noted to the surgical extremity.   Assessment: 1. s/p PT tendon repair left. DOS: 11/11/17   Plan of Care:  1. Patient was evaluated.  2. Return to work light duty for four hours daily for four weeks.  3. Continue wearing ankle brace.  4. Return to clinic in 4 weeks.    Patient retiring at the end of this year.    Felecia Shelling, DPM [facility]  Dr. Felecia Shelling, DPM    [address]                                        [place], [place] [address]                Office [phone]  Fax [phone]

## 2018-03-09 ENCOUNTER — Encounter: Payer: Self-pay | Admitting: Podiatry

## 2018-03-09 ENCOUNTER — Ambulatory Visit: Payer: 59 | Admitting: Podiatry

## 2018-03-09 DIAGNOSIS — M76822 Posterior tibial tendinitis, left leg: Secondary | ICD-10-CM | POA: Diagnosis not present

## 2018-03-09 DIAGNOSIS — M778 Other enthesopathies, not elsewhere classified: Secondary | ICD-10-CM

## 2018-03-09 DIAGNOSIS — M779 Enthesopathy, unspecified: Secondary | ICD-10-CM | POA: Diagnosis not present

## 2018-03-10 ENCOUNTER — Telehealth: Payer: Self-pay | Admitting: Podiatry

## 2018-03-10 NOTE — Telephone Encounter (Signed)
I called back in reference to claim# [id number] and gave them the fax number of [phone] to send the request to.

## 2018-03-10 NOTE — Telephone Encounter (Signed)
Jennifer RuderKara with [employer] called wanting to know if we received fax request for office visit notes date of service 09 March 2018 for long term disability. If not, pleased call back [phone].

## 2018-03-13 NOTE — Progress Notes (Signed)
   Subjective:  Patient presents today status post PT tendon repair of the left foot. DOS: 11/11/17. She reports continued pain. She states the pain is now located laterally due to overcompensation. Walking increases the pain. She has not done anything for treatment. Patient is here for further evaluation and treatment.   Past Medical History:  Diagnosis Date  . Arthritis   . Dyspnea   . Hyperlipidemia   . Hypertension       Objective/Physical Exam Neurovascular status intact.  Skin incisions appear to be healed. No sign of infectious process noted. Moderate edema noted to the surgical extremity. Pain with palpation to the lateral left foot.    Assessment: 1. s/p PT tendon repair left. DOS: 11/11/17 2. Capsulitis lateral left foot secondary to compensation    Plan of Care:  1. Patient was evaluated.  2. Injection of 0.5 mLs Celestone Soluspan injected into lateral left foot.  3. Patient would benefit from physical therapy again. Patient will call to initial when she has the finances.  4. Continue light duty at work four hours daily for 6 weeks.  5. Return to clinic in 6 weeks.    Patient retiring at the end of this year.    Felecia ShellingBrent M. Evans, DPM [facility]  Dr. Felecia ShellingBrent M. Evans, DPM    [address]                                        [place], [place] [address]                Office [phone]  Fax [phone]) [phone]

## 2018-03-21 MED FILL — ATORVASTATIN 40 MG TABLET: 40 | 60 days supply | Qty: 60 | Fill #3

## 2018-03-21 MED FILL — TRIAMTERENE/HCTZ 37.5/25 TB: 37.5-25 | 90 days supply | Qty: 90 | Fill #1

## 2018-03-28 ENCOUNTER — Ambulatory Visit: Payer: 59 | Admitting: Podiatry

## 2018-04-01 MED FILL — MELOXICAM 15 MG TABLET: 15 | 60 days supply | Qty: 60 | Fill #1

## 2018-04-06 ENCOUNTER — Ambulatory Visit: Payer: 59 | Admitting: Family Medicine

## 2018-04-18 ENCOUNTER — Ambulatory Visit: Payer: 59 | Admitting: Podiatry

## 2018-04-28 ENCOUNTER — Encounter: Payer: Self-pay | Admitting: Family Medicine

## 2018-04-28 ENCOUNTER — Ambulatory Visit (INDEPENDENT_AMBULATORY_CARE_PROVIDER_SITE_OTHER): Payer: 59 | Admitting: Family Medicine

## 2018-04-28 VITALS — BP 118/85 | HR 84 | Temp 97.9°F | Ht 66.0 in | Wt 232.0 lb

## 2018-04-28 DIAGNOSIS — E1151 Type 2 diabetes mellitus with diabetic peripheral angiopathy without gangrene: Secondary | ICD-10-CM | POA: Diagnosis not present

## 2018-04-28 DIAGNOSIS — I152 Hypertension secondary to endocrine disorders: Secondary | ICD-10-CM

## 2018-04-28 DIAGNOSIS — E1159 Type 2 diabetes mellitus with other circulatory complications: Secondary | ICD-10-CM | POA: Diagnosis not present

## 2018-04-28 DIAGNOSIS — E785 Hyperlipidemia, unspecified: Secondary | ICD-10-CM | POA: Diagnosis not present

## 2018-04-28 DIAGNOSIS — I10 Essential (primary) hypertension: Secondary | ICD-10-CM | POA: Diagnosis not present

## 2018-04-28 DIAGNOSIS — E1169 Type 2 diabetes mellitus with other specified complication: Secondary | ICD-10-CM

## 2018-04-28 LAB — BAYER DCA HB A1C WAIVED: HB A1C (BAYER DCA - WAIVED): 6.7 % (ref <7.0)

## 2018-04-28 NOTE — Progress Notes (Signed)
Subjective: CC: follow up DM2 PCP: Raliegh IpGottschalk,  M, DO XBJ:YNWGNHPI:Ashla W Normand SloopDillard is a 68 y.o. female presenting to clinic today for:  1. Type 2 Diabetes w/ HTN and HLD Patient was last seen 3 months ago for diabetes.  She is currently treated with metformin 500 mg p.o. twice daily, Lipitor 40 mg nightly, Maxide 37.5-25 and atenolol 25 mg daily.  She notes that she has only been taking metformin 500 mg once daily, as she often forgets her evening dose.  Additionally, she feels like the second dose in the evening tends to cause some GI upset.  Last eye exam: March or April of last year.  She is [place] in [place] as [place].  She is seen for primary eye care in [place]. Last foot exam: Needs Last A1c: 7.3 in 12/2017 Nephropathy screen indicated?:  Needs Last flu, zoster and/or pneumovax: Needs Flu/ TDap but declines today  ROS: Denies unintended weight loss/gain, foot ulcerations, numbness or tingling in extremities or chest pain.  Allergies  Allergen Reactions  . Hydrocodone Itching  . Oxycodone Nausea And Vomiting    Pt stated, "It makes me not want to eat"   Past Medical History:  Diagnosis Date  . Arthritis   . Dyspnea   . Hyperlipidemia   . Hypertension     Current Outpatient Medications:  .  acetaminophen (TYLENOL) 500 MG tablet, Take 1,000 mg by mouth 2 (two) times daily as needed for mild pain or moderate pain. , Disp: , Rfl:  .  atenolol (TENORMIN) 25 MG tablet, Take 1 tablet (25 mg total) by mouth daily., Disp: 90 tablet, Rfl: 4 .  atorvastatin (LIPITOR) 40 MG tablet, Take 1 tablet (40 mg total) by mouth at bedtime., Disp: 30 tablet, Rfl: 6 .  Coenzyme Q-10 100 MG capsule, Take 2 capsules (200 mg total) by mouth daily., Disp: 60 capsule, Rfl: 6 .  magnesium hydroxide (MILK OF MAGNESIA) 400 MG/5ML suspension, Take 15 mLs by mouth daily as needed for mild constipation., Disp: , Rfl:  .  meloxicam (MOBIC) 15 MG tablet, Take 1 tablet (15 mg total) by  mouth daily., Disp: 60 tablet, Rfl: 1 .  metFORMIN (GLUCOPHAGE) 500 MG tablet, Take 1 tablet (500 mg total) by mouth 2 (two) times daily with a meal., Disp: 180 tablet, Rfl: 0 .  triamterene-hydrochlorothiazide (MAXZIDE-25) 37.5-25 MG tablet, Take 1 tablet by mouth daily., Disp: 90 tablet, Rfl: 4 Social History   Socioeconomic History  . Marital status: Married    Spouse name: Not on file  . Number of children: 3  . Years of education: Not on file  . Highest education level: Not on file  Occupational History  . Not on file  Social Needs  . Financial resource strain: Not on file  . Food insecurity:    Worry: Not on file    Inability: Not on file  . Transportation needs:    Medical: Not on file    Non-medical: Not on file  Tobacco Use  . Smoking status: Current Every Day Smoker    Packs/day: 0.50    Years: 20.00    Pack years: 10.00    Types: Cigarettes  . Smokeless tobacco: Never Used  Substance and Sexual Activity  . Alcohol use: No  . Drug use: No  . Sexual activity: Yes  Lifestyle  . Physical activity:    Days per week: Not on file    Minutes per session: Not on file  .  Stress: Not on file  Relationships  . Social connections:    Talks on phone: Not on file    Gets together: Not on file    Attends religious service: Not on file    Active member of club or organization: Not on file    Attends meetings of clubs or organizations: Not on file    Relationship status: Not on file  . Intimate partner violence:    Fear of current or ex partner: Not on file    Emotionally abused: Not on file    Physically abused: Not on file    Forced sexual activity: Not on file  Other Topics Concern  . Not on file  Social History Narrative  . Not on file   Family History  Problem Relation Age of Onset  . Diabetes Mother   . Stroke Mother   . Alcohol abuse Father   . Stroke Father   . Colon cancer Neg Hx     Objective: Office vital signs reviewed. BP 118/85   Pulse 84    Temp 97.9 F (36.6 C) (Oral)   Ht 5\' 6"  (1.676 m)   Wt 232 lb (105.2 kg)   BMI 37.45 kg/m   Physical Examination:  General: Awake, alert, well nourished, well appearing female. No acute distress HEENT: Normal    Eyes: PERRLA, extraocular membranes intact, sclera white Cardio: regular rate and rhythm, S1S2 heard, no murmurs appreciated Pulm: clear to auscultation bilaterally, no wheezes, rhonchi or rales; normal work of breathing on room air Extremities: Warm, well-perfused.  No edema, cyanosis or clubbing.  She has a well-healed scar along the medial aspect of the left ankle. Neuro: Diabetic foot exam below  Lab Results  Component Value Date   HGBA1C 7.3 (H) 12/31/2017   Diabetic Foot Exam - Simple   Simple Foot Form Diabetic Foot exam was performed with the following findings:  Yes 04/28/2018  3:08 PM  Visual Inspection No deformities, no ulcerations, no other skin breakdown bilaterally:  Yes Sensation Testing Intact to touch and monofilament testing bilaterally:  Yes Pulse Check Posterior Tibialis and Dorsalis pulse intact bilaterally:  Yes Comments Normal vibratory sense.     Assessment/ Plan: 68 y.o. female   1. Type 2 diabetes mellitus with peripheral circulatory disorder (HCC) Blood sugars under excellent control with 500 mg of metformin daily.  Okay to continue this regimen.  I will update her medication list to reflect this.  We discussed that if blood sugar starts to increase again that we may consider adding something like Januvia instead of increasing dose of metformin since she has had GI side effects.  Her diabetic foot exam was performed today.  She will get her diabetic eye exam done and sent to the office.  She declined pneumococcal vaccination today.  I counseled her on this. - Bayer DCA Hb A1c Waived - Microalbumin / creatinine urine ratio  2. Hyperlipidemia associated with type 2 diabetes mellitus (HCC) Last LDL was about a 50% reduction from previous  check.  Continue Lipitor 40 mg nightly.  3. Hypertension associated with diabetes (HCC) Under good control with current regimen.  We will perform urine microalbumin to further evaluate. - Microalbumin / creatinine urine ratio  4. Morbid obesity (HCC)   Orders Placed This Encounter  Procedures  . Bayer DCA Hb A1c Waived  . Microalbumin / creatinine urine ratio   Meds ordered this encounter  Medications  . metFORMIN (GLUCOPHAGE) 500 MG tablet    Sig: Take 1  tablet (500 mg total) by mouth daily with breakfast.    Dispense:  90 tablet    Refill:  1      Hulen Skains, DO [facility] [phone]

## 2018-04-28 NOTE — Patient Instructions (Signed)
Your blood sugar is considered at goal with A1c of less than 7.  Keep up the good work.  You may continue metformin 500 mg daily if this is what you have been doing.  If you start seeing that the blood sugars are increasing we should increase back to 500 mg twice daily or we can put you on the extended release version so you only have to take 1 pill/day.  Make sure that your eye doctor send me your eye exam result.  Continue to work on weight loss by modifying diet and increasing physical activity.  Think about the pneumococcal vaccine, as it is recommended.

## 2018-04-29 LAB — MICROALBUMIN / CREATININE URINE RATIO
Creatinine, Urine: 77.1 mg/dL
MICROALBUM., U, RANDOM: 8.5 ug/mL
Microalb/Creat Ratio: 11 mg/g creat (ref 0.0–30.0)

## 2018-04-29 MED ORDER — METFORMIN HCL 500 MG PO TABS: 500.0000 mg | ORAL_TABLET | Freq: Every day | ORAL | 1 refills | Status: DC

## 2018-05-02 ENCOUNTER — Ambulatory Visit: Payer: 59 | Admitting: Podiatry

## 2018-05-02 DIAGNOSIS — M779 Enthesopathy, unspecified: Secondary | ICD-10-CM | POA: Diagnosis not present

## 2018-05-02 DIAGNOSIS — M778 Other enthesopathies, not elsewhere classified: Secondary | ICD-10-CM

## 2018-05-02 DIAGNOSIS — Z9889 Other specified postprocedural states: Secondary | ICD-10-CM

## 2018-05-02 DIAGNOSIS — M76822 Posterior tibial tendinitis, left leg: Secondary | ICD-10-CM | POA: Diagnosis not present

## 2018-05-04 ENCOUNTER — Telehealth: Payer: Self-pay | Admitting: Podiatry

## 2018-05-04 NOTE — Telephone Encounter (Signed)
Entered in error

## 2018-05-04 NOTE — Progress Notes (Signed)
   Subjective:  Patient presents today status post PT tendon repair of the left foot. DOS: 11/11/17. She reports she is doing well overall. She reports continued pain in the early morning when she first gets up. She has been stretching which helps alleviate her symptoms. Patient is here for further evaluation and treatment.   Past Medical History:  Diagnosis Date  . Arthritis   . Dyspnea   . Hyperlipidemia   . Hypertension       Objective/Physical Exam Neurovascular status intact.  Skin incisions appear to be healed. No sign of infectious process noted. Moderate edema noted to the surgical extremity.   Assessment: 1. s/p PT tendon repair left. DOS: 11/11/17 2. Capsulitis lateral left foot secondary to compensation - resolved    Plan of Care:  1. Patient was evaluated.  2. Recommended good shoe gear.  3. Continue using compression socks.  4. May resume full activity with no restrictions.  5. Return to clinic as needed.     Patient retiring at the end of this year.    Felecia Shelling, DPM [facility]  Dr. Felecia Shelling, DPM    [address]                                        [place], [place] [address]                Office [phone]  Fax [phone]

## 2018-05-23 ENCOUNTER — Telehealth: Payer: Self-pay | Admitting: Family Medicine

## 2018-05-23 MED ORDER — ATENOLOL 25 MG PO TABS: 25.0000 mg | ORAL_TABLET | Freq: Every day | ORAL | 4 refills | Status: DC

## 2018-05-23 MED ORDER — ATORVASTATIN CALCIUM 40 MG PO TABS: 40.0000 mg | ORAL_TABLET | Freq: Every day | ORAL | 6 refills | Status: DC

## 2018-05-23 NOTE — Telephone Encounter (Signed)
What is the name of the medication?  atenolol (TENORMIN) 25 MG tablet atorvastatin (LIPITOR) 40 MG tablet   Have you contacted your pharmacy to request a refill? no  Which pharmacy would you like this sent to? Pt is [facility] to [facility] in [place]   Patient notified that their request is being sent to the clinical staff for review and that they should receive a call once it is complete. If they do not receive a call within 24 hours they can check with their pharmacy or our office.

## 2018-05-23 NOTE — Telephone Encounter (Signed)
Medications sent to the correct pharmacy left patient a VM

## 2018-06-06 ENCOUNTER — Telehealth: Payer: Self-pay | Admitting: Family Medicine

## 2018-06-06 ENCOUNTER — Telehealth: Payer: Self-pay | Admitting: Podiatry

## 2018-06-06 NOTE — Telephone Encounter (Signed)
Left message informing pt if she was not having any problems she did not need to take the Meloxicam.

## 2018-06-06 NOTE — Telephone Encounter (Signed)
Patient needs to know if she needs to continue to take Meloxicam. Please call patient, she possibly would need a refill.

## 2018-06-06 NOTE — Telephone Encounter (Signed)
Patient aware that Dr. Logan Bores Prescribed Meloxicam and will need to contact that office for a refill

## 2018-06-06 NOTE — Telephone Encounter (Addendum)
Unable to leave a message female voice answering the home phone stated pt was not in.

## 2018-06-08 ENCOUNTER — Telehealth: Payer: Self-pay | Admitting: Podiatry

## 2018-06-08 NOTE — Telephone Encounter (Signed)
Does Dr. Logan Bores want me to continue to take the Meloxicam?

## 2018-06-08 NOTE — Telephone Encounter (Signed)
Left message informing pt Dr. Logan Bores had ordered her to stop the meloxicam.

## 2018-06-08 NOTE — Telephone Encounter (Signed)
Discontinue meloxicam

## 2018-06-10 ENCOUNTER — Telehealth: Payer: Self-pay | Admitting: *Deleted

## 2018-06-10 NOTE — Telephone Encounter (Signed)
Pt called for rx of Ibuprofen 800mg .

## 2018-06-10 NOTE — Telephone Encounter (Signed)
Okay to prescribe Motrin 800. #90. TID. 1 refill. Thanks, Dr. Logan Bores

## 2018-06-10 NOTE — Telephone Encounter (Signed)
I called pt, asked if she was still having pain in the foot, because I had spoken with her concerning the Meloxicam that was for pain and inflammation and she had said she was not having any pain. Pt states she is having pain on and off and doesn't think the foot would ever be well. I offered pt and appt, she states she doesn't want to come in. I told pt that if she was having pain Dr. Logan Bores would evaluate and treat. I told pt I would inform Dr. Logan Bores of her request and if she was not well by the end of 30 days, we would require her to make an appt.

## 2018-06-13 ENCOUNTER — Other Ambulatory Visit: Payer: Self-pay

## 2018-06-13 ENCOUNTER — Telehealth: Payer: Self-pay | Admitting: Podiatry

## 2018-06-13 MED ORDER — IBUPROFEN 800 MG PO TABS: 800.0000 mg | ORAL_TABLET | Freq: Three times a day (TID) | ORAL | 1 refills | Status: DC

## 2018-06-13 MED FILL — IBUPROFEN 800 MG TABS: 800 | 30 days supply | Qty: 90 | Fill #1

## 2018-06-13 NOTE — Progress Notes (Signed)
buprofe

## 2018-06-14 MED ORDER — IBUPROFEN 800 MG PO TABS: 800.0000 mg | ORAL_TABLET | Freq: Three times a day (TID) | ORAL | 1 refills | Status: DC

## 2018-06-14 NOTE — Telephone Encounter (Signed)
I called pt, she states she picked it up at the [facility] yesterday.

## 2018-06-14 NOTE — Telephone Encounter (Signed)
I had asked for 800 ibuprofen for my foot and it has not been called into my pharmacy, [facility] in [place].

## 2018-06-14 NOTE — Addendum Note (Signed)
Addended by: Alphia Kava D on: 06/14/2018 08:54 AM   Modules accepted: Orders

## 2018-07-04 ENCOUNTER — Other Ambulatory Visit: Payer: Self-pay | Admitting: Podiatry

## 2018-07-04 ENCOUNTER — Other Ambulatory Visit: Payer: Self-pay

## 2018-07-04 ENCOUNTER — Ambulatory Visit: Payer: Medicare Other | Admitting: Podiatry

## 2018-07-04 ENCOUNTER — Ambulatory Visit (INDEPENDENT_AMBULATORY_CARE_PROVIDER_SITE_OTHER): Payer: Medicare Other

## 2018-07-04 DIAGNOSIS — M659 Synovitis and tenosynovitis, unspecified: Secondary | ICD-10-CM

## 2018-07-04 DIAGNOSIS — L600 Ingrowing nail: Secondary | ICD-10-CM | POA: Diagnosis not present

## 2018-07-04 DIAGNOSIS — M79672 Pain in left foot: Secondary | ICD-10-CM

## 2018-07-04 MED ORDER — METHYLPREDNISOLONE 4 MG PO TBPK: ORAL_TABLET | ORAL | 0 refills | Status: DC

## 2018-07-04 MED ORDER — GENTAMICIN SULFATE 0.1 % EX CREA: 1.0000 "application " | TOPICAL_CREAM | Freq: Two times a day (BID) | CUTANEOUS | 1 refills | Status: DC

## 2018-07-04 MED FILL — METHYLPREDNISOLONE 4 MG TAB: 4 | 6 days supply | Qty: 21 | Fill #0

## 2018-07-04 MED FILL — GENTAMICIN 0.1% CREAM: 0.1 | 5 days supply | Qty: 15 | Fill #0

## 2018-07-04 NOTE — Patient Instructions (Signed)

## 2018-07-06 NOTE — Progress Notes (Signed)
   Subjective: Patient presents today for evaluation of pain to the medial borders of the bilateral great toes that began about one month ago. Patient is concerned for possible ingrown nail. She also reports pain to the lateral left ankle that began two months ago. She has been using a compression anklet and using her custom orthotics for treatment of the ankle pain. She has not done anything to treat the toe pain. Wearing shoes and walking increases her symptoms. Patient presents today for further treatment and evaluation.  Past Medical History:  Diagnosis Date  . Arthritis   . Dyspnea   . Hyperlipidemia   . Hypertension     Objective:  General: Well developed, nourished, in no acute distress, alert and oriented x3   Dermatology: Skin is warm, dry and supple bilateral. Medial borders of the bilateral great toes appears to be erythematous with evidence of an ingrowing nail. Pain on palpation noted to the border of the nail fold. The remaining nails appear unremarkable at this time. There are no open sores, lesions.  Vascular: Dorsalis Pedis artery and Posterior Tibial artery pedal pulses palpable. No lower extremity edema noted.   Neruologic: Grossly intact via light touch bilateral.  Musculoskeletal: Pain with palpation noted to the lateral aspect of the left ankle joint. Muscular strength within normal limits in all groups bilateral. Normal range of motion noted to all pedal and ankle joints.   Assesement: #1 Paronychia with ingrowing nail medial borders bilateral great toes #2 lateral left ankle synovitis  #3 Incurvated nail  Plan of Care:  1. Patient evaluated.  2. Discussed treatment alternatives and plan of care. Explained nail avulsion procedure and post procedure course to patient. 3. Patient opted for permanent partial nail avulsion to the medial borders of the bilateral great toes.  4. Prior to procedure, local anesthesia infiltration utilized using 3 ml of a 50:50 mixture  of 2% plain lidocaine and 0.5% plain marcaine in a normal hallux block fashion and a betadine prep performed.  5. Partial permanent nail avulsion with chemical matrixectomy performed using 3x30sec applications of phenol followed by alcohol flush.  6. Light dressing applied. 7. Prescription for Gentamicin cream provided to patient to use daily with a bandage.  8. Prescription for Medrol Dose Pak provided to patient. 9. Continue using compression anklet and custom orthotics.  10. Return to clinic in 2 weeks.   Felecia Shelling, DPM [facility]  Dr. Felecia Shelling, DPM    [address]                                        [place], [place] [address]                Office [phone]  Fax [phone]

## 2018-07-11 ENCOUNTER — Other Ambulatory Visit: Payer: Self-pay | Admitting: *Deleted

## 2018-07-11 MED ORDER — TRIAMTERENE-HCTZ 37.5-25 MG PO TABS: 1.0000 | ORAL_TABLET | Freq: Every day | ORAL | 0 refills | Status: DC

## 2018-07-18 ENCOUNTER — Ambulatory Visit: Payer: Medicare Other | Admitting: Podiatry

## 2018-10-10 DIAGNOSIS — E119 Type 2 diabetes mellitus without complications: Secondary | ICD-10-CM | POA: Diagnosis not present

## 2018-10-10 DIAGNOSIS — H25813 Combined forms of age-related cataract, bilateral: Secondary | ICD-10-CM | POA: Diagnosis not present

## 2018-10-10 LAB — HM DIABETES EYE EXAM

## 2018-10-17 ENCOUNTER — Other Ambulatory Visit: Payer: Self-pay | Admitting: Family Medicine

## 2018-10-28 ENCOUNTER — Ambulatory Visit: Payer: 59 | Admitting: Family Medicine

## 2018-11-18 ENCOUNTER — Telehealth: Payer: Self-pay | Admitting: *Deleted

## 2018-11-18 MED ORDER — IBUPROFEN 800 MG PO TABS: 800.0000 mg | ORAL_TABLET | Freq: Three times a day (TID) | ORAL | 1 refills | Status: DC

## 2018-11-18 NOTE — Telephone Encounter (Signed)
Pt states she would like to have a refill of the ibuprofen.

## 2018-11-18 NOTE — Telephone Encounter (Signed)
I told pt that Dr. Trinidad Curet the refills of the Ibuprofen, but he would like to see her if she continued to have problems.

## 2018-11-28 ENCOUNTER — Telehealth: Payer: Self-pay | Admitting: *Deleted

## 2018-11-28 NOTE — Telephone Encounter (Signed)
Pt states she has paper work for disability for when she was out of work. I told pt to bring to the office for [place] to complete.

## 2018-11-28 NOTE — Telephone Encounter (Signed)
Pt states she has paperwork for the doctor to complete.

## 2018-12-05 ENCOUNTER — Other Ambulatory Visit: Payer: Self-pay

## 2018-12-06 ENCOUNTER — Ambulatory Visit (INDEPENDENT_AMBULATORY_CARE_PROVIDER_SITE_OTHER): Payer: Medicare Other | Admitting: Family Medicine

## 2018-12-06 ENCOUNTER — Encounter: Payer: Self-pay | Admitting: Family Medicine

## 2018-12-06 VITALS — BP 99/66 | HR 69 | Temp 98.2°F | Ht 66.0 in | Wt 232.0 lb

## 2018-12-06 DIAGNOSIS — E1151 Type 2 diabetes mellitus with diabetic peripheral angiopathy without gangrene: Secondary | ICD-10-CM

## 2018-12-06 DIAGNOSIS — E1159 Type 2 diabetes mellitus with other circulatory complications: Secondary | ICD-10-CM

## 2018-12-06 DIAGNOSIS — M1811 Unilateral primary osteoarthritis of first carpometacarpal joint, right hand: Secondary | ICD-10-CM

## 2018-12-06 DIAGNOSIS — I152 Hypertension secondary to endocrine disorders: Secondary | ICD-10-CM

## 2018-12-06 DIAGNOSIS — E785 Hyperlipidemia, unspecified: Secondary | ICD-10-CM

## 2018-12-06 DIAGNOSIS — E1169 Type 2 diabetes mellitus with other specified complication: Secondary | ICD-10-CM | POA: Diagnosis not present

## 2018-12-06 DIAGNOSIS — R400 Somnolence: Secondary | ICD-10-CM

## 2018-12-06 DIAGNOSIS — Z23 Encounter for immunization: Secondary | ICD-10-CM | POA: Diagnosis not present

## 2018-12-06 DIAGNOSIS — R0683 Snoring: Secondary | ICD-10-CM

## 2018-12-06 DIAGNOSIS — Z1239 Encounter for other screening for malignant neoplasm of breast: Secondary | ICD-10-CM

## 2018-12-06 DIAGNOSIS — I10 Essential (primary) hypertension: Secondary | ICD-10-CM

## 2018-12-06 LAB — BAYER DCA HB A1C WAIVED: HB A1C (BAYER DCA - WAIVED): 6.6 % (ref <7.0)

## 2018-12-06 MED ORDER — METFORMIN HCL 500 MG PO TABS: 500.0000 mg | ORAL_TABLET | Freq: Every day | ORAL | 1 refills | Status: DC

## 2018-12-06 MED ORDER — ATORVASTATIN CALCIUM 40 MG PO TABS: 40.0000 mg | ORAL_TABLET | Freq: Every day | ORAL | 1 refills | Status: DC

## 2018-12-06 NOTE — Addendum Note (Signed)
Addended byCarrolyn Leigh on: 12/06/2018 04:42 PM   Modules accepted: Orders

## 2018-12-06 NOTE — Progress Notes (Signed)
Subjective: CC: follow up DM2 PCP: Raliegh IpGottschalk, [name], DO ZOX:WRUEAHPI:Jennifer [name] is a 68 y.o. female presenting to clinic today for:  1. Type 2 Diabetes w/ HTN and HLD Patient was last seen 6 months ago for diabetes.  She reports compliance with metformin 500 mg p.o. daily, Lipitor 40 mg nightly, Maxide 37.5-25 and atenolol 25 mg daily.   Last eye exam: needs Last foot exam: UTD Last A1c:  Lab Results  Component Value Date   HGBA1C 6.7 04/28/2018  Nephropathy screen indicated?:  UTD Last flu, zoster and/or pneumovax: PNA  ROS: Denies CP, SOB, foot ulcerations, numbness or tingling in extremities or chest pain.  2.  Arthritis Patient reports pain in her right thumb joint at the base and pain in the right ring finger.  She notes that the right ring finger tends to get stuck.  She does use ibuprofen 800 mg perhaps once daily for treatment of this.  She does report good relief.  3. Snoring Patient reports several month history of snoring.  She notes that her snoring is so loud that her husband no longer sleeps in bed with her.  She does not report any witnessed apneas.  She does not choke or cough at nighttime.  She does report nighttime awakenings but this is typically 1-2 times per night to urinate.  She does wake up with dry mouth every morning.  Allergies  Allergen Reactions  . Hydrocodone Itching  . Oxycodone Nausea And Vomiting    Pt stated, "It makes me not want to eat"   Past Medical History:  Diagnosis Date  . Arthritis   . Dyspnea   . Hyperlipidemia   . Hypertension     Current Outpatient Medications:  .  acetaminophen (TYLENOL) 500 MG tablet, Take 1,000 mg by mouth 2 (two) times daily as needed for mild pain or moderate pain. , Disp: , Rfl:  .  atenolol (TENORMIN) 25 MG tablet, Take 1 tablet (25 mg total) by mouth daily., Disp: 90 tablet, Rfl: 4 .  atorvastatin (LIPITOR) 40 MG tablet, Take 1 tablet (40 mg total) by mouth at bedtime., Disp: 30 tablet, Rfl: 6 .   Coenzyme Q-10 100 MG capsule, Take 2 capsules (200 mg total) by mouth daily., Disp: 60 capsule, Rfl: 6 .  ibuprofen (ADVIL) 800 MG tablet, Take 1 tablet (800 mg total) by mouth 3 (three) times daily. Pt needs an appt prior to future refills., Disp: 90 tablet, Rfl: 1 .  metFORMIN (GLUCOPHAGE) 500 MG tablet, Take 1 tablet (500 mg total) by mouth daily with breakfast., Disp: 90 tablet, Rfl: 1 .  triamterene-hydrochlorothiazide (MAXZIDE-25) 37.5-25 MG tablet, Take 1 tablet by mouth once daily, Disp: 90 tablet, Rfl: 0 Social History   Socioeconomic History  . Marital status: Married    Spouse name: Not on file  . Number of children: 3  . Years of education: Not on file  . Highest education level: Not on file  Occupational History  . Not on file  Social Needs  . Financial resource strain: Not on file  . Food insecurity    Worry: Not on file    Inability: Not on file  . Transportation needs    Medical: Not on file    Non-medical: Not on file  Tobacco Use  . Smoking status: Current Every Day Smoker    Packs/day: 0.50    Years: 20.00    Pack years: 10.00    Types: Cigarettes  . Smokeless tobacco: Never Used  Substance and Sexual Activity  . Alcohol use: No  . Drug use: No  . Sexual activity: Yes  Lifestyle  . Physical activity    Days per week: Not on file    Minutes per session: Not on file  . Stress: Not on file  Relationships  . Social Herbalist on phone: Not on file    Gets together: Not on file    Attends religious service: Not on file    Active member of club or organization: Not on file    Attends meetings of clubs or organizations: Not on file    Relationship status: Not on file  . Intimate partner violence    Fear of current or ex partner: Not on file    Emotionally abused: Not on file    Physically abused: Not on file    Forced sexual activity: Not on file  Other Topics Concern  . Not on file  Social History Narrative  . Not on file   Family History   Problem Relation Age of Onset  . Diabetes Mother   . Stroke Mother   . Alcohol abuse Father   . Stroke Father   . Colon cancer Neg Hx     Objective: Office vital signs reviewed. BP 99/66   Pulse 69   Temp 98.2 F (36.8 C) (Oral)   Ht 5\' 6"  (1.676 m)   Wt 232 lb (105.2 kg)   BMI 37.45 kg/m   Physical Examination:  General: Awake, alert, obese. well appearing female. No acute distress Neck: girth 17.25". Cardio: regular rate and rhythm, S1S2 heard, no murmurs appreciated Pulm: clear to auscultation bilaterally, no wheezes, rhonchi or rales; normal work of breathing on room air Extremities: Warm, well-perfused.  No edema, cyanosis or clubbing.  No joint swelling, redness or palpable deformities.   Assessment/ Plan: 68 y.o. female   1. Type 2 diabetes mellitus with peripheral circulatory disorder (HCC) Under excellent control with A1c of 6.6 today.  Continue current regimen with metformin 500 mg daily. PNA vaccine administered.  ROI for DM eye exam. - Bayer DCA Hb A1c Waived  2. Hyperlipidemia associated with type 2 diabetes mellitus ([place]) Continue statin  3. Hypertension associated with diabetes ([place]) I advised her to hold the triamterene/hydrochlorothiazide.  Like her to monitor blood pressures closely.  We discussed that goal blood pressure is less than 140/90.  She will contact me in 2 weeks with blood pressure readings.  If normotensive, may discontinue triamterene hydrochlorothiazide totally.  We discussed that if her blood pressures rise above 140s over 90s that she is to resume triamterene hydrochlorothiazide and contact me.  4. Morbid obesity ([place]) - Ambulatory referral to Neurology  5. Snoring Concern for obstructive sleep apnea.  Her STOP-BANG score is 6. - Ambulatory referral to Neurology  6. Daytime sleepiness - Ambulatory referral to Neurology  7. Screening for breast cancer - MM 3D SCREEN BREAST BILATERAL; Future  8. Osteoarthritis of right thumb  Stable.     Orders Placed This Encounter  Procedures  . MM 3D SCREEN BREAST BILATERAL    [facility] [place]    Standing Status:   Future    Standing Expiration Date:   02/05/2020    Order Specific Question:   Reason for Exam (SYMPTOM  OR DIAGNOSIS REQUIRED)    Answer:   screening    Order Specific Question:   Preferred imaging location?    Answer:   External  . Bayer DCA Hb  A1c Waived   Meds ordered this encounter  Medications  . metFORMIN (GLUCOPHAGE) 500 MG tablet    Sig: Take 1 tablet (500 mg total) by mouth daily with breakfast.    Dispense:  90 tablet    Refill:  1  . atorvastatin (LIPITOR) 40 MG tablet    Sig: Take 1 tablet (40 mg total) by mouth at bedtime.    Dispense:  90 tablet    Refill:  1     Ashly Hulen SkainsM Gottschalk, DO [facility] [phone]

## 2018-12-06 NOTE — Patient Instructions (Addendum)
Get your Mammogram at the [facility] center. Have your eye doctor send me your eye exam report.  I have referred you for sleep study in [place].  You will be contacted for an appointment.  Hold your Triamterene/ HCTZ pill for 2 weeks.  Keep an eye on BP.  Goal is less than 140/90.  Ideal 120/70.  Call me in 2 weeks with BP log.  Sleep Apnea Sleep apnea is a condition in which breathing pauses or becomes shallow during sleep. Episodes of sleep apnea usually last 10 seconds or longer, and they may occur as many as 20 times an hour. Sleep apnea disrupts your sleep and keeps your body from getting the rest that it needs. This condition can increase your risk of certain health problems, including:  Heart attack.  Stroke.  Obesity.  Diabetes.  Heart failure.  Irregular heartbeat. What are the causes? There are three kinds of sleep apnea:  Obstructive sleep apnea. This kind is caused by a blocked or collapsed airway.  Central sleep apnea. This kind happens when the part of the brain that controls breathing does not send the correct signals to the muscles that control breathing.  Mixed sleep apnea. This is a combination of obstructive and central sleep apnea. The most common cause of this condition is a collapsed or blocked airway. An airway can collapse or become blocked if:  Your throat muscles are abnormally relaxed.  Your tongue and tonsils are larger than normal.  You are overweight.  Your airway is smaller than normal. What increases the risk? You are more likely to develop this condition if you:  Are overweight.  Smoke.  Have a smaller than normal airway.  Are elderly.  Are female.  Drink alcohol.  Take sedatives or tranquilizers.  Have a family history of sleep apnea. What are the signs or symptoms? Symptoms of this condition include:  Trouble staying asleep.  Daytime sleepiness and tiredness.  Irritability.  Loud snoring.  Morning headaches.  Trouble  concentrating.  Forgetfulness.  Decreased interest in sex.  Unexplained sleepiness.  Mood swings.  Personality changes.  Feelings of depression.  Waking up often during the night to urinate.  Dry mouth.  Sore throat. How is this diagnosed? This condition may be diagnosed with:  A medical history.  A physical exam.  A series of tests that are done while you are sleeping (sleep study). These tests are usually done in a sleep lab, but they may also be done at home. How is this treated? Treatment for this condition aims to restore normal breathing and to ease symptoms during sleep. It may involve managing health issues that can affect breathing, such as high blood pressure or obesity. Treatment may include:  Sleeping on your side.  Using a decongestant if you have nasal congestion.  Avoiding the use of depressants, including alcohol, sedatives, and narcotics.  Losing weight if you are overweight.  Making changes to your diet.  Quitting smoking.  Using a device to open your airway while you sleep, such as: ? An oral appliance. This is a custom-made mouthpiece that shifts your lower jaw forward. ? A continuous positive airway pressure (CPAP) device. This device blows air through a mask when you breathe out (exhale). ? A nasal expiratory positive airway pressure (EPAP) device. This device has valves that you put into each nostril. ? A bi-level positive airway pressure (BPAP) device. This device blows air through a mask when you breathe in (inhale) and breathe out (exhale).  Having  surgery if other treatments do not work. During surgery, excess tissue is removed to create a wider airway. It is important to get treatment for sleep apnea. Without treatment, this condition can lead to:  High blood pressure.  Coronary artery disease.  In men, an inability to achieve or maintain an erection (impotence).  Reduced thinking abilities. Follow these instructions at  home: Lifestyle  Make any lifestyle changes that your health care provider recommends.  Eat a healthy, well-balanced diet.  Take steps to lose weight if you are overweight.  Avoid using depressants, including alcohol, sedatives, and narcotics.  Do not use any products that contain nicotine or tobacco, such as cigarettes, e-cigarettes, and chewing tobacco. If you need help quitting, ask your health care provider. General instructions  Take over-the-counter and prescription medicines only as told by your health care provider.  If you were given a device to open your airway while you sleep, use it only as told by your health care provider.  If you are having surgery, make sure to tell your health care provider you have sleep apnea. You may need to bring your device with you.  Keep all follow-up visits as told by your health care provider. This is important. Contact a health care provider if:  The device that you received to open your airway during sleep is uncomfortable or does not seem to be working.  Your symptoms do not improve.  Your symptoms get worse. Get help right away if:  You develop: ? Chest pain. ? Shortness of breath. ? Discomfort in your back, arms, or stomach.  You have: ? Trouble speaking. ? Weakness on one side of your body. ? Drooping in your face. These symptoms may represent a serious problem that is an emergency. Do not wait to see if the symptoms will go away. Get medical help right away. Call your local emergency services (911 in the [place] Do not drive yourself to the hospital. Summary  Sleep apnea is a condition in which breathing pauses or becomes shallow during sleep.  The most common cause is a collapsed or blocked airway.  The goal of treatment is to restore normal breathing and to ease symptoms during sleep. This information is not intended to replace advice given to you by your health care provider. Make sure you discuss any questions you  have with your health care provider. Document Released: 03/27/2002 Document Revised: 01/21/2018 Document Reviewed: 11/30/2017 Elsevier Patient Education  2020 [employer]

## 2018-12-12 ENCOUNTER — Telehealth: Payer: Self-pay | Admitting: Family Medicine

## 2018-12-12 NOTE — Telephone Encounter (Signed)
Wednesday 122/60  Thursday 128/78 Friday 138/88 Sunday 148/93 Monday 151/88   Patient states that she is having headaches as well. Patient started her BP medication back

## 2018-12-12 NOTE — Telephone Encounter (Signed)
Agree with resuming BP meds.  If BP goes back down under 100/60's recommend 1/2ing the pill.

## 2018-12-12 NOTE — Telephone Encounter (Signed)
Patient aware.

## 2018-12-21 ENCOUNTER — Ambulatory Visit (INDEPENDENT_AMBULATORY_CARE_PROVIDER_SITE_OTHER): Payer: Medicare Other | Admitting: Neurology

## 2018-12-21 ENCOUNTER — Other Ambulatory Visit: Payer: Self-pay

## 2018-12-21 ENCOUNTER — Encounter: Payer: Self-pay | Admitting: Neurology

## 2018-12-21 VITALS — BP 130/85 | HR 86 | Ht 66.0 in | Wt 233.0 lb

## 2018-12-21 DIAGNOSIS — G478 Other sleep disorders: Secondary | ICD-10-CM

## 2018-12-21 DIAGNOSIS — F17200 Nicotine dependence, unspecified, uncomplicated: Secondary | ICD-10-CM

## 2018-12-21 DIAGNOSIS — G4719 Other hypersomnia: Secondary | ICD-10-CM

## 2018-12-21 DIAGNOSIS — R351 Nocturia: Secondary | ICD-10-CM

## 2018-12-21 DIAGNOSIS — R51 Headache: Secondary | ICD-10-CM | POA: Diagnosis not present

## 2018-12-21 DIAGNOSIS — R0683 Snoring: Secondary | ICD-10-CM

## 2018-12-21 DIAGNOSIS — G479 Sleep disorder, unspecified: Secondary | ICD-10-CM

## 2018-12-21 DIAGNOSIS — R519 Headache, unspecified: Secondary | ICD-10-CM

## 2018-12-21 DIAGNOSIS — E669 Obesity, unspecified: Secondary | ICD-10-CM

## 2018-12-21 NOTE — Patient Instructions (Signed)

## 2018-12-21 NOTE — Progress Notes (Signed)
Subjective:    Patient ID: Jennifer [name] is a 68 y.o. female.  HPI     Huston Foley, MD, PhD [facility] [address], [address] [address] [place], [place] [address]  Dear Dr. Nadine Counts,  I saw your patient, Jennifer [name], upon your kind request to my sleep clinic today for initial consultation of her sleep disorder, in particular, concern for underlying obstructive sleep apnea.  The patient is unaccompanied today.  As you know, Jennifer [name] is a 68 year old right-handed woman with an underlying medical history of diabetes, hypertension, hyperlipidemia, arthritis with status post right total knee replacement, she is status post left foot tendon surgery last year, smoking, and obesity, who reports loud snoring and excessive daytime somnolence.  I reviewed your office note from 12/06/2018. Her Epworth sleepiness score is 9 out of 24, fatigue severity score is 50 out of 63.  She is married and lives with her husband.  She is retired.  They have 3 children.  She does smoke about half a pack per day and does not utilize alcohol, drinks caffeine in the form of coffee, 2 to 3 cups in the mornings.  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  Their youngest son is 40 and lives with them, he requires peritoneal dialysis.  She has a daughter.  Her oldest son is a Naval architect and was diagnosed with sleep apnea as she recalls but is not sure if he has a CPAP machine.  Her weight has been fairly stable, some fluctuation.  She has some discomfort in her knees at night, right knee did fairly well after her knee replacement.  She wonders if she needs a left knee replacement but is reluctant to consider this.  She does not have any telltale symptoms of restless leg syndrome and has not to twitch her feet or legs at night.  She has nocturia about once or twice per average night and has woken up more so recently with the occasional morning headache.  She has no personal  history of migraine headaches.  Bedtime is generally around 11 and rise time 8.  She would be willing to consider CPAP therapy.  Her Past Medical History Is Significant For: Past Medical History:  Diagnosis Date  . Arthritis   . Dyspnea   . Hyperlipidemia   . Hypertension     Her Past Surgical History Is Significant For: Past Surgical History:  Procedure Laterality Date  . ABDOMINAL HYSTERECTOMY    . COLONOSCOPY N/A 11/25/2016   Procedure: COLONOSCOPY;  Surgeon: Malissa Hippo, MD;  Location: [facility]ITE;  Service: Endoscopy;  Laterality: N/A;  830  . KNEE ARTHROSCOPY     BIL   2010   . LUNG BIOPSY     RIGHT 30 YRS AGO   . TOTAL KNEE ARTHROPLASTY Right 02/12/2014   Procedure: TOTAL KNEE ARTHROPLASTY;  Surgeon: Dannielle Huh, MD;  Location: [facility] OR;  Service: Orthopedics;  Laterality: Right;    Her Family History Is Significant For: Family History  Problem Relation Age of Onset  . Diabetes Mother   . Stroke Mother   . Alcohol abuse Father   . Stroke Father   . Colon cancer Neg Hx     Her Social History Is Significant For: Social History   Socioeconomic History  . Marital status: Married    Spouse name: Not on file  . Number of children: 3  . Years of education: Not on  file  . Highest education level: Not on file  Occupational History  . Not on file  Social Needs  . Financial resource strain: Not on file  . Food insecurity    Worry: Not on file    Inability: Not on file  . Transportation needs    Medical: Not on file    Non-medical: Not on file  Tobacco Use  . Smoking status: Current Every Day Smoker    Packs/day: 0.50    Years: 20.00    Pack years: 10.00    Types: Cigarettes  . Smokeless tobacco: Never Used  Substance and Sexual Activity  . Alcohol use: No  . Drug use: No  . Sexual activity: Yes  Lifestyle  . Physical activity    Days per week: Not on file    Minutes per session: Not on file  . Stress: Not on file  Relationships  . Social  Musicianconnections    Talks on phone: Not on file    Gets together: Not on file    Attends religious service: Not on file    Active member of club or organization: Not on file    Attends meetings of clubs or organizations: Not on file    Relationship status: Not on file  Other Topics Concern  . Not on file  Social History Narrative  . Not on file    Her Allergies Are:  Allergies  Allergen Reactions  . Hydrocodone Itching  . Oxycodone Nausea And Vomiting    Pt stated, "It makes me not want to eat"  :   Her Current Medications Are:  Outpatient Encounter Medications as of 12/21/2018  Medication Sig  . acetaminophen (TYLENOL) 500 MG tablet Take 1,000 mg by mouth 2 (two) times daily as needed for mild pain or moderate pain.   Marland Kitchen. atenolol (TENORMIN) 25 MG tablet Take 1 tablet (25 mg total) by mouth daily.  Marland Kitchen. atorvastatin (LIPITOR) 40 MG tablet Take 1 tablet (40 mg total) by mouth at bedtime.  . Coenzyme Q-10 100 MG capsule Take 2 capsules (200 mg total) by mouth daily.  Marland Kitchen. ibuprofen (ADVIL) 800 MG tablet Take 1 tablet (800 mg total) by mouth 3 (three) times daily. Pt needs an appt prior to future refills.  . metFORMIN (GLUCOPHAGE) 500 MG tablet Take 1 tablet (500 mg total) by mouth daily with breakfast.  . triamterene-hydrochlorothiazide (MAXZIDE-25) 37.5-25 MG tablet Take 1 tablet by mouth once daily   No facility-administered encounter medications on file as of 12/21/2018.   :  Review of Systems:  Out of a complete 14 point review of systems, all are reviewed and negative with the exception of these symptoms as listed below: Review of Systems  Neurological:       Pt presents today to discuss her sleep. Pt has never had a sleep study but does endorse snoring.  Epworth Sleepiness Scale 0= would never doze 1= slight chance of dozing 2= moderate chance of dozing 3= high chance of dozing  Sitting and reading: 1 Watching TV: 1 Sitting inactive in a public place (ex. Theater or meeting):  2 As a passenger in a car for an hour without a break: 2 Lying down to rest in the afternoon: 2 Sitting and talking to someone: 0 Sitting quietly after lunch (no alcohol): 1 In a car, while stopped in traffic: 0 Total: 9     Objective:  Neurological Exam  Physical Exam   Physical Examination:   Vitals:   12/21/18 1045  BP: 130/85  Pulse: 86    General Examination: The patient is a very pleasant 68 y.o. female in no acute distress. She appears well-developed and well-nourished and well groomed.   HEENT: Normocephalic, atraumatic, pupils are equal, round and reactive to light and accommodation. Extraocular tracking is good without limitation to gaze excursion or nystagmus noted. Normal smooth pursuit is noted. Hearing is grossly intact. Face is symmetric with normal facial animation and normal facial sensation. Speech is clear with no dysarthria noted. There is no hypophonia. There is no lip, neck/head, jaw or voice tremor. Neck is supple with full range of passive and active motion. There are no carotid bruits on auscultation. Oropharynx exam reveals: mild mouth dryness, adequate dental hygiene and moderate airway crowding, due to Larger tongue and redundant soft palate.  Mallampati is class III.  Tonsils and uvula were not fully visualized.  Neck circumference is 16-7/8 inches.  Tongue protrudes centrally in palate elevates symmetrically.  She has a mild overbite.   Chest: Clear to auscultation without wheezing, rhonchi or crackles noted.  Heart: S1+S2+0, regular and normal without murmurs, rubs or gallops noted.   Abdomen: Soft, non-tender and non-distended with normal bowel sounds appreciated on auscultation.  Extremities: There is no pitting edema in the distal lower extremities bilaterally.   Skin: Warm and dry without trophic changes noted.  Musculoskeletal: exam reveals no obvious joint deformities, tenderness or joint swelling or erythema, some L knee discomfort.    Neurologically:  Mental status: The patient is awake, alert and oriented in all 4 spheres. Her immediate and remote memory, attention, language skills and fund of knowledge are appropriate. There is no evidence of aphasia, agnosia, apraxia or anomia. Speech is clear with normal prosody and enunciation. Thought process is linear. Mood is normal and affect is normal.  Cranial nerves II - XII are as described above under HEENT exam. In addition: shoulder shrug is normal with equal shoulder height noted. Motor exam: Normal bulk, strength and tone is noted. There is no drift, tremor or rebound. Romberg is negative. Reflexes are 1+. Fine motor skills and coordination: intact with normal finger taps, normal hand movements, normal rapid alternating patting, normal foot taps and normal foot agility.  Cerebellar testing: No dysmetria or intention tremor. There is no truncal or gait ataxia.  Sensory exam: intact to light touch in the upper and lower extremities.  Gait, station and balance: She stands easily. No veering to one side is noted. No leaning to one side is noted. Posture is age-appropriate and stance is narrow based. Gait shows normal stride length and normal pace. No problems turning are noted. She has a slight limp on the left.  Tandem walk is slightly challenging for her.   Assessment and Plan:  In summary, Jennifer [name] is a very pleasant 68 y.o.-year old female with an underlying medical history of diabetes, hypertension, hyperlipidemia, arthritis with status post right total knee replacement, she is status post left foot tendon surgery last year, smoking, and obesity, whose history and physical exam are concerning for obstructive sleep apnea (OSA). I had a long chat with the patient about my findings and the diagnosis of OSA, its prognosis and treatment options. We talked about medical treatments, surgical interventions and non-pharmacological approaches. I explained in particular the risks and  ramifications of untreated moderate to severe OSA, especially with respect to developing cardiovascular disease down the Road, including congestive heart failure, difficult to treat hypertension, cardiac arrhythmias, or stroke. Even type 2 diabetes  has, in part, been linked to untreated OSA. Symptoms of untreated OSA include daytime sleepiness, memory problems, mood irritability and mood disorder such as depression and anxiety, lack of energy, as well as recurrent headaches, especially morning headaches. We talked about smoking cessation and trying to maintain a healthy lifestyle in general, as well as the importance of weight control. I encouraged the patient to eat healthy, exercise daily and keep well hydrated, to keep a scheduled bedtime and wake time routine, to not skip any meals and eat healthy snacks in between meals. I advised the patient not to drive when feeling sleepy. I recommended the following at this time: sleep study.   I explained the sleep test procedure to the patient and also outlined possible surgical and non-surgical treatment options of OSA, including UPPP, using a dental device (which would involve a referral to an ENT surgeon vs. Dentistry, respectively).  I also explained the CPAP treatment option to the patient, who indicated that she would be willing to try CPAP if the need arises. I explained the importance of being compliant with PAP treatment, not only for insurance purposes but primarily to improve Her symptoms, and for the patient's long term health benefit, including to reduce Her cardiovascular risks. I answered all her questions today and the patient was in agreement. I plan to see her back after the sleep study is completed and encouraged her to call with any interim questions, concerns, problems or updates.   Thank you very much for allowing me to participate in the care of this nice patient. If I can be of any further assistance to you please do not hesitate to call me  at [phone].  Sincerely,   Star Age, MD, PhD

## 2019-01-05 DIAGNOSIS — M1712 Unilateral primary osteoarthritis, left knee: Secondary | ICD-10-CM | POA: Diagnosis not present

## 2019-01-05 DIAGNOSIS — M25562 Pain in left knee: Secondary | ICD-10-CM | POA: Diagnosis not present

## 2019-01-12 DIAGNOSIS — M1712 Unilateral primary osteoarthritis, left knee: Secondary | ICD-10-CM | POA: Diagnosis not present

## 2019-01-12 DIAGNOSIS — M25562 Pain in left knee: Secondary | ICD-10-CM | POA: Diagnosis not present

## 2019-01-15 ENCOUNTER — Ambulatory Visit (INDEPENDENT_AMBULATORY_CARE_PROVIDER_SITE_OTHER): Payer: Medicare Other | Admitting: Neurology

## 2019-01-15 DIAGNOSIS — F17200 Nicotine dependence, unspecified, uncomplicated: Secondary | ICD-10-CM

## 2019-01-15 DIAGNOSIS — R519 Headache, unspecified: Secondary | ICD-10-CM

## 2019-01-15 DIAGNOSIS — G4720 Circadian rhythm sleep disorder, unspecified type: Secondary | ICD-10-CM

## 2019-01-15 DIAGNOSIS — G4733 Obstructive sleep apnea (adult) (pediatric): Secondary | ICD-10-CM | POA: Diagnosis not present

## 2019-01-15 DIAGNOSIS — E669 Obesity, unspecified: Secondary | ICD-10-CM

## 2019-01-15 DIAGNOSIS — G479 Sleep disorder, unspecified: Secondary | ICD-10-CM

## 2019-01-15 DIAGNOSIS — G478 Other sleep disorders: Secondary | ICD-10-CM

## 2019-01-15 DIAGNOSIS — R351 Nocturia: Secondary | ICD-10-CM

## 2019-01-15 DIAGNOSIS — R0683 Snoring: Secondary | ICD-10-CM

## 2019-01-15 DIAGNOSIS — G4719 Other hypersomnia: Secondary | ICD-10-CM

## 2019-01-18 DIAGNOSIS — M25562 Pain in left knee: Secondary | ICD-10-CM | POA: Diagnosis not present

## 2019-01-18 DIAGNOSIS — M1712 Unilateral primary osteoarthritis, left knee: Secondary | ICD-10-CM | POA: Diagnosis not present

## 2019-01-23 ENCOUNTER — Other Ambulatory Visit: Payer: Self-pay | Admitting: Family Medicine

## 2019-01-23 ENCOUNTER — Telehealth: Payer: Self-pay

## 2019-01-23 NOTE — Telephone Encounter (Signed)
-----   Message from Star Age, MD sent at 01/23/2019  8:26 AM EDT ----- Patient referred by Dr. Lajuana Ripple, seen by me on 12/21/18, diagnostic PSG on 01/15/19.   Please call and notify the patient that the recent sleep study showed moderate to severe (in REM sleep) obstructive sleep apnea. I recommend treatment for this in the form of CPAP. This will require a repeat sleep study for proper titration and mask fitting and correct monitoring of the oxygen saturations. Please explain to patient. I have placed an order in the chart. Thanks.  Star Age, MD, PhD [facility])

## 2019-01-23 NOTE — Procedures (Signed)
PATIENT'S NAME:  Jennifer [name], Schlatter DOB:      1950-12-21      MR#:    [id number]     DATE OF RECORDING: 01/15/2019 REFERRING M.D.:  Danella Maiers. Nadine Counts, DO Study Performed:   Baseline Polysomnogram HISTORY: 68 year old woman with a history of diabetes, hypertension, hyperlipidemia, arthritis with status post right total knee replacement, she is status post left foot tendon surgery last year, smoking, and obesity, who reports snoring and excessive daytime somnolence. The patient endorsed the Epworth Sleepiness Scale at 9/24 points. The patient's weight 233 pounds with a height of 66 (inches), resulting in a BMI of 37.6 kg/m2. The patient's neck circumference measured 16.8 inches.  CURRENT MEDICATIONS: Tylenol, Tenormin, Lipitor, Coenzyme Q-10, Advil, Glucophage, Maxzide.   PROCEDURE:  This is a multichannel digital polysomnogram utilizing the Somnostar 11.2 system.  Electrodes and sensors were applied and monitored per AASM Specifications.   EEG, EOG, Chin and Limb EMG, were sampled at 200 Hz.  ECG, Snore and Nasal Pressure, Thermal Airflow, Respiratory Effort, CPAP Flow and Pressure, Oximetry was sampled at 50 Hz. Digital video and audio were recorded.      BASELINE STUDY  Lights Out was at 00:03 and Lights On at 07:42.  Total recording time (TRT) was 459.5 minutes, with a total sleep time (TST) of 372.5 minutes.   The patient's sleep latency was 19 minutes.  REM latency was 394.5 minutes, which is markedly delayed. The sleep efficiency was 81.1 %.     SLEEP ARCHITECTURE: WASO (Wake after sleep onset) was 73.5 minutes with 2 longer periods of wakefulness and otherwise mild sleep fragmentation noted. There were 14 minutes in Stage N1, 333.5 minutes Stage N2, 7 minutes Stage N3 and 18 minutes in Stage REM.  The percentage of Stage N1 was 3.8%, Stage N2 was 89.5%, which is markedly increased, Stage N3 was 1.9% and Stage R (REM sleep) was 4.8%, which markedly reduced. The arousals were noted as: 109 were  spontaneous, 21 were associated with PLMs, 51 were associated with respiratory events.  RESPIRATORY ANALYSIS:  There were a total of 97 respiratory events:  26 obstructive apneas, 16 central apneas and 0 mixed apneas with a total of 42 apneas and an apnea index (AI) of 6.8 /hour. There were 55 hypopneas with a hypopnea index of 8.9 /hour. The patient also had 0 respiratory event related arousals (RERAs).      The total APNEA/HYPOPNEA INDEX (AHI) was 15.6/hour and the total RESPIRATORY DISTURBANCE INDEX was 15.6 /hour.  11 events occurred in REM sleep and 108 events in NREM. The REM AHI was 36.7 /hour, versus a non-REM AHI of 14.6. The patient spent 2.5 minutes of total sleep time in the supine position and 370 minutes in non-supine. The supine AHI was 120.0 versus a non-supine AHI of 15.0.  OXYGEN SATURATION & C02:  The Wake baseline 02 saturation was 96%, with the lowest being 81%. Time spent below 89% saturation equaled 6 minutes.  PERIODIC LIMB MOVEMENTS: The patient had a total of 21 Periodic Limb Movements.  The Periodic Limb Movement (PLM) index was 3.4 and the PLM Arousal index was 3.4/hour.  Audio and video analysis did not show any abnormal or unusual movements, behaviors, phonations or vocalizations. The patient took bathroom breaks. Mild snoring was noted. The EKG was in keeping with normal sinus rhythm (NSR).  Post-study, the patient indicated that sleep was worse than usual.   IMPRESSION:  1. Obstructive Sleep Apnea (OSA) 2. Dysfunctions associated with sleep stages  or arousal from sleep  RECOMMENDATIONS:  1. This study demonstrates moderate to severe obstructive sleep apnea, with a total AHI of 15.6/hour, REM AHI of 36.7/hour, and O2 nadir of 81%. Treatment with positive airway pressure in the form of CPAP is recommended. This will require a full night titration study to optimize therapy. Other treatment options - generally speaking - may include avoidance of supine sleep position  along with weight loss, upper airway or jaw surgery in selected patients or the use of an oral appliance in certain patients. ENT evaluation and/or consultation with a maxillofacial surgeon or dentist may be feasible in some instances.    2. Please note that untreated obstructive sleep apnea may carry additional perioperative morbidity. Patients with significant obstructive sleep apnea should receive perioperative PAP therapy and the surgeons and particularly the anesthesiologist should be informed of the diagnosis and the severity of the sleep disordered breathing. 3. This study shows sleep fragmentation and abnormal sleep stage percentages; these are nonspecific findings and per se do not signify an intrinsic sleep disorder or a cause for the patient's sleep-related symptoms. Causes include (but are not limited to) the first night effect of the sleep study, circadian rhythm disturbances, medication effect or an underlying mood disorder or medical problem.  4. The patient should be cautioned not to drive, work at heights, or operate dangerous or heavy equipment when tired or sleepy. Review and reiteration of good sleep hygiene measures should be pursued with any patient. 5. The patient will be seen in follow-up in the sleep clinic at [facility] for discussion of the test results, symptom and treatment compliance review, further management strategies, etc. The referring provider will be notified of the test results.  I certify that I have reviewed the entire raw data recording prior to the issuance of this report in accordance with the Standards of Accreditation of the American Academy of Sleep Medicine (AASM)    Star Age, MD, PhD Diplomat, American Board of Neurology and Sleep Medicine (Neurology and Sleep Medicine)

## 2019-01-23 NOTE — Addendum Note (Signed)
Addended by: Star Age on: 01/23/2019 08:26 AM   Modules accepted: Orders

## 2019-01-23 NOTE — Telephone Encounter (Signed)

## 2019-01-23 NOTE — Progress Notes (Signed)
Patient referred by Dr. Lajuana Ripple, seen by me on 12/21/18, diagnostic PSG on 01/15/19.   Please call and notify the patient that the recent sleep study showed moderate to severe (in REM sleep) obstructive sleep apnea. I recommend treatment for this in the form of CPAP. This will require a repeat sleep study for proper titration and mask fitting and correct monitoring of the oxygen saturations. Please explain to patient. I have placed an order in the chart. Thanks.  Star Age, MD, PhD [facility])

## 2019-02-06 ENCOUNTER — Telehealth: Payer: Self-pay | Admitting: Family Medicine

## 2019-02-06 ENCOUNTER — Other Ambulatory Visit: Payer: Self-pay

## 2019-02-06 NOTE — Telephone Encounter (Signed)
FYI

## 2019-02-07 ENCOUNTER — Ambulatory Visit (INDEPENDENT_AMBULATORY_CARE_PROVIDER_SITE_OTHER): Payer: Medicare Other

## 2019-02-07 DIAGNOSIS — Z23 Encounter for immunization: Secondary | ICD-10-CM

## 2019-02-10 ENCOUNTER — Other Ambulatory Visit (HOSPITAL_COMMUNITY)
Admission: RE | Admit: 2019-02-10 | Discharge: 2019-02-10 | Disposition: A | Discharge status: Home / Self Care | Payer: Medicare Other | Source: Ambulatory Visit | Attending: Neurology | Admitting: Neurology

## 2019-02-10 ENCOUNTER — Other Ambulatory Visit: Payer: Self-pay

## 2019-02-10 DIAGNOSIS — Z01812 Encounter for preprocedural laboratory examination: Secondary | ICD-10-CM | POA: Diagnosis not present

## 2019-02-10 DIAGNOSIS — Z20828 Contact with and (suspected) exposure to other viral communicable diseases: Secondary | ICD-10-CM | POA: Diagnosis not present

## 2019-02-10 LAB — SARS CORONAVIRUS 2 (TAT 6-24 HRS): SARS Coronavirus 2: NEGATIVE

## 2019-02-15 ENCOUNTER — Other Ambulatory Visit: Payer: Self-pay

## 2019-02-15 ENCOUNTER — Ambulatory Visit (INDEPENDENT_AMBULATORY_CARE_PROVIDER_SITE_OTHER): Payer: Medicare Other | Admitting: Neurology

## 2019-02-15 DIAGNOSIS — F17200 Nicotine dependence, unspecified, uncomplicated: Secondary | ICD-10-CM

## 2019-02-15 DIAGNOSIS — G479 Sleep disorder, unspecified: Secondary | ICD-10-CM

## 2019-02-15 DIAGNOSIS — R351 Nocturia: Secondary | ICD-10-CM

## 2019-02-15 DIAGNOSIS — G4733 Obstructive sleep apnea (adult) (pediatric): Secondary | ICD-10-CM | POA: Diagnosis not present

## 2019-02-15 DIAGNOSIS — E669 Obesity, unspecified: Secondary | ICD-10-CM

## 2019-02-15 DIAGNOSIS — G4719 Other hypersomnia: Secondary | ICD-10-CM

## 2019-02-15 DIAGNOSIS — G478 Other sleep disorders: Secondary | ICD-10-CM

## 2019-02-15 DIAGNOSIS — R519 Headache, unspecified: Secondary | ICD-10-CM

## 2019-02-15 DIAGNOSIS — G4720 Circadian rhythm sleep disorder, unspecified type: Secondary | ICD-10-CM

## 2019-02-21 ENCOUNTER — Telehealth: Payer: Self-pay | Admitting: Family Medicine

## 2019-02-21 ENCOUNTER — Ambulatory Visit (INDEPENDENT_AMBULATORY_CARE_PROVIDER_SITE_OTHER): Payer: Medicare Other | Admitting: Family

## 2019-02-21 ENCOUNTER — Encounter: Payer: Self-pay | Admitting: Family

## 2019-02-21 DIAGNOSIS — M546 Pain in thoracic spine: Secondary | ICD-10-CM | POA: Diagnosis not present

## 2019-02-21 MED ORDER — BACLOFEN 10 MG PO TABS: 10.0000 mg | ORAL_TABLET | Freq: Three times a day (TID) | ORAL | 0 refills | Status: DC

## 2019-02-21 MED ORDER — DICLOFENAC SODIUM 75 MG PO TBEC: 75.0000 mg | DELAYED_RELEASE_TABLET | Freq: Two times a day (BID) | ORAL | 0 refills | Status: DC

## 2019-02-21 NOTE — Progress Notes (Signed)
   Virtual Visit via telephone Note Due to COVID-19 pandemic this visit was conducted virtually. This visit type was conducted due to national recommendations for restrictions regarding the COVID-19 Pandemic (e.g. social distancing, sheltering in place) in an effort to limit this patient's exposure and mitigate transmission in our community. All issues noted in this document were discussed and addressed.  A physical exam was not performed with this format.  I connected with Jennifer [name] on 02/21/19 at 2:53 pm by telephone and verified that I am speaking with the correct person using two identifiers. Jennifer [name] is currently located at home and no one is currently with her during visit. The provider, Evelina Dun, FNP is located in their office at time of visit.  I discussed the limitations, risks, security and privacy concerns of performing an evaluation and management service by telephone and the availability of in person appointments. I also discussed with the patient that there may be a patient responsible charge related to this service. The patient expressed understanding and agreed to proceed.   History and Present Illness:  Back Pain This is a new problem. The current episode started in the past 7 days. The problem occurs intermittently. The problem is unchanged. The pain is present in the thoracic spine. The quality of the pain is described as aching. The pain does not radiate. The pain is at a severity of 8/10. The pain is moderate. The symptoms are aggravated by twisting and bending. Pertinent negatives include no bladder incontinence, bowel incontinence, dysuria, fever, numbness, perianal numbness, tingling or weakness. Risk factors include obesity. She has tried heat and bed rest (massage) for the symptoms. The treatment provided mild relief.      Review of Systems  Constitutional: Negative for fever.  Gastrointestinal: Negative for bowel incontinence.  Genitourinary: Negative  for bladder incontinence and dysuria.  Musculoskeletal: Positive for back pain.  Neurological: Negative for tingling, weakness and numbness.  All other systems reviewed and are negative.    Observations/Objective: No SOB or distress noted  Assessment and Plan: 1. Acute right-sided thoracic back pain Rest  Ice  ROM exercises encouraged Sedation precautions discussed No other NSAID's while taking Diclofenac Call if symptoms worsen or do not improve  - diclofenac (VOLTAREN) 75 MG EC tablet; Take 1 tablet (75 mg total) by mouth 2 (two) times daily.  Dispense: 30 tablet; Refill: 0 - baclofen (LIORESAL) 10 MG tablet; Take 1 tablet (10 mg total) by mouth 3 (three) times daily.  Dispense: 30 each; Refill: 0    I discussed the assessment and treatment plan with the patient. The patient was provided an opportunity to ask questions and all were answered. The patient agreed with the plan and demonstrated an understanding of the instructions.   The patient was advised to call back or seek an in-person evaluation if the symptoms worsen or if the condition fails to improve as anticipated.  The above assessment and management plan was discussed with the patient. The patient verbalized understanding of and has agreed to the management plan. Patient is aware to call the clinic if symptoms persist or worsen. Patient is aware when to return to the clinic for a follow-up visit. Patient educated on when it is appropriate to go to the emergency department.   Time call ended:  3:03 pm  I provided 10 minutes of non-face-to-face time during this encounter.    Evelina Dun, FNP

## 2019-02-27 ENCOUNTER — Telehealth: Payer: Self-pay | Admitting: Neurology

## 2019-02-27 NOTE — Addendum Note (Signed)
Addended by: Star Age on: 02/27/2019 08:33 AM   Modules accepted: Orders

## 2019-02-27 NOTE — Procedures (Signed)
PATIENT'S NAME:  Jennifer [name], Jennifer [name] DOB:      1951/01/06      MR#:    [id number]     DATE OF RECORDING: 02/15/2019 REFERRING M.D.:  Danella Maiers. Nadine Counts, DO Study Performed:   CPAP  Titration HISTORY: 68 year old woman with a history of diabetes, hypertension, hyperlipidemia, arthritis with status post right total knee replacement, she is status post left foot tendon surgery last year, smoking, and obesity, who presents for a full night titration study. Her diagnostic PSG on 01/15/19 revealed moderate to severe obstructive sleep apnea, with a total AHI of 15.6/hour, REM AHI of 36.7/hour, and O2 nadir of 81%. BMI of 37.6 kg/m2. The patient's neck circumference measured  inches.  CURRENT MEDICATIONS: Tylenol, Tenormin, Lipitor, Coenzyme Q-10, Advil, Glucophage, Maxzide.   PROCEDURE:  This is a multichannel digital polysomnogram utilizing the SomnoStar 11.2 system.  Electrodes and sensors were applied and monitored per AASM Specifications.   EEG, EOG, Chin and Limb EMG, were sampled at 200 Hz.  ECG, Snore and Nasal Pressure, Thermal Airflow, Respiratory Effort, CPAP Flow and Pressure, Oximetry was sampled at 50 Hz. Digital video and audio were recorded.      The patient was fitted with a small Simplus FFM. CPAP was initiated at 5 cmH20 with heated humidity per AASM split night standards and pressure was advanced to 9/cmH20 because of hypopneas, apneas and desaturations.  At a PAP pressure of 9 cmH20, there was a reduction of the AHI to 0.3/hour with non-supine REM sleep achieved and O2 nadir of 90%.   Lights Out was at 22:01 and Lights On at 04:45. Total recording time (TRT) was 404 minutes, with a total sleep time (TST) of 329.5 minutes. The patient's sleep latency was 55 minutes, which is delayed. REM latency was 101.5 minutes, which is normal. The sleep efficiency was 81.6 %.    SLEEP ARCHITECTURE: WASO (Wake after sleep onset)  was 36.5 minutes with mild to moderate sleep fragmentation noted. There  were 18.5 minutes in Stage N1, 253.5 minutes Stage N2, 17.5 minutes Stage N3 and 40 minutes in Stage REM.  The percentage of Stage N1 was 5.6%, Stage N2 was 76.9%, which is increased, Stage N3 was 5.3% and Stage R (REM sleep) was 12.1%, which is reduced. The arousals were noted as: 127 were spontaneous, 0 were associated with PLMs, 4 were associated with respiratory events.  RESPIRATORY ANALYSIS:  There was a total of 6 respiratory events: 1 obstructive apneas, 0 central apneas and 1 mixed apneas with a total of 2 apneas and an apnea index (AI) of .4 /hour. There were 4 hypopneas with a hypopnea index of .7/hour. The patient also had 0 respiratory event related arousals (RERAs).      The total APNEA/HYPOPNEA INDEX  (AHI) was 1.1 /hour and the total RESPIRATORY DISTURBANCE INDEX was 1.1 /hour  0 events occurred in REM sleep and 6 events in NREM. The REM AHI was 0 /hour versus a non-REM AHI of 1.2 /hour.  The patient spent 0 minutes of total sleep time in the supine position and 330 minutes in non-supine. The supine AHI was 0.0, versus a non-supine AHI of 1.1.  OXYGEN SATURATION & C02:  The baseline 02 saturation was 94%, with the lowest being 90%. Time spent below 89% saturation equaled 0 minutes.  PERIODIC LIMB MOVEMENTS:  The patient had a total of 0 Periodic Limb Movements. The Periodic Limb Movement (PLM) index was 0 and the PLM Arousal index was 0 /hour.  Audio  and video analysis did not show any abnormal or unusual movements, behaviors, phonations or vocalizations. The patient took no bathroom breaks. The EKG was in keeping with normal sinus rhythm.  Post-study, the patient indicated that sleep was the same as usual.   IMPRESSION:   1. Obstructive Sleep Apnea (OSA) 2. Dysfunctions associated with sleep stages or arousal from sleep   RECOMMENDATIONS:   1. This study demonstrates resolution of the patient's obstructive sleep apnea with CPAP therapy. I will, therefore, start the patient on  home CPAP treatment at a pressure of 9 cm via small FFM with heated humidity. The patient should be reminded to be fully compliant with PAP therapy to improve sleep related symptoms and decrease long term cardiovascular risks. The patient should be reminded, that it may take up to 3 months to get fully used to using PAP with all planned sleep. The earlier full compliance is achieved, the better long term compliance tends to be. Please note that untreated obstructive sleep apnea may carry additional perioperative morbidity. Patients with significant obstructive sleep apnea should receive perioperative PAP therapy and the surgeons and particularly the anesthesiologist should be informed of the diagnosis and the severity of the sleep disordered breathing. 2. This study shows sleep fragmentation and abnormal sleep stage percentages; these are nonspecific findings and per se do not signify an intrinsic sleep disorder or a cause for the patient's sleep-related symptoms. Causes include (but are not limited to) the first night effect of the sleep study, circadian rhythm disturbances, medication effect or an underlying mood disorder or medical problem.  3. The patient should be cautioned not to drive, work at heights, or operate dangerous or heavy equipment when tired or sleepy. Review and reiteration of good sleep hygiene measures should be pursued with any patient. 4. The patient will be seen in follow-up in the sleep clinic at [facility] for discussion of the test results, symptom and treatment compliance review, further management strategies, etc. The referring provider will be notified of the test results.   I certify that I have reviewed the entire raw data recording prior to the issuance of this report in accordance with the Standards of Accreditation of the American Academy of Sleep Medicine (AASM)       Star Age, MD, PhD Diplomat, American Board of Neurology and Sleep Medicine (Neurology and Sleep Medicine)

## 2019-02-27 NOTE — Progress Notes (Signed)
Patient referred by Dr. Lajuana Ripple, seen by me on 12/21/18, diagnostic PSG on 01/15/19.  Patient had a CPAP titration study on 02/15/19.  Please call and inform patient that I have entered an order for treatment with positive airway pressure (PAP) treatment for obstructive sleep apnea (OSA). She did well during the latest sleep study with CPAP. We will, therefore, arrange for a machine for home use through a DME (durable medical equipment) company of Her choice; and I will see the patient back in follow-up in about 10 weeks. Please also explain to the patient that I will be looking out for compliance data, which can be downloaded from the machine (stored on an SD card, that is inserted in the machine) or via remote access through a modem, that is built into the machine. At the time of the followup appointment we will discuss sleep study results and how it is going with PAP treatment at home. Please advise patient to bring Her machine at the time of the first FU visit, even though this is cumbersome. Bringing the machine for every visit after that will likely not be needed, but often helps for the first visit to troubleshoot if needed. Please re-enforce the importance of compliance with treatment and the need for [place] to monitor compliance data - often an insurance requirement and actually good feedback for the patient as far as how they are doing.  Also remind patient, that any interim PAP machine or mask issues should be first addressed with the DME company, as they can often help better with technical and mask fit issues. Please ask if patient has a preference regarding DME company.  Please also make sure, the patient has a follow-up appointment with me in about 10 weeks from the setup date, thanks. May see one of our nurse practitioners if needed for proper timing of the FU appointment.  Please fax or rout report to the referring provider. Thanks,   Star Age, MD, PhD [facility])

## 2019-02-27 NOTE — Telephone Encounter (Signed)
-----   Message from Star Age, MD sent at 02/27/2019  8:32 AM EST ----- Patient referred by Dr. Lajuana Ripple, seen by me on 12/21/18, diagnostic PSG on 01/15/19.  Patient had a CPAP titration study on 02/15/19.  Please call and inform patient that I have entered an order for treatment with positive airway pressure (PAP) treatment for obstructive sleep apnea (OSA). She did well during the latest sleep study with CPAP. We will, therefore, arrange for a machine for home use through a DME (durable medical equipment) company of Her choice; and I will see the patient back in follow-up in about 10 weeks. Please also explain to the patient that I will be looking out for compliance data, which can be downloaded from the machine (stored on an SD card, that is inserted in the machine) or via remote access through a modem, that is built into the machine. At the time of the followup appointment we will discuss sleep study results and how it is going with PAP treatment at home. Please advise patient to bring Her machine at the time of the first FU visit, even though this is cumbersome. Bringing the machine for every visit after that will likely not be needed, but often helps for the first visit to troubleshoot if needed. Please re-enforce the importance of compliance with treatment and the need for [place] to monitor compliance data - often an insurance requirement and actually good feedback for the patient as far as how they are doing.  Also remind patient, that any interim PAP machine or mask issues should be first addressed with the DME company, as they can often help better with technical and mask fit issues. Please ask if patient has a preference regarding DME company.  Please also make sure, the patient has a follow-up appointment with me in about 10 weeks from the setup date, thanks. May see one of our nurse practitioners if needed for proper timing of the FU appointment.  Please fax or rout report to the referring provider.  Thanks,   Star Age, MD, PhD [facility])

## 2019-02-27 NOTE — Telephone Encounter (Signed)
I called pt. I advised pt that Dr. Rexene Alberts reviewed their sleep study results and found that pt was best treated with CPAP at a pressure of 9. Dr. Rexene Alberts recommends that pt start CPAP. I reviewed PAP compliance expectations with the pt. Pt is agreeable to starting a CPAP. I advised pt that an order will be sent to a DME, [facility], and [employer] will call the pt within about one week after they file with the pt's insurance. [employer] will show the pt how to use the machine, fit for masks, and troubleshoot the CPAP if needed. A follow up appt was made for insurance purposes with Ward Givens, NP on Jan 14,2021 at 11:30 am. Pt verbalized understanding to arrive 15 minutes early and bring their CPAP. A letter with all of this information in it will be mailed to the pt as a reminder. I verified with the pt that the address we have on file is correct. Pt verbalized understanding of results. Pt had no questions at this time but was encouraged to call back if questions arise. I have sent the order to [employer] and have received confirmation that they have received the order.

## 2019-03-03 DIAGNOSIS — G4733 Obstructive sleep apnea (adult) (pediatric): Secondary | ICD-10-CM | POA: Diagnosis not present

## 2019-03-24 ENCOUNTER — Ambulatory Visit: Payer: Medicare Other

## 2019-04-02 DIAGNOSIS — G4733 Obstructive sleep apnea (adult) (pediatric): Secondary | ICD-10-CM | POA: Diagnosis not present

## 2019-04-03 ENCOUNTER — Other Ambulatory Visit: Payer: Self-pay

## 2019-04-04 ENCOUNTER — Encounter: Payer: Self-pay | Admitting: Family Medicine

## 2019-04-04 ENCOUNTER — Ambulatory Visit (INDEPENDENT_AMBULATORY_CARE_PROVIDER_SITE_OTHER): Payer: Medicare Other | Admitting: Family Medicine

## 2019-04-04 VITALS — BP 109/74 | HR 74 | Temp 97.3°F | Ht 66.0 in | Wt 224.0 lb

## 2019-04-04 DIAGNOSIS — Z9989 Dependence on other enabling machines and devices: Secondary | ICD-10-CM | POA: Diagnosis not present

## 2019-04-04 DIAGNOSIS — E1169 Type 2 diabetes mellitus with other specified complication: Secondary | ICD-10-CM | POA: Diagnosis not present

## 2019-04-04 DIAGNOSIS — G4733 Obstructive sleep apnea (adult) (pediatric): Secondary | ICD-10-CM

## 2019-04-04 DIAGNOSIS — I152 Hypertension secondary to endocrine disorders: Secondary | ICD-10-CM

## 2019-04-04 DIAGNOSIS — E1151 Type 2 diabetes mellitus with diabetic peripheral angiopathy without gangrene: Secondary | ICD-10-CM

## 2019-04-04 DIAGNOSIS — E1159 Type 2 diabetes mellitus with other circulatory complications: Secondary | ICD-10-CM

## 2019-04-04 DIAGNOSIS — I10 Essential (primary) hypertension: Secondary | ICD-10-CM

## 2019-04-04 DIAGNOSIS — E785 Hyperlipidemia, unspecified: Secondary | ICD-10-CM

## 2019-04-04 LAB — BAYER DCA HB A1C WAIVED: HB A1C (BAYER DCA - WAIVED): 7 % — ABNORMAL HIGH (ref <7.0)

## 2019-04-04 NOTE — Patient Instructions (Signed)
A1c was 7.0 today.  I am not going to make any changes to your Metformin but I want you to watch your blood sugar and watch the amount of carbohydrate and sugar that you are taking in.  If you go above 7 by her next visit we may need to consider increasing your dose versus adding other medication.  Make sure that you schedule your mammogram

## 2019-04-04 NOTE — Progress Notes (Addendum)
Subjective: CC: follow up DM2 PCP: Janora Norlander, DO ION:GEXBM W Jennifer is a 68 y.o. female presenting to clinic today for:  1. Type 2 Diabetes w/ HTN and HLD Patient was last seen 4 months ago for diabetes.  She reports compliance with metformin 500 mg p.o. daily, Lipitor 40 mg nightly, Maxide 37.5-25 and atenolol 25 mg daily.   Last eye exam: UTD Last foot exam: UTD Last A1c:  Lab Results  Component Value Date   HGBA1C 6.6 12/06/2018  Nephropathy screen indicated?:  needs Last flu, zoster and/or pneumovax: PNA  ROS: Denies CP, SOB, foot ulcerations, numbness or tingling in extremities or chest pain.  2.  OSA on CPAP Patient recently diagnosed with obstructive sleep apnea.  She was placed on a CPAP with pressure of 9.  Patient thought that she was supposed to follow-up with her PCP on this and was not aware of her May 04, 2019 follow-up visit with [facility].  She reports improvement in energy, snoring.  Currently using a half face mask with humidification.  Sleep is good.  Allergies  Allergen Reactions  . Hydrocodone Itching  . Oxycodone Nausea And Vomiting    Pt stated, "It makes me not want to eat"   Past Medical History:  Diagnosis Date  . Arthritis   . Dyspnea   . Hyperlipidemia   . Hypertension     Current Outpatient Medications:  .  acetaminophen (TYLENOL) 500 MG tablet, Take 1,000 mg by mouth 2 (two) times daily as needed for mild pain or moderate pain. , Disp: , Rfl:  .  atenolol (TENORMIN) 25 MG tablet, Take 1 tablet (25 mg total) by mouth daily., Disp: 90 tablet, Rfl: 4 .  atorvastatin (LIPITOR) 40 MG tablet, Take 1 tablet (40 mg total) by mouth at bedtime., Disp: 90 tablet, Rfl: 1 .  baclofen (LIORESAL) 10 MG tablet, Take 1 tablet (10 mg total) by mouth 3 (three) times daily., Disp: 30 each, Rfl: 0 .  Coenzyme Q-10 100 MG capsule, Take 2 capsules (200 mg total) by mouth daily., Disp: 60 capsule, Rfl: 6 .  diclofenac  (VOLTAREN) 75 MG EC tablet, Take 1 tablet (75 mg total) by mouth 2 (two) times daily., Disp: 30 tablet, Rfl: 0 .  ibuprofen (ADVIL) 800 MG tablet, Take 1 tablet (800 mg total) by mouth 3 (three) times daily. Pt needs an appt prior to future refills., Disp: 90 tablet, Rfl: 1 .  metFORMIN (GLUCOPHAGE) 500 MG tablet, Take 1 tablet (500 mg total) by mouth daily with breakfast., Disp: 90 tablet, Rfl: 1 .  triamterene-hydrochlorothiazide (MAXZIDE-25) 37.5-25 MG tablet, Take 1 tablet by mouth once daily, Disp: 90 tablet, Rfl: 1 Social History   Socioeconomic History  . Marital status: Married    Spouse name: Not on file  . Number of children: 3  . Years of education: Not on file  . Highest education level: Not on file  Occupational History  . Not on file  Tobacco Use  . Smoking status: Current Every Day Smoker    Packs/day: 0.50    Years: 20.00    Pack years: 10.00    Types: Cigarettes  . Smokeless tobacco: Never Used  Substance and Sexual Activity  . Alcohol use: No  . Drug use: No  . Sexual activity: Yes  Other Topics Concern  . Not on file  Social History Narrative  . Not on file   Social Determinants of Health   Financial Resource Strain:   .  Difficulty of Paying Living Expenses: Not on file  Food Insecurity:   . Worried About Programme researcher, broadcasting/film/video in the Last Year: Not on file  . Ran Out of Food in the Last Year: Not on file  Transportation Needs:   . Lack of Transportation (Medical): Not on file  . Lack of Transportation (Non-Medical): Not on file  Physical Activity:   . Days of Exercise per Week: Not on file  . Minutes of Exercise per Session: Not on file  Stress:   . Feeling of Stress : Not on file  Social Connections:   . Frequency of Communication with Friends and Family: Not on file  . Frequency of Social Gatherings with Friends and Family: Not on file  . Attends Religious Services: Not on file  . Active Member of Clubs or Organizations: Not on file  . Attends Occupational hygienist Meetings: Not on file  . Marital Status: Not on file  Intimate Partner Violence:   . Fear of Current or Ex-Partner: Not on file  . Emotionally Abused: Not on file  . Physically Abused: Not on file  . Sexually Abused: Not on file   Family History  Problem Relation Age of Onset  . Diabetes Mother   . Stroke Mother   . Alcohol abuse Father   . Stroke Father   . Colon cancer Neg Hx     Objective: Office vital signs reviewed. BP 109/74   Pulse 74   Temp (!) 97.3 F (36.3 C) (Temporal)   Ht 5\' 6"  (1.676 m)   Wt 224 lb (101.6 kg)   SpO2 97%   BMI 36.15 kg/m   Physical Examination:  General: Awake, alert, obese. well appearing female. No acute distress Cardio: regular rate and rhythm, S1S2 heard, no murmurs appreciated Pulm: clear to auscultation bilaterally, no wheezes, rhonchi or rales; normal work of breathing on room air Extremities: Warm, well-perfused.  No edema, cyanosis or clubbing.   Assessment/ Plan: 68 y.o. female   1. OSA on CPAP Doing well with CPAP machine.  She will continue to get her supplies through [facility].  I will CC her sleep physician.  2. 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  Continue Metformin 500 mg once daily.  Reinforced carb modification and increase physical exercise.  If persistently 7 or above at next visit we will increase Metformin to twice daily.  Patient aware of plan and will follow up in 3 months - hgba1c  3. Hyperlipidemia associated with type 2 diabetes mellitus (HCC) Continue statin  4. Hypertension associated with diabetes (HCC) Borderline low but patient denied any lightheadedness.  We discussed monitoring blood pressures closely and if she drops below systolics of 100 or diastolics of 60, low threshold to discontinue versus reduce the blood pressure medicine.     Orders Placed This Encounter  Procedures  . hgba1c   No orders of the defined types  were placed in this encounter.    73, DO [facility] [phone]

## 2019-04-06 ENCOUNTER — Ambulatory Visit (INDEPENDENT_AMBULATORY_CARE_PROVIDER_SITE_OTHER): Payer: Medicare Other | Admitting: *Deleted

## 2019-04-06 DIAGNOSIS — Z0000 Encounter for general adult medical examination without abnormal findings: Secondary | ICD-10-CM | POA: Diagnosis not present

## 2019-04-06 NOTE — Progress Notes (Signed)
MEDICARE ANNUAL WELLNESS VISIT  04/06/2019  Telephone Visit Disclaimer This Medicare AWV was conducted by telephone due to national recommendations for restrictions regarding the COVID-19 Pandemic (e.g. social distancing).  I verified, using two identifiers, that I am speaking with Jennifer RoachHazel W Miracle or their authorized healthcare agent. I discussed the limitations, risks, security, and privacy concerns of performing an evaluation and management service by telephone and the potential availability of an in-person appointment in the future. The patient expressed understanding and agreed to proceed.   Subjective:  Jennifer [name] is a 68 y.o. female patient of Raliegh IpGottschalk, [name], DO who had a Medicare Annual Wellness Visit today via telephone. Jennifer [name] is Retired and lives with their spouse. she has 3 children. she reports that she is socially active and does interact with friends/family regularly. she is not physically active and enjoys tv and cooking.  Patient Care Team: Raliegh IpGottschalk, [name], DO as PCP - General (Family Medicine)  Advanced Directives 04/06/2019 11/25/2016 02/12/2014 02/02/2014  Does Patient Have a Medical Advance Directive? No No No No  Would patient like information on creating a medical advance directive? Yes (MAU/Ambulatory/Procedural Areas - Information given) No - Patient declined No - patient declined information No - patient declined information    Hospital Utilization Over the Past 12 Months: # of hospitalizations or ER visits: 0 # of surgeries: 0  Review of Systems    Patient reports that her overall health is worse compared to last year.  History obtained from chart review and the patient  Patient Reported Readings (BP, Pulse, CBG, Weight, etc) CBG 100  Pain Assessment Pain : 0-10 Pain Score: 6  Pain Type: Chronic pain Pain Location: Knee Pain Orientation: Left Pain Descriptors / Indicators: Nagging Pain Onset: More than a month ago Pain Frequency:  Constant Pain Relieving Factors: none Effect of Pain on Daily Activities: some  Pain Relieving Factors: none  Current Medications & Allergies (verified) Allergies as of 04/06/2019      Reactions   Hydrocodone Itching   Oxycodone Nausea And Vomiting   Pt stated, "It makes me not want to eat"      Medication List       Accurate as of April 06, 2019  9:09 AM. If you have any questions, ask your nurse or doctor.        STOP taking these medications   baclofen 10 MG tablet Commonly known as: LIORESAL   diclofenac 75 MG EC tablet Commonly known as: VOLTAREN     TAKE these medications   acetaminophen 500 MG tablet Commonly known as: TYLENOL Take 1,000 mg by mouth 2 (two) times daily as needed for mild pain or moderate pain.   atenolol 25 MG tablet Commonly known as: TENORMIN Take 1 tablet (25 mg total) by mouth daily.   atorvastatin 40 MG tablet Commonly known as: LIPITOR Take 1 tablet (40 mg total) by mouth at bedtime.   Coenzyme Q-10 100 MG capsule Take 2 capsules (200 mg total) by mouth daily.   ibuprofen 800 MG tablet Commonly known as: ADVIL Take 1 tablet (800 mg total) by mouth 3 (three) times daily. Pt needs an appt prior to future refills.   metFORMIN 500 MG tablet Commonly known as: GLUCOPHAGE Take 1 tablet (500 mg total) by mouth daily with breakfast.   triamterene-hydrochlorothiazide 37.5-25 MG tablet Commonly known as: MAXZIDE-25 Take 1 tablet by mouth once daily       History (reviewed): Past Medical History:  Diagnosis Date  .  Arthritis   . Dyspnea   . Hyperlipidemia   . Hypertension    Past Surgical History:  Procedure Laterality Date  . ABDOMINAL HYSTERECTOMY    . COLONOSCOPY N/A 11/25/2016   Procedure: COLONOSCOPY;  Surgeon: Malissa Hippo, MD;  Location: [facility];  Service: Endoscopy;  Laterality: N/A;  830  . KNEE ARTHROSCOPY     BIL   2010   . LUNG BIOPSY     RIGHT 30 YRS AGO   . 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 Diabetes Mother   . Stroke Mother   . Alcohol abuse Father   . Stroke Father   . Colon cancer Neg Hx    Social History   Socioeconomic History  . Marital status: Married    Spouse name: Not on file  . Number of children: 3  . Years of education: 20  . Highest education level: Not on file  Occupational History  . Occupation: CNa    Employer: [employer]  Tobacco Use  . Smoking status: Current Every Day Smoker    Packs/day: 0.50    Years: 20.00    Pack years: 10.00    Types: Cigarettes  . Smokeless tobacco: Never Used  Substance and Sexual Activity  . Alcohol use: No  . Drug use: No  . Sexual activity: Yes  Other Topics Concern  . Not on file  Social History Narrative  . Not on file   Social Determinants of Health   Financial Resource Strain:   . Difficulty of Paying Living Expenses: Not on file  Food Insecurity:   . Worried About Programme researcher, broadcasting/film/video in the Last Year: Not on file  . Ran Out of Food in the Last Year: Not on file  Transportation Needs:   . Lack of Transportation (Medical): Not on file  . Lack of Transportation (Non-Medical): Not on file  Physical Activity:   . Days of Exercise per Week: Not on file  . Minutes of Exercise per Session: Not on file  Stress:   . Feeling of Stress : Not on file  Social Connections:   . Frequency of Communication with Friends and Family: Not on file  . Frequency of Social Gatherings with Friends and Family: Not on file  . Attends Religious Services: Not on file  . Active Member of Clubs or Organizations: Not on file  . Attends Banker Meetings: Not on file  . Marital Status: Not on file    Activities of Daily Living In your present state of health, do you have any difficulty performing the following activities: 04/06/2019    Walking or climbing stairs? Y  Dressing or bathing? N  Doing errands, shopping? N  Preparing Food and eating ? N  Using the Toilet? N  In the past six months, have you accidently leaked urine? N  Do you have problems with loss of bowel control? N  Managing your Finances? N  Housekeeping or managing your Housekeeping? N  Some recent data might be hidden    Patient Education/ Literacy How often do you need to have someone help you when you read instructions, pamphlets, or other written materials from your doctor or pharmacy?: 1 - Never What is the last grade level you completed in school?: 12  Exercise Exercise limited  by: orthopedic condition(s)  Diet Patient reports consuming 2 meals a day and 2 snack(s) a day Patient reports that her primary diet is: Diabetic Patient reports that she does have regular access to food.   Depression Screen PHQ 2/9 Scores 04/06/2019 04/04/2019 12/06/2018 04/28/2018 12/31/2017 08/11/2017 12/24/2016  PHQ - 2 Score 0 0 0 0 0 0 0  PHQ- 9 Score - 0 0 0 - - -     Fall Risk Fall Risk  04/06/2019 04/04/2019 12/06/2018 04/28/2018 12/31/2017  Falls in the past year? 0 0 0 0 No     Objective:  Jennifer [name] seemed alert and oriented and she participated appropriately during our telephone visit.  Blood Pressure Weight BMI  BP Readings from Last 3 Encounters:  04/04/19 109/74  12/21/18 130/85  12/06/18 99/66   Wt Readings from Last 3 Encounters:  04/04/19 224 lb (101.6 kg)  12/21/18 233 lb (105.7 kg)  12/06/18 232 lb (105.2 kg)   BMI Readings from Last 1 Encounters:  04/04/19 36.15 kg/m    *Unable to obtain current vital signs, weight, and BMI due to telephone visit type  Hearing/Vision  . Jennifer [name] did not seem to have difficulty with hearing/understanding during the telephone conversation . Reports that she has not had a formal eye exam by an eye care professional within the past year . Reports that she has not had a formal hearing evaluation within  the past year *Unable to fully assess hearing and vision during telephone visit type  Cognitive Function: 6CIT Screen 04/06/2019  What Year? 0 points  What month? 0 points  What time? 0 points  Count back from 20 0 points  Months in reverse 0 points  Repeat phrase 0 points  Total Score 0   (Normal:0-7, Significant for Dysfunction: >8)  Normal Cognitive Function Screening: Yes   Immunization & Health Maintenance Record Immunization History  Administered Date(s) Administered  . Fluad Quad(high Dose 65+) 02/07/2019  . Influenza-Unspecified 01/13/2018  . Pneumococcal Conjugate-13 12/06/2018    Health Maintenance  Topic Date Due  . TETANUS/TDAP  03/31/1972  . MAMMOGRAM  04/01/2003  . DEXA SCAN  03/31/2018  . URINE MICROALBUMIN  04/29/2019  . FOOT EXAM  04/29/2019  . HEMOGLOBIN A1C  10/03/2019  . OPHTHALMOLOGY EXAM  10/10/2019  . PNA vac Low Risk Adult (2 of 2 - PPSV23) 12/06/2019  . COLONOSCOPY  11/26/2026  . INFLUENZA VACCINE  Completed  . Hepatitis C Screening  Completed       Assessment  This is a routine wellness examination for Jennifer [name].  Health Maintenance: Due or Overdue Health Maintenance Due  Topic Date Due  . TETANUS/TDAP  03/31/1972  . MAMMOGRAM  04/01/2003  . DEXA SCAN  03/31/2018  . URINE MICROALBUMIN  04/29/2019    Jennifer [name] does not need a referral for Community Assistance: Care Management:   no Social Work:    no Prescription Assistance:  no Nutrition/Diabetes Education:  no   Plan:  Personalized Goals Goals Addressed            This Visit's Progress   . Prevent falls       Due to knee pain patient is very cautious and goal is to prevent any falls      Personalized Health Maintenance & Screening Recommendations    Lung Cancer Screening Recommended: no (Low Dose CT Chest recommended if Age 100-80 years, 30 pack-year currently smoking OR have quit w/in past 15 years) Hepatitis C Screening recommended:  no HIV Screening  recommended: no  Advanced Directives: Written information placed up front for pt to pick up and review.  Referrals & Orders No orders of the defined types were placed in this encounter.   Follow-up Plan . Follow-up with Raliegh Ip, DO as planned on 06/09/19.Pt will receive TDAP at this appt. . Mammogram scheduled for 04/26/19. Marland Kitchen Pt needs Dexa Scan and was placed on list.    I have personally reviewed and noted the following in the patient's chart:   . Medical and social history . Use of alcohol, tobacco or illicit drugs  . Current medications and supplements . Functional ability and status . Nutritional status . Physical activity . Advanced directives . List of other physicians . Hospitalizations, surgeries, and ER visits in previous 12 months . Vitals . Screenings to include cognitive, depression, and falls . Referrals and appointments  In addition, I have reviewed and discussed with Jennifer [name] certain preventive protocols, quality metrics, and best practice recommendations. A written personalized care plan for preventive services as well as general preventive health recommendations is available and can be mailed to the patient at her request.      Conard Novak, LPN  [id number]

## 2019-04-17 ENCOUNTER — Telehealth: Payer: Self-pay | Admitting: Neurology

## 2019-04-17 NOTE — Telephone Encounter (Signed)
Called the patient and there was no answer. LVM informing the patient that the supplies for the machine comes from the DME company. Informed that insurance allots so much that is covered. LVM advising of [employer]'s phone number and advising the patient to contact them.

## 2019-04-17 NOTE — Telephone Encounter (Signed)
Pt called wanting to know how does she go about getting some more filters for her machine. Please advise.

## 2019-04-27 ENCOUNTER — Ambulatory Visit: Payer: Medicare Other | Attending: Internal Medicine

## 2019-04-27 ENCOUNTER — Other Ambulatory Visit: Payer: Self-pay

## 2019-04-27 DIAGNOSIS — Z20822 Contact with and (suspected) exposure to covid-19: Secondary | ICD-10-CM

## 2019-04-28 ENCOUNTER — Telehealth: Payer: Self-pay

## 2019-04-28 NOTE — Telephone Encounter (Signed)
Patient called and  She was informed that her COVID-19 test result was still pending.  She verbalized understanding and will call back.

## 2019-04-29 LAB — NOVEL CORONAVIRUS, NAA: SARS-CoV-2, NAA: NOT DETECTED

## 2019-05-03 DIAGNOSIS — G4733 Obstructive sleep apnea (adult) (pediatric): Secondary | ICD-10-CM | POA: Diagnosis not present

## 2019-05-04 ENCOUNTER — Encounter: Payer: Self-pay | Admitting: Adult Health

## 2019-05-04 ENCOUNTER — Ambulatory Visit: Payer: Medicare Other | Admitting: Adult Health

## 2019-05-17 DIAGNOSIS — Z1231 Encounter for screening mammogram for malignant neoplasm of breast: Secondary | ICD-10-CM | POA: Diagnosis not present

## 2019-05-30 ENCOUNTER — Telehealth: Payer: Self-pay | Admitting: Family Medicine

## 2019-05-30 ENCOUNTER — Ambulatory Visit (INDEPENDENT_AMBULATORY_CARE_PROVIDER_SITE_OTHER): Payer: Medicare Other | Admitting: Family Medicine

## 2019-05-30 ENCOUNTER — Encounter: Payer: Self-pay | Admitting: Family Medicine

## 2019-05-30 DIAGNOSIS — M1990 Unspecified osteoarthritis, unspecified site: Secondary | ICD-10-CM

## 2019-05-30 MED ORDER — IBUPROFEN 800 MG PO TABS: 800.0000 mg | ORAL_TABLET | Freq: Three times a day (TID) | ORAL | 1 refills | Status: DC

## 2019-05-30 NOTE — Telephone Encounter (Signed)
Patient aware that she needs to be seen televisit given for acute provider today for joint pain.

## 2019-05-30 NOTE — Progress Notes (Signed)
Virtual Visit via Telephone Note  I connected with Jennifer [name] on 05/30/19 at 3:03 PM by telephone and verified that I am speaking with the correct person using two identifiers. Jennifer [name] is currently located at home and nobody is currently with her during this visit. The provider, Gwenlyn Fudge, FNP is located in their office at time of visit.  I discussed the limitations, risks, security and privacy concerns of performing an evaluation and management service by telephone and the availability of in person appointments. I also discussed with the patient that there may be a patient responsible charge related to this service. The patient expressed understanding and agreed to proceed.  Subjective: PCP: Raliegh Ip, DO  Chief Complaint  Patient presents with  . Pain   Patient reports she is just dealing with her joints hurting due to her known history of arthritis.  She has a prescription for ibuprofen 800 mg that she got from her foot doctor and was wondering if we could give her a refill of this as it works well for her pain.   ROS: Per HPI  Current Outpatient Medications:  .  acetaminophen (TYLENOL) 500 MG tablet, Take 1,000 mg by mouth 2 (two) times daily as needed for mild pain or moderate pain. , Disp: , Rfl:  .  atenolol (TENORMIN) 25 MG tablet, Take 1 tablet (25 mg total) by mouth daily., Disp: 90 tablet, Rfl: 4 .  atorvastatin (LIPITOR) 40 MG tablet, Take 1 tablet (40 mg total) by mouth at bedtime., Disp: 90 tablet, Rfl: 1 .  Coenzyme Q-10 100 MG capsule, Take 2 capsules (200 mg total) by mouth daily., Disp: 60 capsule, Rfl: 6 .  ibuprofen (ADVIL) 800 MG tablet, Take 1 tablet (800 mg total) by mouth 3 (three) times daily. Pt needs an appt prior to future refills., Disp: 90 tablet, Rfl: 1 .  metFORMIN (GLUCOPHAGE) 500 MG tablet, Take 1 tablet (500 mg total) by mouth daily with breakfast., Disp: 90 tablet, Rfl: 1 .  triamterene-hydrochlorothiazide (MAXZIDE-25)  37.5-25 MG tablet, Take 1 tablet by mouth once daily, Disp: 90 tablet, Rfl: 1  Allergies  Allergen Reactions  . Hydrocodone Itching  . Oxycodone Nausea And Vomiting    Pt stated, "It makes me not want to eat"   Past Medical History:  Diagnosis Date  . Arthritis   . Dyspnea   . Hyperlipidemia   . Hypertension     Observations/Objective: A&O  No respiratory distress or wheezing audible over the phone Mood, judgement, and thought processes all WNL  Assessment and Plan: 1. Arthritis - Well controlled on current regimen.  I did advise if she was having times where her pain was not well controlled she could try taking ibuprofen with Tylenol 500 mg. - ibuprofen (ADVIL) 800 MG tablet; Take 1 tablet (800 mg total) by mouth 3 (three) times daily. Pt needs an appt prior to future refills.  Dispense: 90 tablet; Refill: 1   Follow Up Instructions:  I discussed the assessment and treatment plan with the patient. The patient was provided an opportunity to ask questions and all were answered. The patient agreed with the plan and demonstrated an understanding of the instructions.   The patient was advised to call back or seek an in-person evaluation if the symptoms worsen or if the condition fails to improve as anticipated.  The above assessment and management plan was discussed with the patient. The patient verbalized understanding of and has agreed to the management  plan. Patient is aware to call the clinic if symptoms persist or worsen. Patient is aware when to return to the clinic for a follow-up visit. Patient educated on when it is appropriate to go to the emergency department.   Time call ended: 3:07 PM  I provided 6 minutes of non-face-to-face time during this encounter.  Hendricks Limes, MSN, APRN, FNP-C [facility] 05/30/19

## 2019-05-30 NOTE — Telephone Encounter (Signed)
Patient needs appointment to be seen.

## 2019-06-03 DIAGNOSIS — G4733 Obstructive sleep apnea (adult) (pediatric): Secondary | ICD-10-CM | POA: Diagnosis not present

## 2019-06-06 DIAGNOSIS — G4733 Obstructive sleep apnea (adult) (pediatric): Secondary | ICD-10-CM | POA: Diagnosis not present

## 2019-06-09 ENCOUNTER — Ambulatory Visit: Payer: Medicare Other | Admitting: Family Medicine

## 2019-06-09 ENCOUNTER — Other Ambulatory Visit: Payer: Medicare Other

## 2019-06-14 ENCOUNTER — Other Ambulatory Visit: Payer: Medicare Other

## 2019-06-23 ENCOUNTER — Telehealth: Payer: Self-pay | Admitting: Family Medicine

## 2019-06-23 NOTE — Chronic Care Management (AMB) (Signed)
Chronic Care Management   Note  06/23/2019 Name: Jennifer [name] MRN: [id number] DOB: [birth year]  Jennifer [name] is a 69 y.o. year old female who is a primary care patient of Janora Norlander, DO. I reached out to [employer] by phone today in response to a referral sent by Ms. Waynard Reeds [place]'s health plan.     Ms. Shuey was given information about Chronic Care Management services today including:  1. CCM service includes personalized support from designated clinical staff supervised by her physician, including individualized plan of care and coordination with other care providers 2. 24/7 contact phone numbers for assistance for urgent and routine care needs. 3. Service will only be billed when office clinical staff spend 20 minutes or more in a month to coordinate care. 4. Only one practitioner may furnish and bill the service in a calendar month. 5. The patient may stop CCM services at any time (effective at the end of the month) by phone call to the office staff. 6. The patient will be responsible for cost sharing (co-pay) of up to 20% of the service fee (after annual deductible is met).  Patient agreed to services and verbal consent obtained.   Follow up plan: Telephone appointment with care management team member scheduled for: 08/16/19  Noreene Larsson, [place], [place], [place] [address] Direct Dial: [phone] [email or web address] Website: [email or web address]

## 2019-06-23 NOTE — Chronic Care Management (AMB) (Signed)
  Chronic Care Management   Outreach Note  06/23/2019 Name: [name] MRN: [id number] DOB: [birth year]  Jennifer [name] is a 69 y.o. year old female who is a primary care patient of Raliegh Ip, DO. I reached out to [employer] by phone today in response to a referral sent by Ms. Sherryll Burger [place]'s health plan.     An unsuccessful telephone outreach was attempted today. The patient was referred to the case management team for assistance with care management and care coordination.   Follow Up Plan: A HIPPA compliant phone message was left for the patient providing contact information and requesting a return call.  The care management team will reach out to the patient again over the next 7 days.  If patient returns call to provider office, please advise to call Embedded Care Management Care Guide Penne Lash at [phone].  Penne Lash, RMA Care Guide, Embedded Care Coordination [facility]  [place], [place] [address] Direct Dial: [phone] [email or web address] Website: [email or web address]

## 2019-06-24 ENCOUNTER — Other Ambulatory Visit: Payer: Self-pay | Admitting: Family Medicine

## 2019-07-01 DIAGNOSIS — G4733 Obstructive sleep apnea (adult) (pediatric): Secondary | ICD-10-CM | POA: Diagnosis not present

## 2019-07-11 ENCOUNTER — Encounter: Payer: Self-pay | Admitting: Adult Health

## 2019-07-13 ENCOUNTER — Encounter: Payer: Self-pay | Admitting: Adult Health

## 2019-07-13 ENCOUNTER — Ambulatory Visit: Payer: Medicare Other | Admitting: Adult Health

## 2019-07-13 VITALS — BP 117/75 | HR 69 | Temp 97.1°F | Ht 65.5 in | Wt 227.0 lb

## 2019-07-13 DIAGNOSIS — Z9989 Dependence on other enabling machines and devices: Secondary | ICD-10-CM | POA: Diagnosis not present

## 2019-07-13 DIAGNOSIS — G4733 Obstructive sleep apnea (adult) (pediatric): Secondary | ICD-10-CM

## 2019-07-13 NOTE — Patient Instructions (Signed)
Continue using CPAP nightly and greater than 4 hours each night °If your symptoms worsen or you develop new symptoms please let us know.  ° °

## 2019-07-13 NOTE — Progress Notes (Addendum)
PATIENT: Jennifer [name] DOB: February 16, 1951  REASON FOR VISIT: follow up HISTORY FROM: patient  HISTORY OF PRESENT ILLNESS: Today 07/13/19:  Jennifer [name] is a 69 year old female with a history of obstructive sleep apnea on CPAP.  Her download indicates that she use her machine nightly for compliance of 100%.  She used her machine greater than 4 hours each night.  On average she uses her machine 7 hours and 10 minutes.  Her residual AHI is 2.6 on 9 cm of water with EPR 3.  Leak in the 95th percentile is 0.7 L/min.  Reports that initially she could see a benefit however recently she has been having to take naps.  She returns today for an evaluation  HISTORY Jennifer [name] is a 69 year old right-handed woman with an underlying medical history of diabetes, hypertension, hyperlipidemia, arthritis with status post right total knee replacement, she is status post left foot tendon surgery last year, smoking, and obesity, who reports loud snoring and excessive daytime somnolence.  I reviewed your office note from 12/06/2018. Her Epworth sleepiness score is 9 out of 24, fatigue severity score is 50 out of 63.  She is married and lives with her husband.  She is retired.  They have 3 children.  She does smoke about half a pack per day and does not utilize alcohol, drinks caffeine in the form of coffee, 2 to 3 cups in the mornings.  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  Their youngest son is 101 and lives with them, he requires peritoneal dialysis.  She has a daughter.  Her oldest son is a Naval architect and was diagnosed with sleep apnea as she recalls but is not sure if he has a CPAP machine.  Her weight has been fairly stable, some fluctuation.  She has some discomfort in her knees at night, right knee did fairly well after her knee replacement.  She wonders if she needs a left knee replacement but is reluctant to consider this.  She does not have any telltale symptoms of restless leg syndrome  and has not to twitch her feet or legs at night.  She has nocturia about once or twice per average night and has woken up more so recently with the occasional morning headache.  She has no personal history of migraine headaches.  Bedtime is generally around 11 and rise time 8.  She would be willing to consider CPAP therapy.   REVIEW OF SYSTEMS: Out of a complete 14 system review of symptoms, the patient complains only of the following symptoms, and all other reviewed systems are negative.  FSS 42 ESS 11  ALLERGIES: Allergies  Allergen Reactions  . Hydrocodone Itching  . Oxycodone Nausea And Vomiting    Pt stated, "It makes me not want to eat"    HOME MEDICATIONS: Outpatient Medications Prior to Visit  Medication Sig Dispense Refill  . acetaminophen (TYLENOL) 500 MG tablet Take 1,000 mg by mouth 2 (two) times daily as needed for mild pain or moderate pain.     Marland Kitchen atenolol (TENORMIN) 25 MG tablet Take 1 tablet (25 mg total) by mouth daily. 90 tablet 4  . atorvastatin (LIPITOR) 40 MG tablet TAKE 1 TABLET BY MOUTH AT BEDTIME 90 tablet 0  . Coenzyme Q-10 100 MG capsule Take 2 capsules (200 mg total) by mouth daily. 60 capsule 6  . ibuprofen (ADVIL) 800 MG tablet Take 1 tablet (800 mg total) by mouth 3 (three) times  daily. Pt needs an appt prior to future refills. 90 tablet 1  . metFORMIN (GLUCOPHAGE) 500 MG tablet Take 1 tablet by mouth once daily with breakfast 90 tablet 0  . triamterene-hydrochlorothiazide (MAXZIDE-25) 37.5-25 MG tablet Take 1 tablet by mouth once daily 90 tablet 1   No facility-administered medications prior to visit.    PAST MEDICAL HISTORY: Past Medical History:  Diagnosis Date  . Arthritis   . Dyspnea   . Hyperlipidemia   . Hypertension     PAST SURGICAL HISTORY: Past Surgical History:  Procedure Laterality Date  . ABDOMINAL HYSTERECTOMY    . COLONOSCOPY N/A 11/25/2016   Procedure: COLONOSCOPY;  Surgeon: Malissa Hippo, MD;  Location: [facility];   Service: Endoscopy;  Laterality: N/A;  830  . KNEE ARTHROSCOPY     BIL   2010   . LUNG BIOPSY     RIGHT 30 YRS AGO   . TOTAL KNEE ARTHROPLASTY Right 02/12/2014   Procedure: TOTAL KNEE ARTHROPLASTY;  Surgeon: Dannielle Huh, MD;  Location: [facility] OR;  Service: Orthopedics;  Laterality: Right;    FAMILY HISTORY: Family History  Problem Relation Age of Onset  . Diabetes Mother   . Stroke Mother   . Alcohol abuse Father   . Stroke Father   . Colon cancer Neg Hx     SOCIAL HISTORY: Social History   Socioeconomic History  . Marital status: Married    Spouse name: Not on file  . Number of children: 3  . Years of education: 80  . Highest education level: Not on file  Occupational History  . Occupation: CNa    Employer: [employer]  Tobacco Use  . Smoking status: Current Every Day Smoker    Packs/day: 0.50    Years: 20.00    Pack years: 10.00    Types: Cigarettes  . Smokeless tobacco: Never Used  Substance and Sexual Activity  . Alcohol use: No  . Drug use: No  . Sexual activity: Yes  Other Topics Concern  . Not on file  Social History Narrative  . Not on file   Social Determinants of Health   Financial Resource Strain: Low Risk   . Difficulty of Paying Living Expenses: Not very hard  Food Insecurity: No Food Insecurity  . Worried About Programme researcher, broadcasting/film/video in the Last Year: Never true  . Ran Out of Food in the Last Year: Never true  Transportation Needs: No Transportation Needs  . Lack of Transportation (Medical): No  . Lack of Transportation (Non-Medical): No  Physical Activity: Inactive  . Days of Exercise per Week: 0 days  . Minutes of Exercise per Session: 0 min  Stress: No Stress Concern Present  . Feeling of Stress : Only a little  Social Connections: Slightly Isolated  . Frequency of Communication with Friends and Family: Three times a week  . Frequency of Social Gatherings with Friends and Family: Once a week  . Attends Religious Services: 1 to 4 times per  year  . Active Member of Clubs or Organizations: No  . Attends Banker Meetings: Never  . Marital Status: Married  Catering manager Violence: Not At Risk  . Fear of Current or Ex-Partner: No  . Emotionally Abused: No  . Physically Abused: No  . Sexually Abused: No      PHYSICAL EXAM  Vitals:   07/13/19 0908  Temp: (!) 97.1 F (36.2 C)  Weight: 227 lb (103 kg)  Height: 5' 5.5" (1.664 m)  Body mass index is 37.2 kg/m.  Generalized: Well developed, in no acute distress  Chest: Lungs clear to auscultation bilaterally  Neurological examination  Mentation: Alert oriented to time, place, history taking. Follows all commands speech and language fluent Cranial nerve II-XII: Extraocular movements were full, visual field were full on confrontational test Head turning and shoulder shrug  were normal and symmetric. Motor: The motor testing reveals 5 over 5 strength of all 4 extremities. Good symmetric motor tone is noted throughout.  Sensory: Sensory testing is intact to soft touch on all 4 extremities. No evidence of extinction is noted.  Gait and station: Gait is normal.    DIAGNOSTIC DATA (LABS, IMAGING, TESTING) - I reviewed patient records, labs, notes, testing and imaging myself where available.  Lab Results  Component Value Date   WBC 6.7 08/16/2017   HGB 13.8 08/16/2017   HCT 40.8 08/16/2017   MCV 84 08/16/2017   PLT 262 08/16/2017      Component Value Date/Time   NA 140 08/16/2017 0834   K 4.1 08/16/2017 0834   CL 102 08/16/2017 0834   CO2 23 08/16/2017 0834   GLUCOSE 114 (H) 08/16/2017 0834   GLUCOSE 123 (H) 02/13/2014 0553   BUN 18 08/16/2017 0834   CREATININE 0.75 08/16/2017 0834   CALCIUM 9.9 08/16/2017 0834   PROT 7.1 01/24/2018 1229   ALBUMIN 4.3 01/24/2018 1229   AST 15 01/24/2018 1229   ALT 14 01/24/2018 1229   ALKPHOS 116 01/24/2018 1229   BILITOT 0.4 01/24/2018 1229   GFRNONAA 85 08/16/2017 0834   GFRAA 97 08/16/2017 0834   Lab  Results  Component Value Date   CHOL 151 01/24/2018   HDL 38 (L) 01/24/2018   LDLCALC 71 01/24/2018   TRIG 208 (H) 01/24/2018   CHOLHDL 4.0 01/24/2018   Lab Results  Component Value Date   HGBA1C 7.0 (H) 04/04/2019      ASSESSMENT AND PLAN 69 y.o. year old female  has a past medical history of Arthritis, Dyspnea, Hyperlipidemia, and Hypertension. here with:  1. OSA on CPAP  - CPAP compliance excellent - Good treatment of AHI  - Encourage patient to use CPAP nightly and > 4 hours each night - Advised patient that she can use the CPAP for naps as well - F/U in 1 year or sooner if needed   I spent 20 minutes of face-to-face and non-face-to-face time with patient.  This included previsit chart review, lab review, study review, order entry, electronic health record documentation, patient education.  Ward Givens, MSN, NP-C 07/13/2019, 9:11 AM [facility] [address], [place], [place] [address] [phone]  I reviewed the above note and documentation by the Nurse Practitioner and agree with the history, exam, assessment and plan as outlined above. I was available for consultation. Star Age, MD, PhD [facility])

## 2019-07-20 ENCOUNTER — Encounter: Payer: Self-pay | Admitting: Family Medicine

## 2019-07-20 ENCOUNTER — Other Ambulatory Visit: Payer: Self-pay

## 2019-07-20 ENCOUNTER — Other Ambulatory Visit: Payer: Medicare Other

## 2019-07-20 ENCOUNTER — Ambulatory Visit (INDEPENDENT_AMBULATORY_CARE_PROVIDER_SITE_OTHER): Payer: Medicare Other | Admitting: Family Medicine

## 2019-07-20 VITALS — BP 106/66 | HR 74 | Temp 98.3°F | Ht 65.5 in | Wt 230.0 lb

## 2019-07-20 DIAGNOSIS — E1151 Type 2 diabetes mellitus with diabetic peripheral angiopathy without gangrene: Secondary | ICD-10-CM

## 2019-07-20 DIAGNOSIS — E785 Hyperlipidemia, unspecified: Secondary | ICD-10-CM

## 2019-07-20 DIAGNOSIS — Z780 Asymptomatic menopausal state: Secondary | ICD-10-CM | POA: Diagnosis not present

## 2019-07-20 DIAGNOSIS — E1169 Type 2 diabetes mellitus with other specified complication: Secondary | ICD-10-CM | POA: Diagnosis not present

## 2019-07-20 DIAGNOSIS — I10 Essential (primary) hypertension: Secondary | ICD-10-CM

## 2019-07-20 DIAGNOSIS — E1159 Type 2 diabetes mellitus with other circulatory complications: Secondary | ICD-10-CM

## 2019-07-20 DIAGNOSIS — I152 Hypertension secondary to endocrine disorders: Secondary | ICD-10-CM

## 2019-07-20 DIAGNOSIS — R200 Anesthesia of skin: Secondary | ICD-10-CM | POA: Diagnosis not present

## 2019-07-20 LAB — BAYER DCA HB A1C WAIVED: HB A1C (BAYER DCA - WAIVED): 6.8 % (ref <7.0)

## 2019-07-20 MED ORDER — METFORMIN HCL 500 MG PO TABS: ORAL_TABLET | ORAL | 3 refills | Status: DC

## 2019-07-20 MED ORDER — TRIAMTERENE-HCTZ 37.5-25 MG PO TABS: 1.0000 | ORAL_TABLET | Freq: Every day | ORAL | 3 refills | Status: DC

## 2019-07-20 MED ORDER — ATORVASTATIN CALCIUM 40 MG PO TABS: 40.0000 mg | ORAL_TABLET | Freq: Every day | ORAL | 3 refills | Status: DC

## 2019-07-20 MED ORDER — ATENOLOL 25 MG PO TABS: 25.0000 mg | ORAL_TABLET | Freq: Every day | ORAL | 4 refills | Status: DC

## 2019-07-20 NOTE — Progress Notes (Signed)
Subjective: CC: follow up DM2 PCP: Janora Norlander, DO GHW:EXHBZ W Fleming is a 69 y.o. female presenting to clinic today for:  1. Type 2 Diabetes w/ HTN and HLD Patient was last seen 4 months ago for diabetes.  She reports compliance with metformin 500 mg p.o. daily, Lipitor 40 mg nightly, Maxide 37.5-25 and atenolol 25 mg daily.  Blood sugars have been well controlled at home.  She needs refills on all of her medicines.  Last eye exam: UTD Last foot exam: needs Last A1c:  Lab Results  Component Value Date   HGBA1C 7.0 (H) 04/04/2019  Nephropathy screen indicated?:  needs Last flu, zoster and/or pneumovax: PNA  ROS: Denies CP, SOB, foot ulcerations, numbness or tingling in extremities or chest pain.  2.  Numbness and tingling bilateral hands Patient reports chronic numbness and tingling bilateral hands.  Symptoms are fairly constant.  Denies any discoloration but does feel that they swell sometimes.  No lower extremity symptoms.  Allergies  Allergen Reactions  . Hydrocodone Itching  . Oxycodone Nausea And Vomiting    Pt stated, "It makes me not want to eat"   Past Medical History:  Diagnosis Date  . Arthritis   . Dyspnea   . Hyperlipidemia   . Hypertension     Current Outpatient Medications:  .  acetaminophen (TYLENOL) 500 MG tablet, Take 1,000 mg by mouth 2 (two) times daily as needed for mild pain or moderate pain. , Disp: , Rfl:  .  atenolol (TENORMIN) 25 MG tablet, Take 1 tablet (25 mg total) by mouth daily., Disp: 90 tablet, Rfl: 4 .  atorvastatin (LIPITOR) 40 MG tablet, TAKE 1 TABLET BY MOUTH AT BEDTIME, Disp: 90 tablet, Rfl: 0 .  Coenzyme Q-10 100 MG capsule, Take 2 capsules (200 mg total) by mouth daily., Disp: 60 capsule, Rfl: 6 .  ibuprofen (ADVIL) 800 MG tablet, Take 1 tablet (800 mg total) by mouth 3 (three) times daily. Pt needs an appt prior to future refills., Disp: 90 tablet, Rfl: 1 .  metFORMIN (GLUCOPHAGE) 500 MG tablet, Take 1 tablet by mouth once  daily with breakfast, Disp: 90 tablet, Rfl: 0 .  triamterene-hydrochlorothiazide (MAXZIDE-25) 37.5-25 MG tablet, Take 1 tablet by mouth once daily, Disp: 90 tablet, Rfl: 1 Social History   Socioeconomic History  . Marital status: Married    Spouse name: Not on file  . Number of children: 3  . Years of education: 23  . Highest education level: Not on file  Occupational History  . Occupation: CNa    Employer: [employer]  Tobacco Use  . Smoking status: Current Every Day Smoker    Packs/day: 0.50    Years: 35.00    Pack years: 17.50    Types: Cigarettes  . Smokeless tobacco: Never Used  Substance and Sexual Activity  . Alcohol use: No  . Drug use: No  . Sexual activity: Yes  Other Topics Concern  . Not on file  Social History Narrative  . Not on file   Social Determinants of Health   Financial Resource Strain: Low Risk   . Difficulty of Paying Living Expenses: Not very hard  Food Insecurity: No Food Insecurity  . Worried About Charity fundraiser in the Last Year: Never true  . Ran Out of Food in the Last Year: Never true  Transportation Needs: No Transportation Needs  . Lack of Transportation (Medical): No  . Lack of Transportation (Non-Medical): No  Physical Activity: Inactive  .  Days of Exercise per Week: 0 days  . Minutes of Exercise per Session: 0 min  Stress: No Stress Concern Present  . Feeling of Stress : Only a little  Social Connections: Slightly Isolated  . Frequency of Communication with Friends and Family: Three times a week  . Frequency of Social Gatherings with Friends and Family: Once a week  . Attends Religious Services: 1 to 4 times per year  . Active Member of Clubs or Organizations: No  . Attends Archivist Meetings: Never  . Marital Status: Married  Human resources officer Violence: Not At Risk  . Fear of Current or Ex-Partner: No  . Emotionally Abused: No  . Physically Abused: No  . Sexually Abused: No   Family History  Problem Relation  Age of Onset  . Diabetes Mother   . Stroke Mother   . Alcohol abuse Father   . Stroke Father   . Colon cancer Neg Hx     Objective: Office vital signs reviewed. BP 106/66   Pulse 74   Temp 98.3 F (36.8 C) (Temporal)   Ht 5' 5.5" (1.664 m)   Wt 230 lb (104.3 kg)   SpO2 92%   BMI 37.69 kg/m   Physical Examination:  General: Awake, alert, obese. well appearing female. No acute distress Cardio: regular rate and rhythm, S1S2 heard, no murmurs appreciated Pulm: clear to auscultation bilaterally, no wheezes, rhonchi or rales; normal work of breathing on room air Extremities: Warm, well-perfused.  No edema, cyanosis or clubbing.  MSK:   Wrist: No joint swelling, palpable bony abnormalities.  Symptoms are not exacerbated by Tinel's, Phalen's or reverse Phalen's. Neuro: see DM foot Diabetic Foot Exam - Simple   Simple Foot Form Diabetic Foot exam was performed with the following findings: Yes 07/20/2019  4:32 PM  Visual Inspection See comments: Yes Sensation Testing Intact to touch and monofilament testing bilaterally: Yes Pulse Check Posterior Tibialis and Dorsalis pulse intact bilaterally: Yes Comments Slightly decreased vibratory sensation bilaterally.  She has thickened, onychomycotic changes to the nails bilaterally.  No ulcerations or preulcerative calluses noted    Assessment/ Plan: 69 y.o. female   1. Type 2 diabetes mellitus with peripheral circulatory disorder (HCC) Well-controlled.  Continue current regimen - Bayer DCA Hb A1c Waived - CMP14+EGFR  2. Hyperlipidemia associated with type 2 diabetes mellitus (HCC) Continue statin - CMP14+EGFR  3. Hypertension associated with diabetes ([place]) Controlled - CMP14+EGFR  4. Bilateral hand numbness I suspect that this may be due to underlying carpal tunnel but will look for metabolic etiology. - Magnesium - Vitamin B12 - Ferritin - CMP14+EGFR  5. Asymptomatic postmenopausal estrogen deficiency Patient will come in  for DEXA scan. - DG [facility] DEXA; Future     Orders Placed This Encounter  Procedures  . DG [facility] DEXA    Standing Status:   Future    Standing Expiration Date:   09/18/2020    Order Specific Question:   Reason for Exam (SYMPTOM  OR DIAGNOSIS REQUIRED)    Answer:   screen osteoporosis  . Bayer DCA Hb A1c Waived  . Magnesium  . Vitamin B12  . Ferritin  . CMP14+EGFR   Meds ordered this encounter  Medications  . atenolol (TENORMIN) 25 MG tablet    Sig: Take 1 tablet (25 mg total) by mouth daily.    Dispense:  90 tablet    Refill:  4  . atorvastatin (LIPITOR) 40 MG tablet    Sig: Take 1 tablet (40 mg total)  by mouth at bedtime.    Dispense:  90 tablet    Refill:  3  . metFORMIN (GLUCOPHAGE) 500 MG tablet    Sig: Take 1 tablet by mouth once daily with breakfast    Dispense:  90 tablet    Refill:  3  . triamterene-hydrochlorothiazide (MAXZIDE-25) 37.5-25 MG tablet    Sig: Take 1 tablet by mouth daily.    Dispense:  90 tablet    Refill:  [place], [place] [phone]

## 2019-07-20 NOTE — Patient Instructions (Addendum)
You had labs performed today.  You will be contacted with the results of the labs once they are available, usually in the next 3 business days for routine lab work.  If you have an active my chart account, they will be released to your MyChart.  If you prefer to have these labs released to you via telephone, please let us know.  If you had a pap smear or biopsy performed, expect to be contacted in about 7-10 days.  If you have persistent numbness, we may consider having you see the orthopedist for further eval/ possibly nerve conduction testing.    Sugar looks great.  Continue current regimen.   Bone Density Test The bone density test uses a special type of X-ray to measure the amount of calcium and other minerals in your bones. It can measure bone density in the hip and the spine. The test procedure is similar to having a regular X-ray. This test may also be called:  Bone densitometry.  Bone mineral density test.  Dual-energy X-ray absorptiometry (DEXA). You may have this test to:  Diagnose a condition that causes weak or thin bones (osteoporosis).  Screen you for osteoporosis.  Predict your risk for a broken bone (fracture).  Determine how well your osteoporosis treatment is working. Tell a health care provider about:  Any allergies you have.  All medicines you are taking, including vitamins, herbs, eye drops, creams, and over-the-counter medicines.  Any problems you or family members have had with anesthetic medicines.  Any blood disorders you have.  Any surgeries you have had.  Any medical conditions you have.  Whether you are pregnant or may be pregnant.  Any medical tests you have had within the past 14 days that used contrast material. What are the risks? Generally, this is a safe procedure. However, it does expose you to a small amount of radiation, which can slightly increase your cancer risk. What happens before the procedure?  Do not take any calcium  supplements starting 24 hours before your test.  Remove all metal jewelry, eyeglasses, dental appliances, and any other metal objects. What happens during the procedure?   You will lie down on an exam table. There will be an X-ray generator below you and an imaging device above you.  Other devices, such as boxes or braces, may be used to position your body properly for the scan.  The machine will slowly scan your body. You will need to keep still.  The images will show up on a screen in the room. Images will be examined by a specialist after your test is done. The procedure may vary among health care providers and hospitals. What happens after the procedure?  It is up to you to get your test results. Ask your health care provider, or the department that is doing the test, when your results will be ready. Summary  A bone density test is an imaging test that uses a type of X-ray to measure the amount of calcium and other minerals in your bones.  The test may be used to diagnose or screen you for a condition that causes weak or thin bones (osteoporosis), predict your risk for a broken bone (fracture), or determine how well your osteoporosis treatment is working.  Do not take any calcium supplements starting 24 hours before your test.  Ask your health care provider, or the department that is doing the test, when your results will be ready. This information is not intended to replace advice given  to you by your health care provider. Make sure you discuss any questions you have with your health care provider. Document Revised: 04/22/2017 Document Reviewed: 02/08/2017 Elsevier Patient Education  [place].

## 2019-07-21 LAB — CMP14+EGFR
ALT: 12 IU/L (ref 0–32)
AST: 15 IU/L (ref 0–40)
Albumin/Globulin Ratio: 1.3 (ref 1.2–2.2)
Albumin: 4.1 g/dL (ref 3.8–4.8)
Alkaline Phosphatase: 115 IU/L (ref 39–117)
BUN/Creatinine Ratio: 21 (ref 12–28)
BUN: 19 mg/dL (ref 8–27)
Bilirubin Total: <0.2 mg/dL (ref 0.0–1.2)
CO2: 24 mmol/L (ref 20–29)
Calcium: 9.2 mg/dL (ref 8.7–10.3)
Chloride: 97 mmol/L (ref 96–106)
Creatinine, Ser: 0.89 mg/dL (ref 0.57–1.00)
GFR calc Af Amer: 78 mL/min/{1.73_m2} (ref >59)
GFR calc non Af Amer: 68 mL/min/{1.73_m2} (ref >59)
Globulin, Total: 3.1 g/dL (ref 1.5–4.5)
Glucose: 94 mg/dL (ref 65–99)
Potassium: 3.8 mmol/L (ref 3.5–5.2)
Sodium: 135 mmol/L (ref 134–144)
Total Protein: 7.2 g/dL (ref 6.0–8.5)

## 2019-07-21 LAB — FERRITIN: Ferritin: 359 ng/mL — ABNORMAL HIGH (ref 15–150)

## 2019-07-21 LAB — MAGNESIUM: Magnesium: 2.7 mg/dL — ABNORMAL HIGH (ref 1.6–2.3)

## 2019-07-21 LAB — VITAMIN B12: Vitamin B-12: 616 pg/mL (ref 232–1245)

## 2019-08-01 DIAGNOSIS — G4733 Obstructive sleep apnea (adult) (pediatric): Secondary | ICD-10-CM | POA: Diagnosis not present

## 2019-08-16 ENCOUNTER — Ambulatory Visit: Payer: Medicare Other | Admitting: *Deleted

## 2019-08-16 DIAGNOSIS — E1169 Type 2 diabetes mellitus with other specified complication: Secondary | ICD-10-CM

## 2019-08-16 DIAGNOSIS — E1159 Type 2 diabetes mellitus with other circulatory complications: Secondary | ICD-10-CM

## 2019-08-16 DIAGNOSIS — I152 Hypertension secondary to endocrine disorders: Secondary | ICD-10-CM

## 2019-08-16 DIAGNOSIS — E1151 Type 2 diabetes mellitus with diabetic peripheral angiopathy without gangrene: Secondary | ICD-10-CM

## 2019-08-16 NOTE — Chronic Care Management (AMB) (Signed)
  Chronic Care Management   Initial Visit Outreach Note  08/16/2019 Name: LAYAL JAVID MRN: [id number] DOB: [birth year]  Referred by: Raliegh Ip, DO Reason for referral : Chronic Care Management (Initial Visit)   An unsuccessful Initial Telephone Visit was attempted today. The patient was referred to the case management team for assistance with care management and care coordination.    RN Care Plan   . Chronic Disease Management Needs       CARE PLAN ENTRY (see longtitudinal plan of care for additional care plan information)  Current Barriers:  . Chronic Disease Management support, education, and care coordination needs related to HTN, DM, PAD, OSA, HLD, OA  Clinical Goals: . Over the next 10 days, patient will be contacted by a Care Guide to reschedule their Initial CCM Visit . Over the next 30 days, patient will have an Initial CCM Visit with a member of the embedded CCM team to discuss self-management of their chronic medical conditions  Interventions: . Chart reviewed in preparation for initial visit telephone call . Collaboration with other care team members as needed . Unsuccessful outreach to patient  . A HIPPA compliant phone message was left for the patient providing contact information and requesting a return call.  . Request sent to care guides to reach out and reschedule patient's initial visit  Patient Self Care Activities: . Undetermined   Initial goal documentation         Follow Up Plan: A HIPPA compliant phone message was left for the patient providing contact information and requesting a return call.  The care management team will reach out to the patient again over the next 10 days.   Demetrios Loll, BSN, RN-BC Embedded Chronic Care Manager [facility] Care Management Direct Dial: [phone]

## 2019-08-16 NOTE — Patient Instructions (Signed)
Visit Information  Goals Addressed            This Visit's Progress   . Chronic Disease Management Needs       CARE PLAN ENTRY (see longtitudinal plan of care for additional care plan information)  Current Barriers:  . Chronic Disease Management support, education, and care coordination needs related to HTN, DM, PAD, OSA, HLD, OA  Clinical Goals: . Over the next 10 days, patient will be contacted by a Care Guide to reschedule their Initial CCM Visit . Over the next 30 days, patient will have an Initial CCM Visit with a member of the embedded CCM team to discuss self-management of their chronic medical conditions  Interventions: . Chart reviewed in preparation for initial visit telephone call . Collaboration with other care team members as needed . Unsuccessful outreach to patient  . A HIPPA compliant phone message was left for the patient providing contact information and requesting a return call.  . Request sent to care guides to reach out and reschedule patient's initial visit  Patient Self Care Activities: . Undetermined   Initial goal documentation         Follow-up Plan The care management team will reach out to the patient again over the next 10 days.   Demetrios Loll, BSN, RN-BC Embedded Chronic Care Manager [facility] Care Management Direct Dial: [phone]

## 2019-08-17 ENCOUNTER — Telehealth: Payer: Self-pay | Admitting: Family Medicine

## 2019-08-17 NOTE — Chronic Care Management (AMB) (Signed)
  Care Management   Note  08/17/2019 Name: [name] MRN: [id number] DOB: [birth year]  Jennifer [name] is a 69 y.o. year old female who is a primary care patient of Raliegh Ip, DO and is actively engaged with the care management team. I reached out to Leighton Roach by phone today to assist with re-scheduling an initial visit with the RN Case Manager or Licensed Clinical Social Worker for intake appointment.   Follow up plan: Unsuccessful telephone outreach attempt made. A HIPPA compliant phone message was left for the patient providing contact information and requesting a return call. The care management team will reach out to the patient again over the next 7 days. If patient returns call to provider office, please advise to call Embedded Care Management Care Guide Gwenevere Ghazi at [phone].  Gwenevere Ghazi  Care Guide, Embedded Care Coordination [facility]  [place], [place] [address] Direct Dial: [phone] [email or web address]: [email or web address]

## 2019-08-18 NOTE — Chronic Care Management (AMB) (Signed)
  Care Management   Note  08/18/2019 Name: WYNNE ROZAK MRN: [id number] DOB: 12-11-1950  AALAIYAH YASSIN is a 69 y.o. year old female who is a primary care patient of Raliegh Ip, DO and is actively engaged with the care management team. I reached out to Leighton Roach by phone today to assist with scheduling an initial visit with the Licensed Clinical Social Worker for intake appointment.  Follow up plan: Telephone appointment with care management team member scheduled for: 10/03/2019   [facility] Guide, Embedded Care Coordination [facility]  [place], [place] [address] Direct Dial: [phone] [email or web address] Website: [email or web address]

## 2019-08-31 DIAGNOSIS — G4733 Obstructive sleep apnea (adult) (pediatric): Secondary | ICD-10-CM | POA: Diagnosis not present

## 2019-09-14 ENCOUNTER — Other Ambulatory Visit: Payer: Self-pay | Admitting: Family Medicine

## 2019-09-14 DIAGNOSIS — M1990 Unspecified osteoarthritis, unspecified site: Secondary | ICD-10-CM

## 2019-10-01 DIAGNOSIS — G4733 Obstructive sleep apnea (adult) (pediatric): Secondary | ICD-10-CM | POA: Diagnosis not present

## 2019-10-03 ENCOUNTER — Ambulatory Visit (INDEPENDENT_AMBULATORY_CARE_PROVIDER_SITE_OTHER): Payer: Medicare Other | Admitting: Licensed Clinical Social Worker

## 2019-10-03 DIAGNOSIS — Z9989 Dependence on other enabling machines and devices: Secondary | ICD-10-CM

## 2019-10-03 DIAGNOSIS — E1151 Type 2 diabetes mellitus with diabetic peripheral angiopathy without gangrene: Secondary | ICD-10-CM

## 2019-10-03 DIAGNOSIS — I152 Hypertension secondary to endocrine disorders: Secondary | ICD-10-CM

## 2019-10-03 DIAGNOSIS — Z96651 Presence of right artificial knee joint: Secondary | ICD-10-CM

## 2019-10-03 DIAGNOSIS — E1159 Type 2 diabetes mellitus with other circulatory complications: Secondary | ICD-10-CM

## 2019-10-03 DIAGNOSIS — E1169 Type 2 diabetes mellitus with other specified complication: Secondary | ICD-10-CM

## 2019-10-03 DIAGNOSIS — E785 Hyperlipidemia, unspecified: Secondary | ICD-10-CM

## 2019-10-03 DIAGNOSIS — M1811 Unilateral primary osteoarthritis of first carpometacarpal joint, right hand: Secondary | ICD-10-CM

## 2019-10-03 DIAGNOSIS — I10 Essential (primary) hypertension: Secondary | ICD-10-CM

## 2019-10-03 DIAGNOSIS — I739 Peripheral vascular disease, unspecified: Secondary | ICD-10-CM

## 2019-10-03 NOTE — Patient Instructions (Addendum)
Licensed Clinical Social Worker Visit Information  Goals we discussed today:  Goals Addressed              This Visit's Progress   .  Client will talkwith me in next 30 days about mobility issues and client completion of dailily activities (pt-stated)        CARE PLAN ENTRY   Current Barriers:  . 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 Talked with clinet about CCM program services . Talked with client about RNCM support with CCM program . Talked with client about pain issues faced . Talked with client about sleeping issues of client (client said she uses CPAP to help her sleep at night) . Talked with client about ambulation needs of client  . Talked with client about her upcoming client medical appointments . Talked with client about relaxation techniques (likes to listen to music, watch old movies, does puzzles, enjoys talking via phone with sister) . Talked with client about ADLs completion . Talked with client about meal preparation for client . Talked with client about transport needs of client  Patient Self Care Activities:   Drives self as needed to medical appointments Completes ADLs independently Prepares meals daily   Patient Self Care Deficits:  Marland Kitchen Mobility issues . Pain issues  Initial goal documentation        Materials Provided: No  Follow Up Plan: LCSW to call client in next 4 weeks to talk with her about mobility issues of client and about client completion of ADLs  The patient verbalized understanding of instructions provided today and declined a print copy of patient instruction materials.   [name].Jenetta Wease MSW, LCSW Licensed Clinical Social Worker [facility] Care Management [phone]

## 2019-10-03 NOTE — Chronic Care Management (AMB) (Signed)
Chronic Care Management    Clinical Social Work Follow Up Note  10/03/2019 Name: Jennifer [name] MRN: [id number] DOB: 08/14/1950  Jennifer [name] is a 69 y.o. year old female who is a primary care patient of Janora Norlander, DO. The CCM team was consulted for assistance with [employer] .   Review of patient status, including review of consultants reports, other relevant assessments, and collaboration with appropriate care team members and the patient's provider was performed as part of comprehensive patient evaluation and provision of chronic care management services.    SDOH (Social Determinants of Health) assessments performed: Yes;risk for tobacco use;risk for physical inactivity;   SDOH Interventions     Most Recent Value  SDOH Interventions  Depression Interventions/Treatment  --  [talked with client about LCSW support and RNCM support]        Chronic Care Management from 10/03/2019 in [place]  PHQ-9 Total Score 3     GAD 7 : Generalized Anxiety Score 10/03/2019  Nervous, Anxious, on Edge 0  Control/stop worrying 0  Worry too much - different things 0  Trouble relaxing 0  Restless 0  Easily annoyed or irritable 0  Afraid - awful might happen 0  Total GAD 7 Score 0  Anxiety Difficulty Somewhat difficult    Outpatient Encounter Medications as of 10/03/2019  Medication Sig  . acetaminophen (TYLENOL) 500 MG tablet Take 1,000 mg by mouth 2 (two) times daily as needed for mild pain or moderate pain.   Marland Kitchen atenolol (TENORMIN) 25 MG tablet Take 1 tablet (25 mg total) by mouth daily.  Marland Kitchen atorvastatin (LIPITOR) 40 MG tablet Take 1 tablet (40 mg total) by mouth at bedtime.  . Coenzyme Q-10 100 MG capsule Take 2 capsules (200 mg total) by mouth daily.  Marland Kitchen ibuprofen (ADVIL) 800 MG tablet TAKE 1 TABLET BY MOUTH THREE TIMES DAILY  . metFORMIN (GLUCOPHAGE) 500 MG tablet Take 1 tablet by mouth once daily with breakfast  . triamterene-hydrochlorothiazide  (MAXZIDE-25) 37.5-25 MG tablet Take 1 tablet by mouth daily.   No facility-administered encounter medications on file as of 10/03/2019.    Goals Addressed              This Visit's Progress   .  Client will talk with me in next 30 days about mobility issues and client completion of daily activities (pt-stated)        CARE PLAN ENTRY   Current Barriers:  . 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 Talked with clinet about CCM program services . Talked with client about RNCM support with CCM program . Talked with client about pain issues faced . Talked with client about sleeping issues of client (client said she uses CPAP to help her sleep at night) . Talked with client about ambulation needs of client  . Talked with client about her upcoming client medical appointments . Talked with client about relaxation techniques (likes to listen to music, watch old movies, does puzzles, enjoys talking via phone with sister) . Talked with client about ADLs completion . Talked with client about meal preparation for client . Talked with client about transport needs of client  Patient Self Care Activities:   Drives self as needed to medical appointments  Completes ADLs independently Prepares meals daily   Patient Self Care Deficits:  Marland Kitchen Mobility issues . Pain issues  Initial goal documentation       Follow Up Plan: LCSW to call client in next 4 weeks to talk with her about mobility issues of client and about client completion of ADLs  [name].Essance Gatti MSW, LCSW Licensed Clinical Social Worker [facility] Care Management [phone]

## 2019-10-30 ENCOUNTER — Encounter: Payer: Self-pay | Admitting: General Practice

## 2019-10-31 DIAGNOSIS — G4733 Obstructive sleep apnea (adult) (pediatric): Secondary | ICD-10-CM | POA: Diagnosis not present

## 2019-11-02 ENCOUNTER — Other Ambulatory Visit: Payer: Self-pay | Admitting: Family Medicine

## 2019-11-02 DIAGNOSIS — M1990 Unspecified osteoarthritis, unspecified site: Secondary | ICD-10-CM

## 2019-11-07 ENCOUNTER — Ambulatory Visit: Payer: Medicare Other | Admitting: Licensed Clinical Social Worker

## 2019-11-07 DIAGNOSIS — Z9989 Dependence on other enabling machines and devices: Secondary | ICD-10-CM

## 2019-11-07 DIAGNOSIS — I739 Peripheral vascular disease, unspecified: Secondary | ICD-10-CM

## 2019-11-07 DIAGNOSIS — E785 Hyperlipidemia, unspecified: Secondary | ICD-10-CM

## 2019-11-07 DIAGNOSIS — I152 Hypertension secondary to endocrine disorders: Secondary | ICD-10-CM

## 2019-11-07 DIAGNOSIS — M1811 Unilateral primary osteoarthritis of first carpometacarpal joint, right hand: Secondary | ICD-10-CM

## 2019-11-07 DIAGNOSIS — E1159 Type 2 diabetes mellitus with other circulatory complications: Secondary | ICD-10-CM

## 2019-11-07 DIAGNOSIS — E1169 Type 2 diabetes mellitus with other specified complication: Secondary | ICD-10-CM

## 2019-11-07 DIAGNOSIS — G4733 Obstructive sleep apnea (adult) (pediatric): Secondary | ICD-10-CM

## 2019-11-07 DIAGNOSIS — E1151 Type 2 diabetes mellitus with diabetic peripheral angiopathy without gangrene: Secondary | ICD-10-CM

## 2019-11-07 DIAGNOSIS — Z96651 Presence of right artificial knee joint: Secondary | ICD-10-CM

## 2019-11-07 NOTE — Chronic Care Management (AMB) (Signed)
  Chronic Care Management    Clinical Social Work Follow Up Note  11/07/2019 Name: Jennifer [name] MRN: [id number] DOB: 04/29/1950  Jennifer [name] is a 69 y.o. year old female who is a primary care patient of Raliegh Ip, DO. The CCM team was consulted for assistance with [employer] .   Review of patient status, including review of consultants reports, other relevant assessments, and collaboration with appropriate care team members and the patient's provider was performed as part of comprehensive patient evaluation and provision of chronic care management services.    SDOH (Social Determinants of Health) assessments performed: No;risk for tobacco use; risk for depression; risk for physical inactivity    Chronic Care Management from 10/03/2019 in [facility]  PHQ-9 Total Score 3      GAD 7 : Generalized Anxiety Score 10/03/2019  Nervous, Anxious, on Edge 0  Control/stop worrying 0  Worry too much - different things 0  Trouble relaxing 0  Restless 0  Easily annoyed or irritable 0  Afraid - awful might happen 0  Total GAD 7 Score 0  Anxiety Difficulty Somewhat difficult    Outpatient Encounter Medications as of 11/07/2019  Medication Sig  . acetaminophen (TYLENOL) 500 MG tablet Take 1,000 mg by mouth 2 (two) times daily as needed for mild pain or moderate pain.   Marland Kitchen atenolol (TENORMIN) 25 MG tablet Take 1 tablet (25 mg total) by mouth daily.  Marland Kitchen atorvastatin (LIPITOR) 40 MG tablet Take 1 tablet (40 mg total) by mouth at bedtime.  . Coenzyme Q-10 100 MG capsule Take 2 capsules (200 mg total) by mouth daily.  Marland Kitchen ibuprofen (ADVIL) 800 MG tablet TAKE 1 TABLET BY MOUTH THREE TIMES DAILY  . metFORMIN (GLUCOPHAGE) 500 MG tablet Take 1 tablet by mouth once daily with breakfast  . triamterene-hydrochlorothiazide (MAXZIDE-25) 37.5-25 MG tablet Take 1 tablet by mouth daily.   No facility-administered encounter medications on file as of 11/07/2019.    LCSW  called client phone numbers several times today but LCSW was not able to speak via phone with client today; however, LCSW did leave phone message for [name] asking her to please return call to LCSW at [phone] to discuss social work needs of client  Follow Up Plan: LCSW to call client in next 4 weeks to talk with her about mobility issues of client and about client completion of ADLs  [name].Maxyne Derocher MSW, LCSW Licensed Clinical Social Worker [facility] Care Management [phone]

## 2019-11-07 NOTE — Patient Instructions (Addendum)
Licensed Clinical Social Worker Visit Information  Materials Provided: No  11/07/2019  Name: Jennifer [name]           MRN: [id number]       DOB: [birth year]  Jennifer [name] is a 69 y.o. year old female who is a primary care patient of Jennifer Ip, DO. The CCM team was consulted for assistance with [employer] .   Review of patient status, including review of consultants reports, other relevant assessments, and collaboration with appropriate care team members and the patient's provider was performed as part of comprehensive patient evaluation and provision of chronic care management services.    SDOH (Social Determinants of Health) assessments performed: No;risk for tobacco use; risk for depression; risk for physical inactivity  LCSW called client phone numbers several times today but LCSW was not able to speak via phone with client today; however, LCSW did leave phone message for Jennifer [name] asking her to please return call to LCSW at [phone] to discuss social work needs of client  Follow Up Plan:LCSW to call client in next 4 weeks to talk with her about mobility issues of client and about client completion of ADLs  LCSW was not able to speak via phone with client today; thus the client was not able to  verbalize understanding of instructions provided today and was not able to accept or decline a print copy of patient instruction materials.   Jennifer [name].Jennifer [name] MSW, LCSW Licensed Clinical Social Worker [facility] Care Management [phone]

## 2019-11-24 ENCOUNTER — Encounter: Payer: Self-pay | Admitting: Cardiovascular Disease

## 2019-11-24 ENCOUNTER — Ambulatory Visit: Payer: Medicare Other | Admitting: Cardiovascular Disease

## 2019-11-24 ENCOUNTER — Other Ambulatory Visit: Payer: Self-pay

## 2019-11-24 VITALS — BP 130/74 | HR 67 | Ht 66.0 in | Wt 229.0 lb

## 2019-11-24 DIAGNOSIS — E1169 Type 2 diabetes mellitus with other specified complication: Secondary | ICD-10-CM

## 2019-11-24 DIAGNOSIS — I152 Hypertension secondary to endocrine disorders: Secondary | ICD-10-CM

## 2019-11-24 DIAGNOSIS — Z9989 Dependence on other enabling machines and devices: Secondary | ICD-10-CM

## 2019-11-24 DIAGNOSIS — I739 Peripheral vascular disease, unspecified: Secondary | ICD-10-CM

## 2019-11-24 DIAGNOSIS — G4733 Obstructive sleep apnea (adult) (pediatric): Secondary | ICD-10-CM

## 2019-11-24 DIAGNOSIS — E785 Hyperlipidemia, unspecified: Secondary | ICD-10-CM

## 2019-11-24 DIAGNOSIS — I10 Essential (primary) hypertension: Secondary | ICD-10-CM

## 2019-11-24 DIAGNOSIS — F17200 Nicotine dependence, unspecified, uncomplicated: Secondary | ICD-10-CM

## 2019-11-24 DIAGNOSIS — E1159 Type 2 diabetes mellitus with other circulatory complications: Secondary | ICD-10-CM | POA: Diagnosis not present

## 2019-11-24 NOTE — Assessment & Plan Note (Signed)
Ongoing tobacco abuse of 1/2 to 1 pack/day for the last 45 years recalcitrant risk factor modification.

## 2019-11-24 NOTE — Progress Notes (Signed)
11/24/2019 Jennifer [name]   1951/02/21  [id number]  Primary Physician Raliegh Ip, DO Primary Cardiologist: Runell Gess MD Nicholes [place], [place]  HPI:  Jennifer [name] is a 69 y.o. 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 Charlsie Merles, her podiatrist for peripheral vascular evaluation because of foot pain.  I last saw her in the office 09/21/2017. Currently he could not feel a pulse on exam.  Her other medical problems include a long history of tobacco abuse smoking 1/2 to 1 pack/day for last 45 years, hypertension and hyperlipidemia.  She is never had a heart attack or stroke and denies chest pain or shortness of breath.  She really denies claudication as well.  Recent Dopplers performed 09/15/2017 revealed a right ABI of 0.93 and a left ABI 0.67 with an occluded left SFA.  Since I saw her back 2 years ago she has retired.  She does complain of some mild dyspnea on exertion but this is probably explainable by her long history tobacco abuse.  She really denies chest pain or claudication.   Current Meds  Medication Sig  . acetaminophen (TYLENOL) 500 MG tablet Take 1,000 mg by mouth 2 (two) times daily as needed for mild pain or moderate pain.   Marland Kitchen atenolol (TENORMIN) 25 MG tablet Take 1 tablet (25 mg total) by mouth daily.  Marland Kitchen atorvastatin (LIPITOR) 40 MG tablet Take 1 tablet (40 mg total) by mouth at bedtime.  . Coenzyme Q-10 100 MG capsule Take 2 capsules (200 mg total) by mouth daily.  Marland Kitchen ibuprofen (ADVIL) 800 MG tablet TAKE 1 TABLET BY MOUTH THREE TIMES DAILY  . metFORMIN (GLUCOPHAGE) 500 MG tablet Take 1 tablet by mouth once daily with breakfast  . triamterene-hydrochlorothiazide (MAXZIDE-25) 37.5-25 MG tablet Take 1 tablet by mouth daily.     Allergies  Allergen Reactions  . Hydrocodone Itching  . Oxycodone Nausea And Vomiting    Pt stated,  "It makes me not want to eat"    Social History   Socioeconomic History  . Marital status: Married    Spouse name: Not on file  . Number of children: 3  . Years of education: 53  . Highest education level: Not on file  Occupational History  . Occupation: CNa    Employer: [employer]  Tobacco Use  . Smoking status: Current Every Day Smoker    Packs/day: 0.50    Years: 35.00    Pack years: 17.50    Types: Cigarettes  . Smokeless tobacco: Never Used  Vaping Use  . Vaping Use: Never used  Substance and Sexual Activity  . Alcohol use: No  . Drug use: No  . Sexual activity: Yes  Other Topics Concern  . Not on file  Social History Narrative  . Not on file   Social Determinants of Health   Financial Resource Strain: Low Risk   . Difficulty of Paying Living Expenses: Not very hard  Food Insecurity: No Food Insecurity  . Worried About Programme researcher, broadcasting/film/video in the Last Year: Never true  . Ran Out of Food in the Last Year: Never true  Transportation Needs: No Transportation Needs  . Lack of Transportation (Medical): No  . Lack of Transportation (Non-Medical): No  Physical Activity: Inactive  . Days of Exercise per Week: 0 days  . Minutes of Exercise  per Session: 0 min  Stress: No Stress Concern Present  . Feeling of Stress : Only a little  Social Connections: Moderately Integrated  . Frequency of Communication with Friends and Family: Three times a week  . Frequency of Social Gatherings with Friends and Family: Once a week  . Attends Religious Services: 1 to 4 times per year  . Active Member of Clubs or Organizations: No  . Attends Banker Meetings: Never  . Marital Status: Married  Catering manager Violence: Not At Risk  . Fear of Current or Ex-Partner: No  . Emotionally Abused: No  . Physically Abused: No  . Sexually Abused: No     Review of Systems: General: negative for chills, fever, night sweats or weight changes.  Cardiovascular: negative for chest  pain, dyspnea on exertion, edema, orthopnea, palpitations, paroxysmal nocturnal dyspnea or shortness of breath Dermatological: negative for rash Respiratory: negative for cough or wheezing Urologic: negative for hematuria Abdominal: negative for nausea, vomiting, diarrhea, bright red blood per rectum, melena, or hematemesis Neurologic: negative for visual changes, syncope, or dizziness All other systems reviewed and are otherwise negative except as noted above.    Blood pressure 130/74, pulse 67, height 5\' 6"  (1.676 m), weight 229 lb (103.9 kg), SpO2 95 %.  General appearance: alert and no distress Neck: no adenopathy, no carotid bruit, no JVD, supple, symmetrical, trachea midline and thyroid not enlarged, symmetric, no tenderness/mass/nodules Lungs: clear to auscultation bilaterally Heart: regular rate and rhythm, S1, S2 normal, no murmur, click, rub or gallop Extremities: extremities normal, atraumatic, no cyanosis or edema Pulses: 2+ and symmetric Skin: Skin color, texture, turgor normal. No rashes or lesions Neurologic: Alert and oriented X 3, normal strength and tone. Normal symmetric reflexes. Normal coordination and gait  EKG sinus rhythm at 67 with incomplete right bundle branch block.  I personally reviewed this EKG.  ASSESSMENT AND PLAN:   Hypertension associated with diabetes (HCC) History of essential hypertension blood pressure measured today 130/74.  She is on atenolol and Maxide.  Hyperlipidemia associated with type 2 diabetes mellitus (HCC) History of hyperlipidemia on statin therapy with lipid profile performed 01/24/2018 revealing total pressure 151, LDL 71 and HDL of 38  Tobacco use disorder Ongoing tobacco abuse of 1/2 to 1 pack/day for the last 45 years recalcitrant risk factor modification.  Peripheral arterial disease (HCC) History of peripheral arterial disease with Dopplers performed 09/14/2017 revealing a right ABI of 0.93 and a left of 0.67 with what appears  to be an occluded mid left SFA.  She does not complain of significant lifestyle limiting claudication however and does not wish to have an interventional procedure.  OSA on CPAP History of obstructive sleep apnea on CPAP      09/16/2017 MD [facility] 11/24/2019 12:28 PM

## 2019-11-24 NOTE — Assessment & Plan Note (Signed)
History of peripheral arterial disease with Dopplers performed 09/14/2017 revealing a right ABI of 0.93 and a left of 0.67 with what appears to be an occluded mid left SFA.  She does not complain of significant lifestyle limiting claudication however and does not wish to have an interventional procedure.

## 2019-11-24 NOTE — Assessment & Plan Note (Signed)
History of obstructive sleep apnea on CPAP. 

## 2019-11-24 NOTE — Patient Instructions (Signed)
Medication Instructions:  Your physician recommends that you continue on your current medications as directed. Please refer to the Current Medication list given to you today.  *If you need a refill on your cardiac medications before your next appointment, please call your pharmacy*   Follow-Up: At [facility], you and your health needs are our priority.  As part of our continuing mission to provide you with exceptional heart care, we have created designated Provider Care Teams.  These Care Teams include your primary Cardiologist (physician) and Advanced Practice Providers (APPs -  Physician Assistants and Nurse Practitioners) who all work together to provide you with the care you need, when you need it.  We recommend signing up for the patient portal called "MyChart".  Sign up information is provided on this After Visit Summary.  MyChart is used to connect with patients for Virtual Visits (Telemedicine).  Patients are able to view lab/test results, encounter notes, upcoming appointments, etc.  Non-urgent messages can be sent to your provider as well.   To learn more about what you can do with MyChart, go to [email or web address].    Your next appointment:   12 month(s)  The format for your next appointment:   In Person  Provider:   You may see Dr. Berry or one of the following Advanced Practice Providers on your designated Care Team:    Luke Kilroy, PA-C  Callie Goodrich, PA-C  Jesse Cleaver, FNP    Other Instructions   

## 2019-11-24 NOTE — Assessment & Plan Note (Signed)
History of hyperlipidemia on statin therapy with lipid profile performed 01/24/2018 revealing total pressure 151, LDL 71 and HDL of 38

## 2019-11-24 NOTE — Assessment & Plan Note (Signed)
History of essential hypertension blood pressure measured today 130/74.  She is on atenolol and Maxide.

## 2019-12-01 DIAGNOSIS — G4733 Obstructive sleep apnea (adult) (pediatric): Secondary | ICD-10-CM | POA: Diagnosis not present

## 2019-12-11 ENCOUNTER — Ambulatory Visit (INDEPENDENT_AMBULATORY_CARE_PROVIDER_SITE_OTHER): Payer: Medicare Other | Admitting: Licensed Clinical Social Worker

## 2019-12-11 DIAGNOSIS — M1811 Unilateral primary osteoarthritis of first carpometacarpal joint, right hand: Secondary | ICD-10-CM

## 2019-12-11 DIAGNOSIS — E1151 Type 2 diabetes mellitus with diabetic peripheral angiopathy without gangrene: Secondary | ICD-10-CM

## 2019-12-11 DIAGNOSIS — I10 Essential (primary) hypertension: Secondary | ICD-10-CM | POA: Diagnosis not present

## 2019-12-11 DIAGNOSIS — I152 Hypertension secondary to endocrine disorders: Secondary | ICD-10-CM

## 2019-12-11 DIAGNOSIS — E1169 Type 2 diabetes mellitus with other specified complication: Secondary | ICD-10-CM | POA: Diagnosis not present

## 2019-12-11 DIAGNOSIS — Z96651 Presence of right artificial knee joint: Secondary | ICD-10-CM

## 2019-12-11 DIAGNOSIS — E785 Hyperlipidemia, unspecified: Secondary | ICD-10-CM | POA: Diagnosis not present

## 2019-12-11 DIAGNOSIS — I739 Peripheral vascular disease, unspecified: Secondary | ICD-10-CM

## 2019-12-11 DIAGNOSIS — E1159 Type 2 diabetes mellitus with other circulatory complications: Secondary | ICD-10-CM | POA: Diagnosis not present

## 2019-12-11 NOTE — Patient Instructions (Addendum)
Licensed Clinical Social Worker Visit Information  Goals we discussed today:  .  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, PAD, Type 2 DM, HLD, OA, HTN  Clinical Social Work Clinical Goal(s):   LCSW to call client in next 30 days to discuss mobility issues of client and client completion of ADLs  Interventions:  Talked with Jennifer [name], spouse of client, about CCM program services  Talked with Jennifer about RNCM support with CCM program  Talked with Jennifer about pain issues faced (client has pain in knee)  Talked with Jennifer about sleeping issues of client (client said she uses CPAP to help her sleep at night)  Talked with Jennifer about ambulation needs of client   Talked with Jennifer about client's upcoming client medical appointments  Talked with Jennifer about ADLs completion of client  Talked with Jennifer about meal preparation for client  Talked with Jennifer about transport needs of client  Talked with Jennifer [name] about social support network for client (her sister is supportive; her spouse is supportive)  Patient Self Care Activities:   Drives self as needed to medical appointments Completes ADLs independently Prepares meals daily   Patient Self Care Deficits:   Mobility issues  Pain issues  Initial goal documentation     Follow Up Plan: LCSW to call client /spouse of client in next 4 weeks to talk with her /spouse of client about mobility issues of client and about client completion of ADLs  Materials Provided: No  The patient/Jennifer [name], spouse of client, verbalized understanding of instructions provided today and declined a print copy of patient instruction materials.   [name].Jermany Sundell MSW, LCSW Licensed Clinical Social Worker [facility] Care  Management [phone]

## 2019-12-11 NOTE — Chronic Care Management (AMB) (Signed)
Chronic Care Management    Clinical Social Work Follow Up Note  12/11/2019 Name: Jennifer [name] MRN: [id number] DOB: 06-04-1950  Jennifer [name] is a 69 y.o. year old female who is a primary care patient of Jennifer Ip, DO. The CCM team was consulted for assistance with [employer] .   Review of patient status, including review of consultants reports, other relevant assessments, and collaboration with appropriate care team members and the patient's provider was performed as part of comprehensive patient evaluation and provision of chronic care management services.    SDOH (Social Determinants of Health) assessments performed: No; risk for tobacco use; risk for depression; risk for stress; risk for physical inactivity    Chronic Care Management from 10/03/2019 in [facility]  PHQ-9 Total Score 3       GAD 7 : Generalized Anxiety Score 10/03/2019  Nervous, Anxious, on Edge 0  Control/stop worrying 0  Worry too much - different things 0  Trouble relaxing 0  Restless 0  Easily annoyed or irritable 0  Afraid - awful might happen 0  Total GAD 7 Score 0  Anxiety Difficulty Somewhat difficult    Outpatient Encounter Medications as of 12/11/2019  Medication Sig  . acetaminophen (TYLENOL) 500 MG tablet Take 1,000 mg by mouth 2 (two) times daily as needed for mild pain or moderate pain.   Marland Kitchen atenolol (TENORMIN) 25 MG tablet Take 1 tablet (25 mg total) by mouth daily.  Marland Kitchen atorvastatin (LIPITOR) 40 MG tablet Take 1 tablet (40 mg total) by mouth at bedtime.  . Coenzyme Q-10 100 MG capsule Take 2 capsules (200 mg total) by mouth daily.  Marland Kitchen ibuprofen (ADVIL) 800 MG tablet TAKE 1 TABLET BY MOUTH THREE TIMES DAILY  . metFORMIN (GLUCOPHAGE) 500 MG tablet Take 1 tablet by mouth once daily with breakfast  . triamterene-hydrochlorothiazide (MAXZIDE-25) 37.5-25 MG tablet Take 1 tablet by mouth daily.   No facility-administered encounter medications on file as of  12/11/2019.    Goals    .  Client will talkwith me in next 30 days about mobility issues and client completion of dailily activities (pt-stated)      CARE PLAN ENTRY   Current Barriers:  . 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 Talked with [name], spouse of client, about CCM program services . Talked with [name] about RNCM support with CCM program . Talked with [name] about pain issues faced (client has pain in knee) . Talked with [name] about sleeping issues of client (client said she uses CPAP to help her sleep at night) . Talked with [name] about ambulation needs of client  . Talked with [name] about client's upcoming client medical appointments . Talked with [name] about ADLs completion of client . Talked with [name] about meal preparation for client . Talked with [name] about transport needs of client . Talked with [name] about social support network for client (her sister is supportive; her spouse is supportive)  Patient Self Care Activities:   Drives self as needed to medical appointments Completes ADLs independently Prepares meals daily   Patient Self Care Deficits:  Marland Kitchen Mobility issues . Pain issues  Initial goal documentation     Follow Up Plan: LCSW to call client /spouse of  client in next 4 weeks to talk with her /spouse of client about mobility issues of client and about client completion of ADLs  [name].Rosanne Wohlfarth MSW, LCSW Licensed Clinical Social Worker [facility] Care Management [phone]

## 2019-12-16 ENCOUNTER — Other Ambulatory Visit: Payer: Self-pay | Admitting: Family Medicine

## 2019-12-16 DIAGNOSIS — M1990 Unspecified osteoarthritis, unspecified site: Secondary | ICD-10-CM

## 2020-01-03 ENCOUNTER — Telehealth: Payer: Self-pay

## 2020-01-03 NOTE — Telephone Encounter (Signed)
FYI: when I called patient to set up appt for DXA patient wanted me to inform Dr. Nadine Counts that her son (one she had discussed with her before) has passed away.

## 2020-01-12 ENCOUNTER — Other Ambulatory Visit: Payer: Medicare Other

## 2020-01-16 ENCOUNTER — Ambulatory Visit (INDEPENDENT_AMBULATORY_CARE_PROVIDER_SITE_OTHER): Payer: Medicare Other | Admitting: Licensed Clinical Social Worker

## 2020-01-16 DIAGNOSIS — E1151 Type 2 diabetes mellitus with diabetic peripheral angiopathy without gangrene: Secondary | ICD-10-CM

## 2020-01-16 DIAGNOSIS — I739 Peripheral vascular disease, unspecified: Secondary | ICD-10-CM

## 2020-01-16 DIAGNOSIS — I152 Hypertension secondary to endocrine disorders: Secondary | ICD-10-CM

## 2020-01-16 DIAGNOSIS — I10 Essential (primary) hypertension: Secondary | ICD-10-CM | POA: Diagnosis not present

## 2020-01-16 DIAGNOSIS — E1169 Type 2 diabetes mellitus with other specified complication: Secondary | ICD-10-CM

## 2020-01-16 DIAGNOSIS — M1811 Unilateral primary osteoarthritis of first carpometacarpal joint, right hand: Secondary | ICD-10-CM

## 2020-01-16 DIAGNOSIS — E1159 Type 2 diabetes mellitus with other circulatory complications: Secondary | ICD-10-CM

## 2020-01-16 DIAGNOSIS — Z96651 Presence of right artificial knee joint: Secondary | ICD-10-CM

## 2020-01-16 DIAGNOSIS — E785 Hyperlipidemia, unspecified: Secondary | ICD-10-CM | POA: Diagnosis not present

## 2020-01-16 NOTE — Chronic Care Management (AMB) (Signed)
Chronic Care Management    Clinical Social Work Follow Up Note  01/16/2020 Name: Jennifer [name] MRN: [id number] DOB: 04/02/1951  Jennifer [name] is a 69 y.o. year old female who is a primary care patient of Raliegh Ip, DO. The CCM team was consulted for assistance with [employer] .   Review of patient status, including review of consultants reports, other relevant assessments, and collaboration with appropriate care team members and the patient's provider was performed as part of comprehensive patient evaluation and provision of chronic care management services.    SDOH (Social Determinants of Health) assessments performed: No;risk for tobacco use; risk for depression; risk for stress; risk for physical inactivity     Chronic Care Management from 10/03/2019 in [facility]  PHQ-9 Total Score 3      GAD 7 : Generalized Anxiety Score 10/03/2019  Nervous, Anxious, on Edge 0  Control/stop worrying 0  Worry too much - different things 0  Trouble relaxing 0  Restless 0  Easily annoyed or irritable 0  Afraid - awful might happen 0  Total GAD 7 Score 0  Anxiety Difficulty Somewhat difficult    Outpatient Encounter Medications as of 01/16/2020  Medication Sig  . acetaminophen (TYLENOL) 500 MG tablet Take 1,000 mg by mouth 2 (two) times daily as needed for mild pain or moderate pain.   Marland Kitchen atenolol (TENORMIN) 25 MG tablet Take 1 tablet (25 mg total) by mouth daily.  Marland Kitchen atorvastatin (LIPITOR) 40 MG tablet Take 1 tablet (40 mg total) by mouth at bedtime.  . Coenzyme Q-10 100 MG capsule Take 2 capsules (200 mg total) by mouth daily.  Marland Kitchen ibuprofen (ADVIL) 800 MG tablet TAKE 1 TABLET BY MOUTH THREE TIMES DAILY  . metFORMIN (GLUCOPHAGE) 500 MG tablet Take 1 tablet by mouth once daily with breakfast  . triamterene-hydrochlorothiazide (MAXZIDE-25) 37.5-25 MG tablet Take 1 tablet by mouth daily.   No facility-administered encounter medications on file as of  01/16/2020.    Goals    .           Marland Kitchen  Client will talkwith me in next 30 days about mobility issues and client completion of dailily activities (pt-stated)      CARE PLAN ENTRY   Current Barriers:  . 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 Talked with clinet about CCM program services . Talked with client about RNCM support with CCM program . Talked with client about pain issues faced . Talked with client about sleeping issues of client (client said she uses CPAP to help her sleep at night) . Talked with client about ambulation needs of client  . Talked with client about her upcoming client medical appointments . Talked with client about relaxation techniques (likes to listen to music, watch old movies, does puzzles, enjoys talking via phone with sister) . Talked with client about ADLs completion . Talked with client about meal preparation for client . Talked with client about transport needs of client . Provided counseling support for client (client said that her youngest son died unexpectedly in Dec 25, 2019). . Talked with client about grief issues faced and support services in area. . Talked with client about her current family support . Talked with client about support  from her church family  . Talked with client about [facility] and resources related to grief support through that agency  Patient Self Care Activities:   Drives self as needed to medical appointments Completes ADLs independently Prepares meals daily   Patient Self Care Deficits:  Marland Kitchen Mobility issues . Pain issues  Initial goal documentation     Follow Up Plan: LCSW to call client /spouse of client in next 4 weeks to talk with her /spouse of client about mobility  issues of client and about client completion of ADLs  [name].Shevonne Wolf MSW, LCSW Licensed Clinical Social Worker [facility] [phone]

## 2020-01-16 NOTE — Patient Instructions (Addendum)
Licensed Clinical Social Worker Visit Information  Goals we discussed today:    .  Client will talkwith me in next 30 days about mobility issues and client completion of dailily activities (pt-stated)       CARE PLAN ENTRY   Current Barriers:   OSA in patient with chronic diagnoses of s/p total knee arthroplasty, PAD, Type 2 DM, HLD, OA, HTN  Clinical Social Work Clinical Goal(s):   LCSW to call client in next 30 days to discuss mobility issues of client and client completion of ADLs  Interventions:  Talked with clinet about CCM program services  Talked with client about RNCM support with CCM program  Talked with client about pain issues faced  Talked with client about sleeping issues of client (client said she uses CPAP to help her sleep at night)  Talked with client about ambulation needs of client   Talked with client about her upcoming client medical appointments  Talked with client about relaxation techniques (likes to listen to music, watch old movies, does puzzles, enjoys talking via phone with sister)  Talked with client about ADLs completion  Talked with client about meal preparation for client  Talked with client about transport needs of client  Provided counseling support for client (client said that her youngest son died unexpectedly in 2019-12-17).  Talked with client about grief issues faced and support services in area.  Talked with client about her current family support  Talked with client about support from her church family   Talked with client about [facility] and resources related to grief support through that agency  Patient Self Care Activities:   Drives self as needed to medical appointments Completes ADLs independently Prepares meals daily   Patient Self Care Deficits:   Mobility issues  Pain issues  Initial goal documentation     Follow Up Plan: LCSW to call client/spouse of clientin next  4 weeks to talk with her/spouse of clientabout mobility issues of client and about client completion of ADLs  Materials Provided: No  The patient verbalized understanding of instructions provided today and declined a print copy of patient instruction materials.   [name].Kelley Polinsky MSW, LCSW Licensed Clinical Social Worker [facility] [phone]

## 2020-01-22 ENCOUNTER — Ambulatory Visit (INDEPENDENT_AMBULATORY_CARE_PROVIDER_SITE_OTHER): Payer: Medicare Other | Admitting: Adult Health

## 2020-01-22 VITALS — BP 116/68 | HR 73 | Ht 65.0 in | Wt 230.0 lb

## 2020-01-22 DIAGNOSIS — Z9989 Dependence on other enabling machines and devices: Secondary | ICD-10-CM

## 2020-01-22 DIAGNOSIS — G4733 Obstructive sleep apnea (adult) (pediatric): Secondary | ICD-10-CM | POA: Diagnosis not present

## 2020-01-22 NOTE — Progress Notes (Signed)
PATIENT: Jennifer [name] DOB: 1950-05-19  REASON FOR VISIT: follow up HISTORY FROM: patient  HISTORY OF PRESENT ILLNESS: Today 01/22/20:  Jennifer [name] is a 69 year old female with a history of obstructive sleep apnea on CPAP.  Her download indicates that she use her machine nightly for compliance of 100%.  On average she uses her machine 6 hours and 50 minutes.  Her residual AHI is 2.3 on 9 cm of water with EPR 3.  Leak in the 95th percentile is 6.  She reports that the CPAP is working well.  She returns today for follow-up.  HISTORY 07/13/19:  Jennifer [name] is a 69 year old female with a history of obstructive sleep apnea on CPAP.  Her download indicates that she use her machine nightly for compliance of 100%.  She used her machine greater than 4 hours each night.  On average she uses her machine 7 hours and 10 minutes.  Her residual AHI is 2.6 on 9 cm of water with EPR 3.  Leak in the 95th percentile is 0.7 L/min.  Reports that initially she could see a benefit however recently she has been having to take naps.  She returns today for an evaluation  REVIEW OF SYSTEMS: Out of a complete 14 system review of symptoms, the patient complains only of the following symptoms, and all other reviewed systems are negative.  FSS 19 ESS 3  ALLERGIES: Allergies  Allergen Reactions  . Hydrocodone Itching  . Oxycodone Nausea And Vomiting    Pt stated, "It makes me not want to eat"    HOME MEDICATIONS: Outpatient Medications Prior to Visit  Medication Sig Dispense Refill  . acetaminophen (TYLENOL) 500 MG tablet Take 1,000 mg by mouth 2 (two) times daily as needed for mild pain or moderate pain.     Marland Kitchen atenolol (TENORMIN) 25 MG tablet Take 1 tablet (25 mg total) by mouth daily. 90 tablet 4  . atorvastatin (LIPITOR) 40 MG tablet Take 1 tablet (40 mg total) by mouth at bedtime. 90 tablet 3  . Coenzyme Q-10 100 MG capsule Take 2 capsules (200 mg total) by mouth daily. 60 capsule 6  . ibuprofen  (ADVIL) 800 MG tablet TAKE 1 TABLET BY MOUTH THREE TIMES DAILY 90 tablet 0  . metFORMIN (GLUCOPHAGE) 500 MG tablet Take 1 tablet by mouth once daily with breakfast 90 tablet 3  . triamterene-hydrochlorothiazide (MAXZIDE-25) 37.5-25 MG tablet Take 1 tablet by mouth daily. 90 tablet 3   No facility-administered medications prior to visit.    PAST MEDICAL HISTORY: Past Medical History:  Diagnosis Date  . Arthritis   . Dyspnea   . Hyperlipidemia   . Hypertension     PAST SURGICAL HISTORY: Past Surgical History:  Procedure Laterality Date  . ABDOMINAL HYSTERECTOMY    . COLONOSCOPY N/A 11/25/2016   Procedure: COLONOSCOPY;  Surgeon: Malissa Hippo, MD;  Location: [facility];  Service: Endoscopy;  Laterality: N/A;  830  . KNEE ARTHROSCOPY     BIL   2010   . LUNG BIOPSY     RIGHT 30 YRS AGO   . TOTAL KNEE ARTHROPLASTY Right 02/12/2014   Procedure: TOTAL KNEE ARTHROPLASTY;  Surgeon: Dannielle Huh, MD;  Location: [facility] OR;  Service: Orthopedics;  Laterality: Right;    FAMILY HISTORY: Family History  Problem Relation Age of Onset  . Diabetes Mother   . Stroke Mother   . Alcohol abuse Father   . Stroke Father   . Colon cancer Neg Hx  SOCIAL HISTORY: Social History   Socioeconomic History  . Marital status: Married    Spouse name: Not on file  . Number of children: 3  . Years of education: 31  . Highest education level: Not on file  Occupational History  . Occupation: CNa    Employer: [employer]  Tobacco Use  . Smoking status: Current Every Day Smoker    Packs/day: 0.50    Years: 35.00    Pack years: 17.50    Types: Cigarettes  . Smokeless tobacco: Never Used  Vaping Use  . Vaping Use: Never used  Substance and Sexual Activity  . Alcohol use: No  . Drug use: No  . Sexual activity: Yes  Other Topics Concern  . Not on file  Social History Narrative  . Not on file   Social Determinants of Health   Financial Resource Strain: Low Risk   . Difficulty of Paying  Living Expenses: Not very hard  Food Insecurity: No Food Insecurity  . Worried About Programme researcher, broadcasting/film/video in the Last Year: Never true  . Ran Out of Food in the Last Year: Never true  Transportation Needs: No Transportation Needs  . Lack of Transportation (Medical): No  . Lack of Transportation (Non-Medical): No  Physical Activity: Inactive  . Days of Exercise per Week: 0 days  . Minutes of Exercise per Session: 0 min  Stress: No Stress Concern Present  . Feeling of Stress : Only a little  Social Connections: Moderately Integrated  . Frequency of Communication with Friends and Family: Three times a week  . Frequency of Social Gatherings with Friends and Family: Once a week  . Attends Religious Services: 1 to 4 times per year  . Active Member of Clubs or Organizations: No  . Attends Banker Meetings: Never  . Marital Status: Married  Catering manager Violence: Not At Risk  . Fear of Current or Ex-Partner: No  . Emotionally Abused: No  . Physically Abused: No  . Sexually Abused: No      PHYSICAL EXAM  Vitals:   01/22/20 1051  BP: 116/68  Pulse: 73  Weight: 230 lb (104.3 kg)  Height: 5\' 5"  (1.651 m)   Body mass index is 38.27 kg/m.  Generalized: Well developed, in no acute distress  Chest: Lungs clear to auscultation bilaterally  Neurological examination  Mentation: Alert oriented to time, place, history taking. Follows all commands speech and language fluent Cranial nerve II-XII: Extraocular movements were full, visual field were full on confrontational test Head turning and shoulder shrug  were normal and symmetric. Motor: The motor testing reveals 5 over 5 strength of all 4 extremities. Good symmetric motor tone is noted throughout.  Sensory: Sensory testing is intact to soft touch on all 4 extremities. No evidence of extinction is noted.  Gait and station: Gait is normal.    DIAGNOSTIC DATA (LABS, IMAGING, TESTING) - I reviewed patient records, labs,  notes, testing and imaging myself where available.  Lab Results  Component Value Date   WBC 6.7 08/16/2017   HGB 13.8 08/16/2017   HCT 40.8 08/16/2017   MCV 84 08/16/2017   PLT 262 08/16/2017      Component Value Date/Time   NA 135 07/20/2019 1536   K 3.8 07/20/2019 1536   CL 97 07/20/2019 1536   CO2 24 07/20/2019 1536   GLUCOSE 94 07/20/2019 1536   GLUCOSE 123 (H) 02/13/2014 0553   BUN 19 07/20/2019 1536   CREATININE 0.89 07/20/2019  1536   CALCIUM 9.2 07/20/2019 1536   PROT 7.2 07/20/2019 1536   ALBUMIN 4.1 07/20/2019 1536   AST 15 07/20/2019 1536   ALT 12 07/20/2019 1536   ALKPHOS 115 07/20/2019 1536   BILITOT <0.2 07/20/2019 1536   GFRNONAA 68 07/20/2019 1536   GFRAA 78 07/20/2019 1536   Lab Results  Component Value Date   CHOL 151 01/24/2018   HDL 38 (L) 01/24/2018   LDLCALC 71 01/24/2018   TRIG 208 (H) 01/24/2018   CHOLHDL 4.0 01/24/2018   Lab Results  Component Value Date   HGBA1C 6.8 07/20/2019   Lab Results  Component Value Date   VITAMINB12 616 07/20/2019   No results found for: TSH    ASSESSMENT AND PLAN 69 y.o. year old female  has a past medical history of Arthritis, Dyspnea, Hyperlipidemia, and Hypertension. here with:  1. OSA on CPAP  - CPAP compliance excellent - Good treatment of AHI  - Encourage patient to use CPAP nightly and > 4 hours each night - F/U in 1 year or sooner if needed   I spent 25 minutes of face-to-face and non-face-to-face time with patient.  This included previsit chart review, lab review, study review, order entry, electronic health record documentation, patient education.  Butch Penny, MSN, NP-C 01/22/2020, 11:03 AM [facility] [address], [address] [place], [place] [address] [phone]

## 2020-01-22 NOTE — Patient Instructions (Signed)

## 2020-01-31 DIAGNOSIS — G4733 Obstructive sleep apnea (adult) (pediatric): Secondary | ICD-10-CM | POA: Diagnosis not present

## 2020-02-12 ENCOUNTER — Other Ambulatory Visit: Payer: Self-pay | Admitting: Family Medicine

## 2020-02-12 DIAGNOSIS — M1990 Unspecified osteoarthritis, unspecified site: Secondary | ICD-10-CM

## 2020-02-20 DIAGNOSIS — G4733 Obstructive sleep apnea (adult) (pediatric): Secondary | ICD-10-CM | POA: Diagnosis not present

## 2020-02-21 ENCOUNTER — Telehealth: Payer: Medicare Other

## 2020-03-02 DIAGNOSIS — G4733 Obstructive sleep apnea (adult) (pediatric): Secondary | ICD-10-CM | POA: Diagnosis not present

## 2020-03-08 ENCOUNTER — Ambulatory Visit (INDEPENDENT_AMBULATORY_CARE_PROVIDER_SITE_OTHER): Payer: Medicare Other

## 2020-03-08 ENCOUNTER — Other Ambulatory Visit: Payer: Self-pay

## 2020-03-08 DIAGNOSIS — Z23 Encounter for immunization: Secondary | ICD-10-CM | POA: Diagnosis not present

## 2020-03-26 ENCOUNTER — Other Ambulatory Visit: Payer: Self-pay

## 2020-03-26 ENCOUNTER — Ambulatory Visit (INDEPENDENT_AMBULATORY_CARE_PROVIDER_SITE_OTHER): Payer: Medicare Other | Admitting: Family Medicine

## 2020-03-26 ENCOUNTER — Encounter: Payer: Self-pay | Admitting: Family Medicine

## 2020-03-26 VITALS — BP 117/67 | HR 73 | Temp 96.5°F | Ht 65.0 in | Wt 231.8 lb

## 2020-03-26 DIAGNOSIS — G5603 Carpal tunnel syndrome, bilateral upper limbs: Secondary | ICD-10-CM

## 2020-03-26 DIAGNOSIS — I152 Hypertension secondary to endocrine disorders: Secondary | ICD-10-CM

## 2020-03-26 DIAGNOSIS — E1169 Type 2 diabetes mellitus with other specified complication: Secondary | ICD-10-CM

## 2020-03-26 DIAGNOSIS — E1159 Type 2 diabetes mellitus with other circulatory complications: Secondary | ICD-10-CM

## 2020-03-26 DIAGNOSIS — E1151 Type 2 diabetes mellitus with diabetic peripheral angiopathy without gangrene: Secondary | ICD-10-CM | POA: Diagnosis not present

## 2020-03-26 DIAGNOSIS — M25562 Pain in left knee: Secondary | ICD-10-CM | POA: Diagnosis not present

## 2020-03-26 DIAGNOSIS — G8929 Other chronic pain: Secondary | ICD-10-CM | POA: Diagnosis not present

## 2020-03-26 DIAGNOSIS — E785 Hyperlipidemia, unspecified: Secondary | ICD-10-CM

## 2020-03-26 LAB — BAYER DCA HB A1C WAIVED: HB A1C (BAYER DCA - WAIVED): 6.7 % (ref <7.0)

## 2020-03-26 NOTE — Patient Instructions (Signed)
Knee Injection A knee injection is a procedure to get medicine into your knee joint to relieve the pain, swelling, and stiffness of arthritis. Your health care provider uses a needle to inject medicine, which may also help to lubricate and cushion your knee joint. You may need more than one injection. Tell a health care provider about:  Any allergies you have.  All medicines you are taking, including vitamins, herbs, eye drops, creams, and over-the-counter medicines.  Any problems you or family members have had with anesthetic medicines.  Any blood disorders you have.  Any surgeries you have had.  Any medical conditions you have.  Whether you are pregnant or may be pregnant. What are the risks? Generally, this is a safe procedure. However, problems may occur, including:  Infection.  Bleeding.  Symptoms that get worse.  Damage to the area around your knee.  Allergic reaction to any of the medicines.  Skin reactions from repeated injections. What happens before the procedure?  Ask your health care provider about changing or stopping your regular medicines. This is especially important if you are taking diabetes medicines or blood thinners.  Plan to have someone take you home from the hospital or clinic. What happens during the procedure?   You will sit or lie down in a position for your knee to be treated.  The skin over your kneecap will be cleaned with a germ-killing soap.  You will be given a medicine that numbs the area (local anesthetic). You may feel some stinging.  The medicine will be injected into your knee. The needle is carefully placed between your kneecap and your knee. The medicine is injected into the joint space.  The needle will be removed at the end of the procedure.  A bandage (dressing) may be placed over the injection site. The procedure may vary among health care providers and hospitals. What can I expect after the procedure?  Your blood  pressure, heart rate, breathing rate, and blood oxygen level will be monitored until you leave the hospital or clinic.  You may have to move your knee through its full range of motion. This helps to get all the medicine into your joint space.  You will be watched to make sure that you do not have a reaction to the injected medicine.  You may feel more pain, swelling, and warmth than you did before the injection. This reaction may last about 1-2 days. Follow these instructions at home: Medicines  Take over-the-counter and prescription medicines only as told by your doctor.  Do not drive or use heavy machinery while taking prescription pain medicine.  Do not take medicines such as aspirin and ibuprofen unless your health care provider tells you to take them. Injection site care  Follow instructions from your health care provider about: ? How to take care of your puncture site. ? When and how you should change your dressing. ? When you should remove your dressing.  Check your injection area every day for signs of infection. Check for: ? More redness, swelling, or pain after 2 days. ? Fluid or blood. ? Pus or a bad smell. ? Warmth. Managing pain, stiffness, and swelling   If directed, put ice on the injection area: ? Put ice in a plastic bag. ? Place a towel between your skin and the bag. ? Leave the ice on for 20 minutes, 2-3 times per day.  Do not apply heat to your knee.  Raise (elevate) the injection area above the level   of your heart while you are sitting or lying down. General instructions  If you were given a dressing, keep it dry until your health care provider says it can be removed. Ask your health care provider when you can start showering or taking a bath.  Avoid strenuous activities for as long as directed by your health care provider. Ask your health care provider when you can return to your normal activities.  Keep all follow-up visits as told by your health  care provider. This is important. You may need more injections. Contact a health care provider if you have:  A fever.  Warmth in your injection area.  Fluid, blood, or pus coming from your injection site.  Symptoms at your injection site that last longer than 2 days after your procedure. Get help right away if:  Your knee: ? Turns very red. ? Becomes very swollen. ? Is in severe pain. Summary  A knee injection is a procedure to get medicine into your knee joint to relieve the pain, swelling, and stiffness of arthritis.  A needle is carefully placed between your kneecap and your knee to inject medicine into the joint space.  Before the procedure, ask your health care provider about changing or stopping your regular medicines, especially if you are taking diabetes medicines or blood thinners.  Contact your health care provider if you have any problems or questions after your procedure. This information is not intended to replace advice given to you by your health care provider. Make sure you discuss any questions you have with your health care provider. Document Revised: 04/26/2017 Document Reviewed: 04/26/2017 Elsevier Patient Education  2020 [employer]  

## 2020-03-26 NOTE — Progress Notes (Signed)
Subjective: CC: DM PCP: Janora Norlander, DO VQM:[id number] Jennifer [name] is a 69 y.o. female presenting to clinic today for:  1. Type 2 Diabetes with hypertension, hyperlipidemia:  Patient reports compliance with metformin 500 mg daily, triamterene-hydrochlorothiazide, Lipitor 40 mg daily and atenolol 25 mg daily.  Blood sugars have been relatively good running 110 in the evenings.  She denies any chest pain, shortness of breath, dizziness, polydipsia or polyuria.  She does report some numbness and tingling in her hands but denies any in her feet.   Last eye exam: Needs Last foot exam: Up-to-date Last A1c:  Lab Results  Component Value Date   HGBA1C 6.8 07/20/2019   Nephropathy screen indicated?:  Needs Last flu, zoster and/or pneumovax:  Immunization History  Administered Date(s) Administered  . Fluad Quad(high Dose 65+) 02/07/2019, 03/08/2020  . Influenza-Unspecified 01/13/2018  . Moderna SARS-COVID-2 Vaccination 05/25/2019, 06/22/2019, 03/13/2020  . Pneumococcal Conjugate-13 12/06/2018    2.  Chronic knee pain Patient reports left-sided chronic knee pain.  She uses Biofreeze to help with the knee pain.  She was told that she may need surgery on that left knee but has not really mentioned it again to her orthopedist because she does not want to go through surgery again.  She has history of right knee replacement.  She reports antalgic gait.  She is using Tylenol and ibuprofen but symptoms are not well controlled with this.  Denies any falls.   ROS: Per HPI  Allergies  Allergen Reactions  . Hydrocodone Itching  . Oxycodone Nausea And Vomiting    Pt stated, "It makes me not want to eat"   Past Medical History:  Diagnosis Date  . Arthritis   . Dyspnea   . Hyperlipidemia   . Hypertension     Current Outpatient Medications:  .  acetaminophen (TYLENOL) 500 MG tablet, Take 1,000 mg by mouth 2 (two) times daily as needed for mild pain or moderate pain. , Disp: , Rfl:  .   atenolol (TENORMIN) 25 MG tablet, Take 1 tablet (25 mg total) by mouth daily., Disp: 90 tablet, Rfl: 4 .  atorvastatin (LIPITOR) 40 MG tablet, Take 1 tablet (40 mg total) by mouth at bedtime., Disp: 90 tablet, Rfl: 3 .  Coenzyme Q-10 100 MG capsule, Take 2 capsules (200 mg total) by mouth daily., Disp: 60 capsule, Rfl: 6 .  ibuprofen (ADVIL) 800 MG tablet, TAKE 1 TABLET BY MOUTH THREE TIMES DAILY, Disp: 90 tablet, Rfl: 0 .  metFORMIN (GLUCOPHAGE) 500 MG tablet, Take 1 tablet by mouth once daily with breakfast, Disp: 90 tablet, Rfl: 3 .  triamterene-hydrochlorothiazide (MAXZIDE-25) 37.5-25 MG tablet, Take 1 tablet by mouth daily., Disp: 90 tablet, Rfl: 3 Social History   Socioeconomic History  . Marital status: Married    Spouse name: Not on file  . Number of children: 3  . Years of education: 73  . Highest education level: Not on file  Occupational History  . Occupation: CNa    Employer: [employer]  Tobacco Use  . Smoking status: Current Every Day Smoker    Packs/day: 0.50    Years: 35.00    Pack years: 17.50    Types: Cigarettes  . Smokeless tobacco: Never Used  Vaping Use  . Vaping Use: Never used  Substance and Sexual Activity  . Alcohol use: No  . Drug use: No  . Sexual activity: Yes  Other Topics Concern  . Not on file  Social History Narrative  . Not on  file   Social Determinants of Health   Financial Resource Strain: Low Risk   . Difficulty of Paying Living Expenses: Not very hard  Food Insecurity: No Food Insecurity  . Worried About Charity fundraiser in the Last Year: Never true  . Ran Out of Food in the Last Year: Never true  Transportation Needs: No Transportation Needs  . Lack of Transportation (Medical): No  . Lack of Transportation (Non-Medical): No  Physical Activity: Inactive  . Days of Exercise per Week: 0 days  . Minutes of Exercise per Session: 0 min  Stress: No Stress Concern Present  . Feeling of Stress : Only a little  Social Connections:  Moderately Integrated  . Frequency of Communication with Friends and Family: Three times a week  . Frequency of Social Gatherings with Friends and Family: Once a week  . Attends Religious Services: 1 to 4 times per year  . Active Member of Clubs or Organizations: No  . Attends Archivist Meetings: Never  . Marital Status: Married  Human resources officer Violence: Not At Risk  . Fear of Current or Ex-Partner: No  . Emotionally Abused: No  . Physically Abused: No  . Sexually Abused: No   Family History  Problem Relation Age of Onset  . Diabetes Mother   . Stroke Mother   . Alcohol abuse Father   . Stroke Father   . Colon cancer Neg Hx     Objective: Office vital signs reviewed. BP 117/67   Pulse 73   Temp (!) 96.5 F (35.8 C)   Ht $R'5\' 5"'jm$  (1.651 m)   Wt 231 lb 12.8 oz (105.1 kg)   SpO2 97%   BMI 38.57 kg/m   Physical Examination:  General: Awake, alert, morbidly obese, No acute distress HEENT: Normal, MMM Cardio: regular rate and rhythm, S1S2 heard, no murmurs appreciated Pulm: clear to auscultation bilaterally, no wheezes, rhonchi or rales; normal work of breathing on room air Extremities: warm, well perfused, No edema, cyanosis or clubbing; +2 pulses bilaterally MSK: antalgic gait and station  Left knee: No gross joint swelling, joint effusion, erythema or warmth.  She has fairly good range of motion.  No tenderness to palpation to the patella, patellar tendon, quad tendon.  She has mild tenderness palpation along the lateral joint.  No palpable deformities Skin: dry; intact; no rashes or lesions Neuro: light touch sensation grossly in tact  JOINT INJECTION:  Patient denies allergy to antiseptics (including iodine) and anesthetics.  Patient has a h/o controlled diabetes. No frequent steroid use, use of blood thinners/ antiplatelets.  Patient was given informed consent and a signed copy has been placed in the chart. Appropriate time out was taken. Area prepped and  draped in usual sterile fashion. Anatomic landmarks were identified and injection site was marked.  Ethyl chloride spray was used to numb the area and 1 cc of methylprednisolone 40 mg/ml plus  3 cc of 1% lidocaine without epinephrine was injected into the left knee using a(n) anteriolateral approach.  Resistance met about 90% into the joint.  Needle was redirected and was able to be inserted to the hub. The patient tolerated the procedure well and there were no immediate complications. Estimated blood loss is less than 1 cc.  Post procedure instructions were reviewed and handout outlining these instructions were provided to patient.   Assessment/ Plan: 69 y.o. 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  Continue current regimen.  Continue statin.  Blood pressure controlled. Encourage patient to get eye exam.  Urine micro/albumin obtained today Her left knee was injected with corticosteroid.  Hopefully this will give her some pain relief.  Offered referral back to orthopedics if she would like to hold off on this for now  Encouraged icing of the wrists, use of night splints in the wrist.  Could consider corticosteroid injection.  Discussed that Dr. Warrick Parisian or Ronnald Collum might be able to offer this.  Orders Placed This Encounter  Procedures  . Bayer DCA Hb A1c Waived  . Microalbumin / creatinine urine ratio   No orders of the defined types were placed in this encounter.    Janora Norlander, DO [place] [phone]

## 2020-03-27 ENCOUNTER — Ambulatory Visit: Payer: Medicare Other | Admitting: Licensed Clinical Social Worker

## 2020-03-27 DIAGNOSIS — M1811 Unilateral primary osteoarthritis of first carpometacarpal joint, right hand: Secondary | ICD-10-CM

## 2020-03-27 DIAGNOSIS — E1151 Type 2 diabetes mellitus with diabetic peripheral angiopathy without gangrene: Secondary | ICD-10-CM

## 2020-03-27 DIAGNOSIS — I152 Hypertension secondary to endocrine disorders: Secondary | ICD-10-CM

## 2020-03-27 DIAGNOSIS — E785 Hyperlipidemia, unspecified: Secondary | ICD-10-CM

## 2020-03-27 DIAGNOSIS — E1159 Type 2 diabetes mellitus with other circulatory complications: Secondary | ICD-10-CM

## 2020-03-27 DIAGNOSIS — I739 Peripheral vascular disease, unspecified: Secondary | ICD-10-CM

## 2020-03-27 DIAGNOSIS — E1169 Type 2 diabetes mellitus with other specified complication: Secondary | ICD-10-CM

## 2020-03-27 NOTE — Patient Instructions (Addendum)
Licensed Clinical Social Worker Visit Information  Goals we discussed today:   .  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, PAD, Type 2 DM, HLD, OA, HTN  Clinical Social Work Clinical Goal(s):   LCSW to call client in next 30 days to discuss mobility issues of client and client completion of ADLs  Interventions:  Talked with clinet about CCM program services  Talked with client about RNCM support with CCM program  Talked with client about pain issues faced (she spoke of pain in her left knee)  Talked with client about sleeping issues of client (client said she uses CPAP to help her sleep at night)  Talked with client about ambulation needs of client   Talked with client about her upcoming client medical appointments  Talked with client about relaxation techniques (likes to listen to music, watch old movies, does puzzles, enjoys talking via phone with sister)  Talked with client about ADLs completion  Talked with client about meal preparation for client  Talked with client about transport needs of client  Provided counseling support for client  Talked with client about mood of client (she said she is crying frequently)  Talked with client about grief support with [facility] [place] [place]  Collaborated with [facility] Triage Nurse regarding needs of client  Patient Self Care Activities:   Drives self as needed to medical appointments Completes ADLs independently Prepares meals daily   Patient Self Care Deficits:   Mobility issues  Pain issues  Initial goal documentation     Follow Up Plan: LCSW to call client/spouse of clientin next 4 weeks to talk with her/spouse of clientabout mobility issues of client and about client completion of  ADLs  Materials Provided: No  The patient verbalized understanding of instructions provided today and declined a print copy of patient instruction materials.   [name], LCSW Licensed Clinical Social Worker [facility] Care Management [phone]

## 2020-03-27 NOTE — Chronic Care Management (AMB) (Signed)
Chronic Care Management    Clinical Social Work Follow Up Note  03/27/2020 Name: Jennifer [name] MRN: [id number] DOB: [birth year]  Jennifer [name] is a 69 y.o. year old female who is a primary care patient of Raliegh Ip, DO. The CCM team was consulted for assistance with [employer] .   Review of patient status, including review of consultants reports, other relevant assessments, and collaboration with appropriate care team members and the patient's provider was performed as part of comprehensive patient evaluation and provision of chronic care management services.    SDOH (Social Determinants of Health) assessments performed: No; risk for depression; risk for tobacco use; risk for physical inactivity; risk for social isolation     Chronic Care Management from 03/27/2020 in [facility]  PHQ-9 Total Score 5      GAD 7 : Generalized Anxiety Score 10/03/2019  Nervous, Anxious, on Edge 0  Control/stop worrying 0  Worry too much - different things 0  Trouble relaxing 0  Restless 0  Easily annoyed or irritable 0  Afraid - awful might happen 0  Total GAD 7 Score 0  Anxiety Difficulty Somewhat difficult    Outpatient Encounter Medications as of 03/27/2020  Medication Sig  . acetaminophen (TYLENOL) 500 MG tablet Take 1,000 mg by mouth 2 (two) times daily as needed for mild pain or moderate pain.   Marland Kitchen atenolol (TENORMIN) 25 MG tablet Take 1 tablet (25 mg total) by mouth daily.  Marland Kitchen atorvastatin (LIPITOR) 40 MG tablet Take 1 tablet (40 mg total) by mouth at bedtime.  . Coenzyme Q-10 100 MG capsule Take 2 capsules (200 mg total) by mouth daily.  Marland Kitchen ibuprofen (ADVIL) 800 MG tablet TAKE 1 TABLET BY MOUTH THREE TIMES DAILY  . metFORMIN (GLUCOPHAGE) 500 MG tablet Take 1 tablet by mouth once daily with breakfast  . triamterene-hydrochlorothiazide (MAXZIDE-25) 37.5-25 MG tablet Take 1 tablet by mouth daily.   No facility-administered encounter medications on  file as of 03/27/2020.    Goals    .  Client will talkwith me in next 30 days about mobility issues and client completion of dailily activities (pt-stated)      CARE PLAN ENTRY   Current Barriers:  . 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 Talked with clinet about CCM program services . Talked with client about RNCM support with CCM program . Talked with client about pain issues faced (she spoke of pain in her left knee) . Talked with client about sleeping issues of client (client said she uses CPAP to help her sleep at night) . Talked with client about ambulation needs of client  . Talked with client about her upcoming client medical appointments . Talked with client about relaxation techniques (likes to listen to music, watch old movies, does puzzles, enjoys talking via phone with sister) . Talked with client about ADLs completion . Talked with client about meal preparation for client . Talked with client about transport needs of client . Provided counseling support for client . Talked with client about mood of client (she said she is crying frequently) . Talked with client about grief support with [facility] [place] . Collaborated with [facility] Triage Nurse regarding needs of client  Patient Self Care  Activities:   Drives self as needed to medical appointments Completes ADLs independently Prepares meals daily   Patient Self Care Deficits:  Marland Kitchen Mobility issues . Pain issues  Initial goal documentation     Follow Up Plan:  LCSW to call client/spouse of clientin next 4 weeks to talk with her/spouse of clientabout mobility issues of client and about client completion of ADLs  [name].Donald Memoli MSW, LCSW Licensed Clinical Social  Worker [facility] Care Management [phone]

## 2020-03-30 ENCOUNTER — Other Ambulatory Visit: Payer: Self-pay | Admitting: Family Medicine

## 2020-03-30 DIAGNOSIS — M1990 Unspecified osteoarthritis, unspecified site: Secondary | ICD-10-CM

## 2020-04-01 DIAGNOSIS — G4733 Obstructive sleep apnea (adult) (pediatric): Secondary | ICD-10-CM | POA: Diagnosis not present

## 2020-04-09 ENCOUNTER — Ambulatory Visit: Payer: Medicare Other

## 2020-05-02 ENCOUNTER — Telehealth: Payer: Medicare Other

## 2020-05-02 DIAGNOSIS — G4733 Obstructive sleep apnea (adult) (pediatric): Secondary | ICD-10-CM | POA: Diagnosis not present

## 2020-06-02 DIAGNOSIS — G4733 Obstructive sleep apnea (adult) (pediatric): Secondary | ICD-10-CM | POA: Diagnosis not present

## 2020-06-03 ENCOUNTER — Telehealth: Payer: Self-pay

## 2020-06-03 ENCOUNTER — Ambulatory Visit (INDEPENDENT_AMBULATORY_CARE_PROVIDER_SITE_OTHER): Payer: Medicare Other | Admitting: Licensed Clinical Social Worker

## 2020-06-03 DIAGNOSIS — I152 Hypertension secondary to endocrine disorders: Secondary | ICD-10-CM | POA: Diagnosis not present

## 2020-06-03 DIAGNOSIS — M1712 Unilateral primary osteoarthritis, left knee: Secondary | ICD-10-CM | POA: Diagnosis not present

## 2020-06-03 DIAGNOSIS — I739 Peripheral vascular disease, unspecified: Secondary | ICD-10-CM

## 2020-06-03 DIAGNOSIS — Z96651 Presence of right artificial knee joint: Secondary | ICD-10-CM

## 2020-06-03 DIAGNOSIS — E1151 Type 2 diabetes mellitus with diabetic peripheral angiopathy without gangrene: Secondary | ICD-10-CM | POA: Diagnosis not present

## 2020-06-03 DIAGNOSIS — E785 Hyperlipidemia, unspecified: Secondary | ICD-10-CM | POA: Diagnosis not present

## 2020-06-03 DIAGNOSIS — E1159 Type 2 diabetes mellitus with other circulatory complications: Secondary | ICD-10-CM

## 2020-06-03 DIAGNOSIS — E1169 Type 2 diabetes mellitus with other specified complication: Secondary | ICD-10-CM | POA: Diagnosis not present

## 2020-06-03 NOTE — Chronic Care Management (AMB) (Signed)
Chronic Care Management    Clinical Social Work Follow Up Note  06/03/2020 Name: AMANDA STEUART MRN: [id number] DOB: 1951-03-25  NIKKIA DEVOSS is a 70 y.o. year old female who is a primary care patient of Raliegh Ip, DO. The CCM team was consulted for assistance with [employer] .   Review of patient status, including review of consultants reports, other relevant assessments, and collaboration with appropriate care team members and the patient's provider was performed as part of comprehensive patient evaluation and provision of chronic care management services.    SDOH (Social Determinants of Health) assessments performed: No; risk for depression; risk for tobacco use; risk for stress; risk for financial strain; risk for physical inactivity  Flowsheet Row Chronic Care Management from 03/27/2020 in [facility]  PHQ-9 Total Score 5      GAD 7 : Generalized Anxiety Score 10/03/2019  Nervous, Anxious, on Edge 0  Control/stop worrying 0  Worry too much - different things 0  Trouble relaxing 0  Restless 0  Easily annoyed or irritable 0  Afraid - awful might happen 0  Total GAD 7 Score 0  Anxiety Difficulty Somewhat difficult    Outpatient Encounter Medications as of 06/03/2020  Medication Sig  . acetaminophen (TYLENOL) 500 MG tablet Take 1,000 mg by mouth 2 (two) times daily as needed for mild pain or moderate pain.   Marland Kitchen atenolol (TENORMIN) 25 MG tablet Take 1 tablet (25 mg total) by mouth daily.  Marland Kitchen atorvastatin (LIPITOR) 40 MG tablet Take 1 tablet (40 mg total) by mouth at bedtime.  . Coenzyme Q-10 100 MG capsule Take 2 capsules (200 mg total) by mouth daily.  Marland Kitchen ibuprofen (ADVIL) 800 MG tablet TAKE 1 TABLET BY MOUTH THREE TIMES DAILY  . metFORMIN (GLUCOPHAGE) 500 MG tablet Take 1 tablet by mouth once daily with breakfast  . triamterene-hydrochlorothiazide (MAXZIDE-25) 37.5-25 MG tablet Take 1 tablet by mouth daily.   No facility-administered  encounter medications on file as of 06/03/2020.    Goals    .  Client will talkwith me in next 30 days about mobility issues and client completion of dailily activities (pt-stated)      CARE PLAN ENTRY   Current Barriers:  . 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 Talked with clientt about CCM program services . Talked with client about RNCM support with CCM program . Talked with client about pain issues faced . Talked with client about sleeping issues of client (client said she uses CPAP to help her sleep at night) . Talked with client about ambulation needs of client  . Talked with client about her upcoming client medical appointments . Talked with client about relaxation techniques (likes to listen to music, watch old movies, does puzzles, enjoys talking via phone with sister) . Talked with client about ADLs completion . Talked with client about meal preparation for client . Talked with client about transport needs of client  . Talked with client about grief management of client . Talked with client about family support . Talked with client about DME in home (she said she has a cane, has a walker and has a 3 in 1 bedside commode)  Patient Self Care Activities:   Drives self as needed  to medical appointments Completes ADLs independently Prepares meals daily   Patient Self Care Deficits:  Marland Kitchen Mobility issues . Pain issues  Initial goal documentation     Follow Up Plan: LCSW to call client/spouse of clientin next 4 weeks to talk with her/spouse of clientabout mobility issues of client and about client completion of ADLs  [name].Pang Robers MSW, LCSW Licensed Clinical Social Worker [facility] Care [facility] [phone]

## 2020-06-03 NOTE — Patient Instructions (Addendum)
Licensed Clinical Social Worker Visit Information  Goals we discussed today:   .  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, PAD, Type 2 DM, HLD, OA, HTN  Clinical Social Work Clinical Goal(s):   LCSW to call client in next 30 days to discuss mobility issues of client and client completion of ADLs  Interventions:  Talked with clientt about CCM program services  Talked with client about RNCM support with CCM program  Talked with client about pain issues faced  Talked with client about sleeping issues of client (client said she uses CPAP to help her sleep at night)  Talked with client about ambulation needs of client   Talked with client about her upcoming client medical appointments  Talked with client about relaxation techniques (likes to listen to music, watch old movies, does puzzles, enjoys talking via phone with sister)  Talked with client about ADLs completion  Talked with client about meal preparation for client  Talked with client about transport needs of client   Talked with client about grief management of client  Talked with client about family support  Talked with client about DME in home (she said she has a cane, has a walker and has a 3 in 1 bedside commode)  Patient Self Care Activities:   Drives self as needed to medical appointments Completes ADLs independently Prepares meals daily   Patient Self Care Deficits:   Mobility issues  Pain issues  Initial goal documentation     Follow Up Plan:LCSW to call client/spouse of clientin next 4 weeks to talk with her/spouse of clientabout mobility issues of client and about client completion of ADLs  Materials Provided: No  The patient verbalized understanding of instructions provided today  and declined a print copy of patient instruction materials.   [name].Trinidad Petron MSW, LCSW Licensed Clinical Social Worker [facility] Care [facility] [phone]

## 2020-06-10 DIAGNOSIS — H52223 Regular astigmatism, bilateral: Secondary | ICD-10-CM | POA: Diagnosis not present

## 2020-06-10 DIAGNOSIS — E119 Type 2 diabetes mellitus without complications: Secondary | ICD-10-CM | POA: Diagnosis not present

## 2020-06-10 DIAGNOSIS — H25813 Combined forms of age-related cataract, bilateral: Secondary | ICD-10-CM | POA: Diagnosis not present

## 2020-06-10 DIAGNOSIS — H5203 Hypermetropia, bilateral: Secondary | ICD-10-CM | POA: Diagnosis not present

## 2020-06-10 DIAGNOSIS — Z7984 Long term (current) use of oral hypoglycemic drugs: Secondary | ICD-10-CM | POA: Diagnosis not present

## 2020-06-10 DIAGNOSIS — H524 Presbyopia: Secondary | ICD-10-CM | POA: Diagnosis not present

## 2020-06-10 LAB — HM DIABETES EYE EXAM

## 2020-06-30 DIAGNOSIS — G4733 Obstructive sleep apnea (adult) (pediatric): Secondary | ICD-10-CM | POA: Diagnosis not present

## 2020-07-03 ENCOUNTER — Telehealth: Payer: Self-pay | Admitting: *Deleted

## 2020-07-03 ENCOUNTER — Ambulatory Visit (INDEPENDENT_AMBULATORY_CARE_PROVIDER_SITE_OTHER): Payer: Medicare Other | Admitting: Licensed Clinical Social Worker

## 2020-07-03 DIAGNOSIS — E1151 Type 2 diabetes mellitus with diabetic peripheral angiopathy without gangrene: Secondary | ICD-10-CM | POA: Diagnosis not present

## 2020-07-03 DIAGNOSIS — E785 Hyperlipidemia, unspecified: Secondary | ICD-10-CM

## 2020-07-03 DIAGNOSIS — E1169 Type 2 diabetes mellitus with other specified complication: Secondary | ICD-10-CM | POA: Diagnosis not present

## 2020-07-03 DIAGNOSIS — E1159 Type 2 diabetes mellitus with other circulatory complications: Secondary | ICD-10-CM | POA: Diagnosis not present

## 2020-07-03 DIAGNOSIS — G4733 Obstructive sleep apnea (adult) (pediatric): Secondary | ICD-10-CM

## 2020-07-03 DIAGNOSIS — Z9989 Dependence on other enabling machines and devices: Secondary | ICD-10-CM

## 2020-07-03 DIAGNOSIS — Z96651 Presence of right artificial knee joint: Secondary | ICD-10-CM

## 2020-07-03 DIAGNOSIS — M1811 Unilateral primary osteoarthritis of first carpometacarpal joint, right hand: Secondary | ICD-10-CM | POA: Diagnosis not present

## 2020-07-03 DIAGNOSIS — I152 Hypertension secondary to endocrine disorders: Secondary | ICD-10-CM | POA: Diagnosis not present

## 2020-07-03 NOTE — Chronic Care Management (AMB) (Signed)
  Care Management   Note  07/03/2020 Name: [name] MRN: [id number] DOB: Mar 02, 1951  Jennifer [name] is a 70 y.o. year old female who is a primary care patient of Raliegh Ip, DO and is actively engaged with the care management team. I reached out to Leighton Roach by phone today to assist with scheduling an initial visit with the RN Case Manager  Follow up plan: Unsuccessful telephone outreach attempt made. A HIPAA compliant phone message was left for the patient providing contact information and requesting a return call.  The care management team will reach out to the patient again over the next 7 days.  If patient returns call to provider office, please advise to call Embedded Care Management Care Guide Gwenevere Ghazi  at [phone].  Gwenevere Ghazi  Care Guide, Embedded Care Coordination [facility] Management

## 2020-07-03 NOTE — Patient Instructions (Signed)
Visit Information  PATIENT GOALS: Goals Addressed            This Visit's Progress   . Find Help in My Community       Timeframe:  Short-Term Goal Priority:  Medium Start Date:   07/03/2020                          Expected End Date:   10/03/2020                    Follow Up Date : 08/02/2020   Find Help in My Community (Patient) Patient needs information about community resources to help her with mobility challenges and help her with completion of daily ADLs   Why is this important?    Knowing how and where to find help for yourself or family in your neighborhood and community is an important skill.   You will want to take some steps to learn how.     Patient Strengths: Takes medications as prescribed Attends scheduled medical appointments Self advocates as needed Has family support  Patient Deficits Some Pain issues Mobility challenges Challenges in doing ADLs o  Patient Goals:  Attend scheduled medical appointments Take time for rest and relaxation and enjoyment of hobbies Advocate for self and for understanding of her medical conditions Talk with RNCM to address nursing needs Talked with LCSW about community resources of possible benefit to client -  Follow Up Plan: LCSW to call client on 08/02/2020 to assess client needs        [name].Jennifer [name] MSW, LCSW Licensed Clinical Social Worker [facility] Care Management [phone]

## 2020-07-03 NOTE — Chronic Care Management (AMB) (Signed)
Chronic Care Management    Clinical Social Work Note  07/03/2020 Name: Jennifer [name] MRN: [id number] DOB: Dec 24, 1950  Jennifer [name] is a 70 y.o. year old female who is a primary care patient of Raliegh Ip, DO. The CCM team was consulted to assist the patient with chronic disease management and/or care coordination needs related to: [employer] .   Engaged with patient by telephone for follow up visit in response to provider referral for social work chronic care management and care coordination services.   Consent to Services:  The patient was given information about Chronic Care Management services, agreed to services, and gave verbal consent prior to initiation of services.  Please see initial visit note for detailed documentation.   Patient agreed to services and consent obtained.   Assessment: Review of patient past medical history, allergies, medications, and health status, including review of relevant consultants reports was performed today as part of a comprehensive evaluation and provision of chronic care management and care coordination services.     SDOH (Social Determinants of Health) assessments and interventions performed:    Advanced Directives Status: See Vynca application for related entries.  CCM Care Plan  Allergies  Allergen Reactions  . Hydrocodone Itching  . Oxycodone Nausea And Vomiting    Pt stated, "It makes me not want to eat"    Outpatient Encounter Medications as of 07/03/2020  Medication Sig  . acetaminophen (TYLENOL) 500 MG tablet Take 1,000 mg by mouth 2 (two) times daily as needed for mild pain or moderate pain.   Marland Kitchen atenolol (TENORMIN) 25 MG tablet Take 1 tablet (25 mg total) by mouth daily.  Marland Kitchen atorvastatin (LIPITOR) 40 MG tablet Take 1 tablet (40 mg total) by mouth at bedtime.  . Coenzyme Q-10 100 MG capsule Take 2 capsules (200 mg total) by mouth daily.  Marland Kitchen ibuprofen (ADVIL) 800 MG tablet TAKE 1 TABLET BY MOUTH THREE TIMES DAILY   . metFORMIN (GLUCOPHAGE) 500 MG tablet Take 1 tablet by mouth once daily with breakfast  . triamterene-hydrochlorothiazide (MAXZIDE-25) 37.5-25 MG tablet Take 1 tablet by mouth daily.   No facility-administered encounter medications on file as of 07/03/2020.    Patient Active Problem List   Diagnosis Date Noted  . OSA on CPAP 04/04/2019  . Peripheral arterial disease (HCC) 09/21/2017  . Type 2 diabetes, with peripheral circulatory disorder not at goal [facility]) 08/16/2017  . Chronic pain of left ankle 08/11/2017  . Bilateral carpal tunnel syndrome 08/11/2017  . Hypertension associated with diabetes (HCC) 08/11/2017  . Morbid obesity (HCC) 08/11/2017  . 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 Hyperlipidemia associated with type 2 diabetes mellitus (HCC) 08/11/2017  . Tobacco use disorder 08/11/2017  . History of adenomatous polyp of colon 07/23/2016  . S/P total knee arthroplasty 02/12/2014    Conditions to be addressed/monitored: ; Mobility issues and pain issues; client completion of ADLs daily  Care Plan : LCSW care plan  Updates made by Isaiah Blakes, LCSW since 07/03/2020 12:00 AM    Problem: Coping Skills (General Plan of Care)     Goal: Learn more about community resources to help client with mobility improvemet and help client with completion of daily ADLs   Start Date: 07/03/2020  Expected End Date: 10/03/2020  This Visit's Progress: On track  Priority: Medium  Note:   Current barriers:   . Patient in need of assistance with connecting to community resources for help as needed with mobility improvement  of client and to help client complete daily ADLs . Acknowledges deficits with meeting this unmet need . Patient is unable to independently navigate community resource options without care coordination support . Pain issues . Challenges in managing multiple medical conditions  Clinical Goals:  patient will work with SW in next 30 days  to  address concerns related to mobility challenges of client  Patient will work with SW in next 30 days to address concerns related to ADLs completion of client on daily basis  Patient will communicate with RNCM or LCSW as needed for CCM support  Clinical Interventions:  . Collaboration with Raliegh Ip, DO regarding development and update of comprehensive plan of care as evidenced by provider attestation and co-signature . Inter-disciplinary care team collaboration (see longitudinal plan of care) . Assessment of needs, barriers  of client  . Talked with client about mobility of client (has a cane to use as needed) . Talked with client about pain issues of client (talked with client about hip pain; she said it hurts often when she stands up. Patient seemed unsure about what to do about her pain needs at present. She said pain had been going on for about two months) . Talked with client about energy level of client . Talked with client about food provision of client . Talked with client about ADLs completion . Talked with client about ADTS as in home support agency . Talked with client about family support . Talked with client about upcoming eye appointment in [place], [place] with Dr.Bevis . Encouraged client to call RNCM as needed for nursing support . Provided counseling support for client . Collaborated with RNCM about client nursing needs . Recommended client call medical provider at [facility] as needed to address pain issues  Patient Strengths: Takes medications as prescribed Attends scheduled medical appointments Self advocates as needed Has family support  Patient Deficits Some Pain issues Mobility challenges Challenges in doing ADLs o  Patient Goals:  Attend scheduled medical appointments Take time for rest and relaxation and enjoyment of hobbies Advocate for self and for understanding of her medical conditions Talk with RNCM to address nursing needs Talked with LCSW about  community resources of possible benefit to client -  Follow Up Plan: LCSW to call client on 08/02/2020 to assess client needs      [name].Quitman Norberto MSW, LCSW Licensed Clinical Social Worker [facility] Care Management [phone]

## 2020-07-12 NOTE — Chronic Care Management (AMB) (Signed)
  Care Management   Note  07/12/2020 Name: [name] MRN: [id number] DOB: [birth year]  Jennifer [name] is a 70 y.o. year old female who is a primary care patient of Raliegh Ip, DO and is actively engaged with the care management team. I reached out to Leighton Roach by phone today to assist with scheduling an initial visit with the RN Case Manager.  Follow up plan: Unsuccessful telephone outreach attempt made. A HIPAA compliant phone message was left for the patient providing contact information and requesting a return call.  The care management team will reach out to the patient again over the next 7 days.  If patient returns call to provider office, please advise to call Embedded Care Management Care Guide Gwenevere Ghazi at [phone].  Gwenevere Ghazi  Care Guide, Embedded Care Coordination [facility] Management

## 2020-07-12 NOTE — Chronic Care Management (AMB) (Signed)
  Care Management   Note  07/12/2020 Name: VALERIE CONES MRN: [id number] DOB: Aug 25, 1950  ANSLIE SPADAFORA is a 70 y.o. year old female who is a primary care patient of Raliegh Ip, DO and is actively engaged with the care management team. I reached out to Leighton Roach by phone today to assist with scheduling an initial visit with the RN Case Manager  Follow up plan: Telephone appointment with care management team member scheduled for:08/01/2020  [facility] Guide, Embedded Care Coordination [facility] Management

## 2020-07-16 DIAGNOSIS — H25043 Posterior subcapsular polar age-related cataract, bilateral: Secondary | ICD-10-CM | POA: Diagnosis not present

## 2020-07-16 DIAGNOSIS — H2512 Age-related nuclear cataract, left eye: Secondary | ICD-10-CM | POA: Diagnosis not present

## 2020-07-16 DIAGNOSIS — H2513 Age-related nuclear cataract, bilateral: Secondary | ICD-10-CM | POA: Diagnosis not present

## 2020-07-16 DIAGNOSIS — H18413 Arcus senilis, bilateral: Secondary | ICD-10-CM | POA: Diagnosis not present

## 2020-07-16 DIAGNOSIS — H25013 Cortical age-related cataract, bilateral: Secondary | ICD-10-CM | POA: Diagnosis not present

## 2020-07-20 ENCOUNTER — Other Ambulatory Visit: Payer: Self-pay | Admitting: Family Medicine

## 2020-07-22 ENCOUNTER — Other Ambulatory Visit: Payer: Self-pay | Admitting: Family Medicine

## 2020-07-24 ENCOUNTER — Encounter: Payer: Self-pay | Admitting: Nurse Practitioner

## 2020-07-24 ENCOUNTER — Ambulatory Visit (INDEPENDENT_AMBULATORY_CARE_PROVIDER_SITE_OTHER): Payer: Medicare Other

## 2020-07-24 ENCOUNTER — Other Ambulatory Visit: Payer: Self-pay

## 2020-07-24 ENCOUNTER — Ambulatory Visit (INDEPENDENT_AMBULATORY_CARE_PROVIDER_SITE_OTHER): Payer: Medicare Other | Admitting: Nurse Practitioner

## 2020-07-24 VITALS — BP 111/67 | HR 72 | Temp 96.9°F | Ht 65.0 in | Wt 233.0 lb

## 2020-07-24 DIAGNOSIS — M25551 Pain in right hip: Secondary | ICD-10-CM

## 2020-07-24 DIAGNOSIS — M25559 Pain in unspecified hip: Secondary | ICD-10-CM | POA: Insufficient documentation

## 2020-07-24 MED ORDER — KETOROLAC TROMETHAMINE 60 MG/2ML IM SOLN
60.0000 mg | Freq: Once | INTRAMUSCULAR | Status: AC
  Administered 2020-07-24: 60 mg via INTRAMUSCULAR

## 2020-07-24 NOTE — Progress Notes (Signed)
Acute Office Visit  Subjective:    Patient ID: Jennifer [name], female    DOB: 1951/03/25, 70 y.o.   MRN: [id number]  Chief Complaint  Patient presents with  . Hip Pain    HPI Patient is in today for Pain  She reports recurrent right hip pain. was not an injury that may have caused the pain. The pain started two months ago and is worsening. The pain does radiate right leg. The pain is described as sharp and stabbing, is 9/10 in intensity, occurring constantly. Symptoms are worse in the: all day  Aggravating factors: sitting and walking Relieving factors: rest.  She has tried NSAIDs with little relief.   ---------------------------------------------------------------------------------------------------   Past Medical History:  Diagnosis Date  . Arthritis   . Dyspnea   . Hyperlipidemia   . Hypertension     Past Surgical History:  Procedure Laterality Date  . ABDOMINAL HYSTERECTOMY    . COLONOSCOPY N/A 11/25/2016   Procedure: COLONOSCOPY;  Surgeon: Malissa Hippo, MD;  Location: [facility] END[facility] SUITE;  Service: Endoscopy;  Laterality: N/A;  830  . KNEE ARTHROSCOPY     BIL   2010   . LUNG BIOPSY     RIGHT 30 YRS AGO   . TOTAL KNEE ARTHROPLASTY Right 02/12/2014   Procedure: TOTAL KNEE ARTHROPLASTY;  Surgeon: Dannielle Huh, MD;  Location: [facility] OR;  Service: Orthopedics;  Laterality: Right;    Family History  Problem Relation Age of Onset  . Diabetes Mother   . Stroke Mother   . Alcohol abuse Father   . Stroke Father   . Colon cancer Neg Hx     Social History   Socioeconomic History  . Marital status: Married    Spouse name: Not on file  . Number of children: 3  . Years of education: 23  . Highest education level: Not on file  Occupational History  . Occupation: CNa    Employer: [employer]  Tobacco Use  . Smoking status: Current Every Day Smoker    Packs/day: 0.50    Years: 35.00    Pack years: 17.50    Types: Cigarettes  . Smokeless tobacco: Never Used  Vaping  Use  . Vaping Use: Never used  Substance and Sexual Activity  . Alcohol use: No  . Drug use: No  . Sexual activity: Yes  Other Topics Concern  . Not on file  Social History Narrative  . Not on file   Social Determinants of Health   Financial Resource Strain: Not on file  Food Insecurity: Not on file  Transportation Needs: Not on file  Physical Activity: Not on file  Stress: Not on file  Social Connections: Not on file  Intimate Partner Violence: Not on file    Outpatient Medications Prior to Visit  Medication Sig Dispense Refill  . acetaminophen (TYLENOL) 500 MG tablet Take 1,000 mg by mouth 2 (two) times daily as needed for mild pain or moderate pain.     Marland Kitchen atenolol (TENORMIN) 25 MG tablet Take 1 tablet by mouth once daily 90 tablet 0  . atorvastatin (LIPITOR) 40 MG tablet Take 1 tablet (40 mg total) by mouth at bedtime. 90 tablet 3  . Coenzyme Q-10 100 MG capsule Take 2 capsules (200 mg total) by mouth daily. 60 capsule 6  . ibuprofen (ADVIL) 800 MG tablet TAKE 1 TABLET BY MOUTH THREE TIMES DAILY 90 tablet 2  . metFORMIN (GLUCOPHAGE) 500 MG tablet Take 1 tablet by mouth once daily  with breakfast 90 tablet 3  . triamterene-hydrochlorothiazide (MAXZIDE-25) 37.5-25 MG tablet Take 1 tablet by mouth daily. (NEEDS TO BE SEEN BEFORE NEXT REFILL) 90 tablet 0   No facility-administered medications prior to visit.    Allergies  Allergen Reactions  . Hydrocodone Itching  . Oxycodone Nausea And Vomiting    Pt stated, "It makes me not want to eat"    Review of Systems  Constitutional: Negative.   HENT: Negative.   Respiratory: Negative.   Cardiovascular: Negative.   Genitourinary: Negative.   Musculoskeletal:       Right hip pain  Skin: Negative.   All other systems reviewed and are negative.      Objective:    Physical Exam Vitals reviewed.  Constitutional:      Appearance: Normal appearance.  HENT:     Head: Normocephalic.     Nose: Nose normal.  Eyes:      Conjunctiva/sclera: Conjunctivae normal.  Cardiovascular:     Rate and Rhythm: Normal rate and regular rhythm.     Pulses: Normal pulses.     Heart sounds: Normal heart sounds.  Pulmonary:     Effort: Pulmonary effort is normal.     Breath sounds: Normal breath sounds.  Abdominal:     General: Bowel sounds are normal.  Musculoskeletal:        General: Tenderness present.  Skin:    General: Skin is warm.  Neurological:     Mental Status: She is alert and oriented to person, place, and time.     BP 111/67   Pulse 72   Temp (!) 96.9 F (36.1 C) (Temporal)   Ht 5\' 5"  (1.651 m)   Wt 233 lb (105.7 kg)   SpO2 95%   BMI 38.77 kg/m  Wt Readings from Last 3 Encounters:  07/24/20 233 lb (105.7 kg)  03/26/20 231 lb 12.8 oz (105.1 kg)  01/22/20 230 lb (104.3 kg)    Health Maintenance Due  Topic Date Due  . DEXA SCAN  Never done  . URINE MICROALBUMIN  04/29/2019  . PNA vac Low Risk Adult (2 of 2 - PPSV23) 12/06/2019  . FOOT EXAM  07/19/2020    There are no preventive care reminders to display for this patient.   No results found for: TSH Lab Results  Component Value Date   WBC 6.7 08/16/2017   HGB 13.8 08/16/2017   HCT 40.8 08/16/2017   MCV 84 08/16/2017   PLT 262 08/16/2017   Lab Results  Component Value Date   NA 135 07/20/2019   K 3.8 07/20/2019   CO2 24 07/20/2019   GLUCOSE 94 07/20/2019   BUN 19 07/20/2019   CREATININE 0.89 07/20/2019   BILITOT <0.2 07/20/2019   ALKPHOS 115 07/20/2019   AST 15 07/20/2019   ALT 12 07/20/2019   PROT 7.2 07/20/2019   ALBUMIN 4.1 07/20/2019   CALCIUM 9.2 07/20/2019   ANIONGAP 12 02/13/2014   Lab Results  Component Value Date   CHOL 151 01/24/2018   Lab Results  Component Value Date   HDL 38 (L) 01/24/2018   Lab Results  Component Value Date   LDLCALC 71 01/24/2018   Lab Results  Component Value Date   TRIG 208 (H) 01/24/2018   Lab Results  Component Value Date   CHOLHDL 4.0 01/24/2018   Lab Results   Component Value Date   HGBA1C 6.7 03/26/2020       Assessment & Plan:   Problem List Items Addressed This  Visit      Other   Hip pain - Primary    Right hip pain not well controlled.  Symptoms have been present for the last 2 months.  Patient is currently on 800 mg ibuprofen with mild to moderate relief. Toradol 60 given in clinic.  Advised patient to continue ibuprofen, completed right hip x-ray.  We will treat appropriately after reviewing x-ray.           Meds ordered this encounter  Medications  . ketorolac (TORADOL) injection 60 mg     Daryll Drown, NP

## 2020-07-24 NOTE — Assessment & Plan Note (Signed)
Right hip pain not well controlled.  Symptoms have been present for the last 2 months.  Patient is currently on 800 mg ibuprofen with mild to moderate relief. Toradol 60 given in clinic.  Advised patient to continue ibuprofen, completed right hip x-ray.  We will treat appropriately after reviewing x-ray.

## 2020-07-24 NOTE — Patient Instructions (Signed)
Hip Pain The hip is the joint between the upper legs and the lower pelvis. The bones, cartilage, tendons, and muscles of your hip joint support your body and allow you to move around. Hip pain can range from a minor ache to severe pain in one or both of your hips. The pain may be felt on the inside of the hip joint near the groin, or on the outside near the buttocks and upper thigh. You may also have swelling or stiffness in your hip area. Follow these instructions at home: Managing pain, stiffness, and swelling  If directed, put ice on the painful area. To do this: ? Put ice in a plastic bag. ? Place a towel between your skin and the bag. ? Leave the ice on for 20 minutes, 2-3 times a day.  If directed, apply heat to the affected area as often as told by your health care provider. Use the heat source that your health care provider recommends, such as a moist heat pack or a heating pad. ? Place a towel between your skin and the heat source. ? Leave the heat on for 20-30 minutes. ? Remove the heat if your skin turns bright red. This is especially important if you are unable to feel pain, heat, or cold. You may have a greater risk of getting burned.      Activity  Do exercises as told by your health care provider.  Avoid activities that cause pain. General instructions  Take over-the-counter and prescription medicines only as told by your health care provider.  Keep a journal of your symptoms. Write down: ? How often you have hip pain. ? The location of your pain. ? What the pain feels like. ? What makes the pain worse.  Sleep with a pillow between your legs on your most comfortable side.  Keep all follow-up visits as told by your health care provider. This is important.   Contact a health care provider if:  You cannot put weight on your leg.  Your pain or swelling continues or gets worse after one week.  It gets harder to walk.  You have a fever. Get help right away  if:  You fall.  You have a sudden increase in pain and swelling in your hip.  Your hip is red or swollen or very tender to touch. Summary  Hip pain can range from a minor ache to severe pain in one or both of your hips.  The pain may be felt on the inside of the hip joint near the groin, or on the outside near the buttocks and upper thigh.  Avoid activities that cause pain.  Write down how often you have hip pain, the location of the pain, what makes it worse, and what it feels like. This information is not intended to replace advice given to you by your health care provider. Make sure you discuss any questions you have with your health care provider. Document Revised: 08/22/2018 Document Reviewed: 08/22/2018 Elsevier Patient Education  2021 [employer] Inc.  

## 2020-07-26 ENCOUNTER — Ambulatory Visit (INDEPENDENT_AMBULATORY_CARE_PROVIDER_SITE_OTHER): Payer: Medicare Other

## 2020-07-26 ENCOUNTER — Telehealth: Payer: Self-pay

## 2020-07-26 VITALS — Ht 65.0 in | Wt 233.0 lb

## 2020-07-26 DIAGNOSIS — Z0000 Encounter for general adult medical examination without abnormal findings: Secondary | ICD-10-CM | POA: Diagnosis not present

## 2020-07-26 NOTE — Telephone Encounter (Signed)
Reviewed Xray result with patient per Je's notes. Pt voiced understanding and said that the shot she got helped for about 2 days but today she has been in a lot of pain. Said her pain is a 10/10. Tried to schedule a follow up with her PCP as directed but PCP is booked.   Please advise and call patient.

## 2020-07-26 NOTE — Patient Instructions (Signed)
Jennifer [name] , Thank you for taking time to come for your Medicare Wellness Visit. I appreciate your ongoing commitment to your health goals. Please review the following plan we discussed and let me know if I can assist you in the future.   Screening recommendations/referrals: Colonoscopy: Done 11/25/2016 - repeat in 10 years Mammogram: Done 05/26/2019 - Make appointment with [facility] - Let us know if you need order Bone Density: Due (You can get this done at our office same day as your appointment) Recommended yearly ophthalmology/optometry visit for glaucoma screening and checkup Recommended yearly dental visit for hygiene and checkup  Vaccinations: Influenza vaccine: Done 03/08/2020 - Repeat annually Pneumococcal vaccine: Prevnar-13 Done 12/06/2018 - Due for Pneumovax-23 Tdap vaccine: Due Shingles vaccine: Shingrix discussed. Please contact your pharmacy for coverage information.    Covid-19:Complete: 05/25/19, 06/22/19, & 03/13/2020  Advanced directives: Advance directive discussed with you today. I have provided a copy for you to complete at home and have notarized. Once this is complete please bring a copy in to our office so we can scan it into your chart.  Conditions/risks identified: Try to increase physical activity to at least 30 minutes each day; eat low carb, low fat foods, avoid extra snacking.  Next appointment: Follow up in one year for your annual wellness visit    Preventive Care 65 Years and Older, Female Preventive care refers to lifestyle choices and visits with your health care provider that can promote health and wellness. What does preventive care include?  A yearly physical exam. This is also called an annual well check.  Dental exams once or twice a year.  Routine eye exams. Ask your health care provider how often you should have your eyes checked.  Personal lifestyle choices, including:  Daily care of your teeth and gums.  Regular physical  activity.  Eating a healthy diet.  Avoiding tobacco and drug use.  Limiting alcohol use.  Practicing safe sex.  Taking low-dose aspirin every day.  Taking vitamin and mineral supplements as recommended by your health care provider. What happens during an annual well check? The services and screenings done by your health care provider during your annual well check will depend on your age, overall health, lifestyle risk factors, and family history of disease. Counseling  Your health care provider may ask you questions about your:  Alcohol use.  Tobacco use.  Drug use.  Emotional well-being.  Home and relationship well-being.  Sexual activity.  Eating habits.  History of falls.  Memory and ability to understand (cognition).  Work and work Astronomer.  Reproductive health. Screening  You may have the following tests or measurements:  Height, weight, and BMI.  Blood pressure.  Lipid and cholesterol levels. These may be checked every 5 years, or more frequently if you are over 51 years old.  Skin check.  Lung cancer screening. You may have this screening every year starting at age 66 if you have a 30-pack-year history of smoking and currently smoke or have quit within the past 15 years.  Fecal occult blood test (FOBT) of the stool. You may have this test every year starting at age 45.  Flexible sigmoidoscopy or colonoscopy. You may have a sigmoidoscopy every 5 years or a colonoscopy every 10 years starting at age 24.  Hepatitis C blood test.  Hepatitis B blood test.  Sexually transmitted disease (STD) testing.  Diabetes screening. This is done by checking your blood sugar (glucose) after you have not eaten for a while (  fasting). You may have this done every 1-3 years.  Bone density scan. This is done to screen for osteoporosis. You may have this done starting at age 33.  Mammogram. This may be done every 1-2 years. Talk to your health care provider about  how often you should have regular mammograms. Talk with your health care provider about your test results, treatment options, and if necessary, the need for more tests. Vaccines  Your health care provider may recommend certain vaccines, such as:  Influenza vaccine. This is recommended every year.  Tetanus, diphtheria, and acellular pertussis (Tdap, Td) vaccine. You may need a Td booster every 10 years.  Zoster vaccine. You may need this after age 70.  Pneumococcal 13-valent conjugate (PCV13) vaccine. One dose is recommended after age 66.  Pneumococcal polysaccharide (PPSV23) vaccine. One dose is recommended after age 80. Talk to your health care provider about which screenings and vaccines you need and how often you need them. This information is not intended to replace advice given to you by your health care provider. Make sure you discuss any questions you have with your health care provider. Document Released: 05/03/2015 Document Revised: 12/25/2015 Document Reviewed: 02/05/2015 Elsevier Interactive Patient Education  2017 [employer].  Fall Prevention in the Home Falls can cause injuries. They can happen to people of all ages. There are many things you can do to make your home safe and to help prevent falls. What can I do on the outside of my home?  Regularly fix the edges of walkways and driveways and fix any cracks.  Remove anything that might make you trip as you walk through a door, such as a raised step or threshold.  Trim any bushes or trees on the path to your home.  Use bright outdoor lighting.  Clear any walking paths of anything that might make someone trip, such as rocks or tools.  Regularly check to see if handrails are loose or broken. Make sure that both sides of any steps have handrails.  Any raised decks and porches should have guardrails on the edges.  Have any leaves, snow, or ice cleared regularly.  Use sand or salt on walking paths during  winter.  Clean up any spills in your garage right away. This includes oil or grease spills. What can I do in the bathroom?  Use night lights.  Install grab bars by the toilet and in the tub and shower. Do not use towel bars as grab bars.  Use non-skid mats or decals in the tub or shower.  If you need to sit down in the shower, use a plastic, non-slip stool.  Keep the floor dry. Clean up any water that spills on the floor as soon as it happens.  Remove soap buildup in the tub or shower regularly.  Attach bath mats securely with double-sided non-slip rug tape.  Do not have throw rugs and other things on the floor that can make you trip. What can I do in the bedroom?  Use night lights.  Make sure that you have a light by your bed that is easy to reach.  Do not use any sheets or blankets that are too big for your bed. They should not hang down onto the floor.  Have a firm chair that has side arms. You can use this for support while you get dressed.  Do not have throw rugs and other things on the floor that can make you trip. What can I do in the kitchen?  Clean up any spills right away.  Avoid walking on wet floors.  Keep items that you use a lot in easy-to-reach places.  If you need to reach something above you, use a strong step stool that has a grab bar.  Keep electrical cords out of the way.  Do not use floor polish or wax that makes floors slippery. If you must use wax, use non-skid floor wax.  Do not have throw rugs and other things on the floor that can make you trip. What can I do with my stairs?  Do not leave any items on the stairs.  Make sure that there are handrails on both sides of the stairs and use them. Fix handrails that are broken or loose. Make sure that handrails are as long as the stairways.  Check any carpeting to make sure that it is firmly attached to the stairs. Fix any carpet that is loose or worn.  Avoid having throw rugs at the top or  bottom of the stairs. If you do have throw rugs, attach them to the floor with carpet tape.  Make sure that you have a light switch at the top of the stairs and the bottom of the stairs. If you do not have them, ask someone to add them for you. What else can I do to help prevent falls?  Wear shoes that:  Do not have high heels.  Have rubber bottoms.  Are comfortable and fit you well.  Are closed at the toe. Do not wear sandals.  If you use a stepladder:  Make sure that it is fully opened. Do not climb a closed stepladder.  Make sure that both sides of the stepladder are locked into place.  Ask someone to hold it for you, if possible.  Clearly mark and make sure that you can see:  Any grab bars or handrails.  First and last steps.  Where the edge of each step is.  Use tools that help you move around (mobility aids) if they are needed. These include:  Canes.  Walkers.  Scooters.  Crutches.  Turn on the lights when you go into a dark area. Replace any light bulbs as soon as they burn out.  Set up your furniture so you have a clear path. Avoid moving your furniture around.  If any of your floors are uneven, fix them.  If there are any pets around you, be aware of where they are.  Review your medicines with your doctor. Some medicines can make you feel dizzy. This can increase your chance of falling. Ask your doctor what other things that you can do to help prevent falls. This information is not intended to replace advice given to you by your health care provider. Make sure you discuss any questions you have with your health care provider. Document Released: 01/31/2009 Document Revised: 09/12/2015 Document Reviewed: 05/11/2014 Elsevier Interactive Patient Education  2017 [employer].

## 2020-07-26 NOTE — Telephone Encounter (Signed)
Pt called - she is No better with shot that was given - a lot of pain since the OV with Je   Is there anything Oral that can be called in to help??

## 2020-07-26 NOTE — Progress Notes (Signed)
Subjective:   Jennifer [name] is a 70 y.o. female who presents for Medicare Annual (Subsequent) preventive examination.  Virtual Visit via Telephone Note  I connected with  PERLIE STENE on 07/26/20 at  3:30 PM EDT by telephone and verified that I am speaking with the correct person using two identifiers.  Location: Patient: Home Provider: [facility] Persons participating in the virtual visit: patient/Nurse Health Advisor   I discussed the limitations, risks, security and privacy concerns of performing an evaluation and management service by telephone and the availability of in person appointments. The patient expressed understanding and agreed to proceed.  Interactive audio and video telecommunications were attempted between this nurse and patient, however failed, due to patient having technical difficulties OR patient did not have access to video capability.  We continued and completed visit with audio only.  Some vital signs may be absent or patient reported.   Kaiulani Sitton E Carlester Kasparek, LPN   Review of Systems     Cardiac Risk Factors include: advanced age (>36men, >19 women);diabetes mellitus;dyslipidemia;hypertension;obesity (BMI >30kg/m2);sedentary lifestyle     Objective:    Today's Vitals   07/26/20 1430  Weight: 233 lb (105.7 kg)  Height:  (1.651 m)  PainSc: 10-Worst pain ever   Body mass index is 38.77 kg/m.  Advanced Directives 07/26/2020 04/06/2019 11/25/2016 02/12/2014 02/02/2014  Does Patient Have a Medical Advance Directive? No No No No No  Would patient like information on creating a medical advance directive? Yes (MAU/Ambulatory/Procedural Areas - Information given) Yes (MAU/Ambulatory/Procedural Areas - Information given) No - Patient declined No - patient declined information No - patient declined information    Current Medications (verified) Outpatient Encounter Medications as of 07/26/2020  Medication Sig  . acetaminophen (TYLENOL) 500 MG tablet Take 1,000 mg by  mouth 2 (two) times daily as needed for mild pain or moderate pain.   Marland Kitchen atenolol (TENORMIN) 25 MG tablet Take 1 tablet by mouth once daily  . atorvastatin (LIPITOR) 40 MG tablet Take 1 tablet (40 mg total) by mouth at bedtime.  . Coenzyme Q-10 100 MG capsule Take 2 capsules (200 mg total) by mouth daily.  Marland Kitchen ibuprofen (ADVIL) 800 MG tablet TAKE 1 TABLET BY MOUTH THREE TIMES DAILY  . metFORMIN (GLUCOPHAGE) 500 MG tablet Take 1 tablet by mouth once daily with breakfast  . Multiple Vitamin (MULTIVITAMIN) capsule Take 1 capsule by mouth daily.  Marland Kitchen triamterene-hydrochlorothiazide (MAXZIDE-25) 37.5-25 MG tablet Take 1 tablet by mouth daily. (NEEDS TO BE SEEN BEFORE NEXT REFILL)  . BESIVANCE 0.6 % SUSP Place 1 drop into the left eye 3 (three) times daily. (Patient not taking: Reported on 07/26/2020)  . prednisoLONE acetate (PRED FORTE) 1 % ophthalmic suspension Place 1 drop into the left eye 4 (four) times daily. (Patient not taking: Reported on 07/26/2020)  . PROLENSA 0.07 % SOLN Place 1 drop into the left eye at bedtime. (Patient not taking: Reported on 07/26/2020)   No facility-administered encounter medications on file as of 07/26/2020.    Allergies (verified) Hydrocodone and Oxycodone   History: Past Medical History:  Diagnosis Date  . Arthritis   . Dyspnea   . Hyperlipidemia   . Hypertension    Past Surgical History:  Procedure Laterality Date  . ABDOMINAL HYSTERECTOMY    . COLONOSCOPY N/A 11/25/2016   Procedure: COLONOSCOPY;  Surgeon: Malissa Hippo, MD;  Location: [facility];  Service: Endoscopy;  Laterality: N/A;  830  . KNEE ARTHROSCOPY     BIL  2010   . LUNG BIOPSY     RIGHT 30 YRS AGO   . TOTAL KNEE ARTHROPLASTY Right 02/12/2014   Procedure: TOTAL KNEE ARTHROPLASTY;  Surgeon: Dannielle Huh, MD;  Location: [facility] OR;  Service: Orthopedics;  Laterality: Right;   Family History  Problem Relation Age of Onset  . Diabetes Mother   . Stroke Mother   . Alcohol abuse Father   . Stroke  Father   . Colon cancer Neg Hx    Social History   Socioeconomic History  . Marital status: Married    Spouse name: Not on file  . Number of children: 3  . Years of education: 37  . Highest education level: Not on file  Occupational History  . Occupation: CNa    Employer: [employer]  Tobacco Use  . Smoking status: Current Every Day Smoker    Packs/day: 0.50    Years: 35.00    Pack years: 17.50    Types: Cigarettes  . Smokeless tobacco: Never Used  Vaping Use  . Vaping Use: Never used  Substance and Sexual Activity  . Alcohol use: No  . Drug use: No  . Sexual activity: Yes  Other Topics Concern  . Not on file  Social History Narrative   She lives with her husband   Social Determinants of Health   Financial Resource Strain: Low Risk   . Difficulty of Paying Living Expenses: Not hard at all  Food Insecurity: No Food Insecurity  . Worried About Programme researcher, broadcasting/film/video in the Last Year: Never true  . Ran Out of Food in the Last Year: Never true  Transportation Needs: No Transportation Needs  . Lack of Transportation (Medical): No  . Lack of Transportation (Non-Medical): No  Physical Activity: Inactive  . Days of Exercise per Week: 0 days  . Minutes of Exercise per Session: 0 min  Stress: No Stress Concern Present  . Feeling of Stress : Not at all  Social Connections: Socially Integrated  . Frequency of Communication with Friends and Family: More than three times a week  . Frequency of Social Gatherings with Friends and Family: More than three times a week  . Attends Religious Services: More than 4 times per year  . Active Member of Clubs or Organizations: Yes  . Attends Banker Meetings: More than 4 times per year  . Marital Status: Married    Tobacco Counseling Ready to quit: Not Answered Counseling given: Not Answered   Clinical Intake:  Pre-visit preparation completed: Yes  Pain : 0-10 Pain Score: 10-Worst pain ever Pain Type: Acute  pain Pain Location: Groin Pain Orientation: Right Pain Descriptors / Indicators: Aching,Sharp,Sore Pain Onset: More than a month ago Pain Frequency: Constant     BMI - recorded: 38.77 Nutritional Status: BMI > 30  Obese Nutritional Risks: None Diabetes: Yes CBG done?: No Did pt. bring in CBG monitor from home?: No  How often do you need to have someone help you when you read instructions, pamphlets, or other written materials from your doctor or pharmacy?: 1 - Never  .Nutrition Risk Assessment:  Has the patient had any N/V/D within the last 2 months?  No  Does the patient have any non-healing wounds?  No  Has the patient had any unintentional weight loss or weight gain?  No   Diabetes:  Is the patient diabetic?  Yes  If diabetic, was a CBG obtained today?  No  Did the patient bring in their glucometer from  home?  No  How often do you monitor your CBG's? 2-3 times per week, says it is always between 90 and 120.   Financial Strains and Diabetes Management:  Are you having any financial strains with the device, your supplies or your medication? No .  Does the patient want to be seen by Chronic Care Management for management of their diabetes?  No  Would the patient like to be referred to a Nutritionist or for Diabetic Management?  No   Diabetic Exams:  Diabetic Eye Exam: Completed 06/10/2020. She sees Dr Daphine Deutscher in [place], [place]; has appt with Dr Vonna Kotyk in [place] for cataract surgery.  Diabetic Foot Exam: Completed 07/20/2019. Pt has been advised about the importance in completing this exam. Pt is scheduled for diabetic foot exam on 09/27/2020.    Interpreter Needed?: No  Information entered by :: Daijha Leggio, LPN   Activities of Daily Living In your present state of health, do you have any difficulty performing the following activities: 07/26/2020  Hearing? N  Vision? N  Difficulty concentrating or making decisions? N  Walking or climbing stairs? N  Dressing or bathing? N   Doing errands, shopping? N  Preparing Food and eating ? N  Using the Toilet? N  In the past six months, have you accidently leaked urine? N  Do you have problems with loss of bowel control? N  Managing your Medications? N  Managing your Finances? N  Housekeeping or managing your Housekeeping? N  Some recent data might be hidden    Patient Care Team: Raliegh Ip, DO as PCP - General (Family Medicine) Runell Gess, MD as Consulting Physician (Cardiology) [name], RN as Registered Nurse Randa Spike, [name], LCSW as [facility] Care Management (Licensed Clinical Social Worker)  Indicate any recent Medical Services you may have received from other than [facility] providers in the past year (date may be approximate).     Assessment:   This is a routine wellness examination for [facility].  Hearing/Vision screen  Hearing Screening   125Hz  250Hz  500Hz  1000Hz  2000Hz  3000Hz  4000Hz  6000Hz  8000Hz   Right ear:           Left ear:           Comments: No complaints of hearing loss  Vision Screening Comments: Annual visits with Dr in [place]  Dietary issues and exercise activities discussed: Current Exercise Habits: The patient does not participate in regular exercise at present, Exercise limited by: orthopedic condition(s)  Goals    .  Chronic Disease Management Needs      CARE PLAN ENTRY (see longtitudinal plan of care for additional care plan information)  Current Barriers:  . Chronic Disease Management support, education, and care coordination needs related to HTN, DM, PAD, OSA, HLD, OA  Clinical Goals: . Over the next 10 days, patient will be contacted by a Care Guide to reschedule their Initial CCM Visit . Over the next 30 days, patient will have an Initial CCM Visit with a member of the embedded CCM team to discuss self-management of their chronic medical conditions  Interventions: . Chart reviewed in preparation for initial visit telephone  call . Collaboration with other care team members as needed . Unsuccessful outreach to patient  . A HIPPA compliant phone message was left for the patient providing contact information and requesting a return call.  . Request sent to care guides to reach out and reschedule patient's initial visit  Patient Self Care Activities: . Undetermined  Initial goal documentation     .  Client will talkwith me in next 30 days about mobility issues and client completion of dailily activities (pt-stated)      CARE PLAN ENTRY   Current Barriers:  . 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 Talked with clinet about CCM program services . Talked with client about RNCM support with CCM program . Talked with client about pain issues faced . Talked with client about sleeping issues of client (client said she uses CPAP to help her sleep at night) . Talked with client about ambulation needs of client  . Talked with client about her upcoming client medical appointments . Talked with client about relaxation techniques (likes to listen to music, watch old movies, does puzzles, enjoys talking via phone with sister) . Talked with client about ADLs completion . Talked with client about meal preparation for client . Talked with client about transport needs of client  Patient Self Care Activities:   Drives self as needed to medical appointments Completes ADLs independently Prepares meals daily   Patient Self Care Deficits:  Marland Kitchen Mobility issues . Pain issues  Initial goal documentation     .  Find Help in My Community      Timeframe:  Short-Term Goal Priority:  Medium Start Date:   07/03/2020                          Expected End Date:   10/03/2020                    Follow Up  Date : 08/02/2020   Find Help in My Community (Patient) Patient needs information about community resources to help her with mobility challenges and help her with completion of daily ADLs   Why is this important?    Knowing how and where to find help for yourself or family in your neighborhood and community is an important skill.   You will want to take some steps to learn how.     Patient Strengths: Takes medications as prescribed Attends scheduled medical appointments Self advocates as needed Has family support  Patient Deficits Some Pain issues Mobility challenges Challenges in doing ADLs o  Patient Goals:  Attend scheduled medical appointments Take time for rest and relaxation and enjoyment of hobbies Advocate for self and for understanding of her medical conditions Talk with RNCM to address nursing needs Talked with LCSW about community resources of possible benefit to client -  Follow Up Plan: LCSW to call client on 08/02/2020 to assess client needs      .  Prevent falls      Due to knee pain patient is very cautious and goal is to prevent any falls      Depression Screen PHQ 2/9 Scores 07/26/2020 07/24/2020 03/27/2020 03/26/2020 10/03/2019 07/20/2019 04/06/2019  PHQ - 2 Score 0 0 2 0 0 0 0  PHQ- 9 Score 0 0 5 - 3 0 -    Fall Risk Fall Risk  07/26/2020 07/24/2020 03/26/2020 07/20/2019 04/06/2019  Falls in the past year? 0 0 0 0 0  Number falls in past yr: 0 - - - -  Injury with Fall? 0 - - - -  Risk for fall due to : Impaired mobility;Orthopedic patient - - - -  Follow up Falls prevention discussed - - - -    FALL RISK PREVENTION PERTAINING TO THE HOME:  Any stairs in or around the home? No  If so, are there any without handrails? No  Home free of loose throw rugs in walkways, pet beds, electrical cords, etc? Yes  Adequate lighting in your home to reduce risk of falls? Yes   ASSISTIVE DEVICES UTILIZED TO PREVENT FALLS:  Life alert? No  Use of a cane, walker or w/c?  Yes  Grab bars in the bathroom? Yes  Shower chair or bench in shower? No  Elevated toilet seat or a handicapped toilet? No   TIMED UP AND GO:  Was the test performed? No . Telephonic visit.  Cognitive Function: Normal cognitive status assessed by direct observation by this Nurse Health Advisor. No abnormalities found.       6CIT Screen 04/06/2019  What Year? 0 points  What month? 0 points  What time? 0 points  Count back from 20 0 points  Months in reverse 0 points  Repeat phrase 0 points  Total Score 0    Immunizations Immunization History  Administered Date(s) Administered  . Fluad Quad(high Dose 65+) 02/07/2019, 03/08/2020  . Influenza-Unspecified 01/13/2018  . Moderna Sars-Covid-2 Vaccination 05/25/2019, 06/22/2019, 03/13/2020  . Pneumococcal Conjugate-13 12/06/2018    TDAP status: Due, Education has been provided regarding the importance of this vaccine. Advised may receive this vaccine at local pharmacy or Health Dept. Aware to provide a copy of the vaccination record if obtained from local pharmacy or Health Dept. Verbalized acceptance and understanding.  Flu Vaccine status: Up to date  Pneumococcal vaccine status: Due, Education has been provided regarding the importance of this vaccine. Advised may receive this vaccine at local pharmacy or Health Dept. Aware to provide a copy of the vaccination record if obtained from local pharmacy or Health Dept. Verbalized acceptance and understanding.  Covid-19 vaccine status: Completed vaccines  Qualifies for Shingles Vaccine? Yes   Zostavax completed No   Shingrix Completed?: No.    Education has been provided regarding the importance of this vaccine. Patient has been advised to call insurance company to determine out of pocket expense if they have not yet received this vaccine. Advised may also receive vaccine at local pharmacy or Health Dept. Verbalized acceptance and understanding.  Screening Tests Health Maintenance   Topic Date Due  . DEXA SCAN  Never done  . URINE MICROALBUMIN  04/29/2019  . PNA vac Low Risk Adult (2 of 2 - PPSV23) 12/06/2019  . FOOT EXAM  07/19/2020  . TETANUS/TDAP  03/26/2021 (Originally 03/31/1970)  . HEMOGLOBIN A1C  09/24/2020  . INFLUENZA VACCINE  11/18/2020  . MAMMOGRAM  05/16/2021  . OPHTHALMOLOGY EXAM  06/10/2021  . COLONOSCOPY (Pts [id number] Insurance coverage will need to be confirmed)  11/26/2026  . COVID-19 Vaccine  Completed  . Hepatitis C Screening  Completed  . HPV VACCINES  Aged Out    Health Maintenance  Health Maintenance Due  Topic Date Due  . DEXA SCAN  Never done  . URINE MICROALBUMIN  04/29/2019  . PNA vac Low Risk Adult (2 of 2 - PPSV23) 12/06/2019  . FOOT EXAM  07/19/2020    Colorectal cancer screening: Type of screening: Colonoscopy. Completed 11/25/2016. Repeat every 10 years  Mammogram status: Completed 05/26/2019. Repeat every year  Bone Density status: Ordered    . Pt provided with contact  info and advised to call to schedule appt.  Lung Cancer Screening: (Low Dose CT Chest recommended if Age 3-80 years, 30 pack-year currently smoking OR have quit w/in 15years.) does not qualify.   Additional Screening:  Hepatitis C Screening: does qualify; Completed 08/16/2017  Vision Screening: Recommended annual ophthalmology exams for early detection of glaucoma and other disorders of the eye. Is the patient up to date with their annual eye exam?  Yes  Who is the provider or what is the name of the office in which the patient attends annual eye exams? Dr Daphine Deutscher in [place] and Dr Vonna Kotyk, specialist in [place] If pt is not established with a provider, would they like to be referred to a provider to establish care? No .   Dental Screening: Recommended annual dental exams for proper oral hygiene  Community Resource Referral / Chronic Care Management: CRR required this visit?  No   CCM required this visit?  No      Plan:     I have personally  reviewed and noted the following in the patient's chart:   . Medical and social history . Use of alcohol, tobacco or illicit drugs  . Current medications and supplements . Functional ability and status . Nutritional status . Physical activity . Advanced directives . List of other physicians . Hospitalizations, surgeries, and ER visits in previous 12 months . Vitals . Screenings to include cognitive, depression, and falls . Referrals and appointments  In addition, I have reviewed and discussed with patient certain preventive protocols, quality metrics, and best practice recommendations. A written personalized care plan for preventive services as well as general preventive health recommendations were provided to patient.     [place] Constable, LPN   04/25/1094   Nurse Notes: Patient needs DEXA at next visit. Prefers to use [facility] for mammograms and says she doesn't need an order. Also due for pneumovax, boostrix, and shingrix vaccines.

## 2020-07-26 NOTE — Telephone Encounter (Signed)
Looks like patient is already on ibuprofen 800 mg.  I do not want to start her on tramadol because I am not her PCP.  Please have patient follow-up with PCP ASAP for continued evaluation and reassessment

## 2020-07-27 ENCOUNTER — Other Ambulatory Visit: Payer: Self-pay | Admitting: Family Medicine

## 2020-07-27 DIAGNOSIS — M1990 Unspecified osteoarthritis, unspecified site: Secondary | ICD-10-CM

## 2020-07-29 NOTE — Telephone Encounter (Signed)
Pt aware of wed appt given = will call back if can not come in

## 2020-07-31 ENCOUNTER — Ambulatory Visit (INDEPENDENT_AMBULATORY_CARE_PROVIDER_SITE_OTHER): Payer: Medicare Other | Admitting: Family Medicine

## 2020-07-31 ENCOUNTER — Encounter: Payer: Self-pay | Admitting: Family Medicine

## 2020-07-31 ENCOUNTER — Other Ambulatory Visit: Payer: Self-pay

## 2020-07-31 VITALS — BP 109/63 | HR 67 | Temp 97.8°F | Ht 65.0 in | Wt 232.0 lb

## 2020-07-31 DIAGNOSIS — G4733 Obstructive sleep apnea (adult) (pediatric): Secondary | ICD-10-CM | POA: Diagnosis not present

## 2020-07-31 DIAGNOSIS — M1611 Unilateral primary osteoarthritis, right hip: Secondary | ICD-10-CM | POA: Diagnosis not present

## 2020-07-31 MED ORDER — DICLOFENAC SODIUM 75 MG PO TBEC: 75.0000 mg | DELAYED_RELEASE_TABLET | Freq: Two times a day (BID) | ORAL | 0 refills | Status: DC | PRN

## 2020-07-31 MED ORDER — METHYLPREDNISOLONE ACETATE 40 MG/ML IJ SUSP
80.0000 mg | Freq: Once | INTRAMUSCULAR | Status: AC
  Administered 2020-07-31: 80 mg via INTRAMUSCULAR

## 2020-07-31 MED ORDER — HYDROCODONE-ACETAMINOPHEN 5-325 MG PO TABS: 1.0000 | ORAL_TABLET | Freq: Four times a day (QID) | ORAL | 0 refills | Status: DC | PRN

## 2020-07-31 NOTE — Progress Notes (Signed)
Subjective: CC: Degenerative hip disease PCP: Raliegh Ip, DO VVO:HYWVP W Dudek is a 70 y.o. female presenting to clinic today for:  1. Degenerative hip Patient was seen 07/24/20 for right hip pain that had been ongoing for 2 months prior.  Symptoms were refractory to motrin.  She was given a toradol shot, xrays were obtained.  Hip was found to have moderate degenerative changes.  She was instructed to continue current NSAID regimen and follow up with me for further management. She presents today and notes that symptoms are severe and have worsened since her last visit 1 week ago.  She continues to take Motrin 800 mg with last dose this morning.  This is not helped at all.  Her gait is becoming more antalgic.  She works 3 hours/day but thinks that she will take off the next few days.  ROS: Per HPI  Allergies  Allergen Reactions  . Hydrocodone Itching  . Oxycodone Nausea And Vomiting    Pt stated, "It makes me not want to eat"   Past Medical History:  Diagnosis Date  . Arthritis   . Dyspnea   . Hyperlipidemia   . Hypertension     Current Outpatient Medications:  .  acetaminophen (TYLENOL) 500 MG tablet, Take 1,000 mg by mouth 2 (two) times daily as needed for mild pain or moderate pain. , Disp: , Rfl:  .  atenolol (TENORMIN) 25 MG tablet, Take 1 tablet by mouth once daily, Disp: 90 tablet, Rfl: 0 .  atorvastatin (LIPITOR) 40 MG tablet, Take 1 tablet (40 mg total) by mouth at bedtime., Disp: 90 tablet, Rfl: 3 .  BESIVANCE 0.6 % SUSP, Place 1 drop into the left eye 3 (three) times daily. (Patient not taking: Reported on 07/26/2020), Disp: , Rfl:  .  Coenzyme Q-10 100 MG capsule, Take 2 capsules (200 mg total) by mouth daily., Disp: 60 capsule, Rfl: 6 .  ibuprofen (ADVIL) 800 MG tablet, TAKE 1 TABLET BY MOUTH THREE TIMES DAILY, Disp: 90 tablet, Rfl: 0 .  metFORMIN (GLUCOPHAGE) 500 MG tablet, Take 1 tablet by mouth once daily with breakfast, Disp: 90 tablet, Rfl: 3 .  Multiple  Vitamin (MULTIVITAMIN) capsule, Take 1 capsule by mouth daily., Disp: , Rfl:  .  prednisoLONE acetate (PRED FORTE) 1 % ophthalmic suspension, Place 1 drop into the left eye 4 (four) times daily. (Patient not taking: Reported on 07/26/2020), Disp: , Rfl:  .  PROLENSA 0.07 % SOLN, Place 1 drop into the left eye at bedtime. (Patient not taking: Reported on 07/26/2020), Disp: , Rfl:  .  triamterene-hydrochlorothiazide (MAXZIDE-25) 37.5-25 MG tablet, Take 1 tablet by mouth daily. (NEEDS TO BE SEEN BEFORE NEXT REFILL), Disp: 90 tablet, Rfl: 0 Social History   Socioeconomic History  . Marital status: Married    Spouse name: Not on file  . Number of children: 3  . Years of education: 28  . Highest education level: Not on file  Occupational History  . Occupation: CNa    Employer: [employer]  Tobacco Use  . Smoking status: Current Every Day Smoker    Packs/day: 0.50    Years: 35.00    Pack years: 17.50    Types: Cigarettes  . Smokeless tobacco: Never Used  Vaping Use  . Vaping Use: Never used  Substance and Sexual Activity  . Alcohol use: No  . Drug use: No  . Sexual activity: Yes  Other Topics Concern  . Not on file  Social History Narrative  She lives with her husband   Social Determinants of Corporate investment banker Strain: Low Risk   . Difficulty of Paying Living Expenses: Not hard at all  Food Insecurity: No Food Insecurity  . Worried About Programme researcher, broadcasting/film/video in the Last Year: Never true  . Ran Out of Food in the Last Year: Never true  Transportation Needs: No Transportation Needs  . Lack of Transportation (Medical): No  . Lack of Transportation (Non-Medical): No  Physical Activity: Inactive  . Days of Exercise per Week: 0 days  . Minutes of Exercise per Session: 0 min  Stress: No Stress Concern Present  . Feeling of Stress : Not at all  Social Connections: Socially Integrated  . Frequency of Communication with Friends and Family: More than three times a week  .  Frequency of Social Gatherings with Friends and Family: More than three times a week  . Attends Religious Services: More than 4 times per year  . Active Member of Clubs or Organizations: Yes  . Attends Banker Meetings: More than 4 times per year  . Marital Status: Married  Catering manager Violence: Not At Risk  . Fear of Current or Ex-Partner: No  . Emotionally Abused: No  . Physically Abused: No  . Sexually Abused: No   Family History  Problem Relation Age of Onset  . Diabetes Mother   . Stroke Mother   . Alcohol abuse Father   . Stroke Father   . Colon cancer Neg Hx     Objective: Office vital signs reviewed. BP 109/63   Pulse 67   Temp 97.8 F (36.6 C)   Ht 5\' 5"  (1.651 m)   Wt 232 lb (105.2 kg)   SpO2 98%   BMI 38.61 kg/m   Physical Examination:  General: Awake, alert, very uncomfortable appearing Extremities: warm, well perfused, No edema, cyanosis or clubbing; +2 pulses bilaterally MSK: antalgic gait and hunched station; she is ambulating independently  Right hip: She is tender to palpation throughout the entire right hip.  She has pain with internal and external rotation with external rotation of hip being more pronounced pain.  DG Hip Unilat W OR W/O Pelvis 2-3 Views Right  Result Date: 07/24/2020 CLINICAL DATA:  Chronic right hip pain EXAM: DG HIP (WITH OR WITHOUT PELVIS) 2-3V RIGHT COMPARISON:  None. FINDINGS: No fracture or dislocation is seen. Moderate degenerative changes of the right hip. Left hip joint space is preserved. Visualized bony pelvis appears intact. Degenerative changes of the lower lumbar spine. IMPRESSION: Moderate degenerative changes of the right hip. Electronically Signed   By: 09/23/2020 M.D.   On: 07/24/2020 16:52    Assessment/ Plan: 70 y.o. female   Primary osteoarthritis of right hip - Plan: diclofenac (VOLTAREN) 75 MG EC tablet, HYDROcodone-acetaminophen (NORCO) 5-325 MG tablet, Ambulatory referral to Orthopedic  Surgery, MR HIP RIGHT WO CONTRAST  Her x-ray was reviewed and it did show moderate degenerative changes.  She is quite tearful during today's exam and her mobility is quite limited secondary to the pain.  For this reason I have given her a corticosteroid injection and advanced her pain regimen to Voltaren and Norco (she tolerated before after foot surgery).  Discussed stool softener.  Avoid other NSAIDs while taking Voltaren.  Hydrate.  I have ordered an MRI of her hip as well as place referral to orthopedic surgery.  We discussed physical therapy was an option but at this point she is in such severe  discomfort that I am unsure she would actually be able to go through with physical therapy.  We discussed red flag signs and symptoms and reasons for reevaluation.  She voiced good understanding will follow prn.  The Narcotic Database has been reviewed.  There were no red flags.    No orders of the defined types were placed in this encounter.  No orders of the defined types were placed in this encounter.    Raliegh Ip, DO [facility] [phone]

## 2020-07-31 NOTE — Patient Instructions (Addendum)
MRI of your hip is ordered I have referred you to the orthopedist. Diclofenac to REPLACE the Motrin Vicodin sent for severe pain. Caution sedation. TAKE WITH A STOOL SOFTENER  You have prescribed a nonsteroidal anti-inflammatory drug (NSAID) today. This will help with your pain and inflammation. Please do not take any other NSAIDs (ibuprofen/Motrin/Advil, naproxen/Aleve, meloxicam/Mobic, Voltaren/diclofenac). Please make sure to eat a meal when taking this medication.   Caution:  If you have a history of acid reflux/indigestion, I recommend that you take an antacid (such as Prilosec, Prevacid) daily while on the NSAID.  If you have a history of bleeding disorder, gastric ulcer, are on a blood thinner (like warfarin/Coumadin, Xarelto, Eliquis, etc) please do not take NSAID.  If you have ever had a heart attack, you should not take NSAIDs.   Hip Pain The hip is the joint between the upper legs and the lower pelvis. The bones, cartilage, tendons, and muscles of your hip joint support your body and allow you to move around. Hip pain can range from a minor ache to severe pain in one or both of your hips. The pain may be felt on the inside of the hip joint near the groin, or on the outside near the buttocks and upper thigh. You may also have swelling or stiffness in your hip area. Follow these instructions at home: Managing pain, stiffness, and swelling  If directed, put ice on the painful area. To do this: ? Put ice in a plastic bag. ? Place a towel between your skin and the bag. ? Leave the ice on for 20 minutes, 2-3 times a day.  If directed, apply heat to the affected area as often as told by your health care provider. Use the heat source that your health care provider recommends, such as a moist heat pack or a heating pad. ? Place a towel between your skin and the heat source. ? Leave the heat on for 20-30 minutes. ? Remove the heat if your skin turns bright red. This is especially  important if you are unable to feel pain, heat, or cold. You may have a greater risk of getting burned.      Activity  Do exercises as told by your health care provider.  Avoid activities that cause pain. General instructions  Take over-the-counter and prescription medicines only as told by your health care provider.  Keep a journal of your symptoms. Write down: ? How often you have hip pain. ? The location of your pain. ? What the pain feels like. ? What makes the pain worse.  Sleep with a pillow between your legs on your most comfortable side.  Keep all follow-up visits as told by your health care provider. This is important.   Contact a health care provider if:  You cannot put weight on your leg.  Your pain or swelling continues or gets worse after one week.  It gets harder to walk.  You have a fever. Get help right away if:  You fall.  You have a sudden increase in pain and swelling in your hip.  Your hip is red or swollen or very tender to touch. Summary  Hip pain can range from a minor ache to severe pain in one or both of your hips.  The pain may be felt on the inside of the hip joint near the groin, or on the outside near the buttocks and upper thigh.  Avoid activities that cause pain.  Write down how often you  have hip pain, the location of the pain, what makes it worse, and what it feels like. This information is not intended to replace advice given to you by your health care provider. Make sure you discuss any questions you have with your health care provider. Document Revised: 08/22/2018 Document Reviewed: 08/22/2018 Elsevier Patient Education  2021 [employer].

## 2020-07-31 NOTE — Addendum Note (Signed)
Addended by: Fara Olden on: 07/31/2020 02:55 PM   Modules accepted: Orders

## 2020-08-01 ENCOUNTER — Ambulatory Visit (INDEPENDENT_AMBULATORY_CARE_PROVIDER_SITE_OTHER): Payer: Medicare Other | Admitting: *Deleted

## 2020-08-01 ENCOUNTER — Telehealth: Payer: Self-pay | Admitting: Family Medicine

## 2020-08-01 DIAGNOSIS — E1169 Type 2 diabetes mellitus with other specified complication: Secondary | ICD-10-CM

## 2020-08-01 DIAGNOSIS — E785 Hyperlipidemia, unspecified: Secondary | ICD-10-CM

## 2020-08-01 DIAGNOSIS — E1151 Type 2 diabetes mellitus with diabetic peripheral angiopathy without gangrene: Secondary | ICD-10-CM

## 2020-08-01 NOTE — Chronic Care Management (AMB) (Signed)
Chronic Care Management   CCM RN Visit Note  08/01/2020 Name: Jennifer [name] MRN: [id number] DOB: April 06, 1951  Subjective: Jennifer [name] is a 70 y.o. year old female who is a primary care patient of Raliegh Ip, DO. The care management team was consulted for assistance with disease management and care coordination needs.    Engaged with patient by telephone for follow up visit in response to provider referral for case management and/or care coordination services.   Consent to Services:  The patient was given information about Chronic Care Management services, agreed to services, and gave verbal consent prior to initiation of services.  Please see initial visit note for detailed documentation.   Patient agreed to services and verbal consent obtained.   Assessment: Review of patient past medical history, allergies, medications, health status, including review of consultants reports, laboratory and other test data, was performed as part of comprehensive evaluation and provision of chronic care management services.   SDOH (Social Determinants of Health) assessments and interventions performed:    CCM Care Plan  Allergies  Allergen Reactions  . Hydrocodone Itching  . Oxycodone Nausea And Vomiting    Pt stated, "It makes me not want to eat"    Outpatient Encounter Medications as of 08/01/2020  Medication Sig  . acetaminophen (TYLENOL) 500 MG tablet Take 1,000 mg by mouth 2 (two) times daily as needed for mild pain or moderate pain.   Marland Kitchen atenolol (TENORMIN) 25 MG tablet Take 1 tablet by mouth once daily  . atorvastatin (LIPITOR) 40 MG tablet Take 1 tablet (40 mg total) by mouth at bedtime.  Marland Kitchen BESIVANCE 0.6 % SUSP Place 1 drop into the left eye 3 (three) times daily. (Patient not taking: No sig reported)  . Coenzyme Q-10 100 MG capsule Take 2 capsules (200 mg total) by mouth daily.  . diclofenac (VOLTAREN) 75 MG EC tablet Take 1 tablet (75 mg total) by mouth 2 (two) times daily  as needed for moderate pain.  Marland Kitchen HYDROcodone-acetaminophen (NORCO) 5-325 MG tablet Take 1 tablet by mouth every 6 (six) hours as needed for severe pain.  . metFORMIN (GLUCOPHAGE) 500 MG tablet Take 1 tablet by mouth once daily with breakfast  . Multiple Vitamin (MULTIVITAMIN) capsule Take 1 capsule by mouth daily.  . prednisoLONE acetate (PRED FORTE) 1 % ophthalmic suspension Place 1 drop into the left eye 4 (four) times daily. (Patient not taking: No sig reported)  . PROLENSA 0.07 % SOLN Place 1 drop into the left eye at bedtime. (Patient not taking: No sig reported)  . triamterene-hydrochlorothiazide (MAXZIDE-25) 37.5-25 MG tablet Take 1 tablet by mouth daily. (NEEDS TO BE SEEN BEFORE NEXT REFILL) (Patient not taking: Reported on 07/31/2020)   No facility-administered encounter medications on file as of 08/01/2020.    Patient Active Problem List   Diagnosis Date Noted  . Hip pain 07/24/2020  . OSA on CPAP 04/04/2019  . Peripheral arterial disease (HCC) 09/21/2017  . Type 2 diabetes, with peripheral circulatory disorder not at goal [facility]) 08/16/2017  . Chronic pain of left ankle 08/11/2017  . Bilateral carpal tunnel syndrome 08/11/2017  . Hypertension associated with diabetes (HCC) 08/11/2017  . Morbid obesity (HCC) 08/11/2017  . 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 Hyperlipidemia associated with type 2 diabetes mellitus (HCC) 08/11/2017  . Tobacco use disorder 08/11/2017  . History of adenomatous polyp of colon 07/23/2016  . S/P total knee arthroplasty 02/12/2014    Conditions to be addressed/monitored:HTN, HLD  and DMII  Care Plan : RNCM: Diabetes Type 2 (Adult)  Updates made by [name], RN since 08/01/2020 12:00 AM    Problem: Glycemic Management (Diabetes, Type 2)   Priority: Medium    Long-Range Goal: Glycemic Management Optimized   Start Date: 08/01/2020  This Visit's Progress: On track  Priority: Medium  Note:   Objective:  Lab Results   Component Value Date   HGBA1C 6.7 03/26/2020 .   Lab Results  Component Value Date   CREATININE 0.89 07/20/2019   CREATININE 0.75 08/16/2017   CREATININE 0.57 02/13/2014   Current Barriers:  . Limited physical activity due to joint pain . Chronic Disease Management support and education needs related to diabetes in a patient with HTN & HLD  Case Manager Clinical Goal(s):  . patient will demonstrate improved adherence to prescribed treatment plan for diabetes self care/management as evidenced by: daily monitoring and recording of CBG, adherence to ADA/ carb modified diet, exercise 3-5 days/week  Interventions:  . Collaboration with Raliegh Ip, DO regarding development and update of comprehensive plan of care as evidenced by provider attestation and co-signature . Inter-disciplinary care team collaboration (see longitudinal plan of care) . Reviewed medications with patient and discussed importance of medication adherence . Discussed plans with patient for ongoing care management follow up and provided patient with direct contact information for care management team . Reviewed scheduled/upcoming provider appointments including: 09/27/20 with Dr Nadine Counts . Advised patient, providing education and rationale, to check cbg daily and record, calling [phone] for findings outside established parameters.   . Review of patient status, including review of consultants reports, relevant laboratory and other test results, and medications completed.  Self-Care Activities Self administers oral medications as prescribed Attends all scheduled provider appointments Checks blood sugars as prescribed and utilize hyper and hypoglycemia protocol as needed  Patient Goals: . check blood sugar at prescribed times . check blood sugar if I feel it is too high or too low . enter blood sugar readings and medication or insulin into daily log . take the blood sugar log to all doctor visits . take  the blood sugar meter to all doctor visits . limit fast food meals to no more than 1 per week . read food labels for fat, fiber, carbohydrates and portion size . check feet daily for cuts, sores or redness . wash and dry feet carefully every day . wear comfortable, cotton socks . wear comfortable, well-fitting shoes    Care Plan : RNMC: Hyperlipidemia  Updates made by [name], RN since 08/01/2020 12:00 AM    Problem: Hyperlipidemia   Priority: Medium    Long-Range Goal: Manage My Cholesterol   Start Date: 08/01/2020  This Visit's Progress: Not on track  Priority: Medium  Note:   Objective: Lab Results  Component Value Date   CHOL 151 01/24/2018   HDL 38 (L) 01/24/2018   LDLCALC 71 01/24/2018   TRIG 208 (H) 01/24/2018   CHOLHDL 4.0 01/24/2018   Current Barriers:   Ineffective Self Health Maintenance   Limited physical activity level due to left knee and right pain  Clinical Goal(s):  Marland Kitchen Collaboration with Raliegh Ip, DO regarding development and update of comprehensive plan of care as evidenced by provider attestation and co-signature . Inter-disciplinary care team collaboration (see longitudinal plan of care)  patient will work with care management team to address care coordination and chronic disease management needs related to Disease Management-hyperlipidemia    Interventions:  Evaluation of current treatment plan related to HLD self-management and patient's adherence to plan as established by provider.  Collaboration with Raliegh Ip, DO regarding development and update of comprehensive plan of care as evidenced by provider attestation       and co-signature  Inter-disciplinary care team collaboration (see longitudinal plan of care)  Discussed plans with patient for ongoing care management follow up and provided patient with direct contact information for care management team  Chart reviewed includuing recent office visits and most recent  lab results  Last lipid panel was 07/2017  Reviewed and discussed upcoming PCP appt on 09/27/20  Appt note added that lipid panel is due  Discussed current limitations on physical activity due to left knee and right hip pain  Ortho evaluation and MRI pending scheduling  Encourged increased activity level as tolerated with an ultimate goal of 150 minutes a week  DASH diet recommended  Provided with CCM team contact information and encouraged to reach out as needed   Self Care Activities:  . Self administers medications as prescribed . Attends all scheduled provider appointments . Performs ADL's independently . Performs IADL's independently  Patient Goals: . change to whole grain breads, cereal, pasta . fill half of plate with vegetables . manage portion size . read food labels for fat, fiber, carbohydrates and portion size . reduce red meat to 2 to 3 times a week . Keep appointment with PCP for 09/27/20 . Take medication as prescribed' . Increase physical acitivty level as tolerated with a goal of 150 minutes a week    Follow Up Plan:  . Telephone follow up appointment with care management team member scheduled for: 08/27/20 with RNCM . We have been unable to make contact with the patient for follow up. The care management team is available to follow up with the patient after provider conversation with the patient regarding recommendation for care management engagement and subsequent re-referral to the care management team.  . Next PCP appointment scheduled for: 09/27/20 with Dr Nadine Counts  Demetrios Loll, BSN, RN-BC Embedded Chronic Care Manager [facility] / [facility] Care Management Direct Dial: [phone]

## 2020-08-01 NOTE — Patient Instructions (Signed)
Visit Information  PATIENT GOALS: Goals Addressed            This Visit's Progress   . Manage My Blood Sugar & Monitor/Prevent DM Complications       Timeframe:  Long-Range Goal Priority:  Medium Start Date: 08/01/20                            Expected End Date: 08/01/21                      Follow Up Date 08/27/20    . check blood sugar at prescribed times . check blood sugar if I feel it is too high or too low . enter blood sugar readings and medication or insulin into daily log . take the blood sugar log to all doctor visits . take the blood sugar meter to all doctor visits . limit fast food meals to no more than 1 per week . read food labels for fat, fiber, carbohydrates and portion size . check feet daily for cuts, sores or redness . wash and dry feet carefully every day . wear comfortable, cotton socks . wear comfortable, well-fitting shoes    Why is this important?    Checking your blood sugar at home helps to keep it from getting very high or very low.   Writing the results in a diary or log helps the doctor know how to care for you.   Your blood sugar log should have the time, date and the results.   Also, write down the amount of insulin or other medicine that you take.   Other information, like what you ate, exercise done and how you were feeling, will also be helpful.     Notes:     Marland Kitchen Manage My Cholesterol       Timeframe:  Long-Range Goal Priority:  Medium Start Date:  08/01/20                           Expected End Date:  08/01/21                      Follow Up Date  08/27/20   . change to whole grain breads, cereal, pasta . at smaller or less servings of red meat . fill half the plate with nonstarchy vegetables . increase the amount of fiber in food . Manage portion sizes . read food labels for fat and fiber  . Increase activity level as tolerated with a goal of 150 minutes a week . Take medications as prescribed . Keep appointment with Dr  Nadine Counts on 09/27/20 and have lipid panel done at that visit   Why is this important?   Changing cholesterol starts with eating heart-healthy foods.  Other steps may be to increase your activity and to quit if you smoke.    Notes:        Patient verbalizes understanding of instructions provided today and agrees to view in MyChart.   Follow Up Plan:  . Telephone follow up appointment with care management team member scheduled for: 08/27/20 with RNCM . We have been unable to make contact with the patient for follow up. The care management team is available to follow up with the patient after provider conversation with the patient regarding recommendation for care management engagement and subsequent re-referral to the care management team.  . Next PCP  appointment scheduled for: 09/27/20 with Dr Nadine Counts  Demetrios Loll, BSN, RN-BC Embedded Chronic Care Manager [facility] / [facility] Care Management Direct Dial: [phone]

## 2020-08-02 ENCOUNTER — Telehealth: Payer: Medicare Other

## 2020-08-06 ENCOUNTER — Ambulatory Visit: Payer: Medicare Other | Admitting: Licensed Clinical Social Worker

## 2020-08-06 DIAGNOSIS — M1811 Unilateral primary osteoarthritis of first carpometacarpal joint, right hand: Secondary | ICD-10-CM | POA: Diagnosis not present

## 2020-08-06 DIAGNOSIS — E785 Hyperlipidemia, unspecified: Secondary | ICD-10-CM

## 2020-08-06 DIAGNOSIS — E1169 Type 2 diabetes mellitus with other specified complication: Secondary | ICD-10-CM | POA: Diagnosis not present

## 2020-08-06 DIAGNOSIS — Z96651 Presence of right artificial knee joint: Secondary | ICD-10-CM

## 2020-08-06 DIAGNOSIS — Z9989 Dependence on other enabling machines and devices: Secondary | ICD-10-CM

## 2020-08-06 DIAGNOSIS — I152 Hypertension secondary to endocrine disorders: Secondary | ICD-10-CM

## 2020-08-06 DIAGNOSIS — E1159 Type 2 diabetes mellitus with other circulatory complications: Secondary | ICD-10-CM | POA: Diagnosis not present

## 2020-08-06 DIAGNOSIS — E1151 Type 2 diabetes mellitus with diabetic peripheral angiopathy without gangrene: Secondary | ICD-10-CM

## 2020-08-06 DIAGNOSIS — I739 Peripheral vascular disease, unspecified: Secondary | ICD-10-CM

## 2020-08-06 DIAGNOSIS — G4733 Obstructive sleep apnea (adult) (pediatric): Secondary | ICD-10-CM

## 2020-08-06 NOTE — Chronic Care Management (AMB) (Signed)
Chronic Care Management    Clinical Social Work Note  08/06/2020 Name: Jennifer [name] MRN: [id number] DOB: 09-Aug-1950  Jennifer [name] is a 70 y.o. year old female who is a primary care patient of Jennifer Ip, DO. The CCM team was consulted to assist the patient with chronic disease management and/or care coordination needs related to: [facility] .   Engaged with patient by telephone for follow up visit in response to provider referral for social work chronic care management and care coordination services.   Consent to Services:  The patient was given information about Chronic Care Management services, agreed to services, and gave verbal consent prior to initiation of services.  Please see initial visit note for detailed documentation.   Patient agreed to services and consent obtained.   Assessment: Review of patient past medical history, allergies, medications, and health status, including review of relevant consultants reports was performed today as part of a comprehensive evaluation and provision of chronic care management and care coordination services.     SDOH (Social Determinants of Health) assessments and interventions performed:  SDOH Interventions   Flowsheet Row Most Recent Value  SDOH Interventions   Depression Interventions/Treatment  --  [informed client of LCSW support and RNCM support]       Advanced Directives Status: See Vynca application for related entries.  CCM Care Plan  Allergies  Allergen Reactions  . Hydrocodone Itching  . Oxycodone Nausea And Vomiting    Pt stated, "It makes me not want to eat"    Outpatient Encounter Medications as of 08/06/2020  Medication Sig  . acetaminophen (TYLENOL) 500 MG tablet Take 1,000 mg by mouth 2 (two) times daily as needed for mild pain or moderate pain.   Marland Kitchen atenolol (TENORMIN) 25 MG tablet Take 1 tablet by mouth once daily  . atorvastatin (LIPITOR) 40 MG tablet Take 1 tablet (40 mg total) by mouth at  bedtime.  Marland Kitchen BESIVANCE 0.6 % SUSP Place 1 drop into the left eye 3 (three) times daily. (Patient not taking: No sig reported)  . Coenzyme Q-10 100 MG capsule Take 2 capsules (200 mg total) by mouth daily.  . diclofenac (VOLTAREN) 75 MG EC tablet Take 1 tablet (75 mg total) by mouth 2 (two) times daily as needed for moderate pain.  Marland Kitchen HYDROcodone-acetaminophen (NORCO) 5-325 MG tablet Take 1 tablet by mouth every 6 (six) hours as needed for severe pain.  . metFORMIN (GLUCOPHAGE) 500 MG tablet Take 1 tablet by mouth once daily with breakfast  . Multiple Vitamin (MULTIVITAMIN) capsule Take 1 capsule by mouth daily.  . prednisoLONE acetate (PRED FORTE) 1 % ophthalmic suspension Place 1 drop into the left eye 4 (four) times daily. (Patient not taking: No sig reported)  . PROLENSA 0.07 % SOLN Place 1 drop into the left eye at bedtime. (Patient not taking: No sig reported)  . triamterene-hydrochlorothiazide (MAXZIDE-25) 37.5-25 MG tablet Take 1 tablet by mouth daily. (NEEDS TO BE SEEN BEFORE NEXT REFILL) (Patient not taking: Reported on 07/31/2020)   No facility-administered encounter medications on file as of 08/06/2020.    Patient Active Problem List   Diagnosis Date Noted  . Hip pain 07/24/2020  . OSA on CPAP 04/04/2019  . Peripheral arterial disease (HCC) 09/21/2017  . Type 2 diabetes, with peripheral circulatory disorder not at goal [name]) 08/16/2017  . Chronic pain of left ankle 08/11/2017  . Bilateral carpal tunnel syndrome 08/11/2017  . Hypertension associated with diabetes (HCC) 08/11/2017  . Morbid obesity (  HCC) 08/11/2017  . 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 Hyperlipidemia associated with type 2 diabetes mellitus (HCC) 08/11/2017  . Tobacco use disorder 08/11/2017  . History of adenomatous polyp of colon 07/23/2016  . S/P total knee arthroplasty 02/12/2014    Conditions to be addressed/monitored: Monitor client mobility issues and monitor client pain issues  faced   Care Plan : LCSW care plan  Updates made by Jennifer Blakes, LCSW since 08/06/2020 12:00 AM    Problem: Coping Skills (General Plan of Care)     Goal: Learn more about community resources to help client with mobility improvemet and help client with completion of daily ADLs   Start Date: 07/03/2020  Expected End Date: 10/03/2020  This Visit's Progress: On track  Recent Progress: On track  Priority: Medium  Note:   Current barriers:   . Patient in need of assistance with connecting to community resources for help as needed with mobility improvement of client and to help client complete daily ADLs . Patient is unable to independently navigate community resource options without care coordination support . Pain issues . Challenges in managing multiple medical conditions  Clinical Goals:  patient will work with SW in next 30 days  to address concerns related to mobility challenges of client  Patient will work with SW in next 30 days to address concerns related to ADLs completion of client on daily basis  Patient will communicate with RNCM or LCSW as needed for CCM support in next 30 days  Clinical Interventions:  . Collaboration with Jennifer Ip, DO regarding development and update of comprehensive plan of care as evidenced by provider attestation and co-signature . Assessment of needs, barriers  of client  . Talked with client about mobility of client (has a cane to use as needed) . Talked with client about pain issues of client  . Talked with client about her upcoming appointment with orthopedic doctor in May of 2022 . Talked with client about difficulty in standing (pain issues) . Talked with client about energy level of client . Talked with client about food provision of client . Talked with client about ADLs completion . Talked with client about family support (spouse is supportive, daughter is supportive) . Talked with client about upcoming eye surgery in  [place], [place] with Jennifer [name] (eye surgery scheduled for 08/16/2020) . Encouraged client to call RNCM as needed for nursing support  Patient Strengths: Takes medications as prescribed Attends scheduled medical appointments Self advocates as needed Has family support  Patient Deficits Some Pain issues Mobility challenges Challenges in doing ADLs  Patient Goals:  Attend scheduled medical appointments Take time for rest and relaxation and enjoyment of hobbies Advocate for self and for understanding of her medical conditions Talk with RNCM to address nursing needs Talk with LCSW about community resources of possible benefit to client -  Follow Up Plan: LCSW to call client on 09/11/20 to assess client needs      [name].Reneshia Zuccaro MSW, LCSW Licensed Clinical Social Worker [facility] Care Management [phone]

## 2020-08-06 NOTE — Patient Instructions (Signed)
Visit Information  PATIENT GOALS: Goals Addressed            This Visit's Progress   . Find Help in My Community;Manage mobility needs; manage pain needs       Timeframe:  Short-Term Goal Priority:  Medium Start Date:   07/03/2020                          Expected End Date:   10/03/2020                    Follow Up Date : 09/11/20   Find Help in My Community (Patient) Patient needs information about community resources to help her with mobility challenges and help her with completion of daily ADLs   Why is this important?    Knowing how and where to find help for yourself or family in your neighborhood and community is an important skill.   You will want to take some steps to learn how.     Patient Strengths: Takes medications as prescribed Attends scheduled medical appointments Self advocates as needed Has family support  Patient Deficits Some Pain issues Mobility challenges Challenges in doing ADLs o  Patient Goals:  Attend scheduled medical appointments Take time for rest and relaxation and enjoyment of hobbies Advocate for self and for understanding of her medical conditions Talk with RNCM to address nursing needs Talk with LCSW about community resources of possible benefit to client -  Follow Up Plan: LCSW to call client on 09/11/20 to assess client needs        [name].Uziel Covault MSW, LCSW Licensed Clinical Social Worker [facility] Care Management [phone]

## 2020-08-15 DIAGNOSIS — H2512 Age-related nuclear cataract, left eye: Secondary | ICD-10-CM | POA: Diagnosis not present

## 2020-08-16 DIAGNOSIS — H2511 Age-related nuclear cataract, right eye: Secondary | ICD-10-CM | POA: Diagnosis not present

## 2020-08-16 DIAGNOSIS — H2512 Age-related nuclear cataract, left eye: Secondary | ICD-10-CM | POA: Diagnosis not present

## 2020-08-19 ENCOUNTER — Ambulatory Visit (HOSPITAL_COMMUNITY)
Admission: RE | Admit: 2020-08-19 | Discharge: 2020-08-19 | Disposition: A | Discharge status: Home / Self Care | Payer: Medicare Other | Source: Ambulatory Visit | Attending: Family Medicine | Admitting: Family Medicine

## 2020-08-19 ENCOUNTER — Other Ambulatory Visit: Payer: Self-pay

## 2020-08-19 DIAGNOSIS — M1611 Unilateral primary osteoarthritis, right hip: Secondary | ICD-10-CM | POA: Diagnosis not present

## 2020-08-19 DIAGNOSIS — S73191A Other sprain of right hip, initial encounter: Secondary | ICD-10-CM | POA: Diagnosis not present

## 2020-08-22 ENCOUNTER — Encounter: Payer: Self-pay | Admitting: Orthopedic Surgery

## 2020-08-22 ENCOUNTER — Ambulatory Visit: Payer: Medicare Other | Admitting: Orthopedic Surgery

## 2020-08-22 ENCOUNTER — Other Ambulatory Visit: Payer: Self-pay

## 2020-08-22 VITALS — Ht 65.0 in | Wt 234.0 lb

## 2020-08-22 DIAGNOSIS — M1611 Unilateral primary osteoarthritis, right hip: Secondary | ICD-10-CM

## 2020-08-22 MED ORDER — DICLOFENAC SODIUM 75 MG PO TBEC: 75.0000 mg | DELAYED_RELEASE_TABLET | Freq: Two times a day (BID) | ORAL | 0 refills | Status: DC | PRN

## 2020-08-22 MED ORDER — HYDROCODONE-ACETAMINOPHEN 5-325 MG PO TABS: 1.0000 | ORAL_TABLET | Freq: Two times a day (BID) | ORAL | 0 refills | Status: AC | PRN

## 2020-08-22 NOTE — Progress Notes (Signed)
NEW PROBLEM//OFFICE VISIT  Summary assessment and plan:   70 year old female with diabetes and hypertension status post right total knee by Dr. Valentina Gu in 2015 has osteoarthritis in the left knee but more symptomatic right hip  MRI and x-ray showed degenerative arthritis right hip  Patient's BMI is under 40 and would benefit from total hip replacement  Patient is referred for possible anterior hip replacement  Chief Complaint  Patient presents with  . Hip Pain    Right hip on and off, into groin area,     70 year old female started having pain in her right hip and thigh including the groin about 2-1/2 to 3 months ago she was tried on Voltaren and eventually progressed to hydrocodone 5 mg but her pain continued.  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  All other systems reviewed and are negative.    Past Medical History:  Diagnosis Date  . Arthritis   . Dyspnea   . Hyperlipidemia   . Hypertension     Past Surgical History:  Procedure Laterality Date  . ABDOMINAL HYSTERECTOMY    . COLONOSCOPY N/A 11/25/2016   Procedure: COLONOSCOPY;  Surgeon: Malissa Hippo, MD;  Location: [facility] SUITE;  Service: Endoscopy;  Laterality: N/A;  830  . KNEE ARTHROSCOPY     BIL   2010   . LUNG BIOPSY     RIGHT 30 YRS AGO   . TOTAL KNEE ARTHROPLASTY Right 02/12/2014   Procedure: TOTAL KNEE ARTHROPLASTY;  Surgeon: Dannielle Huh, MD;  Location: [facility] OR;  Service: Orthopedics;  Laterality: Right;    Family History  Problem Relation Age of Onset  . Diabetes Mother   . Stroke Mother   . Alcohol abuse Father   . Stroke Father   . Colon cancer Neg Hx    Social History   Tobacco Use  . Smoking status:  Current Every Day Smoker    Packs/day: 0.50    Years: 35.00    Pack years: 17.50    Types: Cigarettes  . Smokeless tobacco: Never Used  Vaping Use  . Vaping Use: Never used  Substance Use Topics  . Alcohol use: No  . Drug use: No    Allergies  Allergen Reactions  . Oxycodone Nausea And Vomiting    Pt stated, "It makes me not want to eat"  . Hydrocodone Itching    Current Meds  Medication Sig  . acetaminophen (TYLENOL) 500 MG tablet Take 1,000 mg by mouth 2 (two) times daily as needed for mild pain or moderate pain.   Marland Kitchen atenolol (TENORMIN) 25 MG tablet Take 1 tablet by mouth once daily  . atorvastatin (LIPITOR) 40 MG tablet Take 1 tablet (40 mg total) by mouth at bedtime.  Marland Kitchen BESIVANCE 0.6 % SUSP Place 1 drop into the left eye 3 (three) times daily.  . Coenzyme Q-10 100 MG capsule Take 2 capsules (200 mg total) by mouth daily.  . metFORMIN (GLUCOPHAGE) 500 MG tablet Take 1 tablet by mouth once daily with breakfast  . Multiple Vitamin (MULTIVITAMIN) capsule Take 1 capsule by mouth daily.  . prednisoLONE acetate (PRED FORTE) 1 % ophthalmic suspension Place 1 drop into the left eye  4 (four) times daily.  Marland Kitchen PROLENSA 0.07 % SOLN Place 1 drop into the left eye at bedtime.  . triamterene-hydrochlorothiazide (MAXZIDE-25) 37.5-25 MG tablet Take 1 tablet by mouth daily. (NEEDS TO BE SEEN BEFORE NEXT REFILL)  . [DISCONTINUED] diclofenac (VOLTAREN) 75 MG EC tablet Take 1 tablet (75 mg total) by mouth 2 (two) times daily as needed for moderate pain.  . [DISCONTINUED] HYDROcodone-acetaminophen (NORCO) 5-325 MG tablet Take 1 tablet by mouth every 6 (six) hours as needed for severe pain.    Ht 5\' 5"  (1.651 m)   Wt 234 lb (106.1 kg)   BMI 38.94 kg/m   Physical Exam  General appearance: Well-developed well-nourished no gross deformities  Cardiovascular normal pulse and perfusion normal color without edema  Neurologically o sensation loss or deficits or pathologic reflexes  Psychological:  Awake alert and oriented x3 mood and affect normal  Skin no lacerations or ulcerations no nodularity no palpable masses, no erythema or nodularity  Musculoskeletal:   Right hip flexion 120 degrees with mild pain internal rotation limited to 15 degrees with more severe pain  Leg lengths are equal      MEDICAL DECISION MAKING  A.  Encounter Diagnoses  Name Primary?  . Unilateral primary osteoarthritis, right hip Yes  . Primary osteoarthritis of right hip     B. DATA ANALYSED:   IMAGING: Interpretation of images: External images including AP pelvis AP lateral right hip dated July 24, 2020 right hip shows sclerosis and cyst formation on both sides of the joint with complete obliteration of the joint globally peripheral osteophytes are noted as well  MRI confirms same picture no evidence of AVN  Orders: None  Outside records reviewed: None   C. MANAGEMENT   Recommend referral for anterior total hip  Meds ordered this encounter  Medications  . HYDROcodone-acetaminophen (NORCO) 5-325 MG tablet    Sig: Take 1 tablet by mouth 2 (two) times daily as needed for up to 5 days for severe pain.    Dispense:  10 tablet    Refill:  0  . diclofenac (VOLTAREN) 75 MG EC tablet    Sig: Take 1 tablet (75 mg total) by mouth 2 (two) times daily as needed for moderate pain.    Dispense:  60 tablet    Refill:  0      July 26, 2020, MD  08/22/2020 2:24 PM

## 2020-08-22 NOTE — Patient Instructions (Addendum)
Driving Directions to [employer] from [employer] address is [address] [place] The phone number is [phone] // Dr. Magnus Ivan  The office will call you with appointment  1. Start out going [place] on [address] toward [address].  Then 0.02 miles0.02 total miles 2. Take the 1st right onto [address]/[place]. Continue to follow [address].  If you reach [facility] you've gone a little too far  Then 0.58 miles0.60 total miles 3. Turn right onto [address].  [place] is just past [address]  Then 2.25 miles2.85 total miles 4. Take the [address] ramp toward [place].  Then 0.25 miles3.10 total miles 5. Merge onto [address]  Then 18.17 miles21.28 total miles 6. Merge onto [address]  Then 1.47 miles22.74 total miles 7. Turn right onto [place].  [address] is just past [address]  Then 0.11 miles22.85 total miles  8. [address], [place], [place] [address], [phone] [address] is on the left.  Total Hip Replacement Total hip replacement is a surgery to remove damaged bone in the hip joint and replace it with an artificial (prosthetic) hip joint. The hip is a ball-and-socket type of joint. It has two main parts:  Femoral head. This is the ball part of the joint and is the top of the thighbone (femur).  Acetabulum. This is the socket part of the joint. It is a large, hollow area on the outer side of the pelvis where the femur and pelvis meet. During total hip replacement, one or both parts of your hip joint are replaced, depending on the type of joint damage that you have. The purpose of this surgery is to reduce pain and improve your hip function. Common causes of hip pain and disability associated with total hip replacement include osteoarthritis, rheumatoid arthritis, osteonecrosis, post-traumatic arthritis, and childhood hip disease. Tell a health care provider about:  Any  allergies you have.  All medicines you are taking, including vitamins, herbs, eye drops, creams, and over-the-counter medicines.  Any problems you or family members have had with anesthetic medicines.  Any blood disorders you have.  Any surgeries you have had.  Any medical conditions you have.  Whether you are pregnant or may be pregnant. What are the risks? Generally, this is a safe procedure. However, problems may occur, including:  Infection.  Bleeding.  Allergic reactions to medicines.  Damage to nerves or other structures.  Problems with your man-made, or artificial, joint (prosthesis). These may include: ? Loosening or dislocation of your prosthetic joint (prosthesis). Dislocation means the joint has moved out of place.  Deep vein thrombosis. This is a blood clot that can develop in your leg and break loose and travel to your lungs (pulmonary embolus).  A bone break (fracture), loss of movement, or lasting stiffness or pain.  Compartment syndrome. This is swelling and pressure buildup in the joint. What happens before the procedure? Staying hydrated Follow instructions from your health care provider about hydration, which may include:  Up to 2 hours before the procedure - you may continue to drink clear liquids, such as water, clear fruit juice, black coffee, and plain tea.   Eating and drinking restrictions  Follow instructions from your health care provider about eating and drinking, which may include: ? 8 hours before the procedure - stop eating heavy meals or foods, such as meat, fried foods, or fatty foods. ?  6 hours before the procedure - stop eating light meals or foods, such as toast or cereal. ? 6 hours before the procedure - stop drinking milk or drinks that contain milk. ? 2 hours before the procedure - stop drinking clear liquids.  Do not drink alcohol for 48 hours before your surgery. Medicines Ask your health care provider about:  Changing or  stopping your regular medicines. This is especially important if you are taking diabetes medicines or blood thinners.  Taking medicines such as aspirin and ibuprofen. These medicines can thin your blood. Do not take these medicines unless your health care provider tells you to take them.  Taking over-the-counter medicines, vitamins, herbs, or supplements. Tests You may have an exam or testing, such as:  A physical exam, including medical history.  X-rays or an MRI.  An electrocardiogram (EKG).  Blood or urine tests. General instructions  Plan to have a responsible adult take you home from the hospital or clinic.  Plan to have a responsible adult care for you for the time you are told after you leave the hospital or clinic. This is important.  Prepare your home so you can be safe and have easy access to what you need.  Keep your body and teeth clean. Germs from anywhere in your body can travel to your new joint and infect it. Tell your health care provider if you: ? Plan to have dental care and routine cleanings. ? Develop any skin infections.  Avoid shaving your legs just before surgery. If hair removal is needed, it will be done at the hospital.  Ask your health care provider: ? How your surgery site will be marked. ? What steps will be taken to help prevent infection. These steps may include:  Removing hair at the surgery site.  Washing skin with a germ-killing soap.  Taking antibiotic medicine. What happens during the procedure?  An IV will be inserted into one of your veins.  You will be given one or more of the following: ? A medicine to help you relax (sedative). ? A medicine that is injected into your spine to numb the area below and slightly above the injection site (spinal anesthetic). ? A medicine that is injected into an area of your body to numb everything below the injection site (regional anesthetic). ? A medicine to make you fall asleep (general  anesthetic).  An incision will be made so the surgeon can see the bones and tissue in your hip. The location of the incision will depend on the approach used by the surgeon: ? Posterior approach. The incision will be at the back of the hip. ? Anterior approach. The incision will be at the front of the hip.  Then, your surgeon will: ? Cut and remove damaged pieces of bone and cartilage. ? Insert a prosthetic ball and socket into the hip joint. ? Secure the ball and socket in the hip joint. ? Do an X-ray of the hip joint to confirm correct placement. ? Place a drain to remove excess fluid, if needed. ? Close the incision and apply a bandage (dressing) over the surgical site. The procedure may vary among health care providers and hospitals. What happens after the procedure?  Your blood pressure, heart rate, breathing rate, and blood oxygen level will be monitored until you leave the hospital or clinic.  Your health care team will monitor how your nerves and blood vessels are working.  You will be given pain medicine.  You may have  to wear compression stockings. These stockings help to prevent blood clots and reduce swelling in your legs. You may also be given a blood-thinning (anticoagulant) medicine.  You will receive physical therapy until your health care provider feels it is safe for you to go home. You may need to use a walker, crutches, or a cane.  If you had the posterior approach, you may have to use a wedge (hip abduction) pillow when you are in bed. This pillow will protect your hip from dislocation by keeping it straight and will prevent your legs from turning inward or away from your body. Summary  Total hip replacement is a surgery to remove damaged bone in your hip joint and replace it with an artificial (prosthetic) hip joint.  Before the procedure, follow instructions from your health care provider about eating and drinking.  Plan to have a responsible adult take you  home from the hospital or clinic.  After the surgery, you may have to wear compression stockings. These help to prevent blood clots and reduce swelling in your legs. This information is not intended to replace advice given to you by your health care provider. Make sure you discuss any questions you have with your health care provider. Document Revised: 08/31/2019 Document Reviewed: 08/31/2019 Elsevier Patient Education  2021 Elsevier Inc.  Total Hip Replacement, Anterior, Care After This sheet gives you information about how to care for yourself after your procedure. Your health care provider may also give you more specific instructions. If you have problems or questions, contact your health care provider. What can I expect after the procedure? After the procedure, it is common to have:  Redness, pain, and swelling at your incision area.  Stiffness.  Discomfort.  A small amount of blood or clear fluid coming from your incision. Follow these instructions at home: Medicines  Take over-the-counter and prescription medicines only as told by your health care provider.  If you were prescribed a blood thinner (anticoagulant) to help prevent blood clots, take it as told by your health care provider.  Ask your health care provider if the medicine prescribed to you: ? Requires you to avoid driving or using machinery. ? Can cause constipation. You may need to take these actions to prevent or treat constipation:  Drink enough fluid to keep your urine pale yellow.  Take over-the-counter or prescription medicines.  Eat foods that are high in fiber, such as beans, whole grains, and fresh fruits and vegetables.  Limit foods that are high in fat and processed sugars, such as fried or sweet foods. Incision care  Follow instructions from your health care provider about how to take care of your incision. Make sure you: ? Wash your hands with soap and water for at least 20 seconds before and  after you change your bandage (dressing). If soap and water are not available, use hand sanitizer. ? Change your dressing as told by your health care provider. ? Leave stitches (sutures), staples, skin glue, or adhesive strips in place. These skin closures may need to stay in place for 2 weeks or longer. If adhesive strip edges start to loosen and curl up, you may trim the loose edges. Do not remove adhesive strips completely unless your health care provider tells you to do that.  Do not take baths, swim, or use a hot tub until your health care provider approves.  Check your incision area every day for signs of infection. Check for: ? More redness, swelling, or pain. ? More  fluid or blood. ? Warmth. ? Pus or a bad smell.   Managing pain, stiffness, and swelling  If directed, put ice on the affected area. To do this: ? Put ice in a plastic bag. ? Place a towel between your skin and the bag. ? Leave the ice on for 20 minutes, 2-3 times a day. ? Remove the ice if your skin turns bright red. This is very important. If you cannot feel pain, heat, or cold, you have a greater risk of damage to the area.  Move your toes often to reduce stiffness and swelling.  Raise (elevate) your leg above the level of your heart while you are lying down.   Activity  Ask your health care provider what activities are safe for you.  Avoid sitting for a long time without moving. Get up to take short walks every 1-2 hours. This is important to improve blood flow and breathing. Ask for help if you feel weak or unsteady.  Do exercises as told by your health care provider.  Do not use your legs to support (bear) your body weight until your health care provider says that you can. Follow instructions about how much weight you may safely support on your affected leg (weight-bearing restrictions).  Use a walker, crutches, or a cane as told by your health care provider.  Return to your normal activities as told by  your health care provider. Movement restrictions  To help prevent hip dislocation, follow instructions about movement restrictions as told. For example: ? Do not cross your legs at the knees. To remind yourself about this, you may keep a pillow between your legs while lying in bed. ? Do not bend at the hip and waist or bend farther than 90 degrees of hip flexion. To avoid bending this far:  Do not bring your knees higher than your hips.  Do not pick up something from the floor while sitting in a chair.  Avoid sitting in low chairs.  Use a raised toilet seat.  When standing up from a seated position, keep the injured leg out in front of you. ? Avoid twisting at your waist and reaching across your body to the side of the affected leg. ? Avoid rotating your toes inward.  When getting into a car: 1. Raise the seat as high as possible, move the seat as far back as it will go, and recline the upper part of the seat slightly. 2. Sit down into the seat with your injured leg extended out of the car. 3. Scoot back into the seat as you move the lower half of your body into the car. Try to avoid bumping your foot or leg as you bring it into the car.   Safety  To help prevent falls, keep floors clear of objects you may trip over, and place items that you may need within easy reach.  Wear an apron or tool belt with pockets for carrying objects. This leaves your hands free to help with your balance. General instructions  Ask your health care provider when it is safe to drive.  Wear compression stockings as told by your health care provider. These stockings help to prevent blood clots and reduce swelling in your legs.  Keep doing breathing exercises as told. These help prevent lung infection.  Do not use any products that contain nicotine or tobacco, such as cigarettes, e-cigarettes, and chewing tobacco. These can delay healing after surgery. If you need help quitting, ask your health care  provider.  Tell your health care provider if you plan to have dental work. Also: ? Tell your dentist about your joint replacement. ? Ask your health care provider if there are any special instructions you need to follow before having dental care and routine cleanings.  Keep all follow-up visits. This is important. Contact a health care provider if:  You have a fever or chills.  You have a cough.  Your medicine is not controlling your pain.  You have more redness, swelling, or pain around your incision.  You have more fluid or blood coming from your incision.  Your incision feels warm to the touch.  You have pus or a bad smell coming from your incision. Get help right away if:  You have severe pain.  You have shortness of breath or trouble breathing.  You have chest pain.  You have redness, swelling, pain, or warmth in your calf or leg.  Your incision breaks open after suturesor staples are removed. These symptoms may represent a serious problem that is an emergency. Do not wait to see if the symptoms will go away. Get medical help right away. Call your local emergency services (911 in the [place] Do not drive yourself to the hospital. Summary  Do not use your legs to support your body weight until your health care provider says that you can. Use a walker, crutches, or a cane as told.  To help prevent hip dislocation, follow instructions about movement restrictions as told.  Keep all follow-up visits. This is important. This information is not intended to replace advice given to you by your health care provider. Make sure you discuss any questions you have with your health care provider. Document Revised: 08/31/2019 Document Reviewed: 08/31/2019 Elsevier Patient Education  2021 Elsevier Inc.   Try to take the Voltaren for pain  If you have severe pain take the hydrocodone which is an opioid   Opioid Pain Medicine Management Opioid pain medicines are strong medicines  that are used to treat bad or very bad pain. When you take them for a short time, they can help you:  Sleep better.  Do better in physical therapy.  Feel better during the first few days after you get hurt.  Recover from surgery. Only take these medicines if a doctor says that you can. You should only take them for a short time. This is because opioids can be hard to stop taking (they are addictive). The longer you take opioids, the harder it may be to stop taking them (opioid use disorder). What are the risks? Opioids can cause problems (side effects). Taking them for more than 3 days raises your chance of problems, such as:  Trouble pooping (constipation).  Feeling sick to your stomach (nausea).  Vomiting.  Feeling very sleepy.  Confusion.  Not being able to stop taking the medicine.  Breathing problems. Taking opioids for a long time can make it hard for you to do daily tasks. It can also put you at risk for:  Car accidents.  Depression.  Suicide.  Heart attack.  Taking too much of the medicine (overdose), which can sometimes lead to death. What is a pain treatment plan? A pain treatment plan is a plan made by you and your doctor. Work with your doctor to make a plan for treating your pain. To help you do this:  Talk about the goals of your treatment, including: ? How much pain you might expect to have. ? How you will manage the  pain.  Talk about the risks and benefits of taking these medicines for your condition.  Remember that a good treatment plan uses more than one approach and lowers the risks of side effects.  Tell your doctor about the amount of medicines you take and about any drug or alcohol use.  Get your pain medicine prescriptions from only one doctor. Pain can be managed with other treatments. Work with your doctor to find other ways to help your pain, such as:  Physical therapy.  Counseling.  Eating healthy foods.  Brain  exercises.  Massage.  Meditation.  Other pain medicines.  Doing gentle exercises. Tapering your use of opioids If you have been taking opioids for more than a few weeks, you may need to slowly decrease (taper) how much you take until you stop taking them. Doing this can lower your chance of having symptoms, such as:  Pain and cramping in your belly (abdomen).  Feeling sick to your stomach.  Sweating.  Feeling very sleepy.  Feeling restless.  Shaking you cannot control (tremors).  Cravings for the medicine. Do not try to stop taking them by yourself. Work with your doctor to stop. Your doctor will help you take less until you are not taking the medicine at all. Follow these instructions at home: Safety and storage  While you are taking opioids: ? Do not drive. ? Do not use machines or power tools. ? Do not sign important papers (legal documents). ? Do not drink alcohol. ? Do not take sleeping pills. ? Do not take care of children by yourself. ? Do not do activities where you need to climb or be in high places, like working on a ladder. ? Do not go into any water, such as a lake, river, ocean, swimming pool, or hot tub.  Keep your opioids locked up or in a place where children cannot reach them.  Do not share your pain medicine with anyone.   Getting rid of leftover pills Do not save any leftover pills. Get rid of leftover pills safely by:  Taking them to a take-back program in your area.  Bringing them to a pharmacy that has a container for throwing away pills (pill disposal).  Flushing them down the toilet. Check the label or package insert of your medicine to see whether this is safe to do.  Throwing them in the trash. Check the label or package insert of your medicine to see whether this is safe to do. If it is safe to throw them out: 1. Take the pills out of their container. 2. Put the pills into a container you can seal. 3. Mix the pills with used coffee  grounds, food scraps, dirt, or cat litter. 4. Put this in the trash. Activity  Return to your normal activities as told by your doctor. Ask your doctor what activities are safe for you.  Avoid doing things that make your pain worse.  Do exercises as told by your doctor. General instructions  You may need to take these actions to prevent or treat trouble pooping: ? Drink enough fluid to keep your pee (urine) pale yellow. ? Take over-the-counter or prescription medicines. ? Eat foods that are high in fiber. These include beans, whole grains, and fresh fruits and vegetables. ? Limit foods that are high in fat and sugar. These include fried or sweet foods.  Keep all follow-up visits as told by your doctor. This is important. Where to find support If you have been taking opioids  for a long time, think about getting help quitting from a local support group or counselor. Ask your doctor about this. Where to find more information  Centers for Disease Control and Prevention (CDC): FootballExhibition.com.br  [place] Food and Drug Administration (FDA): PumpkinSearch.com.ee Get help right away if: Seek medical care right away if you are taking opioids and you, or people close to you, notice any of the following:  You have trouble breathing.  Your breathing is slower or more shallow than normal.  You have a very slow heartbeat.  You feel very confused.  You pass out (faint).  You are very sleepy.  Your speech is not normal.  You feel sick to your stomach and vomit.  You have cold skin.  You have blue lips or fingernails.  Your muscles are weak (limp) and your body seems floppy.  The black centers of your eyes (pupils) are smaller than normal. If you think that you or someone else may have taken too much of an opioid medicine, get medical help right away. Call your local emergency services (911 in the [place] Do not drive yourself to the hospital. If you ever feel like you may hurt yourself or others, or  have thoughts about taking your own life, get help right away. You can go to your nearest emergency department or call:  Your local emergency services (911 in the [place]  The hotline of the [facility] 9374221907 in the [place]  A suicide crisis helpline, such as the National Suicide Prevention Lifeline at 336 317 8677. This is open 24 hours a day. Summary  Opioid are strong medicines that are used to treat bad or very bad pain.  A pain treatment plan is a plan made by you and your doctor. Work with your doctor to make a plan for treating your pain.  Work with your doctor to find other ways to help your pain.  If you think that you or someone else may have taken too much of an opioid, get help right away. This information is not intended to replace advice given to you by your health care provider. Make sure you discuss any questions you have with your health care provider. Document Revised: 02/10/2019 Document Reviewed: 05/06/2018 Elsevier Patient Education  2021 [employer].

## 2020-08-27 ENCOUNTER — Ambulatory Visit (INDEPENDENT_AMBULATORY_CARE_PROVIDER_SITE_OTHER): Payer: Medicare Other | Admitting: *Deleted

## 2020-08-27 DIAGNOSIS — E785 Hyperlipidemia, unspecified: Secondary | ICD-10-CM

## 2020-08-27 DIAGNOSIS — E1169 Type 2 diabetes mellitus with other specified complication: Secondary | ICD-10-CM

## 2020-08-27 DIAGNOSIS — E1151 Type 2 diabetes mellitus with diabetic peripheral angiopathy without gangrene: Secondary | ICD-10-CM

## 2020-08-27 NOTE — Patient Instructions (Signed)
Visit Information  PATIENT GOALS: Goals Addressed            This Visit's Progress   . Manage My Blood Sugar & Monitor/Prevent DM Complications   On track    Timeframe:  Long-Range Goal Priority:  Medium Start Date: 08/01/20                            Expected End Date: 08/01/21                      Follow Up Date 10/08/20    . Check and record blood sugar daily . check blood sugar if I feel it is too high or too low . take the blood sugar log to all doctor visits . take the blood sugar meter to all doctor visits . limit fast food meals to no more than 1 per week . read food labels for fat, fiber, carbohydrates and portion size . check feet daily for cuts, sores or redness . wash and dry feet carefully every day . wear comfortable, cotton socks . wear comfortable, well-fitting shoes   Why is this important?    Checking your blood sugar at home helps to keep it from getting very high or very low.   Writing the results in a diary or log helps the doctor know how to care for you.   Your blood sugar log should have the time, date and the results.   Also, write down the amount of insulin or other medicine that you take.   Other information, like what you ate, exercise done and how you were feeling, will also be helpful.     Notes:     Marland Kitchen Manage My Cholesterol   Not on track    Timeframe:  Long-Range Goal Priority:  Medium Start Date:  08/01/20                           Expected End Date:  08/01/21                      Follow Up Date  10/08/20   . change to whole grain breads, cereal, pasta . at smaller or less servings of red meat . fill half the plate with nonstarchy vegetables . increase the amount of fiber in food . Manage portion sizes . read food labels for fat and fiber  . Increase activity level as tolerated with a goal of 150 minutes a week . Take medications as prescribed . Keep appointment with Dr Nadine Counts on 09/27/20 and have lipid panel done at that visit    Why is this important?   Changing cholesterol starts with eating heart-healthy foods.  Other steps may be to increase your activity and to quit if you smoke.    Notes:        Patient verbalizes understanding of instructions provided today and agrees to view in MyChart.   Follow Up Plan:  . Telephone follow up appointment with care management team member scheduled for: 10/08/20 with RNCM . We have been unable to make contact with the patient for follow up. The care management team is available to follow up with the patient after provider conversation with the patient regarding recommendation for care management engagement and subsequent re-referral to the care management team.  . Next PCP appointment scheduled for: 09/27/20 with Dr Nadine Counts  Demetrios Loll,  BSN, RN-BC Embedded Chronic Care Manager [facility] / [facility] Care Management Direct Dial: [phone]

## 2020-08-27 NOTE — Chronic Care Management (AMB) (Signed)
Chronic Care Management   CCM RN Visit Note  08/27/2020 Name: Jennifer [name] MRN: [id number] DOB: 1950-04-27  Subjective: Jennifer [name] is a 70 y.o. year old female who is a primary care patient of Raliegh Ip, DO. The care management team was consulted for assistance with disease management and care coordination needs.    Engaged with patient by telephone for follow up visit in response to provider referral for case management and/or care coordination services.   Consent to Services:  The patient was given information about Chronic Care Management services, agreed to services, and gave verbal consent prior to initiation of services.  Please see initial visit note for detailed documentation.   Patient agreed to services and verbal consent obtained.   Assessment: Review of patient past medical history, allergies, medications, health status, including review of consultants reports, laboratory and other test data, was performed as part of comprehensive evaluation and provision of chronic care management services.   SDOH (Social Determinants of Health) assessments and interventions performed:    CCM Care Plan  Allergies  Allergen Reactions  . Oxycodone Nausea And Vomiting    Pt stated, "It makes me not want to eat"  . Hydrocodone Itching    Outpatient Encounter Medications as of 08/27/2020  Medication Sig  . acetaminophen (TYLENOL) 500 MG tablet Take 1,000 mg by mouth 2 (two) times daily as needed for mild pain or moderate pain.   Marland Kitchen atenolol (TENORMIN) 25 MG tablet Take 1 tablet by mouth once daily  . atorvastatin (LIPITOR) 40 MG tablet Take 1 tablet (40 mg total) by mouth at bedtime.  Marland Kitchen BESIVANCE 0.6 % SUSP Place 1 drop into the left eye 3 (three) times daily.  . Coenzyme Q-10 100 MG capsule Take 2 capsules (200 mg total) by mouth daily.  . diclofenac (VOLTAREN) 75 MG EC tablet Take 1 tablet (75 mg total) by mouth 2 (two) times daily as needed for moderate pain.  Marland Kitchen  HYDROcodone-acetaminophen (NORCO) 5-325 MG tablet Take 1 tablet by mouth 2 (two) times daily as needed for up to 5 days for severe pain.  . metFORMIN (GLUCOPHAGE) 500 MG tablet Take 1 tablet by mouth once daily with breakfast  . Multiple Vitamin (MULTIVITAMIN) capsule Take 1 capsule by mouth daily.  . prednisoLONE acetate (PRED FORTE) 1 % ophthalmic suspension Place 1 drop into the left eye 4 (four) times daily.  Marland Kitchen PROLENSA 0.07 % SOLN Place 1 drop into the left eye at bedtime.  . triamterene-hydrochlorothiazide (MAXZIDE-25) 37.5-25 MG tablet Take 1 tablet by mouth daily. (NEEDS TO BE SEEN BEFORE NEXT REFILL)   No facility-administered encounter medications on file as of 08/27/2020.    Patient Active Problem List   Diagnosis Date Noted  . Hip pain 07/24/2020  . OSA on CPAP 04/04/2019  . Peripheral arterial disease (HCC) 09/21/2017  . Type 2 diabetes, with peripheral circulatory disorder not at goal [facility]) 08/16/2017  . Chronic pain of left ankle 08/11/2017  . Bilateral carpal tunnel syndrome 08/11/2017  . Hypertension associated with diabetes (HCC) 08/11/2017  . Morbid obesity (HCC) 08/11/2017  . 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 Hyperlipidemia associated with type 2 diabetes mellitus (HCC) 08/11/2017  . Tobacco use disorder 08/11/2017  . History of adenomatous polyp of colon 07/23/2016  . S/P total knee arthroplasty 02/12/2014    Conditions to be addressed/monitored:HLD and DMII  Care Plan : RNCM: Diabetes Type 2 (Adult)  Updates made by [name], RN since  08/27/2020 12:00 AM    Problem: Glycemic Management (Diabetes, Type 2)   Priority: Medium    Long-Range Goal: Glycemic Management Optimized   Start Date: 08/01/2020  This Visit's Progress: On track  Recent Progress: On track  Priority: Medium  Note:   Objective:  Lab Results  Component Value Date   HGBA1C 6.7 03/26/2020   HGBA1C 6.8 07/20/2019   HGBA1C 7.0 (H) 04/04/2019   Lab  Results  Component Value Date   LDLCALC 71 01/24/2018   CREATININE 0.89 07/20/2019   Current Barriers:  . Limited physical activity due to joint pain . Chronic Disease Management support and education needs related to diabetes in a patient with HTN & HLD  Case Manager Clinical Goal(s):  . patient will demonstrate improved adherence to prescribed treatment plan for diabetes self care/management as evidenced by: daily monitoring and recording of CBG, adherence to ADA/ carb modified diet, exercise 3-5 days/week  Interventions:  . Collaboration with Raliegh Ip, DO regarding development and update of comprehensive plan of care as evidenced by provider attestation and co-signature . Inter-disciplinary care team collaboration (see longitudinal plan of care) . Chart reviewed including relevant office notes and lab results . Reviewed medications with patient and discussed importance of medication adherence . Discussed home blood sugar testing o Encouraged to continue testing and recording blood sugar daily and to call PCP with any readings outside of recommended range . Reinforced need for ADA/Carb Modified Diet . Encouraged to increase activity level as tolerated o Has an appointment with orthopedic surgeon to discuss hip replacement  . Reviewed scheduled/upcoming provider appointments including: 09/27/20 with Dr Nadine Counts . Discussed plans with patient for ongoing care management follow up and provided patient with direct contact information for care management team  Self-Care Activities . Self administers oral medications as prescribed . Attends all scheduled provider appointments . Checks blood sugars as prescribed and utilize hyper and hypoglycemia protocol as needed  Patient Goals Over the next 30 days, patient will: . Check and record blood sugar daily . check blood sugar if I feel it is too high or too low . take the blood sugar log to all doctor visits . take the blood  sugar meter to all doctor visits . limit fast food meals to no more than 1 per week . read food labels for fat, fiber, carbohydrates and portion size . check feet daily for cuts, sores or redness . wash and dry feet carefully every day . wear comfortable, cotton socks . wear comfortable, well-fitting shoes    Care Plan : RNMC: Hyperlipidemia  Updates made by [name], RN since 08/27/2020 12:00 AM    Problem: Hyperlipidemia   Priority: Medium    Long-Range Goal: Manage My Cholesterol   Start Date: 08/01/2020  This Visit's Progress: Not on track  Recent Progress: Not on track  Priority: Medium  Note:   Objective: Lab Results  Component Value Date   CHOL 151 01/24/2018   HDL 38 (L) 01/24/2018   LDLCALC 71 01/24/2018   TRIG 208 (H) 01/24/2018   CHOLHDL 4.0 01/24/2018   Current Barriers:   Ineffective Self Health Maintenance   Limited physical activity level due to left knee and right pain  Clinical Goal(s):   patient will work with care management team to address care coordination and chronic disease management needs related to Disease Management-hyperlipidemia    Interventions:   Collaboration with Raliegh Ip, DO regarding development and update of comprehensive plan of care as  evidenced by provider attestation       and co-signature  Inter-disciplinary care team collaboration (see longitudinal plan of care)  Evaluation of current treatment plan related to HLD self-management and patient's adherence to plan as established by provider.  Discussed plans with patient for ongoing care management follow up and provided patient with direct contact information for care management team  Chart reviewed includuing recent office visits and most recent lab results  Last lipid panel was 07/2017  Reviewed and discussed upcoming PCP appt on 09/27/20  Appt note added that lipid panel is due  Discussed current limitations on physical activity due to left knee and  right hip pain  Has an appointment scheduled with orthopedic surgeon to discuss hip replacement  Encourged increased activity level as tolerated with an ultimate goal of 150 minutes a week  DASH diet recommended  Provided with CCM team contact information and encouraged to reach out as needed   Self Care Activities:  . Self administers medications as prescribed . Attends all scheduled provider appointments . Performs ADL's independently . Performs IADL's independently  Patient Goals: . change to whole grain breads, cereal, pasta . fill half of plate with vegetables . manage portion size . read food labels for fat, fiber, carbohydrates and portion size . reduce red meat to 2 to 3 times a week . Keep appointment with PCP for 09/27/20 . Take medication as prescribed . Increase physical acitivty level as tolerated with a goal of 150 minutes a week . Call RN Care Manager as needed [phone]     Follow Up Plan:  . Telephone follow up appointment with care management team member scheduled for: 10/08/20 with RNCM . We have been unable to make contact with the patient for follow up. The care management team is available to follow up with the patient after provider conversation with the patient regarding recommendation for care management engagement and subsequent re-referral to the care management team.  . Next PCP appointment scheduled for: 09/27/20 with Dr Nadine Counts  Demetrios Loll, BSN, RN-BC Embedded Chronic Care Manager [facility] / [facility] Care Management Direct Dial: [phone]

## 2020-08-29 ENCOUNTER — Encounter: Payer: Self-pay | Admitting: Physician Assistant

## 2020-08-29 ENCOUNTER — Telehealth: Payer: Self-pay

## 2020-08-29 ENCOUNTER — Ambulatory Visit: Payer: Medicare Other | Admitting: Physician Assistant

## 2020-08-29 VITALS — Ht 65.0 in | Wt 234.0 lb

## 2020-08-29 DIAGNOSIS — M1611 Unilateral primary osteoarthritis, right hip: Secondary | ICD-10-CM

## 2020-08-29 NOTE — Progress Notes (Signed)
Office Visit Note   Patient: Jennifer [name]           Date of Birth: 1950-09-20           MRN: [id number] Visit Date: 08/29/2020              Requested by: Vickki Hearing, MD [address] [place],  [place] [address] PCP: Raliegh Ip, DO   Assessment & Plan: Visit Diagnoses:  1. Primary osteoarthritis of right hip     Plan: We discussed treatment options for the severe arthritis in her hip.  Most likely at some point time she will require right total hip arthroplasty.  I did discuss the perioperative and postoperative protocol for anterior hip replacement with patient.  However due to the fact that she has upcoming surgeries on both eyes she would like to hold off on any surgical intervention.  Therefore we will have her undergo an intra-articular injection of the right hip under ultrasound or fluoroscopy.  She will follow-up with [place] after an right hip intra-articular injection with Dr. Prince Rome.  Suggest she follow-up with [place] in 4 to 6 weeks after the injection.  Questions were encouraged and answered at length.  Recommend that she use a cane in her left hand to offload her right hip.  Follow-Up Instructions: Return 4 to 6 weeks after intra-articular injection.   Orders:  No orders of the defined types were placed in this encounter.  No orders of the defined types were placed in this encounter.     Procedures: No procedures performed   Clinical Data: No additional findings.   Subjective: Chief Complaint  Patient presents with  . Right Hip - Pain    HPI Mrs. Jennifer [name] is a very pleasant 70 year old female were seen for the first time for right hip pain has been ongoing for the past 3 to 4 months.  She did have an MRI of her right hip and also plain radiographs of the right hip to her on epic.  Needs to reviewed by myself personally.  Radiographs right hip show end-stage arthritis by my read.  Both hips are well located.  Left hip well-maintained.  MRI right  hip showed severe arthritis involving the right hip.  Degenerative tearing of the superior labrum.  Also incidentally noted was gluteal muscle atrophy bilaterally consistent with a chronic tear. Patient has severe pain in the right hip she states is 9-10 out of 10 pain at worst.  She at times does walk with a cane or walker.  She uses cane right hand.  She takes Tylenol diclofenac and Norco for the pain.  She states these helped some.  She has difficulties with donning shoes and socks.  She also has difficulties with her activities of daily living.  No known injury to the hip.  History of right total knee arthroplasty 2015 by Dr. Valentina Gu is doing well. Patient is diabetic reports hemoglobin A1c to be 6.8.  She is recently undergone eye surgery for cataracts and has still upcoming surgery for her cataracts.  Review of Systems Negative for fevers or chills.  Please see HPI otherwise negative or noncontributory.  Objective: Vital Signs: Ht 5\' 5"  (1.651 m)   Wt 234 lb (106.1 kg)   BMI 38.94 kg/m   Physical Exam Constitutional:      Appearance: She is not ill-appearing or diaphoretic.  Pulmonary:     Effort: Pulmonary effort is normal.  Neurological:     Mental Status: She  is alert and oriented to person, place, and time.  Psychiatric:        Mood and Affect: Mood normal.     Ortho Exam Left hip excellent range of motion without pain.  Right hip severe pain with internal and external rotation but surprisingly decent range of motion.  Ambulates with an antalgic gait. Specialty Comments:  No specialty comments available.  Imaging: No results found.   PMFS History: Patient Active Problem List   Diagnosis Date Noted  . Hip pain 07/24/2020  . OSA on CPAP 04/04/2019  . Peripheral arterial disease (HCC) 09/21/2017  . Type 2 diabetes, with peripheral circulatory disorder not at goal [facility]) 08/16/2017  . Chronic pain of left ankle 08/11/2017  . Bilateral carpal tunnel syndrome 08/11/2017  .  Hypertension associated with diabetes (HCC) 08/11/2017  . Morbid obesity (HCC) 08/11/2017  . 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 Hyperlipidemia associated with type 2 diabetes mellitus (HCC) 08/11/2017  . Tobacco use disorder 08/11/2017  . History of adenomatous polyp of colon 07/23/2016  . S/P total knee arthroplasty 02/12/2014   Past Medical History:  Diagnosis Date  . Arthritis   . Dyspnea   . Hyperlipidemia   . Hypertension     Family History  Problem Relation Age of Onset  . Diabetes Mother   . Stroke Mother   . Alcohol abuse Father   . Stroke Father   . Colon cancer Neg Hx     Past Surgical History:  Procedure Laterality Date  . ABDOMINAL HYSTERECTOMY    . COLONOSCOPY N/A 11/25/2016   Procedure: COLONOSCOPY;  Surgeon: Malissa Hippo, MD;  Location: [facility]ITE;  Service: Endoscopy;  Laterality: N/A;  830  . KNEE ARTHROSCOPY     BIL   2010   . LUNG BIOPSY     RIGHT 30 YRS AGO   . TOTAL KNEE ARTHROPLASTY Right 02/12/2014   Procedure: TOTAL KNEE ARTHROPLASTY;  Surgeon: Dannielle Huh, MD;  Location: [facility] OR;  Service: Orthopedics;  Laterality: Right;   Social History   Occupational History  . Occupation: CNa    Employer: [employer]  Tobacco Use  . Smoking status: Current Every Day Smoker    Packs/day: 0.50    Years: 35.00    Pack years: 17.50    Types: Cigarettes  . Smokeless tobacco: Never Used  Vaping Use  . Vaping Use: Never used  Substance and Sexual Activity  . Alcohol use: No  . Drug use: No  . Sexual activity: Yes

## 2020-08-29 NOTE — Telephone Encounter (Signed)
Per Bronson Curb pt needs to be scheduled for right hip intra injection

## 2020-08-30 NOTE — Telephone Encounter (Signed)
LVM for pt  to cal back to schedule appt.

## 2020-09-04 ENCOUNTER — Ambulatory Visit: Payer: Medicare Other | Admitting: Family Medicine

## 2020-09-04 ENCOUNTER — Encounter: Payer: Self-pay | Admitting: Family Medicine

## 2020-09-04 ENCOUNTER — Ambulatory Visit: Payer: Self-pay

## 2020-09-04 DIAGNOSIS — M1611 Unilateral primary osteoarthritis, right hip: Secondary | ICD-10-CM

## 2020-09-04 NOTE — Progress Notes (Signed)
Subjective: Patient is here for ultrasound-guided intra-articular right hip injection.   Groin pain with severe DJD.  Objective:  Pain with IR.  Procedure: Ultrasound guided injection is preferred based studies that show increased duration, increased effect, greater accuracy, decreased procedural pain, increased response rate, and decreased cost with ultrasound guided versus blind injection.   Verbal informed consent obtained.  Time-out conducted.  Noted no overlying erythema, induration, or other signs of local infection. Ultrasound-guided right hip injection: After sterile prep with Betadine, injected 4 cc 0.25% bupivacaine without epinephrine and 6 mg betamethasone using a 22-gauge spinal needle, passing the needle through the iliofemoral ligament into the femoral head/neck junction.  Injectate seen filling the joint capsule.

## 2020-09-05 DIAGNOSIS — H2511 Age-related nuclear cataract, right eye: Secondary | ICD-10-CM | POA: Diagnosis not present

## 2020-09-06 DIAGNOSIS — H2511 Age-related nuclear cataract, right eye: Secondary | ICD-10-CM | POA: Diagnosis not present

## 2020-09-11 ENCOUNTER — Ambulatory Visit: Payer: Medicare Other | Admitting: Licensed Clinical Social Worker

## 2020-09-11 DIAGNOSIS — I152 Hypertension secondary to endocrine disorders: Secondary | ICD-10-CM

## 2020-09-11 DIAGNOSIS — E1169 Type 2 diabetes mellitus with other specified complication: Secondary | ICD-10-CM

## 2020-09-11 DIAGNOSIS — I739 Peripheral vascular disease, unspecified: Secondary | ICD-10-CM

## 2020-09-11 DIAGNOSIS — Z96651 Presence of right artificial knee joint: Secondary | ICD-10-CM

## 2020-09-11 DIAGNOSIS — G4733 Obstructive sleep apnea (adult) (pediatric): Secondary | ICD-10-CM

## 2020-09-11 DIAGNOSIS — E1151 Type 2 diabetes mellitus with diabetic peripheral angiopathy without gangrene: Secondary | ICD-10-CM | POA: Diagnosis not present

## 2020-09-11 DIAGNOSIS — E785 Hyperlipidemia, unspecified: Secondary | ICD-10-CM

## 2020-09-11 DIAGNOSIS — E1159 Type 2 diabetes mellitus with other circulatory complications: Secondary | ICD-10-CM

## 2020-09-11 DIAGNOSIS — Z9989 Dependence on other enabling machines and devices: Secondary | ICD-10-CM

## 2020-09-11 NOTE — Patient Instructions (Signed)
Visit Information  PATIENT GOALS: Goals Addressed            This Visit's Progress   . Find Help in My Community;Manage mobility needs; manage pain needs       Timeframe:  Short-Term Goal Priority:  Medium Progress: On Track Start Date:   09/11/20                          Expected End Date:   12/12/20                   Follow Up Date : 10/25/20   Find Help in My Community (Patient) Patient needs information about community resources to help her with mobility challenges and help her with completion of daily ADLs   Why is this important?    Knowing how and where to find help for yourself or family in your neighborhood and community is an important skill.   You will want to take some steps to learn how.     Patient Strengths: Takes medications as prescribed Attends scheduled medical appointments Self advocates as needed Has family support  Patient Deficits Some Pain issues Mobility challenges Challenges in doing ADLs o  Patient Goals:  Attend scheduled medical appointments Take time for rest and relaxation and enjoyment of hobbies Advocate for self and for understanding of her medical conditions Talk with RNCM to address nursing needs Talk with LCSW about community resources of possible benefit to client -  Follow Up Plan: LCSW to call client on 10/25/20 to assess client needs      .         [name].Brooksie Ellwanger MSW, LCSW Licensed Clinical Social Worker [facility] Care Management [phone]

## 2020-09-11 NOTE — Chronic Care Management (AMB) (Signed)
Chronic Care Management    Clinical Social Work Note  09/11/2020 Name: Jennifer [name] MRN: [id number] DOB: 1950/10/15  Jennifer [name] is a 70 y.o. year old female who is a primary care patient of Raliegh Ip, DO. The CCM team was consulted to assist the patient with chronic disease management and/or care coordination needs related to: [employer] .   Engaged with patient by telephone for follow up visit in response to provider referral for social work chronic care management and care coordination services.   Consent to Services:  The patient was given information about Chronic Care Management services, agreed to services, and gave verbal consent prior to initiation of services.  Please see initial visit note for detailed documentation.   Patient agreed to services and consent obtained.   Assessment: Review of patient past medical history, allergies, medications, and health status, including review of relevant consultants reports was performed today as part of a comprehensive evaluation and provision of chronic care management and care coordination services.     SDOH (Social Determinants of Health) assessments and interventions performed:  SDOH Interventions   Flowsheet Row Most Recent Value  SDOH Interventions   Depression Interventions/Treatment  --  [informed client of LCSW support and of RNCM support]       Advanced Directives Status: See Vynca application for related entries.  CCM Care Plan  Allergies  Allergen Reactions  . Oxycodone Nausea And Vomiting    Pt stated, "It makes me not want to eat"  . Hydrocodone Itching    Outpatient Encounter Medications as of 09/11/2020  Medication Sig  . acetaminophen (TYLENOL) 500 MG tablet Take 1,000 mg by mouth 2 (two) times daily as needed for mild pain or moderate pain.   Marland Kitchen atenolol (TENORMIN) 25 MG tablet Take 1 tablet by mouth once daily  . atorvastatin (LIPITOR) 40 MG tablet Take 1 tablet (40 mg total) by mouth  at bedtime.  Marland Kitchen BESIVANCE 0.6 % SUSP Place 1 drop into the left eye 3 (three) times daily.  . Coenzyme Q-10 100 MG capsule Take 2 capsules (200 mg total) by mouth daily.  . diclofenac (VOLTAREN) 75 MG EC tablet Take 1 tablet (75 mg total) by mouth 2 (two) times daily as needed for moderate pain.  Marland Kitchen gatifloxacin (ZYMAXID) 0.5 % SOLN Place 1 drop into the right eye 4 (four) times daily.  . metFORMIN (GLUCOPHAGE) 500 MG tablet Take 1 tablet by mouth once daily with breakfast  . Multiple Vitamin (MULTIVITAMIN) capsule Take 1 capsule by mouth daily.  . prednisoLONE acetate (PRED FORTE) 1 % ophthalmic suspension Place 1 drop into the left eye 4 (four) times daily.  Marland Kitchen PROLENSA 0.07 % SOLN Place 1 drop into the left eye at bedtime.  . triamterene-hydrochlorothiazide (MAXZIDE-25) 37.5-25 MG tablet Take 1 tablet by mouth daily. (NEEDS TO BE SEEN BEFORE NEXT REFILL)   No facility-administered encounter medications on file as of 09/11/2020.    Patient Active Problem List   Diagnosis Date Noted  . Hip pain 07/24/2020  . OSA on CPAP 04/04/2019  . Peripheral arterial disease (HCC) 09/21/2017  . Type 2 diabetes, with peripheral circulatory disorder not at goal [facility]) 08/16/2017  . Chronic pain of left ankle 08/11/2017  . Bilateral carpal tunnel syndrome 08/11/2017  . Hypertension associated with diabetes (HCC) 08/11/2017  . Morbid obesity (HCC) 08/11/2017  . 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 Hyperlipidemia associated with type 2 diabetes mellitus (HCC) 08/11/2017  . Tobacco  use disorder 08/11/2017  . History of adenomatous polyp of colon 07/23/2016  . S/P total knee arthroplasty 02/12/2014    Conditions to be addressed/monitored:Monitor client management of pain issues and mobility issues   Care Plan : LCSW care plan  Updates made by Isaiah Blakes, LCSW since 09/11/2020 12:00 AM    Problem: Coping Skills (General Plan of Care)     Goal: Learn more about community  resources to help client with mobility improvemet and help client with completion of daily ADLs   Start Date: 09/11/2020  Expected End Date: 12/12/2020  This Visit's Progress: On track  Recent Progress: On track  Priority: Medium  Note:   Current barriers:   . Patient in need of assistance with connecting to community resources for help as needed with mobility improvement of client and to help client complete daily ADLs . Patient is unable to independently navigate community resource options without care coordination support . Pain issues . Challenges in managing multiple medical conditions  Clinical Goals:  patient will work with SW in next 30 days  to address concerns related to mobility challenges of client  Patient will work with SW in next 30 days to address concerns related to ADLs completion of client on daily basis  Patient will communicate with RNCM or LCSW as needed for CCM support in next 30 days  Clinical Interventions:  . Collaboration with Raliegh Ip, DO regarding development and update of comprehensive plan of care as evidenced by provider attestation and co-signature . Talked with client about mobility of client (has a cane and walker to use as needed) . Talked with client about pain issues of client  . Talked with client about difficulty in standing (pain issues) . Talked with client about energy level of client . Talked with client about food provision of client . Talked with client about ADLs completion . Talked with client about family support (spouse is supportive, daughter is supportive) . Talked with client about vision of client . Encouraged client to call RNCM as needed for nursing support  Patient Strengths: Takes medications as prescribed Attends scheduled medical appointments Self advocates as needed Has family support  Patient Deficits Some Pain issues Mobility challenges Challenges in doing ADLs o  Patient Goals:  Attend scheduled medical  appointments Take time for rest and relaxation and enjoyment of hobbies Advocate for self and for understanding of her medical conditions Talk with RNCM to address nursing needs Talk with LCSW about community resources of possible benefit to client -  Follow Up Plan: LCSW to call client on 10/25/20 to assess client needs      [name].Harlei Lehrmann MSW, LCSW Licensed Clinical Social Worker [facility] Care Management [phone]

## 2020-09-22 ENCOUNTER — Other Ambulatory Visit: Payer: Self-pay | Admitting: Family Medicine

## 2020-09-24 ENCOUNTER — Other Ambulatory Visit: Payer: Self-pay | Admitting: Family Medicine

## 2020-09-27 ENCOUNTER — Ambulatory Visit (INDEPENDENT_AMBULATORY_CARE_PROVIDER_SITE_OTHER): Payer: Medicare Other | Admitting: Family Medicine

## 2020-09-27 ENCOUNTER — Other Ambulatory Visit: Payer: Self-pay

## 2020-09-27 ENCOUNTER — Encounter: Payer: Self-pay | Admitting: Family Medicine

## 2020-09-27 VITALS — BP 113/74 | HR 73 | Temp 96.5°F | Ht 65.0 in | Wt 228.6 lb

## 2020-09-27 DIAGNOSIS — E1159 Type 2 diabetes mellitus with other circulatory complications: Secondary | ICD-10-CM | POA: Diagnosis not present

## 2020-09-27 DIAGNOSIS — E1151 Type 2 diabetes mellitus with diabetic peripheral angiopathy without gangrene: Secondary | ICD-10-CM | POA: Diagnosis not present

## 2020-09-27 DIAGNOSIS — I152 Hypertension secondary to endocrine disorders: Secondary | ICD-10-CM | POA: Diagnosis not present

## 2020-09-27 DIAGNOSIS — E785 Hyperlipidemia, unspecified: Secondary | ICD-10-CM

## 2020-09-27 DIAGNOSIS — E1169 Type 2 diabetes mellitus with other specified complication: Secondary | ICD-10-CM | POA: Diagnosis not present

## 2020-09-27 LAB — BAYER DCA HB A1C WAIVED: HB A1C (BAYER DCA - WAIVED): 6.8 % (ref <7.0)

## 2020-09-27 MED ORDER — TRIAMTERENE-HCTZ 37.5-25 MG PO TABS: 1.0000 | ORAL_TABLET | Freq: Every day | ORAL | 3 refills | Status: DC

## 2020-09-27 MED ORDER — ATORVASTATIN CALCIUM 40 MG PO TABS: 1.0000 | ORAL_TABLET | Freq: Every day | ORAL | 3 refills | Status: DC

## 2020-09-27 MED ORDER — METFORMIN HCL 500 MG PO TABS: 1.0000 | ORAL_TABLET | Freq: Every day | ORAL | 3 refills | Status: DC

## 2020-09-27 MED ORDER — ATENOLOL 25 MG PO TABS: 25.0000 mg | ORAL_TABLET | Freq: Every day | ORAL | 3 refills | Status: DC

## 2020-09-27 NOTE — Patient Instructions (Signed)
Sugar looks great!  Congrats on your success with eye surgery!    Make sure you hold your bladder before next visit.  We'll check your kidneys through your urine.  You had labs performed today.  You will be contacted with the results of the labs once they are available, usually in the next 3 business days for routine lab work.  If you have an active my chart account, they will be released to your MyChart.  If you prefer to have these labs released to you via telephone, please let us know.  If you had a pap smear or biopsy performed, expect to be contacted in about 7-10 days.

## 2020-09-27 NOTE — Progress Notes (Signed)
Subjective: CC: DM PCP: Janora Norlander, DO Jennifer [name] is a 70 y.o. female presenting to clinic today for:  1. Type 2 Diabetes with hypertension, hyperlipidemia:  Patient is compliant with metformin 500 mg daily, atenolol 25 mg daily, Maxide 37.5-25mg and Lipitor 40 mg.  She reports that she is doing really well.  She had cataract surgery done in April and in May and notes that she has been seeing really clearly without her glasses.  She is very happy about the outcome.  No reports of chest pain, shortness of breath, neuropathy.  Last eye exam: Up-to-date Last foot exam: Needs Last A1c:  Lab Results  Component Value Date   HGBA1C 6.7 03/26/2020   Nephropathy screen indicated?:  Needs in December Last flu, zoster and/or pneumovax:  Immunization History  Administered Date(s) Administered   Fluad Quad(high Dose 65+) 02/07/2019, 03/08/2020   Influenza-Unspecified 01/13/2018   Moderna Sars-Covid-2 Vaccination 05/25/2019, 06/22/2019, 03/13/2020   Pneumococcal Conjugate-13 12/06/2018     ROS: Per HPI  Allergies  Allergen Reactions   Oxycodone Nausea And Vomiting    Pt stated, "It makes me not want to eat"   Hydrocodone Itching   Past Medical History:  Diagnosis Date   Arthritis    Dyspnea    Hyperlipidemia    Hypertension     Current Outpatient Medications:    acetaminophen (TYLENOL) 500 MG tablet, Take 1,000 mg by mouth 2 (two) times daily as needed for mild pain or moderate pain. , Disp: , Rfl:    atenolol (TENORMIN) 25 MG tablet, Take 1 tablet by mouth once daily, Disp: 90 tablet, Rfl: 0   atorvastatin (LIPITOR) 40 MG tablet, TAKE 1 TABLET BY MOUTH AT BEDTIME, Disp: 90 tablet, Rfl: 0   BESIVANCE 0.6 % SUSP, Place 1 drop into the left eye 3 (three) times daily., Disp: , Rfl:    Coenzyme Q-10 100 MG capsule, Take 2 capsules (200 mg total) by mouth daily., Disp: 60 capsule, Rfl: 6   diclofenac (VOLTAREN) 75 MG EC tablet, Take 1 tablet (75 mg total) by mouth  2 (two) times daily as needed for moderate pain., Disp: 60 tablet, Rfl: 0   gatifloxacin (ZYMAXID) 0.5 % SOLN, Place 1 drop into the right eye 4 (four) times daily., Disp: , Rfl:    metFORMIN (GLUCOPHAGE) 500 MG tablet, Take 1 tablet by mouth once daily with breakfast, Disp: 90 tablet, Rfl: 0   Multiple Vitamin (MULTIVITAMIN) capsule, Take 1 capsule by mouth daily., Disp: , Rfl:    prednisoLONE acetate (PRED FORTE) 1 % ophthalmic suspension, Place 1 drop into the left eye 4 (four) times daily., Disp: , Rfl:    PROLENSA 0.07 % SOLN, Place 1 drop into the left eye at bedtime., Disp: , Rfl:    triamterene-hydrochlorothiazide (MAXZIDE-25) 37.5-25 MG tablet, Take 1 tablet by mouth daily. (NEEDS TO BE SEEN BEFORE NEXT REFILL), Disp: 90 tablet, Rfl: 0 Social History   Socioeconomic History   Marital status: Married    Spouse name: Not on file   Number of children: 3   Years of education: 12   Highest education level: Not on file  Occupational History   Occupation: CNa    Employer: [employer]  Tobacco Use   Smoking status: Every Day    Packs/day: 0.50    Years: 35.00    Pack years: 17.50    Types: Cigarettes   Smokeless tobacco: Never  Vaping Use   Vaping Use: Never used  Substance and  Sexual Activity   Alcohol use: No   Drug use: No   Sexual activity: Yes  Other Topics Concern   Not on file  Social History Narrative   She lives with her husband   Social Determinants of Health   Financial Resource Strain: Low Risk    Difficulty of Paying Living Expenses: Not hard at all  Food Insecurity: No Food Insecurity   Worried About Charity fundraiser in the Last Year: Never true   Arboriculturist in the Last Year: Never true  Transportation Needs: No Transportation Needs   Lack of Transportation (Medical): No   Lack of Transportation (Non-Medical): No  Physical Activity: Inactive   Days of Exercise per Week: 0 days   Minutes of Exercise per Session: 0 min  Stress: No Stress Concern  Present   Feeling of Stress : Not at all  Social Connections: Socially Integrated   Frequency of Communication with Friends and Family: More than three times a week   Frequency of Social Gatherings with Friends and Family: More than three times a week   Attends Religious Services: More than 4 times per year   Active Member of [employer] or Organizations: Yes   Attends Music therapist: More than 4 times per year   Marital Status: Married  Human resources officer Violence: Not At Risk   Fear of Current or Ex-Partner: No   Emotionally Abused: No   Physically Abused: No   Sexually Abused: No   Family History  Problem Relation Age of Onset   Diabetes Mother    Stroke Mother    Alcohol abuse Father    Stroke Father    Colon cancer Neg Hx     Objective: Office vital signs reviewed. Pulse 73   Temp (!) 96.5 F (35.8 C) (Temporal)   Ht 5' 5"  (1.651 m)   Wt 228 lb 9.6 oz (103.7 kg)   SpO2 95%   BMI 38.04 kg/m   Physical Examination:  General: Awake, alert, well nourished, No acute distress HEENT: Normal.  Moist mucous membranes.  Sclera white.  She is not wearing her eyeglasses today Cardio: regular rate and rhythm, S1S2 heard, no murmurs appreciated Pulm: clear to auscultation bilaterally, no wheezes, rhonchi or rales; normal work of breathing on room air Extremities: warm, well perfused, No edema, cyanosis or clubbing; +2 pulses bilaterally MSK: Slightly antalgic gait with normal station Skin: dry; intact; no rashes or lesions Neuro: See DM foot Diabetic Foot Exam - Simple   Simple Foot Form Diabetic Foot exam was performed with the following findings: Yes 09/27/2020  1:18 PM  Visual Inspection See comments: Yes Sensation Testing Intact to touch and monofilament testing bilaterally: Yes Pulse Check Posterior Tibialis and Dorsalis pulse intact bilaterally: Yes Comments Onychomycotic changes noted throughout the nails bilaterally.  The third digit of the left foot shows a  nail that was cut too short.  It is hemostatic.      Assessment/ Plan: 70 y.o. 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 ([place]) - Plan: Bayer DCA Hb A1c Waived, metFORMIN (GLUCOPHAGE) 500 MG tablet  Hypertension associated with diabetes ([place]) - Plan: Lipid panel, CBC with Differential/Platelet, CMP14+EGFR, atenolol (TENORMIN) 25 MG tablet, triamterene-hydrochlorothiazide (MAXZIDE-25) 37.5-25 MG tablet  Hyperlipidemia associated with type 2 diabetes mellitus ([place]) - Plan: atorvastatin (LIPITOR) 40 MG tablet  Blood sugar under good control.  A1c 6.8 today.  Continue current regimen with metformin.  This has been renewed  Blood  pressure under good control.  Continue current regimen.  Refills have been sent  Continue statin.  Orders Placed This Encounter  Procedures   Lipid panel   CBC with Differential/Platelet   CMP14+EGFR   Bayer DCA Hb A1c Waived   Meds ordered this encounter  Medications   atenolol (TENORMIN) 25 MG tablet    Sig: Take 1 tablet (25 mg total) by mouth daily.    Dispense:  90 tablet    Refill:  3   triamterene-hydrochlorothiazide (MAXZIDE-25) 37.5-25 MG tablet    Sig: Take 1 tablet by mouth daily.    Dispense:  90 tablet    Refill:  3   atorvastatin (LIPITOR) 40 MG tablet    Sig: Take 1 tablet (40 mg total) by mouth at bedtime.    Dispense:  90 tablet    Refill:  3   metFORMIN (GLUCOPHAGE) 500 MG tablet    Sig: Take 1 tablet (500 mg total) by mouth daily with breakfast.    Dispense:  90 tablet    Refill:  3     Knox Holdman Windell Moulding, DO [place] [phone]

## 2020-09-28 LAB — CMP14+EGFR
ALT: 20 IU/L (ref 0–32)
AST: 21 IU/L (ref 0–40)
Albumin/Globulin Ratio: 1.5 (ref 1.2–2.2)
Albumin: 4.5 g/dL (ref 3.8–4.8)
Alkaline Phosphatase: 119 IU/L (ref 44–121)
BUN/Creatinine Ratio: 19 (ref 12–28)
BUN: 20 mg/dL (ref 8–27)
Bilirubin Total: <0.2 mg/dL (ref 0.0–1.2)
CO2: 23 mmol/L (ref 20–29)
Calcium: 9.7 mg/dL (ref 8.7–10.3)
Chloride: 99 mmol/L (ref 96–106)
Creatinine, Ser: 1.03 mg/dL — ABNORMAL HIGH (ref 0.57–1.00)
Globulin, Total: 3.1 g/dL (ref 1.5–4.5)
Glucose: 103 mg/dL — ABNORMAL HIGH (ref 65–99)
Potassium: 3.8 mmol/L (ref 3.5–5.2)
Sodium: 140 mmol/L (ref 134–144)
Total Protein: 7.6 g/dL (ref 6.0–8.5)
eGFR: 60 mL/min/{1.73_m2} (ref >59)

## 2020-09-28 LAB — CBC WITH DIFFERENTIAL/PLATELET
Basophils Absolute: 0.1 10*3/uL (ref 0.0–0.2)
Basos: 1 %
EOS (ABSOLUTE): 0.3 10*3/uL (ref 0.0–0.4)
Eos: 4 %
Hematocrit: 39.9 % (ref 34.0–46.6)
Hemoglobin: 13.1 g/dL (ref 11.1–15.9)
Immature Grans (Abs): 0 10*3/uL (ref 0.0–0.1)
Immature Granulocytes: 0 %
Lymphocytes Absolute: 2.1 10*3/uL (ref 0.7–3.1)
Lymphs: 25 %
MCH: 29 pg (ref 26.6–33.0)
MCHC: 32.8 g/dL (ref 31.5–35.7)
MCV: 89 fL (ref 79–97)
Monocytes Absolute: 0.8 10*3/uL (ref 0.1–0.9)
Monocytes: 9 %
Neutrophils Absolute: 5 10*3/uL (ref 1.4–7.0)
Neutrophils: 61 %
Platelets: 263 10*3/uL (ref 150–450)
RBC: 4.51 x10E6/uL (ref 3.77–5.28)
RDW: 14.1 % (ref 11.7–15.4)
WBC: 8.3 10*3/uL (ref 3.4–10.8)

## 2020-09-28 LAB — LIPID PANEL
Chol/HDL Ratio: 5.3 ratio — ABNORMAL HIGH (ref 0.0–4.4)
Cholesterol, Total: 187 mg/dL (ref 100–199)
HDL: 35 mg/dL — ABNORMAL LOW (ref >39)
LDL Chol Calc (NIH): 75 mg/dL (ref 0–99)
Triglycerides: 490 mg/dL — ABNORMAL HIGH (ref 0–149)
VLDL Cholesterol Cal: 77 mg/dL — ABNORMAL HIGH (ref 5–40)

## 2020-10-01 ENCOUNTER — Other Ambulatory Visit: Payer: Self-pay

## 2020-10-01 DIAGNOSIS — E782 Mixed hyperlipidemia: Secondary | ICD-10-CM

## 2020-10-04 ENCOUNTER — Other Ambulatory Visit: Payer: Medicare Other

## 2020-10-04 ENCOUNTER — Other Ambulatory Visit: Payer: Self-pay

## 2020-10-04 DIAGNOSIS — E782 Mixed hyperlipidemia: Secondary | ICD-10-CM | POA: Diagnosis not present

## 2020-10-04 DIAGNOSIS — Z1231 Encounter for screening mammogram for malignant neoplasm of breast: Secondary | ICD-10-CM | POA: Diagnosis not present

## 2020-10-05 LAB — LIPID PANEL
Chol/HDL Ratio: 4.7 ratio — ABNORMAL HIGH (ref 0.0–4.4)
Cholesterol, Total: 173 mg/dL (ref 100–199)
HDL: 37 mg/dL — ABNORMAL LOW (ref >39)
LDL Chol Calc (NIH): 81 mg/dL (ref 0–99)
Triglycerides: 341 mg/dL — ABNORMAL HIGH (ref 0–149)
VLDL Cholesterol Cal: 55 mg/dL — ABNORMAL HIGH (ref 5–40)

## 2020-10-08 ENCOUNTER — Other Ambulatory Visit: Payer: Self-pay | Admitting: Family Medicine

## 2020-10-08 ENCOUNTER — Encounter: Payer: Self-pay | Admitting: *Deleted

## 2020-10-08 ENCOUNTER — Ambulatory Visit (INDEPENDENT_AMBULATORY_CARE_PROVIDER_SITE_OTHER): Payer: Medicare Other | Admitting: *Deleted

## 2020-10-08 DIAGNOSIS — E1151 Type 2 diabetes mellitus with diabetic peripheral angiopathy without gangrene: Secondary | ICD-10-CM

## 2020-10-08 DIAGNOSIS — M1811 Unilateral primary osteoarthritis of first carpometacarpal joint, right hand: Secondary | ICD-10-CM | POA: Diagnosis not present

## 2020-10-08 DIAGNOSIS — E1169 Type 2 diabetes mellitus with other specified complication: Secondary | ICD-10-CM

## 2020-10-08 DIAGNOSIS — E782 Mixed hyperlipidemia: Secondary | ICD-10-CM

## 2020-10-08 DIAGNOSIS — E785 Hyperlipidemia, unspecified: Secondary | ICD-10-CM

## 2020-10-08 MED ORDER — ICOSAPENT ETHYL 1 G PO CAPS
2.0000 g | ORAL_CAPSULE | Freq: Two times a day (BID) | ORAL | 12 refills | Status: DC
Start: 2020-10-08 — End: 2020-10-08

## 2020-10-08 MED ORDER — OMEGA-3-ACID ETHYL ESTERS 1 G PO CAPS
2.0000 g | ORAL_CAPSULE | Freq: Two times a day (BID) | ORAL | 12 refills | Status: DC
Start: 2020-10-08 — End: 2020-12-17

## 2020-10-09 NOTE — Patient Instructions (Signed)
Visit Information  PATIENT GOALS:  Goals Addressed             This Visit's Progress    Manage My Blood Sugar & Monitor/Prevent DM Complications   On track    Timeframe:  Long-Range Goal Priority:  Medium Start Date: 08/01/20                            Expected End Date: 08/01/21                      Follow Up Date 11/08/20   Check and record blood sugar daily check blood sugar if I feel it is too high or too low take the blood sugar log to all doctor visits take the blood sugar meter to all doctor visits limit fast food meals to no more than 1 per week read food labels for fat, fiber, carbohydrates and portion size check feet daily for cuts, sores or redness wash and dry feet carefully every day wear comfortable, cotton socks wear comfortable, well-fitting shoes Take tylenol for pain and stop taking ibuprofen or other NSAIDs Schedule follow-up with PCP in 03/2021   Why is this important?   Checking your blood sugar at home helps to keep it from getting very high or very low.  Writing the results in a diary or log helps the doctor know how to care for you.  Your blood sugar log should have the time, date and the results.  Also, write down the amount of insulin or other medicine that you take.  Other information, like what you ate, exercise done and how you were feeling, will also be helpful.     Notes:       Manage My Cholesterol   Not on track    Timeframe:  Long-Range Goal Priority:  Medium Start Date:  08/01/20                           Expected End Date:  08/01/21                      Follow Up Date 11/08/20   change to whole grain breads, cereal, pasta fill half of plate with vegetables manage portion size read food labels for fat, fiber, carbohydrates and portion size reduce red meat to 2 to 3 times a week Return to [facility] fasting to have lipid panel drawn Take medication as prescribed Follow-up with orthopedic surgeon regarding need for hip replacement  Call RN  Care Manager as needed [phone]   Why is this important?   Changing cholesterol starts with eating heart-healthy foods.  Other steps may be to increase your activity and to quit if you smoke.    Notes:          Patient verbalizes understanding of instructions provided today and agrees to view in MyChart.   Telephone follow up appointment with care management team member scheduled for: 11/08/20 with RNCM  Demetrios Loll, BSN, RN-BC Embedded Chronic Care Manager [facility] Direct Dial: [phone]

## 2020-10-09 NOTE — Chronic Care Management (AMB) (Signed)
Chronic Care Management   CCM RN Visit Note  10/08/2020 Name: Jennifer [name] MRN: [id number] DOB: December 21, 1950  Subjective: Jennifer [name] is a 70 y.o. year old female who is a primary care patient of Raliegh Ip, DO. The care management team was consulted for assistance with disease management and care coordination needs.    Engaged with patient by telephone for follow up visit in response to provider referral for case management and/or care coordination services.   Consent to Services:  The patient was given information about Chronic Care Management services, agreed to services, and gave verbal consent prior to initiation of services.  Please see initial visit note for detailed documentation.   Patient agreed to services and verbal consent obtained.   Assessment: Review of patient past medical history, allergies, medications, health status, including review of consultants reports, laboratory and other test data, was performed as part of comprehensive evaluation and provision of chronic care management services.   SDOH (Social Determinants of Health) assessments and interventions performed:    CCM Care Plan  Allergies  Allergen Reactions   Oxycodone Nausea And Vomiting    Pt stated, "It makes me not want to eat"   Hydrocodone Itching    Outpatient Encounter Medications as of 10/08/2020  Medication Sig   acetaminophen (TYLENOL) 500 MG tablet Take 1,000 mg by mouth 2 (two) times daily as needed for mild pain or moderate pain.    atenolol (TENORMIN) 25 MG tablet Take 1 tablet (25 mg total) by mouth daily.   atorvastatin (LIPITOR) 40 MG tablet Take 1 tablet (40 mg total) by mouth at bedtime.   Coenzyme Q-10 100 MG capsule Take 2 capsules (200 mg total) by mouth daily.   diclofenac (VOLTAREN) 75 MG EC tablet Take 1 tablet (75 mg total) by mouth 2 (two) times daily as needed for moderate pain.   metFORMIN (GLUCOPHAGE) 500 MG tablet Take 1 tablet (500 mg total) by mouth daily  with breakfast.   Multiple Vitamin (MULTIVITAMIN) capsule Take 1 capsule by mouth daily.   triamterene-hydrochlorothiazide (MAXZIDE-25) 37.5-25 MG tablet Take 1 tablet by mouth daily.   [DISCONTINUED] icosapent Ethyl (VASCEPA) 1 g capsule Take 2 capsules (2 g total) by mouth 2 (two) times daily.   No facility-administered encounter medications on file as of 10/08/2020.    Patient Active Problem List   Diagnosis Date Noted   Hip pain 07/24/2020   OSA on CPAP 04/04/2019   Peripheral arterial disease (HCC) 09/21/2017   Type 2 diabetes, with peripheral circulatory disorder not at goal [facility]) 08/16/2017   Chronic pain of left ankle 08/11/2017   Bilateral carpal tunnel syndrome 08/11/2017   Hypertension associated with diabetes (HCC) 08/11/2017   Morbid obesity (HCC) 08/11/2017   Localized primary osteoarthritis of left lower leg 08/11/2017   Hyperlipidemia associated with type 2 diabetes mellitus (HCC) 08/11/2017   Tobacco use disorder 08/11/2017   History of adenomatous polyp of colon 07/23/2016   S/P total knee arthroplasty 02/12/2014    Conditions to be addressed/monitored:HLD, DMII, and hip pain due to arthritis  Care Plan : RNCM: Diabetes Type 2 (Adult)  Updates made by [name], RN since 10/09/2020 12:00 AM     Problem: Glycemic Management (Diabetes, Type 2)   Priority: Medium     Long-Range Goal: Glycemic Management Optimized   Start Date: 08/01/2020  This Visit's Progress: On track  Recent Progress: On track  Priority: Medium  Note:   Objective:  Lab Results  Component Value  Date   HGBA1C 6.8 09/27/2020   HGBA1C 6.7 03/26/2020   HGBA1C 6.8 07/20/2019   Lab Results  Component Value Date   LDLCALC 81 10/04/2020   CREATININE 1.03 (H) 09/27/2020  Current Barriers:  Limited physical activity due to joint pain Chronic Disease Management support and education needs related to diabetes in a patient with HTN & HLD  Case Manager Clinical Goal(s):  patient will  demonstrate improved adherence to prescribed treatment plan for diabetes self care/management as evidenced by: daily monitoring and recording of CBG, adherence to ADA/ carb modified diet, exercise 3-5 days/week Patient will work with PCP regarding medical management of diabetes Patient will work with Medical illustrator regarding self management of diabetes  Interventions:  Collaboration with Raliegh Ip, DO regarding development and update of comprehensive plan of care as evidenced by provider attestation and co-signature Inter-disciplinary care team collaboration (see longitudinal plan of care) Chart reviewed including relevant office notes and lab results Reviewed and discussed labs from 09/27/20 and provider recommendation to avoid NSAIDs and to maintain hydration Clarified that she can take Tylenol for pain but should stop taking ibuprofen Explained the difference in tylenol and NSAIDs and provided examples Advised to schedule a follow-up appointment with PCP in Dec 2022 and to repeat CMP at that time Reviewed medications with patient and discussed importance of medication adherence She is not taking diclofenac or vicodin and should not take diclofenac due to elev creatinine Discussed home blood sugar testing Encouraged to continue testing and recording blood sugar daily and to call PCP with any readings outside of recommended range Reinforced need for ADA/Carb Modified Diet Encouraged to increase activity level as tolerated Had an injection her hip and has a follow-up appointment with orthopedic surgeon to discuss hip replacement  Reviewed scheduled/upcoming provider appointments Discussed plans with patient for ongoing care management follow up and provided patient with direct contact information for care management team  Self-Care Activities Self administers oral medications as prescribed Attends all scheduled provider appointments Checks blood sugars as prescribed and utilize  hyper and hypoglycemia protocol as needed  Patient Goals Over the next 90 days, patient will: Check and record blood sugar daily check blood sugar if I feel it is too high or too low take the blood sugar log to all doctor visits take the blood sugar meter to all doctor visits limit fast food meals to no more than 1 per week read food labels for fat, fiber, carbohydrates and portion size check feet daily for cuts, sores or redness wash and dry feet carefully every day wear comfortable, cotton socks wear comfortable, well-fitting shoes Take tylenol for pain and stop taking ibuprofen or other NSAIDs Schedule follow-up with PCP in 03/2021  Follow Up Plan:  Telephone follow up appointment with care management team member scheduled for:11/08/20 with RNCM     Care Plan : [facility]: Hyperlipidemia  Updates made by [name], RN since 10/09/2020 12:00 AM     Problem: Hyperlipidemia   Priority: Medium     Long-Range Goal: Manage My Cholesterol   Start Date: 08/01/2020  This Visit's Progress: Not on track  Recent Progress: Not on track  Priority: Medium  Note:   Objective: Lab Results  Component Value Date   CHOL 173 10/04/2020   HDL 37 (L) 10/04/2020   LDLCALC 81 10/04/2020   TRIG 341 (H) 10/04/2020   CHOLHDL 4.7 (H) 10/04/2020  (Non fasting)  Current Barriers:  Ineffective Self Health Maintenance  Limited physical activity level  due to left knee and right pain  Clinical Goal(s):  patient will work with care management team to address care coordination and chronic disease management needs related to Disease Management-hyperlipidemia Patient will work with PCP regarding medical management of hyperlipidemia Patient will take medication daily as prescribed    Interventions:  Collaboration with Raliegh Ip, DO regarding development and update of comprehensive plan of care as evidenced by provider attestation       and co-signature Inter-disciplinary care team  collaboration (see longitudinal plan of care) Evaluation of current treatment plan related to HLD self-management and patient's adherence to plan as established by provider. Discussed plans with patient for ongoing care management follow up and provided patient with direct contact information for care management team Chart reviewed includuing recent office visits and most recent lab results Lipid panel was abnormal on 09/27/20 because she wasn't fasting Reminded that an order is in for a repeat lipid panel and that she should return to [facility] fasting to have this drawn Discussed current limitations on physical activity due to left knee and right hip pain Has an appointment scheduled with orthopedic surgeon to discuss hip replacement Low sodium as well as ADA/Carb modified diet recommended Provided with CCM team contact information and encouraged to reach out as needed   Self Care Activities:  Self administers medications as prescribed Attends all scheduled provider appointments Performs ADL's independently Performs IADL's independently  Patient Goals: Over the net 90  change to whole grain breads, cereal, pasta fill half of plate with vegetables manage portion size read food labels for fat, fiber, carbohydrates and portion size reduce red meat to 2 to 3 times a week Return to [facility] fasting to have lipid panel drawn Take medication as prescribed Follow-up with orthopedic surgeon regarding need for hip replacement  Call RN Care Manager as needed [phone]  Follow Up Plan:  Telephone follow up appointment with care management team member scheduled for: 11/08/20 with RNCM     Demetrios Loll, BSN, RN-BC Embedded Chronic Care Manager [facility] Care Management Direct Dial: [phone]

## 2020-10-11 ENCOUNTER — Telehealth: Payer: Self-pay

## 2020-10-11 NOTE — Telephone Encounter (Signed)
Patient called she stated she received a cortisone injection 5/18 she stated she is having pain and wants to receive another dose, I advised patient cortisone injections are every 3 months patient wants to know if she can receive a injection now call back:[phone]

## 2020-10-11 NOTE — Telephone Encounter (Signed)
This was an intra-articular cortisone injection of the right hip. Please advise.

## 2020-10-14 NOTE — Telephone Encounter (Signed)
I left voicemail for patient advising. Asked that she call back to office and schedule follow up appointment with Dr. Magnus Ivan.

## 2020-10-25 ENCOUNTER — Ambulatory Visit (INDEPENDENT_AMBULATORY_CARE_PROVIDER_SITE_OTHER): Payer: Medicare Other | Admitting: Licensed Clinical Social Worker

## 2020-10-25 DIAGNOSIS — E1169 Type 2 diabetes mellitus with other specified complication: Secondary | ICD-10-CM | POA: Diagnosis not present

## 2020-10-25 DIAGNOSIS — E1159 Type 2 diabetes mellitus with other circulatory complications: Secondary | ICD-10-CM | POA: Diagnosis not present

## 2020-10-25 DIAGNOSIS — G4733 Obstructive sleep apnea (adult) (pediatric): Secondary | ICD-10-CM

## 2020-10-25 DIAGNOSIS — I152 Hypertension secondary to endocrine disorders: Secondary | ICD-10-CM

## 2020-10-25 DIAGNOSIS — E1151 Type 2 diabetes mellitus with diabetic peripheral angiopathy without gangrene: Secondary | ICD-10-CM | POA: Diagnosis not present

## 2020-10-25 DIAGNOSIS — I739 Peripheral vascular disease, unspecified: Secondary | ICD-10-CM

## 2020-10-25 DIAGNOSIS — Z96651 Presence of right artificial knee joint: Secondary | ICD-10-CM

## 2020-10-25 DIAGNOSIS — E785 Hyperlipidemia, unspecified: Secondary | ICD-10-CM | POA: Diagnosis not present

## 2020-10-25 DIAGNOSIS — M1611 Unilateral primary osteoarthritis, right hip: Secondary | ICD-10-CM | POA: Diagnosis not present

## 2020-10-25 DIAGNOSIS — Z9989 Dependence on other enabling machines and devices: Secondary | ICD-10-CM

## 2020-10-25 NOTE — Patient Instructions (Signed)
Visit Information  PATIENT GOALS:  Goals Addressed             This Visit's Progress    Find Help in My Community;Manage mobility needs; manage pain needs       Timeframe:  Short-Term Goal Priority:  Medium Progress: On Track Start Date:   10/25/20                          Expected End Date: 01/15/21                   Follow Up Date : 12/04/20   Find Help in My Community (Patient) Patient needs information about community resources to help her with mobility challenges and help her with completion of daily ADLs  Why is this important?   Knowing how and where to find help for yourself or family in your neighborhood and community is an important skill.  You will want to take some steps to learn how.    Patient Strengths: Takes medications as prescribed Attends scheduled medical appointments Self advocates as needed Has family support  Patient Deficits Some Pain issues Mobility challenges Challenges in doing ADLs  Patient Goals:  Attend scheduled medical appointments Take time for rest and relaxation and enjoyment of hobbies Advocate for self and for understanding of her medical conditions Talk with RNCM to address nursing needs Talk with LCSW about community resources of possible benefit to client -  Follow Up Plan: LCSW to call client on 12/04/20 to assess client needs      Jennifer [name].Jennifer [name] MSW, LCSW Licensed Clinical Social Worker [facility] Care Management [phone]

## 2020-10-25 NOTE — Chronic Care Management (AMB) (Signed)
Chronic Care Management    Clinical Social Work Note  10/25/2020 Name: Jennifer [name] MRN: [id number] DOB: 1950/09/01  Jennifer [name] is a 70 y.o. year old female who is a primary care patient of Raliegh Ip, DO. The CCM team was consulted to assist the patient with chronic disease management and/or care coordination needs related to: [employer] .   Engaged with patient by telephone for follow up visit in response to provider referral for social work chronic care management and care coordination services.   Consent to Services:  The patient was given information about Chronic Care Management services, agreed to services, and gave verbal consent prior to initiation of services.  Please see initial visit note for detailed documentation.   Patient agreed to services and consent obtained.   Assessment: Review of patient past medical history, allergies, medications, and health status, including review of relevant consultants reports was performed today as part of a comprehensive evaluation and provision of chronic care management and care coordination services.     SDOH (Social Determinants of Health) assessments and interventions performed:  SDOH Interventions    Flowsheet Row Most Recent Value  SDOH Interventions   Depression Interventions/Treatment  --  [informed client of LCSW support and of RNCM support]        Advanced Directives Status: See Vynca application for related entries.  CCM Care Plan  Allergies  Allergen Reactions   Oxycodone Nausea And Vomiting    Pt stated, "It makes me not want to eat"   Hydrocodone Itching    Outpatient Encounter Medications as of 10/25/2020  Medication Sig   acetaminophen (TYLENOL) 500 MG tablet Take 1,000 mg by mouth 2 (two) times daily as needed for mild pain or moderate pain.    atenolol (TENORMIN) 25 MG tablet Take 1 tablet (25 mg total) by mouth daily.   atorvastatin (LIPITOR) 40 MG tablet Take 1 tablet (40 mg total) by  mouth at bedtime.   Coenzyme Q-10 100 MG capsule Take 2 capsules (200 mg total) by mouth daily.   diclofenac (VOLTAREN) 75 MG EC tablet Take 1 tablet (75 mg total) by mouth 2 (two) times daily as needed for moderate pain.   metFORMIN (GLUCOPHAGE) 500 MG tablet Take 1 tablet (500 mg total) by mouth daily with breakfast.   Multiple Vitamin (MULTIVITAMIN) capsule Take 1 capsule by mouth daily.   omega-3 acid ethyl esters (LOVAZA) 1 g capsule Take 2 capsules (2 g total) by mouth 2 (two) times daily.   triamterene-hydrochlorothiazide (MAXZIDE-25) 37.5-25 MG tablet Take 1 tablet by mouth daily.   No facility-administered encounter medications on file as of 10/25/2020.    Patient Active Problem List   Diagnosis Date Noted   Hip pain 07/24/2020   OSA on CPAP 04/04/2019   Peripheral arterial disease (HCC) 09/21/2017   Type 2 diabetes, with peripheral circulatory disorder not at goal [facility]) 08/16/2017   Chronic pain of left ankle 08/11/2017   Bilateral carpal tunnel syndrome 08/11/2017   Hypertension associated with diabetes (HCC) 08/11/2017   Morbid obesity (HCC) 08/11/2017   Localized primary osteoarthritis of left lower leg 08/11/2017   Hyperlipidemia associated with type 2 diabetes mellitus (HCC) 08/11/2017   Tobacco use disorder 08/11/2017   History of adenomatous polyp of colon 07/23/2016   S/P total knee arthroplasty 02/12/2014    Conditions to be addressed/monitored: monitor client completion of ADLs; monitor client mobility issues. Monitor client management of pain issues faced   Care Plan : LCSW care plan  Updates made by Isaiah Blakes, LCSW since 10/25/2020 12:00 AM     Problem: Coping Skills (General Plan of Care)      Start Date: 10/25/2020  Expected End Date: 01/15/2021  This Visit's Progress: On track  Recent Progress: On track  Priority: Medium  Note:   Current barriers:   Patient in need of assistance with connecting to community resources for help as needed with  mobility improvement of client and to help client complete daily ADLs Patient is unable to independently navigate community resource options without care coordination support Pain issues Challenges in managing multiple medical conditions  Clinical Goals:  patient will work with SW in next 30 days  to address concerns related to mobility challenges of client  Patient will work with SW in next 30 days to address concerns related to ADLs completion of client on daily basis  Patient will communicate with RNCM or LCSW as needed for CCM support in next 30 days  Clinical Interventions:  Collaboration with Raliegh Ip, DO regarding development and update of comprehensive plan of care as evidenced by provider attestation and co-signature Talked with Jerrye Beavers about her appointment next week with orthopedic doctor, Dr. Magnus Ivan, related to pain issues Talked with client about mobility of client (has a cane and walker to use as needed) Talked with client about pain issues of client  Talked with client about difficulty in standing (pain issues) Talked with client about energy level of client (she said she has low energy) Talked with client about food provision of client Talked with client about ADLs completion Talked with client about family support (spouse is supportive, daughter is supportive) Talked with client about vision of client Encouraged client to call RNCM as needed for nursing support  Patient Strengths: Takes medications as prescribed Attends scheduled medical appointments Self advocates as needed Has family support  Patient Deficits Some Pain issues Mobility challenges Challenges in doing ADLs  Patient Goals:  Attend scheduled medical appointments Take time for rest and relaxation and enjoyment of hobbies Advocate for self and for understanding of her medical conditions Talk with RNCM to address nursing needs Talk with LCSW about community resources of possible benefit to  client -  Follow Up Plan: LCSW to call client on 12/04/20 to assess client needs       [name].Vonda Harth MSW, LCSW Licensed Clinical Social Worker [facility] Care Management [phone]

## 2020-10-28 ENCOUNTER — Encounter: Payer: Self-pay | Admitting: Orthopaedic Surgery

## 2020-10-28 ENCOUNTER — Ambulatory Visit: Payer: Medicare Other | Admitting: Orthopaedic Surgery

## 2020-10-28 VITALS — Ht 65.0 in | Wt 225.0 lb

## 2020-10-28 DIAGNOSIS — M1611 Unilateral primary osteoarthritis, right hip: Secondary | ICD-10-CM

## 2020-10-28 NOTE — Progress Notes (Signed)
Office Visit Note   Patient: Jennifer [name]           Date of Birth: 1950-12-30           MRN: [id number] Visit Date: 10/28/2020              Requested by: Raliegh Ip, DO [address] [place],  [place] [address] PCP: Raliegh Ip, DO   Assessment & Plan: Visit Diagnoses:  1. Primary osteoarthritis of right hip     Plan: At this point she is going to consider hip replacement surgery.  I talked her in in length in detail about this type of surgery and gave her handout about it.  I went over her x-rays with her.  I described the intraoperative and postoperative course as well as described the risk and benefits of surgery.  She has had a knee replacement done before by one of my colleagues in town.  That was a struggle for her and I gave her reassurance that recovery from hip replacement is definitely different and can be easier than knee replacement recovery.  She has her surgery scheduler's card.  She will call if she wants [place] to schedule her for a right total hip arthroplasty.  All questions and concerns were answered and addressed.  Follow-Up Instructions: Return if symptoms worsen or fail to improve.   Orders:  No orders of the defined types were placed in this encounter.  No orders of the defined types were placed in this encounter.     Procedures: No procedures performed   Clinical Data: No additional findings.   Subjective: Chief Complaint  Patient presents with   Right Hip - Follow-up  The patient is being seen in follow-up status post an intra-articular steroid injection in her right hip joint under direct ultrasound.  She said the injection helped for short period of time but she wants another injection soon as possible.  That injection was done under 2 months ago.  She understands that it is much too early to have an injection such as this.  She is also diabetic.  Her last hemoglobin A1c a month ago was 6.8.  Her BMI is 37.44.  At this point she is  ambulating with a walker.  Her hip pain is debilitating and has been getting significantly worse.  She has tried and failed all forms of conservative treatment including weight loss, activity modification, physical therapy, intra-articular steroid injections and oral anti-inflammatories.  This is been getting worse for over 12 months now.  HPI  Review of Systems There is currently listed no headache, chest pain, short of breath, fever, chills, nausea, vomiting  Objective: Vital Signs: Ht 5\' 5"  (1.651 m)   Wt 225 lb (102.1 kg)   BMI 37.44 kg/m   Physical Exam She is alert and orient x3 and in no acute distress Ortho Exam Examination of her left hip is entirely normal with smooth and fluid range of motion.  Examination of her right painful hip shows severe limitations of rotation secondary to stiffness as well as pain.  She cannot cross her leg on that side and has difficulty putting shoes and socks on on that side. Specialty Comments:  No specialty comments available.  Imaging: No results found. Previous x-rays shared with her again of her right hip and left hip shows severe end-stage arthritis of the right hip.  There is complete loss of the joint space.  There are periarticular osteophytes combined with sclerotic and  cystic changes.  PMFS History: Patient Active Problem List   Diagnosis Date Noted   Hip pain 07/24/2020   OSA on CPAP 04/04/2019   Peripheral arterial disease (HCC) 09/21/2017   Type 2 diabetes, with peripheral circulatory disorder not at goal [facility]) 08/16/2017   Chronic pain of left ankle 08/11/2017   Bilateral carpal tunnel syndrome 08/11/2017   Hypertension associated with diabetes (HCC) 08/11/2017   Morbid obesity (HCC) 08/11/2017   Localized primary osteoarthritis of left lower leg 08/11/2017   Hyperlipidemia associated with type 2 diabetes mellitus (HCC) 08/11/2017   Tobacco use disorder 08/11/2017   History of adenomatous polyp of colon 07/23/2016   S/P  total knee arthroplasty 02/12/2014   Past Medical History:  Diagnosis Date   Arthritis    Dyspnea    Hyperlipidemia    Hypertension     Family History  Problem Relation Age of Onset   Diabetes Mother    Stroke Mother    Alcohol abuse Father    Stroke Father    Colon cancer Neg Hx     Past Surgical History:  Procedure Laterality Date   ABDOMINAL HYSTERECTOMY     COLONOSCOPY N/A 11/25/2016   Procedure: COLONOSCOPY;  Surgeon: Malissa Hippo, MD;  Location: [facility];  Service: Endoscopy;  Laterality: N/A;  830   EYE SURGERY     KNEE ARTHROSCOPY     BIL   2010    LUNG BIOPSY     RIGHT 30 YRS AGO    TOTAL KNEE ARTHROPLASTY Right 02/12/2014   Procedure: TOTAL KNEE ARTHROPLASTY;  Surgeon: Dannielle Huh, MD;  Location: [facility] OR;  Service: Orthopedics;  Laterality: Right;   Social History   Occupational History   Occupation: CNa    Employer: [employer]  Tobacco Use   Smoking status: Every Day    Packs/day: 0.50    Years: 35.00    Pack years: 17.50    Types: Cigarettes   Smokeless tobacco: Never  Vaping Use   Vaping Use: Never used  Substance and Sexual Activity   Alcohol use: No   Drug use: No   Sexual activity: Yes

## 2020-10-31 ENCOUNTER — Other Ambulatory Visit: Payer: Self-pay | Admitting: Orthopaedic Surgery

## 2020-10-31 ENCOUNTER — Telehealth: Payer: Self-pay | Admitting: Orthopaedic Surgery

## 2020-10-31 MED ORDER — TRAMADOL HCL 50 MG PO TABS: 100.0000 mg | ORAL_TABLET | Freq: Four times a day (QID) | ORAL | 0 refills | Status: DC | PRN

## 2020-10-31 NOTE — Telephone Encounter (Signed)
Pt called requesting pain medication. Pt stated she has been trying to call surgery scheduler to scheduler surgery and still waiting for a response. Please call patient about this matter at [phone].

## 2020-11-01 NOTE — Telephone Encounter (Signed)
Pt aware of the below much

## 2020-11-06 ENCOUNTER — Other Ambulatory Visit: Payer: Self-pay | Admitting: Orthopaedic Surgery

## 2020-11-06 NOTE — Telephone Encounter (Signed)
ok 

## 2020-11-08 ENCOUNTER — Telehealth: Payer: Medicare Other

## 2020-11-08 ENCOUNTER — Other Ambulatory Visit: Payer: Self-pay | Admitting: *Deleted

## 2020-11-12 ENCOUNTER — Other Ambulatory Visit: Payer: Self-pay

## 2020-11-18 ENCOUNTER — Other Ambulatory Visit: Payer: Self-pay | Admitting: Orthopaedic Surgery

## 2020-12-02 ENCOUNTER — Other Ambulatory Visit: Payer: Self-pay | Admitting: Physician Assistant

## 2020-12-04 ENCOUNTER — Ambulatory Visit (INDEPENDENT_AMBULATORY_CARE_PROVIDER_SITE_OTHER): Payer: Medicare Other | Admitting: Licensed Clinical Social Worker

## 2020-12-04 DIAGNOSIS — E1151 Type 2 diabetes mellitus with diabetic peripheral angiopathy without gangrene: Secondary | ICD-10-CM

## 2020-12-04 DIAGNOSIS — E1169 Type 2 diabetes mellitus with other specified complication: Secondary | ICD-10-CM

## 2020-12-04 DIAGNOSIS — E785 Hyperlipidemia, unspecified: Secondary | ICD-10-CM | POA: Diagnosis not present

## 2020-12-04 DIAGNOSIS — I152 Hypertension secondary to endocrine disorders: Secondary | ICD-10-CM | POA: Diagnosis not present

## 2020-12-04 DIAGNOSIS — E1159 Type 2 diabetes mellitus with other circulatory complications: Secondary | ICD-10-CM

## 2020-12-04 DIAGNOSIS — M1811 Unilateral primary osteoarthritis of first carpometacarpal joint, right hand: Secondary | ICD-10-CM | POA: Diagnosis not present

## 2020-12-04 DIAGNOSIS — I739 Peripheral vascular disease, unspecified: Secondary | ICD-10-CM

## 2020-12-04 DIAGNOSIS — G4733 Obstructive sleep apnea (adult) (pediatric): Secondary | ICD-10-CM

## 2020-12-04 DIAGNOSIS — Z9989 Dependence on other enabling machines and devices: Secondary | ICD-10-CM

## 2020-12-04 DIAGNOSIS — Z96651 Presence of right artificial knee joint: Secondary | ICD-10-CM

## 2020-12-04 NOTE — Patient Instructions (Signed)
Visit Information  PATIENT GOALS:  Goals Addressed             This Visit's Progress    Find Help in My Community;Manage mobility needs; manage pain needs       Timeframe:  Short-Term Goal Priority:  Medium Progress: On Track Start Date:   12/04/20                          Expected End Date: 03/06/21                   Follow Up Date : 01/14/21   Find Help in My Community (Patient) Patient needs information about community resources to help her with mobility challenges, pain management, and to help her with completion of daily ADLs   Why is this important?   Knowing how and where to find help for yourself or family in your neighborhood and community is an important skill.  You will want to take some steps to learn how.    Patient Strengths: Takes medications as prescribed Attends scheduled medical appointments Self advocates as needed Has family support  Patient Deficits Some Pain issues Mobility challenges Challenges in doing ADLs  Patient Goals:  Attend scheduled medical appointments Take time for rest and relaxation and enjoyment of hobbies Advocate for self and for understanding of her medical conditions Talk with RNCM to address nursing needs Talk with LCSW about community resources of possible benefit to client -  Follow Up Plan: LCSW to call client on 01/14/21 to assess client needs             Jennifer [name].Jennifer [name] MSW, LCSW Licensed Clinical Social Worker [facility] Care Management [phone]

## 2020-12-04 NOTE — Chronic Care Management (AMB) (Signed)
Chronic Care Management    Clinical Social Work Note  12/04/2020 Name: Jennifer [name] MRN: [id number] DOB: 1951-01-23  Jennifer [name] is a 70 y.o. year old female who is a primary care patient of Raliegh Ip, DO. The CCM team was consulted to assist the patient with chronic disease management and/or care coordination needs related to: [employer] .   Engaged with patient by telephone for follow up visit in response to provider referral for social work chronic care management and care coordination services.   Consent to Services:  The patient was given information about Chronic Care Management services, agreed to services, and gave verbal consent prior to initiation of services.  Please see initial visit note for detailed documentation.   Patient agreed to services and consent obtained.   Assessment: Review of patient past medical history, allergies, medications, and health status, including review of relevant consultants reports was performed today as part of a comprehensive evaluation and provision of chronic care management and care coordination services.     SDOH (Social Determinants of Health) assessments and interventions performed:  SDOH Interventions    Flowsheet Row Most Recent Value  SDOH Interventions   Physical Activity Interventions Other (Comments)  [Hip pain issues]  Depression Interventions/Treatment  --  [informed client of LCSW support and of RNCM support]        Advanced Directives Status: See Vynca application for related entries.  CCM Care Plan  Allergies  Allergen Reactions   Oxycodone Nausea And Vomiting    Pt stated, "It makes me not want to eat"   Hydrocodone Itching    Outpatient Encounter Medications as of 12/04/2020  Medication Sig   acetaminophen (TYLENOL) 500 MG tablet Take 1,000 mg by mouth 2 (two) times daily as needed for mild pain or moderate pain.    atenolol (TENORMIN) 25 MG tablet Take 1 tablet (25 mg total) by mouth  daily.   atorvastatin (LIPITOR) 40 MG tablet Take 1 tablet (40 mg total) by mouth at bedtime.   Coenzyme Q-10 100 MG capsule Take 2 capsules (200 mg total) by mouth daily.   diclofenac (VOLTAREN) 75 MG EC tablet Take 1 tablet (75 mg total) by mouth 2 (two) times daily as needed for moderate pain.   metFORMIN (GLUCOPHAGE) 500 MG tablet Take 1 tablet (500 mg total) by mouth daily with breakfast.   Multiple Vitamin (MULTIVITAMIN) capsule Take 1 capsule by mouth daily.   omega-3 acid ethyl esters (LOVAZA) 1 g capsule Take 2 capsules (2 g total) by mouth 2 (two) times daily.   traMADol (ULTRAM) 50 MG tablet TAKE 2 TABLETS BY MOUTH EVERY 6 HOURS AS NEEDED   triamterene-hydrochlorothiazide (MAXZIDE-25) 37.5-25 MG tablet Take 1 tablet by mouth daily.   No facility-administered encounter medications on file as of 12/04/2020.    Patient Active Problem List   Diagnosis Date Noted   Hip pain 07/24/2020   OSA on CPAP 04/04/2019   Peripheral arterial disease (HCC) 09/21/2017   Type 2 diabetes, with peripheral circulatory disorder not at goal [facility]) 08/16/2017   Chronic pain of left ankle 08/11/2017   Bilateral carpal tunnel syndrome 08/11/2017   Hypertension associated with diabetes (HCC) 08/11/2017   Morbid obesity (HCC) 08/11/2017   Localized primary osteoarthritis of left lower leg 08/11/2017   Hyperlipidemia associated with type 2 diabetes mellitus (HCC) 08/11/2017   Tobacco use disorder 08/11/2017   History of adenomatous polyp of colon 07/23/2016   S/P total knee arthroplasty 02/12/2014    Conditions  to be addressed/monitored: monitor client completion of ADLs as she is able   Care Plan : LCSW care plan  Updates made by Isaiah Blakes, LCSW since 12/04/2020 12:00 AM     Problem: Coping Skills (General Plan of Care)      Goal: Learn more about community resources to help client with mobility improvemet and help client with completion of daily ADLs   Start Date: 12/04/2020  Expected  End Date: 03/05/2021  This Visit's Progress: On track  Recent Progress: On track  Priority: Medium  Note:   Current barriers:   Patient in need of assistance with connecting to community resources for help as needed with mobility improvement of client and to help client complete daily ADLs Patient is unable to independently navigate community resource options without care coordination support Pain issues Challenges in managing multiple medical conditions  Clinical Goals:  patient will work with SW in next 30 days  to address concerns related to mobility challenges of client  Patient will work with SW in next 30 days to address concerns related to ADLs completion of client on daily basis  Patient will communicate with RNCM or LCSW as needed for CCM support in next 30 days  Clinical Interventions:  Collaboration with Raliegh Ip, DO regarding development and update of comprehensive plan of care as evidenced by provider attestation and co-signature Talked with Jerrye Beavers about her recent appointment with orthopedic doctor, Dr. Magnus Ivan, related to pain issues of client  Talked with client about mobility of client (has a cane and walker to use as needed) Talked with client about pain issues of client  Talked with client about difficulty in standing (pain issues) Talked with client about energy level of client (she said she has low energy) Talked with client about ADLs completion Talked with client about family support (spouse is supportive, daughter is supportive) Encouraged client to call RNCM as needed for nursing support Talked with client about her upcoming hip surgery at [facility] in [place], [place] on 12/17/20 Talked with client about transport needs of client  Patient Strengths: Takes medications as prescribed Attends scheduled medical appointments Self advocates as needed Has family support  Patient Deficits Some Pain issues Mobility challenges Challenges in doing  ADLs  Patient Goals:  Attend scheduled medical appointments Take time for rest and relaxation and enjoyment of hobbies Advocate for self and for understanding of her medical conditions Talk with RNCM to address nursing needs Talk with LCSW about community resources of possible benefit to client -  Follow Up Plan: LCSW to call client on 01/14/21 to assess client needs       [name].Zriyah Kopplin MSW, LCSW Licensed Clinical Social Worker [facility] Care Management [phone]

## 2020-12-05 NOTE — Progress Notes (Addendum)
Surgical Instructions    Your procedure is scheduled on Tuesday, 12/17/20.  Report to [facility] Main Entrance "A" at 12:15 pm, then check in with the Admitting office.  Call this number if you have problems the morning of surgery:  [phone]   If you have any questions prior to your surgery date call [phone]: Open Monday-Friday 8am-4pm    Remember:  Do not eat after midnight the night before your surgery - Monday  You may drink clear liquids until 11:15 am the morning of your surgery-Tuesday.   Clear liquids allowed are: Water, Non-Citrus Juices (without pulp), Carbonated Beverages, Clear Tea, Black Coffee Only, and Gatorade    Take these medicines the morning of surgery with A SIP OF WATER:  atenolol (TENORMIN) acetaminophen (TYLENOL) if needed traMADol (ULTRAM) if needed   As of today, STOP taking any Aspirin (unless otherwise instructed by your surgeon) Aleve, Naproxen, Ibuprofen, Motrin, Advil, Goody's, BC's, all herbal medications, fish oil, and all vitamins.          Do not wear jewelry or makeup Do not wear lotions, powders, perfumes or deodorant. Do not shave 48 hours prior to surgery.   Do not bring valuables to the hospital. DO Not wear nail polish, gel polish, artificial nails, or any other type of covering on natural nails including finger and toenails. If patients have artificial nails, gel coating, etc. that need to be removed by a nail salon please have this removed prior to surgery or surgery may need to be canceled/delayed if the surgeon/ anesthesia feels like the patient is unable to be adequately monitored.             [facility] is not responsible for any belongings or valuables.  Do NOT Smoke (Tobacco/Vaping) or drink Alcohol 24 hours prior to your procedure If you use a CPAP at night, you may bring all equipment for your overnight stay.   Contacts, glasses, dentures or bridgework may not be worn into surgery, please bring cases for these  belongings   For patients admitted to the hospital, discharge time will be determined by your treatment team.    ONLY 1 SUPPORT PERSON MAY BE PRESENT WHILE YOU ARE IN SURGERY. IF YOU ARE TO BE ADMITTED ONCE YOU ARE IN YOUR ROOM YOU WILL BE ALLOWED TWO (2) VISITORS.  Minor children may have two parents present. Special consideration for safety and communication needs will be reviewed on a case by case basis.  Special instructions:    Oral Hygiene is also important to reduce your risk of infection.  Remember - BRUSH YOUR TEETH THE MORNING OF SURGERY WITH YOUR REGULAR TOOTHPASTE   [place]- Preparing For Surgery  Before surgery, you can play an important role. Because skin is not sterile, your skin needs to be as free of germs as possible. You can reduce the number of germs on your skin by washing with CHG (chlorahexidine gluconate) Soap before surgery.  CHG is an antiseptic cleaner which kills germs and bonds with the skin to continue killing germs even after washing.     Please do not use if you have an allergy to CHG or antibacterial soaps. If your skin becomes reddened/irritated stop using the CHG.  Do not shave (including legs and underarms) for at least 48 hours prior to first CHG shower. It is OK to shave your face.  Please follow these instructions carefully.     Shower the [employer] SURGERY-Monday and the MORNING OF SURGERY-Tuesday with CHG Soap.  If you chose to wash your hair, wash your hair first as usual with your normal shampoo. After you shampoo, rinse your hair and body thoroughly to remove the shampoo.  Then [employer] and genitals (private parts) with your normal soap and rinse thoroughly to remove soap.  After that Use CHG Soap as you would any other liquid soap. You can apply CHG directly to the skin and wash gently with a scrungie or a clean washcloth.   Apply the CHG Soap to your body ONLY FROM THE NECK DOWN.  Do not use on open wounds or open sores. Avoid contact  with your eyes, ears, mouth and genitals (private parts). Wash Face and genitals (private parts)  with your normal soap.   Wash thoroughly, paying special attention to the area where your surgery will be performed.  Thoroughly rinse your body with warm water from the neck down.  DO NOT shower/wash with your normal soap after using and rinsing off the CHG Soap.  Pat yourself dry with a CLEAN TOWEL.  Wear CLEAN PAJAMAS to bed the night before surgery  Place CLEAN SHEETS on your bed the night before your surgery  DO NOT SLEEP WITH PETS.   Tuesday, Day of Surgery:  Take a shower with CHG soap. Wear Clean/Comfortable clothing the morning of surgery Do not apply any deodorants/lotions.   Remember to brush your teeth WITH YOUR REGULAR TOOTHPASTE.   Please read over the following fact sheets that you were given.

## 2020-12-06 ENCOUNTER — Encounter (HOSPITAL_COMMUNITY)
Admission: RE | Admit: 2020-12-06 | Discharge: 2020-12-06 | Disposition: A | Discharge status: Home / Self Care | Payer: Medicare Other | Source: Ambulatory Visit | Attending: Orthopaedic Surgery | Admitting: Orthopaedic Surgery

## 2020-12-06 ENCOUNTER — Other Ambulatory Visit: Payer: Self-pay

## 2020-12-06 ENCOUNTER — Encounter (HOSPITAL_COMMUNITY): Payer: Self-pay

## 2020-12-06 DIAGNOSIS — I4510 Unspecified right bundle-branch block: Secondary | ICD-10-CM | POA: Diagnosis not present

## 2020-12-06 DIAGNOSIS — Z01818 Encounter for other preprocedural examination: Secondary | ICD-10-CM | POA: Diagnosis not present

## 2020-12-06 HISTORY — DX: Prediabetes: R73.03

## 2020-12-06 HISTORY — DX: Sleep apnea, unspecified: G47.30

## 2020-12-06 LAB — BASIC METABOLIC PANEL
Anion gap: 10 (ref 5–15)
BUN: 8 mg/dL (ref 8–23)
CO2: 24 mmol/L (ref 22–32)
Calcium: 9.5 mg/dL (ref 8.9–10.3)
Chloride: 102 mmol/L (ref 98–111)
Creatinine, Ser: 0.75 mg/dL (ref 0.44–1.00)
GFR, Estimated: >60 mL/min (ref >60)
Glucose, Bld: 134 mg/dL — ABNORMAL HIGH (ref 70–99)
Potassium: 3.3 mmol/L — ABNORMAL LOW (ref 3.5–5.1)
Sodium: 136 mmol/L (ref 135–145)

## 2020-12-06 LAB — CBC
HCT: 41.3 % (ref 36.0–46.0)
Hemoglobin: 13.6 g/dL (ref 12.0–15.0)
MCH: 29.5 pg (ref 26.0–34.0)
MCHC: 32.9 g/dL (ref 30.0–36.0)
MCV: 89.6 fL (ref 80.0–100.0)
Platelets: 266 10*3/uL (ref 150–400)
RBC: 4.61 MIL/uL (ref 3.87–5.11)
RDW: 13.5 % (ref 11.5–15.5)
WBC: 7.3 10*3/uL (ref 4.0–10.5)
nRBC: 0 % (ref 0.0–0.2)

## 2020-12-06 LAB — GLUCOSE, CAPILLARY: Glucose-Capillary: 185 mg/dL — ABNORMAL HIGH (ref 70–99)

## 2020-12-06 LAB — TYPE AND SCREEN
ABO/RH(D): O POS
Antibody Screen: NEGATIVE

## 2020-12-06 LAB — SURGICAL PCR SCREEN
MRSA, PCR: NEGATIVE
Staphylococcus aureus: NEGATIVE

## 2020-12-06 NOTE — Pre-Procedure Instructions (Signed)
Surgical Instructions     Your procedure is scheduled on Tuesday, 12/17/20.  Report to [facility] Main Entrance "A" at 12:15 pm, then check in with the Admitting office.             Call this number if you have problems the morning of surgery:             [phone]    If you have any questions prior to your surgery date call [phone]: Open Monday-Friday 8am-4pm                 Remember:             Do not eat after midnight the night before your surgery - Monday   You may drink clear liquids until 11:15 am the morning of your surgery-Tuesday.   Clear liquids allowed are: Water, Non-Citrus Juices (without pulp), Carbonated Beverages, Clear Tea, Black Coffee Only, and Gatorade  Patient Instructions  The night before surgery:  No food after midnight. ONLY clear liquids after midnight   The day of surgery (if you have diabetes): Drink ONE (1) 12 oz G2 given to you in your pre admission testing appointment by 11:15 the morning of surgery. Drink in one sitting. Do not sip.  This drink was given to you during your hospital  pre-op appointment visit.  Nothing else to drink after completing the  12 oz bottle of G2.         If you have questions, please contact your surgeon's office.                           Take these medicines the morning of surgery with A SIP OF WATER:  atenolol (TENORMIN) acetaminophen (TYLENOL) if needed    As of today, STOP taking any Aspirin (unless otherwise instructed by your surgeon) Aleve, Naproxen, Ibuprofen, Motrin, Advil, Goody's, BC's, all herbal medications, fish oil, and all vitamins.  WHAT DO I DO ABOUT MY DIABETES MEDICATION?   Do not take metFORMIN (GLUCOPHAGE) the morning of surgery.    HOW TO MANAGE YOUR DIABETES BEFORE AND AFTER SURGERY  Why is it important to control my blood sugar before and after surgery? Improving blood sugar levels before and after surgery helps healing and can limit problems. A way of improving blood sugar  control is eating a healthy diet by:  Eating less sugar and carbohydrates  Increasing activity/exercise  Talking with your doctor about reaching your blood sugar goals High blood sugars (greater than 180 mg/dL) can raise your risk of infections and slow your recovery, so you will need to focus on controlling your diabetes during the weeks before surgery. Make sure that the doctor who takes care of your diabetes knows about your planned surgery including the date and location.  How do I manage my blood sugar before surgery? Check your blood sugar at least 4 times a day, starting 2 days before surgery, to make sure that the level is not too high or low.  Check your blood sugar the morning of your surgery when you wake up and every 2 hours until you get to the Short Stay unit.  If your blood sugar is less than 70 mg/dL, you will need to treat for low blood sugar: Do not take insulin. Treat a low blood sugar (less than 70 mg/dL) with  cup of clear juice (cranberry or apple), 4 glucose tablets, OR glucose gel. Recheck blood sugar in 15  minutes after treatment (to make sure it is greater than 70 mg/dL). If your blood sugar is not greater than 70 mg/dL on recheck, call [phone] for further instructions. Report your blood sugar to the short stay nurse when you get to Short Stay.  If you are admitted to the hospital after surgery: Your blood sugar will be checked by the staff and you will probably be given insulin after surgery (instead of oral diabetes medicines) to make sure you have good blood sugar levels. The goal for blood sugar control after surgery is 80-180 mg/dL.            Do not wear jewelry or makeup Do not wear lotions, powders, perfumes or deodorant. Do not shave 48 hours prior to surgery.   Do not bring valuables to the hospital.   [facility] is not responsible for any belongings or valuables. DO Not wear nail polish, gel polish, artificial nails, or any other type of covering  on natural nails including finger and toenails.If patients have artificial nails, gel coating, etc. that need to be removed by a nail salon please have this removed prior to surgery or surgery may need to be canceled/delayed if the surgeon/ anesthesia feels like the patient is unable to be adequately monitored.             Do NOT Smoke (Tobacco/Vaping) or drink Alcohol 24 hours prior to your procedure If you use a CPAP at night, you may bring all equipment for your overnight stay.   Contacts, glasses, dentures or bridgework may not be worn into surgery, please bring cases for these belongings   For patients admitted to the hospital, discharge time will be determined by your treatment team.     ONLY 1 SUPPORT PERSON MAY BE PRESENT WHILE YOU ARE IN SURGERY. IF YOU ARE TO BE ADMITTED ONCE YOU ARE IN YOUR ROOM YOU WILL BE ALLOWED TWO (2) VISITORS.  Minor children may have two parents present. Special consideration for safety and communication needs will be reviewed on a case by case basis.   Special instructions:     Oral Hygiene is also important to reduce your risk of infection.  Remember - BRUSH YOUR TEETH THE MORNING OF SURGERY WITH YOUR REGULAR TOOTHPASTE     [place]- Preparing For Surgery   Before surgery, you can play an important role. Because skin is not sterile, your skin needs to be as free of germs as possible. You can reduce the number of germs on your skin by washing with CHG (chlorahexidine gluconate) Soap before surgery.  CHG is an antiseptic cleaner which kills germs and bonds with the skin to continue killing germs even after washing.       Please do not use if you have an allergy to CHG or antibacterial soaps. If your skin becomes reddened/irritated stop using the CHG.  Do not shave (including legs and underarms) for at least 48 hours prior to first CHG shower. It is OK to shave your face.   Please follow these instructions carefully.  Shower the [employer] SURGERY-Monday and the MORNING OF SURGERY-Tuesday with CHG Soap.  If you chose to wash your hair, wash your hair first as usual with your normal shampoo. After you shampoo, rinse your hair and body thoroughly to remove the shampoo.  Then [employer] and genitals (private parts) with your normal soap and rinse thoroughly to remove soap.   After that Use CHG Soap as you would any other liquid soap. You can apply CHG directly to the skin and wash gently with a scrungie or a clean washcloth.    Apply the CHG Soap to your body ONLY FROM THE NECK DOWN.  Do not use on open wounds or open sores. Avoid contact with your eyes, ears, mouth and genitals (private parts). Wash Face and genitals (private parts)  with your normal soap.    Wash thoroughly, paying special attention to the area where your surgery will be performed.   Thoroughly rinse your body with warm water from the neck down.   DO NOT shower/wash with your normal soap after using and rinsing off the CHG Soap.   Pat yourself dry with a CLEAN TOWEL.   Wear CLEAN PAJAMAS to bed the night before surgery   Place CLEAN SHEETS on your bed the night before your surgery   DO NOT SLEEP WITH PETS.     Tuesday, Day of Surgery:   Take a shower with CHG soap. Wear Clean/Comfortable clothing the morning of surgery Do not apply any deodorants/lotions.   Remember to brush your teeth WITH YOUR REGULAR TOOTHPASTE.   Please read over the following fact sheets that you were given.

## 2020-12-06 NOTE — Progress Notes (Signed)
PCP - Dr. Delynn Flavin Cardiologist - denies  PPM/ICD - denies  Chest x-ray - 02/02/14 EKG - 12/06/20 at PAT appt Stress Test - denies ECHO - denies Cardiac Cath - denies  Sleep Study - 01/15/19 CPAP - compliant, uses at night  DM: prediabetic    ERAS Protcol - yes PRE-SURGERY  G2  COVID TEST- pt instructed to go to testing center on 12/13/20.   Anesthesia review: No  Patient denies shortness of breath, fever, cough and chest pain at PAT appointment   All instructions explained to the patient, with a verbal understanding of the material. Patient agrees to go over the instructions while at home for a better understanding. The opportunity to ask questions was provided.

## 2020-12-16 ENCOUNTER — Telehealth: Payer: Self-pay | Admitting: *Deleted

## 2020-12-16 NOTE — Care Plan (Signed)
RNCM call to patient to discuss her upcoming Right total hip arthroplasty with Dr. Magnus Ivan. She is an Ortho bundle patient through [facility] and is agreeable to case management. She lives with her husband and will have help from him and her daughter after discharge home. She has a FWW and 3in1/BSC. No DME will be needed. Anticipate HHPT will be needed after a short hospital stay. Choice provided and referral to [facility]. Reviewed all post op care instructions. Will follow for needs.

## 2020-12-16 NOTE — Telephone Encounter (Signed)
Ortho bundle pre-op call completed. 

## 2020-12-16 NOTE — Anesthesia Preprocedure Evaluation (Addendum)
Anesthesia Evaluation  Patient identified by MRN, date of birth, ID band Patient awake and Patient confused    Reviewed: Allergy & Precautions, NPO status , Patient's Chart, lab work & pertinent test results, reviewed documented beta blocker date and time   Airway Mallampati: II       Dental  (+) Poor Dentition, Missing   Pulmonary sleep apnea and Continuous Positive Airway Pressure Ventilation , Current Smoker and Patient abstained from smoking.,    Pulmonary exam normal        Cardiovascular hypertension, Pt. on medications and Pt. on home beta blockers Normal cardiovascular exam     Neuro/Psych  Neuromuscular disease negative psych ROS   GI/Hepatic negative GI ROS, Neg liver ROS,   Endo/Other  diabetes, Type 2, Oral Hypoglycemic Agents  Renal/GU negative Renal ROS  negative genitourinary   Musculoskeletal  (+) Arthritis , Osteoarthritis,    Abdominal (+) + obese,   Peds  Hematology negative hematology ROS (+)   Anesthesia Other Findings All, oxycodone hydrocodone  Reproductive/Obstetrics                            Anesthesia Physical Anesthesia Plan  ASA: 3  Anesthesia Plan: Spinal   Post-op Pain Management:    Induction:   PONV Risk Score and Plan: 2 and Treatment may vary due to age or medical condition, Midazolam and Ondansetron  Airway Management Planned: Simple Face Mask and Natural Airway  Additional Equipment: None  Intra-op Plan:   Post-operative Plan:   Informed Consent: I have reviewed the patients History and Physical, chart, labs and discussed the procedure including the risks, benefits and alternatives for the proposed anesthesia with the patient or authorized representative who has indicated his/her understanding and acceptance.     Dental advisory given  Plan Discussed with: CRNA  Anesthesia Plan Comments: (Spi)       Anesthesia Quick Evaluation

## 2020-12-17 ENCOUNTER — Other Ambulatory Visit: Payer: Self-pay

## 2020-12-17 ENCOUNTER — Ambulatory Visit (HOSPITAL_COMMUNITY): Payer: Medicare Other | Admitting: Anesthesiology

## 2020-12-17 ENCOUNTER — Observation Stay (HOSPITAL_COMMUNITY): Payer: Medicare Other

## 2020-12-17 ENCOUNTER — Encounter (HOSPITAL_COMMUNITY)
Admission: RE | Disposition: A | Discharge status: Home / Self Care | Payer: Self-pay | Source: Home / Self Care | Attending: Orthopaedic Surgery

## 2020-12-17 ENCOUNTER — Ambulatory Visit (HOSPITAL_COMMUNITY): Payer: Medicare Other

## 2020-12-17 ENCOUNTER — Encounter (HOSPITAL_COMMUNITY): Payer: Self-pay | Admitting: Orthopaedic Surgery

## 2020-12-17 ENCOUNTER — Observation Stay (HOSPITAL_COMMUNITY)
Admission: RE | Admit: 2020-12-17 | Discharge: 2020-12-19 | Disposition: A | Discharge status: Home / Self Care | Payer: Medicare Other | Attending: Orthopaedic Surgery | Admitting: Orthopaedic Surgery

## 2020-12-17 DIAGNOSIS — Z9989 Dependence on other enabling machines and devices: Secondary | ICD-10-CM | POA: Diagnosis not present

## 2020-12-17 DIAGNOSIS — Z419 Encounter for procedure for purposes other than remedying health state, unspecified: Secondary | ICD-10-CM

## 2020-12-17 DIAGNOSIS — I10 Essential (primary) hypertension: Secondary | ICD-10-CM | POA: Insufficient documentation

## 2020-12-17 DIAGNOSIS — Z96641 Presence of right artificial hip joint: Secondary | ICD-10-CM | POA: Diagnosis not present

## 2020-12-17 DIAGNOSIS — F17210 Nicotine dependence, cigarettes, uncomplicated: Secondary | ICD-10-CM | POA: Diagnosis not present

## 2020-12-17 DIAGNOSIS — E119 Type 2 diabetes mellitus without complications: Secondary | ICD-10-CM | POA: Diagnosis not present

## 2020-12-17 DIAGNOSIS — M1611 Unilateral primary osteoarthritis, right hip: Principal | ICD-10-CM

## 2020-12-17 DIAGNOSIS — Z96651 Presence of right artificial knee joint: Secondary | ICD-10-CM | POA: Diagnosis not present

## 2020-12-17 DIAGNOSIS — E785 Hyperlipidemia, unspecified: Secondary | ICD-10-CM | POA: Diagnosis not present

## 2020-12-17 DIAGNOSIS — Z20822 Contact with and (suspected) exposure to covid-19: Secondary | ICD-10-CM | POA: Diagnosis not present

## 2020-12-17 DIAGNOSIS — Z471 Aftercare following joint replacement surgery: Secondary | ICD-10-CM | POA: Diagnosis not present

## 2020-12-17 DIAGNOSIS — G4733 Obstructive sleep apnea (adult) (pediatric): Secondary | ICD-10-CM | POA: Diagnosis not present

## 2020-12-17 HISTORY — PX: TOTAL HIP ARTHROPLASTY: SHX124

## 2020-12-17 LAB — GLUCOSE, CAPILLARY
Glucose-Capillary: 115 mg/dL — ABNORMAL HIGH (ref 70–99)
Glucose-Capillary: 77 mg/dL (ref 70–99)

## 2020-12-17 LAB — SARS CORONAVIRUS 2 BY RT PCR (HOSPITAL ORDER, PERFORMED IN ~~LOC~~ HOSPITAL LAB): SARS Coronavirus 2: NEGATIVE

## 2020-12-17 LAB — ABO/RH: ABO/RH(D): O POS

## 2020-12-17 SURGERY — ARTHROPLASTY, HIP, TOTAL, ANTERIOR APPROACH
Anesthesia: Spinal | Site: Hip | Laterality: Right

## 2020-12-17 MED ORDER — PROPOFOL 500 MG/50ML IV EMUL
INTRAVENOUS | Status: DC | PRN
  Administered 2020-12-17: 50 ug/kg/min via INTRAVENOUS

## 2020-12-17 MED ORDER — ACETAMINOPHEN 325 MG PO TABS
325.0000 mg | ORAL_TABLET | Freq: Four times a day (QID) | ORAL | Status: DC | PRN
  Administered 2020-12-18 – 2020-12-19 (×2): 650 mg via ORAL
  Filled 2020-12-17 (×2): qty 2

## 2020-12-17 MED ORDER — DOCUSATE SODIUM 100 MG PO CAPS
100.0000 mg | ORAL_CAPSULE | Freq: Two times a day (BID) | ORAL | Status: DC
  Administered 2020-12-17 – 2020-12-19 (×4): 100 mg via ORAL
  Filled 2020-12-17 (×4): qty 1

## 2020-12-17 MED ORDER — PROPOFOL 10 MG/ML IV BOLUS
INTRAVENOUS | Status: AC
  Filled 2020-12-17: qty 20

## 2020-12-17 MED ORDER — LACTATED RINGERS IV SOLN: INTRAVENOUS | Status: DC

## 2020-12-17 MED ORDER — POLYETHYLENE GLYCOL 3350 17 G PO PACK: 17.0000 g | PACK | Freq: Every day | ORAL | Status: DC | PRN

## 2020-12-17 MED ORDER — METOCLOPRAMIDE HCL 5 MG PO TABS: 5.0000 mg | ORAL_TABLET | Freq: Three times a day (TID) | ORAL | Status: DC | PRN

## 2020-12-17 MED ORDER — CEFAZOLIN SODIUM 1 G IJ SOLR
INTRAMUSCULAR | Status: AC
  Filled 2020-12-17: qty 10

## 2020-12-17 MED ORDER — EPHEDRINE SULFATE-NACL 50-0.9 MG/10ML-% IV SOSY
PREFILLED_SYRINGE | INTRAVENOUS | Status: DC | PRN
  Administered 2020-12-17: 10 mg via INTRAVENOUS

## 2020-12-17 MED ORDER — EPHEDRINE 5 MG/ML INJ
INTRAVENOUS | Status: AC
  Filled 2020-12-17: qty 5

## 2020-12-17 MED ORDER — TRIAMTERENE-HCTZ 37.5-25 MG PO TABS
1.0000 | ORAL_TABLET | Freq: Every day | ORAL | Status: DC
  Administered 2020-12-18 – 2020-12-19 (×2): 1 via ORAL
  Filled 2020-12-17 (×3): qty 1

## 2020-12-17 MED ORDER — CEFAZOLIN SODIUM-DEXTROSE 2-4 GM/100ML-% IV SOLN
2.0000 g | INTRAVENOUS | Status: AC
  Administered 2020-12-17: 2 g via INTRAVENOUS
  Filled 2020-12-17: qty 100

## 2020-12-17 MED ORDER — METHOCARBAMOL 500 MG PO TABS
ORAL_TABLET | ORAL | Status: AC
  Filled 2020-12-17: qty 1

## 2020-12-17 MED ORDER — CEFAZOLIN SODIUM-DEXTROSE 1-4 GM/50ML-% IV SOLN
INTRAVENOUS | Status: DC | PRN
  Administered 2020-12-17: 1 g via INTRAVENOUS

## 2020-12-17 MED ORDER — ONDANSETRON HCL 4 MG/2ML IJ SOLN: 4.0000 mg | Freq: Once | INTRAMUSCULAR | Status: DC | PRN

## 2020-12-17 MED ORDER — MIDAZOLAM HCL 2 MG/2ML IJ SOLN
INTRAMUSCULAR | Status: AC
  Filled 2020-12-17: qty 2

## 2020-12-17 MED ORDER — FENTANYL CITRATE (PF) 100 MCG/2ML IJ SOLN
INTRAMUSCULAR | Status: DC | PRN
  Administered 2020-12-17 (×2): 50 ug via INTRAVENOUS

## 2020-12-17 MED ORDER — COENZYME Q-10 100 MG PO CAPS: 100.0000 mg | ORAL_CAPSULE | Freq: Every day | ORAL | Status: DC

## 2020-12-17 MED ORDER — METHOCARBAMOL 1000 MG/10ML IJ SOLN
500.0000 mg | Freq: Four times a day (QID) | INTRAVENOUS | Status: DC | PRN
  Filled 2020-12-17: qty 5

## 2020-12-17 MED ORDER — FENTANYL CITRATE (PF) 100 MCG/2ML IJ SOLN
INTRAMUSCULAR | Status: AC
  Filled 2020-12-17: qty 2

## 2020-12-17 MED ORDER — OXYCODONE HCL 5 MG/5ML PO SOLN: 5.0000 mg | Freq: Once | ORAL | Status: DC | PRN

## 2020-12-17 MED ORDER — FENTANYL CITRATE (PF) 100 MCG/2ML IJ SOLN: 50.0000 ug | Freq: Once | INTRAMUSCULAR | Status: DC

## 2020-12-17 MED ORDER — SODIUM CHLORIDE 0.9 % IV SOLN: INTRAVENOUS | Status: DC

## 2020-12-17 MED ORDER — OXYCODONE HCL 5 MG PO TABS
5.0000 mg | ORAL_TABLET | ORAL | Status: DC | PRN
  Administered 2020-12-18 (×3): 10 mg via ORAL
  Filled 2020-12-17 (×2): qty 2

## 2020-12-17 MED ORDER — SODIUM CHLORIDE 0.9 % IR SOLN
Status: DC | PRN
  Administered 2020-12-17: 1000 mL

## 2020-12-17 MED ORDER — MENTHOL 3 MG MT LOZG: 1.0000 | LOZENGE | OROMUCOSAL | Status: DC | PRN

## 2020-12-17 MED ORDER — HYDROMORPHONE HCL 1 MG/ML IJ SOLN
0.5000 mg | INTRAMUSCULAR | Status: DC | PRN
  Administered 2020-12-17 – 2020-12-18 (×3): 1 mg via INTRAVENOUS
  Filled 2020-12-17 (×3): qty 1

## 2020-12-17 MED ORDER — PROPOFOL 10 MG/ML IV BOLUS
INTRAVENOUS | Status: DC | PRN
  Administered 2020-12-17 (×3): 20 mg via INTRAVENOUS
  Administered 2020-12-17: 30 mg via INTRAVENOUS
  Administered 2020-12-17: 50 mg via INTRAVENOUS
  Administered 2020-12-17: 20 mg via INTRAVENOUS

## 2020-12-17 MED ORDER — ATENOLOL 25 MG PO TABS
25.0000 mg | ORAL_TABLET | Freq: Every day | ORAL | Status: DC
  Administered 2020-12-18 – 2020-12-19 (×2): 25 mg via ORAL
  Filled 2020-12-17 (×2): qty 1

## 2020-12-17 MED ORDER — MIDAZOLAM HCL 5 MG/5ML IJ SOLN
INTRAMUSCULAR | Status: DC | PRN
  Administered 2020-12-17: 2 mg via INTRAVENOUS

## 2020-12-17 MED ORDER — POVIDONE-IODINE 10 % EX SWAB
2.0000 | Freq: Once | CUTANEOUS | Status: AC
Start: 2020-12-17 — End: 2020-12-17
  Administered 2020-12-17: 2 via TOPICAL

## 2020-12-17 MED ORDER — ALUM & MAG HYDROXIDE-SIMETH 200-200-20 MG/5ML PO SUSP: 30.0000 mL | ORAL | Status: DC | PRN

## 2020-12-17 MED ORDER — DIPHENHYDRAMINE HCL 12.5 MG/5ML PO ELIX: 12.5000 mg | ORAL_SOLUTION | ORAL | Status: DC | PRN

## 2020-12-17 MED ORDER — HYDROMORPHONE HCL 1 MG/ML IJ SOLN
INTRAMUSCULAR | Status: AC
  Filled 2020-12-17: qty 1

## 2020-12-17 MED ORDER — PHENYLEPHRINE HCL (PRESSORS) 10 MG/ML IV SOLN
INTRAVENOUS | Status: DC | PRN
  Administered 2020-12-17: 160 ug via INTRAVENOUS

## 2020-12-17 MED ORDER — METOCLOPRAMIDE HCL 5 MG/ML IJ SOLN: 5.0000 mg | Freq: Three times a day (TID) | INTRAMUSCULAR | Status: DC | PRN

## 2020-12-17 MED ORDER — BUPIVACAINE IN DEXTROSE 0.75-8.25 % IT SOLN
INTRATHECAL | Status: DC | PRN
  Administered 2020-12-17: 1.6 mL via INTRATHECAL

## 2020-12-17 MED ORDER — HYDROMORPHONE HCL 1 MG/ML IJ SOLN
0.2500 mg | INTRAMUSCULAR | Status: DC | PRN
  Administered 2020-12-17: 0.25 mg via INTRAVENOUS
  Administered 2020-12-17: 0.5 mg via INTRAVENOUS
  Administered 2020-12-17: 0.25 mg via INTRAVENOUS

## 2020-12-17 MED ORDER — GLYCOPYRROLATE PF 0.2 MG/ML IJ SOSY
PREFILLED_SYRINGE | INTRAMUSCULAR | Status: DC | PRN
  Administered 2020-12-17: .2 mg via INTRAVENOUS

## 2020-12-17 MED ORDER — ONDANSETRON HCL 4 MG/2ML IJ SOLN
INTRAMUSCULAR | Status: AC
  Filled 2020-12-17: qty 2

## 2020-12-17 MED ORDER — CHLORHEXIDINE GLUCONATE 0.12 % MT SOLN
15.0000 mL | Freq: Once | OROMUCOSAL | Status: AC
  Administered 2020-12-17: 15 mL via OROMUCOSAL
  Filled 2020-12-17: qty 15

## 2020-12-17 MED ORDER — AMISULPRIDE (ANTIEMETIC) 5 MG/2ML IV SOLN
10.0000 mg | Freq: Once | INTRAVENOUS | Status: DC | PRN
Start: 2020-12-17 — End: 2020-12-17

## 2020-12-17 MED ORDER — PHENYLEPHRINE HCL-NACL 20-0.9 MG/250ML-% IV SOLN
INTRAVENOUS | Status: DC | PRN
Start: 2020-12-17 — End: 2020-12-17
  Administered 2020-12-17: 80 ug/min via INTRAVENOUS

## 2020-12-17 MED ORDER — OXYCODONE HCL 5 MG PO TABS: 5.0000 mg | ORAL_TABLET | Freq: Once | ORAL | Status: DC | PRN

## 2020-12-17 MED ORDER — ORAL CARE MOUTH RINSE: 15.0000 mL | Freq: Once | OROMUCOSAL | Status: AC

## 2020-12-17 MED ORDER — ATORVASTATIN CALCIUM 40 MG PO TABS
40.0000 mg | ORAL_TABLET | Freq: Every day | ORAL | Status: DC
  Administered 2020-12-17 – 2020-12-18 (×2): 40 mg via ORAL
  Filled 2020-12-17 (×2): qty 1

## 2020-12-17 MED ORDER — OXYCODONE HCL 5 MG PO TABS
10.0000 mg | ORAL_TABLET | ORAL | Status: DC | PRN
  Administered 2020-12-17: 15 mg via ORAL
  Administered 2020-12-19: 10 mg via ORAL
  Filled 2020-12-17: qty 3
  Filled 2020-12-17 (×2): qty 2

## 2020-12-17 MED ORDER — CEFAZOLIN SODIUM-DEXTROSE 1-4 GM/50ML-% IV SOLN
1.0000 g | Freq: Four times a day (QID) | INTRAVENOUS | Status: AC
Start: 2020-12-17 — End: 2020-12-18
  Administered 2020-12-17 – 2020-12-18 (×2): 1 g via INTRAVENOUS
  Filled 2020-12-17 (×2): qty 50

## 2020-12-17 MED ORDER — METFORMIN HCL 500 MG PO TABS
500.0000 mg | ORAL_TABLET | Freq: Every day | ORAL | Status: DC
  Administered 2020-12-18 – 2020-12-19 (×2): 500 mg via ORAL
  Filled 2020-12-17 (×2): qty 1

## 2020-12-17 MED ORDER — FENTANYL CITRATE (PF) 100 MCG/2ML IJ SOLN
25.0000 ug | INTRAMUSCULAR | Status: DC | PRN
  Administered 2020-12-17 (×4): 50 ug via INTRAVENOUS

## 2020-12-17 MED ORDER — ONDANSETRON HCL 4 MG/2ML IJ SOLN: 4.0000 mg | Freq: Four times a day (QID) | INTRAMUSCULAR | Status: DC | PRN

## 2020-12-17 MED ORDER — FENTANYL CITRATE (PF) 250 MCG/5ML IJ SOLN
INTRAMUSCULAR | Status: AC
  Filled 2020-12-17: qty 5

## 2020-12-17 MED ORDER — ONDANSETRON HCL 4 MG PO TABS: 4.0000 mg | ORAL_TABLET | Freq: Four times a day (QID) | ORAL | Status: DC | PRN

## 2020-12-17 MED ORDER — ACETAMINOPHEN 10 MG/ML IV SOLN
1000.0000 mg | Freq: Once | INTRAVENOUS | Status: DC | PRN
  Administered 2020-12-17: 1000 mg via INTRAVENOUS

## 2020-12-17 MED ORDER — ASPIRIN 81 MG PO CHEW
81.0000 mg | CHEWABLE_TABLET | Freq: Two times a day (BID) | ORAL | Status: DC
  Administered 2020-12-17 – 2020-12-19 (×4): 81 mg via ORAL
  Filled 2020-12-17 (×4): qty 1

## 2020-12-17 MED ORDER — ACETAMINOPHEN 10 MG/ML IV SOLN
INTRAVENOUS | Status: AC
  Filled 2020-12-17: qty 100

## 2020-12-17 MED ORDER — 0.9 % SODIUM CHLORIDE (POUR BTL) OPTIME
TOPICAL | Status: DC | PRN
  Administered 2020-12-17: 1000 mL

## 2020-12-17 MED ORDER — PHENOL 1.4 % MT LIQD: 1.0000 | OROMUCOSAL | Status: DC | PRN

## 2020-12-17 MED ORDER — GLYCOPYRROLATE PF 0.2 MG/ML IJ SOSY
PREFILLED_SYRINGE | INTRAMUSCULAR | Status: AC
  Filled 2020-12-17: qty 1

## 2020-12-17 MED ORDER — METHOCARBAMOL 500 MG PO TABS
500.0000 mg | ORAL_TABLET | Freq: Four times a day (QID) | ORAL | Status: DC | PRN
  Administered 2020-12-17 – 2020-12-18 (×2): 500 mg via ORAL
  Filled 2020-12-17: qty 1

## 2020-12-17 MED ORDER — PANTOPRAZOLE SODIUM 40 MG PO TBEC
40.0000 mg | DELAYED_RELEASE_TABLET | Freq: Every day | ORAL | Status: DC
  Administered 2020-12-18 – 2020-12-19 (×2): 40 mg via ORAL
  Filled 2020-12-17 (×2): qty 1

## 2020-12-17 MED ORDER — TRANEXAMIC ACID-NACL 1000-0.7 MG/100ML-% IV SOLN
1000.0000 mg | INTRAVENOUS | Status: AC
  Administered 2020-12-17: 1000 mg via INTRAVENOUS
  Filled 2020-12-17: qty 100

## 2020-12-17 SURGICAL SUPPLY — 54 items
BAG COUNTER SPONGE SURGICOUNT (BAG) ×2 IMPLANT
BENZOIN TINCTURE PRP APPL 2/3 (GAUZE/BANDAGES/DRESSINGS) ×2 IMPLANT
BLADE CLIPPER SURG (BLADE) IMPLANT
BLADE SAW SGTL 18X1.27X75 (BLADE) ×2 IMPLANT
COVER SURGICAL LIGHT HANDLE (MISCELLANEOUS) ×2 IMPLANT
CUP SECTOR GRIPTON 50MM (Cup) ×2 IMPLANT
DRAPE C-ARM 42X72 X-RAY (DRAPES) ×2 IMPLANT
DRAPE STERI IOBAN 125X83 (DRAPES) ×2 IMPLANT
DRAPE U-SHAPE 47X51 STRL (DRAPES) ×6 IMPLANT
DRSG AQUACEL AG ADV 3.5X10 (GAUZE/BANDAGES/DRESSINGS) ×2 IMPLANT
DRSG AQUACEL AG ADV 3.5X14 (GAUZE/BANDAGES/DRESSINGS) ×2 IMPLANT
DURAPREP 26ML APPLICATOR (WOUND CARE) ×2 IMPLANT
ELECT BLADE 4.0 EZ CLEAN MEGAD (MISCELLANEOUS) ×2
ELECT BLADE 6.5 EXT (BLADE) IMPLANT
ELECT REM PT RETURN 9FT ADLT (ELECTROSURGICAL) ×2
ELECTRODE BLDE 4.0 EZ CLN MEGD (MISCELLANEOUS) ×1 IMPLANT
ELECTRODE REM PT RTRN 9FT ADLT (ELECTROSURGICAL) ×1 IMPLANT
FACESHIELD WRAPAROUND (MASK) ×4 IMPLANT
GLOVE SRG 8 PF TXTR STRL LF DI (GLOVE) ×2 IMPLANT
GLOVE SURG LTX SZ8 (GLOVE) ×2 IMPLANT
GLOVE SURG ORTHO LTX SZ7.5 (GLOVE) ×4 IMPLANT
GLOVE SURG UNDER POLY LF SZ8 (GLOVE) ×4
GOWN STRL REUS W/ TWL LRG LVL3 (GOWN DISPOSABLE) ×2 IMPLANT
GOWN STRL REUS W/ TWL XL LVL3 (GOWN DISPOSABLE) ×2 IMPLANT
GOWN STRL REUS W/TWL LRG LVL3 (GOWN DISPOSABLE) ×4
GOWN STRL REUS W/TWL XL LVL3 (GOWN DISPOSABLE) ×4
HANDPIECE INTERPULSE COAX TIP (DISPOSABLE) ×2
HEAD FEMORAL 32 CERAMIC (Hips) ×2 IMPLANT
KIT BASIN OR (CUSTOM PROCEDURE TRAY) ×2 IMPLANT
KIT TURNOVER KIT B (KITS) ×2 IMPLANT
LINER ACET PNNCL PLUS4 NEUTRAL (Hips) ×1 IMPLANT
MANIFOLD NEPTUNE II (INSTRUMENTS) ×2 IMPLANT
NS IRRIG 1000ML POUR BTL (IV SOLUTION) ×2 IMPLANT
PACK TOTAL JOINT (CUSTOM PROCEDURE TRAY) ×2 IMPLANT
PAD ARMBOARD 7.5X6 YLW CONV (MISCELLANEOUS) ×2 IMPLANT
PINNACLE PLUS 4 NEUTRAL (Hips) ×2 IMPLANT
SET HNDPC FAN SPRY TIP SCT (DISPOSABLE) ×1 IMPLANT
STAPLER VISISTAT 35W (STAPLE) ×2 IMPLANT
STEM CORAIL KA10 (Stem) ×2 IMPLANT
STRIP CLOSURE SKIN 1/2X4 (GAUZE/BANDAGES/DRESSINGS) ×4 IMPLANT
SUT ETHIBOND NAB CT1 #1 30IN (SUTURE) ×2 IMPLANT
SUT MNCRL AB 4-0 PS2 18 (SUTURE) IMPLANT
SUT VIC AB 0 CT1 27 (SUTURE) ×2
SUT VIC AB 0 CT1 27XBRD ANBCTR (SUTURE) ×1 IMPLANT
SUT VIC AB 1 CT1 27 (SUTURE) ×2
SUT VIC AB 1 CT1 27XBRD ANBCTR (SUTURE) ×1 IMPLANT
SUT VIC AB 2-0 CT1 27 (SUTURE) ×2
SUT VIC AB 2-0 CT1 TAPERPNT 27 (SUTURE) ×1 IMPLANT
TOWEL GREEN STERILE (TOWEL DISPOSABLE) ×2 IMPLANT
TOWEL GREEN STERILE FF (TOWEL DISPOSABLE) ×2 IMPLANT
TRAY CATH 16FR W/PLASTIC CATH (SET/KITS/TRAYS/PACK) IMPLANT
TRAY FOLEY W/BAG SLVR 16FR (SET/KITS/TRAYS/PACK)
TRAY FOLEY W/BAG SLVR 16FR ST (SET/KITS/TRAYS/PACK) IMPLANT
WATER STERILE IRR 1000ML POUR (IV SOLUTION) ×4 IMPLANT

## 2020-12-17 NOTE — Transfer of Care (Signed)
Immediate Anesthesia Transfer of Care Note  Patient: Jennifer [name]  Procedure(s) Performed: RIGHT TOTAL HIP ARTHROPLASTY ANTERIOR APPROACH (Right: Hip)  Patient Location: PACU  Anesthesia Type:MAC and Spinal  Level of Consciousness: drowsy, patient cooperative and responds to stimulation  Airway & Oxygen Therapy: Patient Spontanous Breathing and Patient connected to nasal cannula oxygen  Post-op Assessment: Report given to RN and Post -op Vital signs reviewed and stable  Post vital signs: Reviewed and stable  Last Vitals:  Vitals Value Taken Time  BP    Temp    Pulse 75 12/17/20 1608  Resp 23 12/17/20 1608  SpO2 97 % 12/17/20 1608  Vitals shown include unvalidated device data.  Last Pain:  Vitals:   12/17/20 1220  TempSrc:   PainSc: 10-Worst pain ever      Patients Stated Pain Goal: 2 (02/33/43 5686)  Complications: No notable events documented.

## 2020-12-17 NOTE — Brief Op Note (Signed)
12/17/2020  3:45 PM  PATIENT:  Jennifer [name]  69 y.o. female  PRE-OPERATIVE DIAGNOSIS:  right hip osteoarthritis  POST-OPERATIVE DIAGNOSIS:  right hip osteoarthritis  PROCEDURE:  Procedure(s): RIGHT TOTAL HIP ARTHROPLASTY ANTERIOR APPROACH (Right)  SURGEON:  Surgeon(s) and Role:    Kathryne Hitch, MD - Primary  PHYSICIAN ASSISTANT:  Rexene Edison, PA-C  ANESTHESIA:   spinal  EBL:  250 mL   COUNTS:  YES  DICTATION: .Other Dictation: Dictation Number [id number]  PLAN OF CARE: Admit for overnight observation  PATIENT DISPOSITION:  PACU - hemodynamically stable.   Delay start of Pharmacological VTE agent (>24hrs) due to surgical blood loss or risk of bleeding: no

## 2020-12-17 NOTE — Anesthesia Procedure Notes (Signed)
Spinal  Patient location during procedure: OR Start time: 12/17/2020 2:20 PM End time: 12/17/2020 2:27 PM Reason for block: surgical anesthesia Staffing Performed: anesthesiologist  Anesthesiologist: Leilani Able, MD Preanesthetic Checklist Completed: patient identified, IV checked, site marked, risks and benefits discussed, surgical consent, monitors and equipment checked, pre-op evaluation and timeout performed Spinal Block Patient position: sitting Prep: DuraPrep and site prepped and draped Patient monitoring: continuous pulse ox and blood pressure Approach: midline Location: L3-4 Injection technique: single-shot Needle Needle type: Pencan  Needle gauge: 24 G Needle length: 9 cm Needle insertion depth: 6 cm Assessment Sensory level: T6 Events: CSF return

## 2020-12-17 NOTE — Op Note (Signed)
NAME: Jennifer [name], Jennifer W. MEDICAL RECORD NO: [id number] ACCOUNT NO: [id number] DATE OF BIRTH: 12-13-1950 FACILITY: [facility] LOCATION: [facility] PHYSICIAN: Vanita Panda. Magnus Ivan, MD  Operative Report   DATE OF PROCEDURE: 12/17/2020  PREOPERATIVE DIAGNOSES:  Primary osteoarthritis and degenerative joint disease, right hip.  POSTOPERATIVE DIAGNOSES:  Primary osteoarthritis and degenerative joint disease, right hip.  PROCEDURE:  Right total hip arthroplasty through direct anterior approach.  IMPLANTS:  DePuy Sector Gription acetabular component size 50, size 32+4 neutral polyethylene liner, size 10 Corail femoral component with standard offset, size 32+1 ceramic hip ball.  SURGEON:  Vanita Panda. Magnus Ivan, MD  ASSISTANT: Richardean Canal, PA-C  ANESTHESIA:  Spinal.  ANTIBIOTICS:  3 grams IV Ancef.  BLOOD LOSS:  250 mL  COMPLICATIONS:  None.  INDICATIONS:  The patient is a 70 year old female with a BMI of 37, who also has severe end-stage arthritis of her right hip.  She has tried and failed all forms of conservative treatment.  At this point, her right hip pain is daily and it is  detrimentally affecting her mobility, her quality of life and activities of daily living to the point she does wish to proceed with total hip arthroplasty on the right side and we agree with this as well.  She understands with her obesity, there is a  certainly heightened risk of acute blood loss anemia, nerve and vessel injury, fracture, infection, dislocation, DVT, implant failure and skin and soft tissue issues.  She understands our goals are to decrease pain, improve mobility and overall improve  quality of life.  DESCRIPTION OF PROCEDURE:  After informed consent was obtained, appropriate right hip was marked.  She was brought to the operating room and sat up on the stretcher where spinal anesthesia was obtained.  She was laid in the supine position on the  stretcher.  We assessed her leg lengths and she is  definitely shorter on the right operative side than the left.  Foley catheter was placed and traction boots were placed on both her feet.  Next, she was placed supine on the Hana fracture table, the  perineal post in place and both legs in line skeletal traction device and no traction applied.  Her right operative hip was prepped and draped with DuraPrep and sterile drapes.  A timeout was called.  She was identified correct patient, correct right  hip.  We then made an incision just inferior and posterior to the anterior superior iliac spine and carried this obliquely down the leg.  We dissected down tensor fascia lata muscle.  Tensor fascia was then divided longitudinally to proceed with direct  anterior approach to the hip.  We identified and cauterized circumflex vessels, then identified the hip capsule, opened up the hip capsule in L-type format, finding a moderate joint effusion and significant periarticular osteophytes around the lateral  femoral head and neck, with Cobra retractors around the lateral femoral neck, within the joint capsule,  we made a femoral neck cut just proximal to the lesser trochanter with an oscillating saw and completed this with an osteotome.  We placed a  corkscrew guide in the femoral head and removed the femoral head in its entirety and found a wide area devoid of cartilage.  I then placed a bent Hohmann over the medial acetabular rim and removed remnants of the acetabular labrum and other debris.  We  then began reaming under direct visualization from a size 43 reamer in stepwise increments going up to a size 49, with all reamers placed  under direct visualization, the last reamer was placed under direct fluoroscopy, so we could obtain our depth in  reaming, our inclination and anteversion.  I then placed a real DePuy sector Gription acetabular component size 50 and had a nice tight fit.  We went with a 32+4 neutral polyethylene liner for that size acetabular component.   Attention was then turned to  the femur with the leg externally rotated to 120 degrees, extended and adducted. We are to place a Mueller retractor medially and Hohmann retractor behind the greater trochanter.  We released lateral joint capsule and used a box cutting osteotome to  enter the femoral canal and a rongeur to lateralize, then began broaching using the Corail broaching system from a size 8 going up to a size 10. With a size 10 in place, we trialed standard offset femoral neck and a 32+1 trial hip ball, reduced this in  the acetabulum and we were pleased with stability, assessed mechanically and clinically as well as range of motion, offset and leg length.  We then dislocated the hip, removed the trial components.  We placed the real Corail femoral component with  standard offset size 10 and the real 32+1 ceramic hip ball and again reduced this in the acetabulum and it was felt to be stable mechanically and radiographically.  We then irrigated the soft tissue with normal saline solution using pulsatile lavage.  We  were able to close the joint capsule with interrupted #1 Ethibond suture followed by running #1 Vicryl to close the tensor fascia.  0 Vicryl was used to close the deep tissue and 2-0 Vicryl was used to close the subcutaneous tissue.  The skin was  reapproximated with staples.  An Aquacel dressing was applied.  She was taken off the Hana table and taken to recovery room in stable condition with all final counts being correct.  There were no complications noted.  Of note, Rexene Edison, PA-C, assisted  during the entire case and assistance was crucial for facilitating every aspect of this case.   SHW D: 12/17/2020 3:44:00 pm T: 12/17/2020 10:08:00 pm  JOB: [id number]/ [id number]

## 2020-12-17 NOTE — H&P (Signed)
TOTAL HIP ADMISSION H&P  Patient is admitted for right total hip arthroplasty.  Subjective:  Chief Complaint: right hip pain  HPI: Jennifer [name], 70 y.o. female, has a history of pain and functional disability in the right hip(s) due to arthritis and patient has failed non-surgical conservative treatments for greater than 12 weeks to include NSAID's and/or analgesics, corticosteriod injections, flexibility and strengthening excercises, use of assistive devices, weight reduction as appropriate, and activity modification.  Onset of symptoms was gradual starting 3 years ago with gradually worsening course since that time.The patient noted no past surgery on the right hip(s).  Patient currently rates pain in the right hip at 10 out of 10 with activity. Patient has night pain, worsening of pain with activity and weight bearing, pain that interfers with activities of daily living, and pain with passive range of motion. Patient has evidence of subchondral cysts, subchondral sclerosis, periarticular osteophytes, and joint space narrowing by imaging studies. This condition presents safety issues increasing the risk of falls.  There is no current active infection.  Patient Active Problem List   Diagnosis Date Noted   Unilateral primary osteoarthritis, right hip 12/17/2020   Hip pain 07/24/2020   OSA on CPAP 04/04/2019   Peripheral arterial disease (HCC) 09/21/2017   Type 2 diabetes, with peripheral circulatory disorder not at goal [facility]) 08/16/2017   Chronic pain of left ankle 08/11/2017   Bilateral carpal tunnel syndrome 08/11/2017   Hypertension associated with diabetes (HCC) 08/11/2017   Morbid obesity (HCC) 08/11/2017   Localized primary osteoarthritis of left lower leg 08/11/2017   Hyperlipidemia associated with type 2 diabetes mellitus (HCC) 08/11/2017   Tobacco use disorder 08/11/2017   History of adenomatous polyp of colon 07/23/2016   S/P total knee arthroplasty 02/12/2014   Past Medical  History:  Diagnosis Date   Arthritis    Dyspnea    Hyperlipidemia    Hypertension    Pre-diabetes    Sleep apnea     Past Surgical History:  Procedure Laterality Date   ABDOMINAL HYSTERECTOMY     COLONOSCOPY N/A 11/25/2016   Procedure: COLONOSCOPY;  Surgeon: Malissa Hippo, MD;  Location: [facility];  Service: Endoscopy;  Laterality: N/A;  830   EYE SURGERY     FOOT SURGERY Left 2018   reconstructed tendon   KNEE ARTHROSCOPY     BIL   2010    LUNG BIOPSY     RIGHT 30 YRS AGO    TOTAL KNEE ARTHROPLASTY Right 02/12/2014   Procedure: TOTAL KNEE ARTHROPLASTY;  Surgeon: Dannielle Huh, MD;  Location: [facility] OR;  Service: Orthopedics;  Laterality: Right;    Current Facility-Administered Medications  Medication Dose Route Frequency Provider Last Rate Last Admin   [START ON 12/18/2020] ceFAZolin (ANCEF) IVPB 2g/100 mL premix  2 g Intravenous On Call to OR Kirtland Bouchard, PA-C       lactated ringers infusion   Intravenous Continuous Gaynelle Adu, MD 10 mL/hr at 12/17/20 1226 New Bag at 12/17/20 1226   tranexamic acid (CYKLOKAPRON) IVPB 1,000 mg  1,000 mg Intravenous To OR Kirtland Bouchard, PA-C       Allergies  Allergen Reactions   Oxycodone Nausea And Vomiting    Pt stated, "It makes me not want to eat"   Hydrocodone Itching    Social History   Tobacco Use   Smoking status: Every Day    Packs/day: 0.50    Years: 35.00    Pack years: 17.50  Types: Cigarettes   Smokeless tobacco: Never  Substance Use Topics   Alcohol use: No    Family History  Problem Relation Age of Onset   Diabetes Mother    Stroke Mother    Alcohol abuse Father    Stroke Father    Colon cancer Neg Hx      Review of Systems  Musculoskeletal:  Positive for gait problem.  All other systems reviewed and are negative.  Objective:  Physical Exam Vitals reviewed.  Constitutional:      Appearance: Normal appearance.  HENT:     Head: Normocephalic and atraumatic.  Eyes:     Extraocular  Movements: Extraocular movements intact.     Pupils: Pupils are equal, round, and reactive to light.  Cardiovascular:     Rate and Rhythm: Normal rate and regular rhythm.  Pulmonary:     Effort: Pulmonary effort is normal.     Breath sounds: Normal breath sounds.  Abdominal:     Palpations: Abdomen is soft.  Musculoskeletal:     Cervical back: Normal range of motion and neck supple.     Right hip: Tenderness and bony tenderness present. Decreased range of motion. Decreased strength.  Neurological:     Mental Status: She is alert and oriented to person, place, and time.  Psychiatric:        Behavior: Behavior normal.    Vital signs in last 24 hours: Temp:  [97.8 F (36.6 C)] 97.8 F (36.6 C) (08/30 1109) Pulse Rate:  [73] 73 (08/30 1109) Resp:  [18] 18 (08/30 1109) BP: (142)/(69) 142/69 (08/30 1109) SpO2:  [94 %] 94 % (08/30 1109) Weight:  [99.8 kg] 99.8 kg (08/30 1109)  Labs:   Estimated body mass index is 36.61 kg/m as calculated from the following:   Height as of this encounter: 5\' 5"  (1.651 m).   Weight as of this encounter: 99.8 kg.   Imaging Review Plain radiographs demonstrate severe degenerative joint disease of the right hip(s). The bone quality appears to be good for age and reported activity level.      Assessment/Plan:  End stage arthritis, right hip(s)  The patient history, physical examination, clinical judgement of the provider and imaging studies are consistent with end stage degenerative joint disease of the right hip(s) and total hip arthroplasty is deemed medically necessary. The treatment options including medical management, injection therapy, arthroscopy and arthroplasty were discussed at length. The risks and benefits of total hip arthroplasty were presented and reviewed. The risks due to aseptic loosening, infection, stiffness, dislocation/subluxation,  thromboembolic complications and other imponderables were discussed.  The patient acknowledged  the explanation, agreed to proceed with the plan and consent was signed. Patient is being admitted for inpatient treatment for surgery, pain control, PT, OT, prophylactic antibiotics, VTE prophylaxis, progressive ambulation and ADL's and discharge planning.The patient is planning to be discharged home with home health services

## 2020-12-17 NOTE — Anesthesia Postprocedure Evaluation (Signed)
Anesthesia Post Note  Patient: Jennifer [name]  Procedure(s) Performed: RIGHT TOTAL HIP ARTHROPLASTY ANTERIOR APPROACH (Right: Hip)     Patient location during evaluation: PACU Anesthesia Type: Spinal Level of consciousness: awake and alert Pain management: pain level controlled Vital Signs Assessment: post-procedure vital signs reviewed and stable Respiratory status: spontaneous breathing and respiratory function stable Cardiovascular status: blood pressure returned to baseline and stable Postop Assessment: spinal receding and no apparent nausea or vomiting Anesthetic complications: no   No notable events documented.  Last Vitals:  Vitals:   12/17/20 1748 12/17/20 1755  BP:  107/65  Pulse: (!) 56 (!) 55  Resp: 14 18  Temp:    SpO2: 100% 93%                  Beryle Lathe

## 2020-12-17 NOTE — Anesthesia Procedure Notes (Addendum)
Procedure Name: MAC Date/Time: 12/17/2020 2:07 PM Performed by: Michele Rockers, CRNA Pre-anesthesia Checklist: Patient identified, Emergency Drugs available, Suction available, Timeout performed and Patient being monitored Patient Re-evaluated:Patient Re-evaluated prior to induction Oxygen Delivery Method: Simple face mask

## 2020-12-18 DIAGNOSIS — E119 Type 2 diabetes mellitus without complications: Secondary | ICD-10-CM | POA: Diagnosis not present

## 2020-12-18 DIAGNOSIS — F17210 Nicotine dependence, cigarettes, uncomplicated: Secondary | ICD-10-CM | POA: Diagnosis not present

## 2020-12-18 DIAGNOSIS — I10 Essential (primary) hypertension: Secondary | ICD-10-CM | POA: Diagnosis not present

## 2020-12-18 DIAGNOSIS — M1611 Unilateral primary osteoarthritis, right hip: Secondary | ICD-10-CM | POA: Diagnosis not present

## 2020-12-18 DIAGNOSIS — Z20822 Contact with and (suspected) exposure to covid-19: Secondary | ICD-10-CM | POA: Diagnosis not present

## 2020-12-18 DIAGNOSIS — Z96651 Presence of right artificial knee joint: Secondary | ICD-10-CM | POA: Diagnosis not present

## 2020-12-18 LAB — CBC
HCT: 36.3 % (ref 36.0–46.0)
Hemoglobin: 11.9 g/dL — ABNORMAL LOW (ref 12.0–15.0)
MCH: 29.2 pg (ref 26.0–34.0)
MCHC: 32.8 g/dL (ref 30.0–36.0)
MCV: 89.2 fL (ref 80.0–100.0)
Platelets: 246 10*3/uL (ref 150–400)
RBC: 4.07 MIL/uL (ref 3.87–5.11)
RDW: 13.6 % (ref 11.5–15.5)
WBC: 11 10*3/uL — ABNORMAL HIGH (ref 4.0–10.5)
nRBC: 0 % (ref 0.0–0.2)

## 2020-12-18 LAB — BASIC METABOLIC PANEL
Anion gap: 8 (ref 5–15)
BUN: 13 mg/dL (ref 8–23)
CO2: 25 mmol/L (ref 22–32)
Calcium: 9.1 mg/dL (ref 8.9–10.3)
Chloride: 102 mmol/L (ref 98–111)
Creatinine, Ser: 0.64 mg/dL (ref 0.44–1.00)
GFR, Estimated: >60 mL/min (ref >60)
Glucose, Bld: 145 mg/dL — ABNORMAL HIGH (ref 70–99)
Potassium: 3.5 mmol/L (ref 3.5–5.1)
Sodium: 135 mmol/L (ref 135–145)

## 2020-12-18 MED ORDER — OXYCODONE HCL 5 MG PO TABS: 5.0000 mg | ORAL_TABLET | Freq: Four times a day (QID) | ORAL | 0 refills | Status: DC | PRN

## 2020-12-18 MED ORDER — ONDANSETRON 4 MG PO TBDP: 4.0000 mg | ORAL_TABLET | Freq: Three times a day (TID) | ORAL | 0 refills | Status: DC | PRN

## 2020-12-18 MED ORDER — METHOCARBAMOL 500 MG PO TABS: 500.0000 mg | ORAL_TABLET | Freq: Four times a day (QID) | ORAL | 1 refills | Status: DC | PRN

## 2020-12-18 MED ORDER — ASPIRIN 81 MG PO CHEW: 81.0000 mg | CHEWABLE_TABLET | Freq: Two times a day (BID) | ORAL | 0 refills | Status: DC

## 2020-12-18 NOTE — Progress Notes (Signed)
Subjective: 1 Day Post-Op Procedure(s) (LRB): RIGHT TOTAL HIP ARTHROPLASTY ANTERIOR APPROACH (Right) Patient reports pain as moderate.    Objective: Vital signs in last 24 hours: Temp:  [97.5 F (36.4 C)-99.1 F (37.3 C)] 99.1 F (37.3 C) (08/31 0556) Pulse Rate:  [55-79] 71 (08/31 0556) Resp:  [11-19] 18 (08/31 0556) BP: (99-142)/(56-97) 132/76 (08/31 0556) SpO2:  [93 %-100 %] 93 % (08/31 0556) Weight:  [99.8 kg] 99.8 kg (08/30 1109)  Intake/Output from previous day: 08/30 0701 - 08/31 0700 In: 1690 [P.O.:340; I.V.:1150; IV Piggyback:200] Out: 1150 [Urine:900; Blood:250] Intake/Output this shift: No intake/output data recorded.  Recent Labs    12/18/20 0112  HGB 11.9*   Recent Labs    12/18/20 0112  WBC 11.0*  RBC 4.07  HCT 36.3  PLT 246   Recent Labs    12/18/20 0112  NA 135  K 3.5  CL 102  CO2 25  BUN 13  CREATININE 0.64  GLUCOSE 145*  CALCIUM 9.1   No results for input(s): LABPT, INR in the last 72 hours.  Sensation intact distally Intact pulses distally Dorsiflexion/Plantar flexion intact Incision: scant drainage   Assessment/Plan: 1 Day Post-Op Procedure(s) (LRB): RIGHT TOTAL HIP ARTHROPLASTY ANTERIOR APPROACH (Right) Up with therapy Discharge home with home health this afternoon.      Kathryne Hitch 12/18/2020, 7:53 AM

## 2020-12-18 NOTE — Progress Notes (Signed)
Physical Therapy Treatment Patient Details Name: Jennifer [name] MRN: [id number] DOB: 10/29/1950 Today's Date: 12/18/2020    History of Present Illness 70yo female who received R direct anterior THR on 12/17/20. PMH PAD, DM, L ankle pain, HTN, obesity, HLD, tobacco abuse, R TKR, L foot tendon surgery    PT Comments    Patient received sitting at EOB, pleasant and cooperative this afternoon. She was a bit more impulsive- question if this was from medications or just overall fatigue and needed cues to wait for PT/environment to be ready before trying to stand. Tolerated modest progression of gait distance but needed one sitting rest break and still very much limited by pain. Left up in recliner with all needs met this afternoon. Will continue efforts. Looks like she will be staying overnight per Dr. Trevor Mace midday note- will plan to practice single step and HEP tomorrow.     Follow Up Recommendations  Follow surgeon's recommendation for DC plan and follow-up therapies     Equipment Recommendations  Rolling walker with 5" wheels;3in1 (PT);Wheelchair (measurements PT);Wheelchair cushion (measurements PT)    Recommendations for Other Services       Precautions / Restrictions Precautions Precautions: Fall Restrictions Weight Bearing Restrictions: No RLE Weight Bearing: Weight bearing as tolerated Other Position/Activity Restrictions: R direct anterior THA- no hip precautions    Mobility  Bed Mobility Overal bed mobility: Needs Assistance             General bed mobility comments: sitting up at EOB upon entry    Transfers Overall transfer level: Needs assistance Equipment used: Rolling walker (2 wheeled) Transfers: Sit to/from Stand Sit to Stand: Min guard         General transfer comment: min guard to boost all the way to standing, cues for hand placement and sequencing  Ambulation/Gait Ambulation/Gait assistance: Min guard Gait Distance (Feet): 24 Feet (24f,  175f Assistive device: Rolling walker (2 wheeled) Gait Pattern/deviations: Step-to pattern;Decreased step length - right;Decreased step length - left;Decreased stance time - right;Decreased weight shift to right;Antalgic Gait velocity: decreased   General Gait Details: tolerated progression of gait distance but needed one sitting rest break; Mod cues for sequencing/safety with RW. Still very much limited by pain   Stairs             Wheelchair Mobility    Modified Rankin (Stroke Patients Only)       Balance Overall balance assessment: Needs assistance Sitting-balance support: Bilateral upper extremity supported;Feet supported Sitting balance-Leahy Scale: Good     Standing balance support: Bilateral upper extremity supported;During functional activity Standing balance-Leahy Scale: Fair Standing balance comment: reliant on BUE support                            Cognition Arousal/Alertness: Awake/alert Behavior During Therapy: WFL for tasks assessed/performed Overall Cognitive Status: Within Functional Limits for tasks assessed                                 General Comments: a bit more impulsive this afternoon- unclear if this was from medications or just general fatigue      Exercises      General Comments        Pertinent Vitals/Pain Pain Assessment: 0-10 Pain Score: 6  Pain Location: right hip Pain Descriptors / Indicators: Discomfort;Operative site guarding Pain Intervention(s): Limited activity within patient's tolerance;Monitored during session;Premedicated before  session    Home Living                      Prior Function            PT Goals (current goals can now be found in the care plan section) Acute Rehab PT Goals Patient Stated Goal: less pain PT Goal Formulation: With patient Time For Goal Achievement: 01/01/21 Potential to Achieve Goals: Good Progress towards PT goals: Progressing toward goals     Frequency    7X/week      PT Plan Current plan remains appropriate    Co-evaluation              AM-PAC PT "6 Clicks" Mobility   Outcome Measure  Help needed turning from your back to your side while in a flat bed without using bedrails?: A Little Help needed moving from lying on your back to sitting on the side of a flat bed without using bedrails?: A Little Help needed moving to and from a bed to a chair (including a wheelchair)?: A Little Help needed standing up from a chair using your arms (e.g., wheelchair or bedside chair)?: A Little Help needed to walk in hospital room?: A Little Help needed climbing 3-5 steps with a railing? : A Little 6 Click Score: 18    End of Session Equipment Utilized During Treatment: Gait belt Activity Tolerance: Patient tolerated treatment well Patient left: in chair;with call bell/phone within reach Nurse Communication: Mobility status PT Visit Diagnosis: Difficulty in walking, not elsewhere classified (R26.2);Pain;Muscle weakness (generalized) (M62.81) Pain - Right/Left: Right Pain - part of body: Hip     Time: 0148-4039 PT Time Calculation (min) (ACUTE ONLY): 20 min  Charges:  $Gait Training: 8-22 mins                    Windell Norfolk, DPT, PN2   Supplemental Physical Therapist [place]    Pager [phone] Acute Rehab Office [phone]

## 2020-12-18 NOTE — Evaluation (Signed)
Physical Therapy Evaluation Patient Details Name: Jennifer [name] MRN: [id number] DOB: June 15, 1950 Today's Date: 12/18/2020   History of Present Illness  70yo female who received R direct anterior THR on 12/17/20. PMH PAD, DM, L ankle pain, HTN, obesity, HLD, tobacco abuse, R TKR, L foot tendon surgery  Clinical Impression   Patient received in bed, very motivated for OOB activities and pre-medicated prior to session- however still limited by pain. Able to get to EOB with Min-ModA, then tolerated short distance gait training in room but unable to progress to hallway ambulation this morning. VSS on RA. Left up in recliner with all needs met, chair alarm active. Plan to return this afternoon for ongoing gait training/step practice and HEP.     Follow Up Recommendations Follow surgeon's recommendation for DC plan and follow-up therapies    Equipment Recommendations  Rolling walker with 5" wheels;3in1 (PT);Wheelchair (measurements PT);Wheelchair cushion (measurements PT)    Recommendations for Other Services       Precautions / Restrictions Precautions Precautions: Fall Restrictions Weight Bearing Restrictions: No RLE Weight Bearing: Weight bearing as tolerated Other Position/Activity Restrictions: R direct anterior THA- no hip precautions      Mobility  Bed Mobility Overal bed mobility: Needs Assistance Bed Mobility: Supine to Sit     Supine to sit: HOB elevated;Mod assist     General bed mobility comments: ModA/HOB elevated and increased time and effort to get to EOB/square hips up at side    Transfers Overall transfer level: Needs assistance Equipment used: Rolling walker (2 wheeled) Transfers: Sit to/from Stand Sit to Stand: Min assist;From elevated surface         General transfer comment: MinA to boost from slighlty elevated bed, cues for hand placement and sequencing  Ambulation/Gait Ambulation/Gait assistance: Min guard Gait Distance (Feet): 6 Feet Assistive  device: Rolling walker (2 wheeled) Gait Pattern/deviations: Step-to pattern;Antalgic;Trunk flexed;Wide base of support;Decreased weight shift to right Gait velocity: decreased   General Gait Details: gait distance limited by pain- able to get to recliner but unable to progress gait distance further this morning due to pain levels (already premedicated)  Stairs            Wheelchair Mobility    Modified Rankin (Stroke Patients Only)       Balance Overall balance assessment: Needs assistance Sitting-balance support: Bilateral upper extremity supported;Feet supported Sitting balance-Leahy Scale: Good     Standing balance support: Bilateral upper extremity supported;During functional activity Standing balance-Leahy Scale: Fair Standing balance comment: reliant on BUE support                             Pertinent Vitals/Pain Pain Assessment: 0-10 Pain Score: 8  Pain Location: right hip Pain Descriptors / Indicators: Discomfort;Operative site guarding Pain Intervention(s): Premedicated before session;Monitored during session;Limited activity within patient's tolerance;Repositioned    Home Living Family/patient expects to be discharged to:: Private residence Living Arrangements: Spouse/significant other;Children;Other (Comment) (husband, son, daughter) Available Help at Discharge: Family;Available 24 hours/day Type of Home: House Home Access: Stairs to enter   [employer] of Steps: 1 STE Home Layout: One level Home Equipment: Walker - 2 wheels;Bedside commode;Cane - single point Additional Comments: usually uses RW to get around; no falls or close calls; drives, managing bills and meds    Prior Function Level of Independence: Independent with assistive device(s)               Hand Dominance  Extremity/Trunk Assessment   Upper Extremity Assessment Upper Extremity Assessment: Overall WFL for tasks assessed    Lower Extremity  Assessment Lower Extremity Assessment: Overall WFL for tasks assessed    Cervical / Trunk Assessment Cervical / Trunk Assessment: Normal  Communication   Communication: No difficulties  Cognition Arousal/Alertness: Awake/alert Behavior During Therapy: WFL for tasks assessed/performed Overall Cognitive Status: Within Functional Limits for tasks assessed                                        General Comments General comments (skin integrity, edema, etc.): VSS on RA    Exercises     Assessment/Plan    PT Assessment Patient needs continued PT services  PT Problem List Decreased strength;Obesity;Decreased knowledge of use of DME;Decreased activity tolerance;Decreased balance;Decreased knowledge of precautions;Pain;Decreased mobility       PT Treatment Interventions DME instruction;Balance training;Gait training;Stair training;Functional mobility training;Patient/family education;Therapeutic activities;Therapeutic exercise;Wheelchair mobility training    PT Goals (Current goals can be found in the Care Plan section)  Acute Rehab PT Goals Patient Stated Goal: less pain PT Goal Formulation: With patient Time For Goal Achievement: 01/01/21 Potential to Achieve Goals: Good    Frequency 7X/week   Barriers to discharge        Co-evaluation               AM-PAC PT "6 Clicks" Mobility  Outcome Measure Help needed turning from your back to your side while in a flat bed without using bedrails?: A Little Help needed moving from lying on your back to sitting on the side of a flat bed without using bedrails?: A Lot Help needed moving to and from a bed to a chair (including a wheelchair)?: A Little Help needed standing up from a chair using your arms (e.g., wheelchair or bedside chair)?: A Little Help needed to walk in hospital room?: A Little Help needed climbing 3-5 steps with a railing? : A Little 6 Click Score: 17    End of Session Equipment Utilized  During Treatment: Gait belt Activity Tolerance: Patient limited by pain Patient left: in chair;with call bell/phone within reach;with chair alarm set Nurse Communication: Mobility status PT Visit Diagnosis: Difficulty in walking, not elsewhere classified (R26.2);Pain;Muscle weakness (generalized) (M62.81) Pain - Right/Left: Right Pain - part of body: Hip    Time: 3662-9476 PT Time Calculation (min) (ACUTE ONLY): 30 min   Charges:   PT Evaluation $PT Eval Low Complexity: 1 Low PT Treatments $Gait Training: 8-22 mins       Windell Norfolk, DPT, PN2   Supplemental Physical Therapist [place]    Pager [phone] Acute Rehab Office [phone]

## 2020-12-18 NOTE — Discharge Instructions (Signed)

## 2020-12-18 NOTE — Progress Notes (Signed)
Patient ID: Jennifer [name], female   DOB: 1950/11/03, 70 y.o.   MRN: [id number] Given her slow mobility, she is not cleared from a therapy standpoint to be safely discharged to home today.  We will keep her today and hopefully discharge her tomorrow.

## 2020-12-18 NOTE — Care Plan (Signed)
Ortho Bundle Case Management Note  Patient Details  Name: Jennifer [name] MRN: [id number] Date of Birth: 11-07-1950  [facility] call to patient to discuss her upcoming Right total hip arthroplasty with Dr. Magnus Ivan. She is an Ortho bundle patient through [facility] and is agreeable to case management. She lives with her husband and will have help from him and her daughter after discharge home. She has a FWW and 3in1/BSC. No DME will be needed. Anticipate HHPT will be needed after a short hospital stay. Choice provided and referral to [facility]. Reviewed all post op care instructions. Will follow for needs                 DME Arranged:   (No DME needed at this time. Patient has a FWW and BSC) DME Agency:     HH Arranged:  PT HH Agency:  [facility]  Additional Comments: Please contact me with any questions of if this plan should need to change.  Ralph Dowdy, RN, BSN, [employer]  [phone] 12/18/2020, 9:21 AM

## 2020-12-19 ENCOUNTER — Encounter (HOSPITAL_COMMUNITY): Payer: Self-pay | Admitting: Orthopaedic Surgery

## 2020-12-19 DIAGNOSIS — M1611 Unilateral primary osteoarthritis, right hip: Secondary | ICD-10-CM | POA: Diagnosis not present

## 2020-12-19 DIAGNOSIS — E119 Type 2 diabetes mellitus without complications: Secondary | ICD-10-CM | POA: Diagnosis not present

## 2020-12-19 DIAGNOSIS — Z96651 Presence of right artificial knee joint: Secondary | ICD-10-CM | POA: Diagnosis not present

## 2020-12-19 DIAGNOSIS — Z20822 Contact with and (suspected) exposure to covid-19: Secondary | ICD-10-CM | POA: Diagnosis not present

## 2020-12-19 DIAGNOSIS — I10 Essential (primary) hypertension: Secondary | ICD-10-CM | POA: Diagnosis not present

## 2020-12-19 DIAGNOSIS — F17210 Nicotine dependence, cigarettes, uncomplicated: Secondary | ICD-10-CM | POA: Diagnosis not present

## 2020-12-19 NOTE — Discharge Summary (Signed)
Patient ID: Jennifer [name] MRN: [id number] DOB/AGE: 68/02/1951 68 y.o.  Admit date: 12/17/2020 Discharge date: 12/19/2020  Admission Diagnoses:  Principal Problem:   Unilateral primary osteoarthritis, right hip Active Problems:   Status post total replacement of right hip   Discharge Diagnoses:  Same  Past Medical History:  Diagnosis Date   Arthritis    Dyspnea    Hyperlipidemia    Hypertension    Pre-diabetes    Sleep apnea     Surgeries: Procedure(s): RIGHT TOTAL HIP ARTHROPLASTY ANTERIOR APPROACH on 12/17/2020   Consultants:   Discharged Condition: Improved  Hospital Course: AZARAH DACY is an 70 y.o. female who was admitted 12/17/2020 for operative treatment ofUnilateral primary osteoarthritis, right hip. Patient has severe unremitting pain that affects sleep, daily activities, and work/hobbies. After pre-op clearance the patient was taken to the operating room on 12/17/2020 and underwent  Procedure(s): RIGHT TOTAL HIP ARTHROPLASTY ANTERIOR APPROACH.    Patient was given perioperative antibiotics:  Anti-infectives (From admission, onward)    Start     Dose/Rate Route Frequency Ordered Stop   12/18/20 0600  ceFAZolin (ANCEF) IVPB 2g/100 mL premix        2 g 200 mL/hr over 30 Minutes Intravenous On call to O.R. 12/17/20 1205 12/17/20 1430   12/17/20 2015  ceFAZolin (ANCEF) IVPB 1 g/50 mL premix        1 g 100 mL/hr over 30 Minutes Intravenous Every 6 hours 12/17/20 1904 12/18/20 0255        Patient was given sequential compression devices, early ambulation, and chemoprophylaxis to prevent DVT.  Patient benefited maximally from hospital stay and there were no complications.    Recent vital signs: Patient Vitals for the past 24 hrs:  BP Temp Temp src Pulse Resp SpO2  12/19/20 0734 (!) 158/87 (!) 100.7 F (38.2 C) Oral 100 17 97 %  12/18/20 2102 135/74 98.8 F (37.1 C) Oral 91 20 97 %  12/18/20 1706 (!) 152/82 (!) 100.4 F (38 C) Oral 98 16 96 %      Recent laboratory studies:  Recent Labs    12/18/20 0112  WBC 11.0*  HGB 11.9*  HCT 36.3  PLT 246  NA 135  K 3.5  CL 102  CO2 25  BUN 13  CREATININE 0.64  GLUCOSE 145*  CALCIUM 9.1     Discharge Medications:   Allergies as of 12/19/2020       Reactions   Oxycodone Nausea And Vomiting   Pt stated, "It makes me not want to eat"   Hydrocodone Itching        Medication List     TAKE these medications    acetaminophen 500 MG tablet Commonly known as: TYLENOL Take 1,000 mg by mouth 2 (two) times daily as needed for mild pain or moderate pain.   ADULT GUMMY PO Take 1 capsule by mouth daily.   aspirin 81 MG chewable tablet Chew 1 tablet (81 mg total) by mouth 2 (two) times daily.   atenolol 25 MG tablet Commonly known as: TENORMIN Take 1 tablet (25 mg total) by mouth daily.   atorvastatin 40 MG tablet Commonly known as: LIPITOR Take 1 tablet (40 mg total) by mouth at bedtime.   calcium carbonate 500 MG chewable tablet Commonly known as: TUMS - dosed in mg elemental calcium Chew 1,000 mg by mouth daily as needed for indigestion or heartburn.   Coenzyme Q-10 100 MG capsule Take 2 capsules (200 mg total) by mouth daily.  What changed: how much to take   magnesium hydroxide 400 MG/5ML suspension Commonly known as: MILK OF MAGNESIA Take 15 mLs by mouth daily as needed for mild constipation.   metFORMIN 500 MG tablet Commonly known as: GLUCOPHAGE Take 1 tablet (500 mg total) by mouth daily with breakfast.   methocarbamol 500 MG tablet Commonly known as: ROBAXIN Take 1 tablet (500 mg total) by mouth every 6 (six) hours as needed for muscle spasms.   ondansetron 4 MG disintegrating tablet Commonly known as: Zofran ODT Take 1 tablet (4 mg total) by mouth every 8 (eight) hours as needed for nausea or vomiting.   oxyCODONE 5 MG immediate release tablet Commonly known as: Oxy IR/ROXICODONE Take 1 tablet (5 mg total) by mouth every 6 (six) hours as needed  for moderate pain (pain score 4-6).   triamterene-hydrochlorothiazide 37.5-25 MG tablet Commonly known as: MAXZIDE-25 Take 1 tablet by mouth daily.               Durable Medical Equipment  (From admission, onward)           Start     Ordered   12/17/20 1951  DME 3 n 1  Once        12/17/20 1950   12/17/20 1951  DME Walker rolling  Once       Question Answer Comment  Walker: With 5 Inch Wheels   Patient needs a walker to treat with the following condition Status post total replacement of right hip      12/17/20 1950            Diagnostic Studies: DG Pelvis Portable  Result Date: 12/17/2020 CLINICAL DATA:  Status post right hip replacement. EXAM: PORTABLE PELVIS 1-2 VIEWS COMPARISON:  Fluoroscopic images of same day. FINDINGS: Right acetabular and femoral components are well situated. Expected postoperative changes are seen in the surrounding soft tissues. IMPRESSION: Status post right total hip arthroplasty. Electronically Signed   By: Lupita Raider M.D.   On: 12/17/2020 16:43   DG C-Arm 1-60 Min-No Report  Result Date: 12/17/2020 Fluoroscopy was utilized by the requesting physician.  No radiographic interpretation.   DG HIP OPERATIVE UNILAT WITH PELVIS RIGHT  Result Date: 12/17/2020 CLINICAL DATA:  Anterior hip replacement surgery. EXAM: OPERATIVE right HIP (WITH PELVIS IF PERFORMED) 3 VIEWS TECHNIQUE: Fluoroscopic spot image(s) were submitted for interpretation post-operatively. COMPARISON:  None. FINDINGS: Right total hip arthroplasty is present in anatomic alignment. No fractures are seen. FLUOROSCOPY TIME:  25 seconds. IMPRESSION: Right hip total arthroplasty in anatomic alignment. Electronically Signed   By: Darliss Cheney M.D.   On: 12/17/2020 16:33    Disposition: Discharge disposition: 06-Home-Health Care Svc          Follow-up Information     Kathryne Hitch, MD. Go on 12/30/2020.   Specialty: Orthopedic Surgery Why: at 1:00 pm for your 2  week post op appointment in our office. Contact information: [address] [place] [address] [phone]         [facility] Follow up.   Specialty: Home Health Services Why: Home health PT services will be provided by [facility]. Start of care within 48 hours post hospital discharge Contact information: [address] [place] [address] [place] [address] [phone]                  Signed: Richardean Canal 12/19/2020, 1:28 PM

## 2020-12-19 NOTE — Progress Notes (Signed)
Subjective: 2 Days Post-Op Procedure(s) (LRB): RIGHT TOTAL HIP ARTHROPLASTY ANTERIOR APPROACH (Right) Patient reports pain as mild.  No complaints . Progressing well with PT.   Objective: Vital signs in last 24 hours: Temp:  [98.8 F (37.1 C)-100.7 F (38.2 C)] 100.7 F (38.2 C) (09/01 0734) Pulse Rate:  [91-100] 100 (09/01 0734) Resp:  [16-20] 17 (09/01 0734) BP: (135-158)/(74-87) 158/87 (09/01 0734) SpO2:  [96 %-97 %] 97 % (09/01 0734)  Intake/Output from previous day: 08/31 0701 - 09/01 0700 In: 480 [P.O.:480] Out: 550 [Urine:550] Intake/Output this shift: Total I/O In: 120 [P.O.:120] Out: -   Recent Labs    12/18/20 0112  HGB 11.9*   Recent Labs    12/18/20 0112  WBC 11.0*  RBC 4.07  HCT 36.3  PLT 246   Recent Labs    12/18/20 0112  NA 135  K 3.5  CL 102  CO2 25  BUN 13  CREATININE 0.64  GLUCOSE 145*  CALCIUM 9.1   No results for input(s): LABPT, INR in the last 72 hours.  Sensation intact distally Intact pulses distally Dorsiflexion/Plantar flexion intact Incision: dressing C/D/I Compartment soft   Assessment/Plan: 2 Days Post-Op Procedure(s) (LRB): RIGHT TOTAL HIP ARTHROPLASTY ANTERIOR APPROACH (Right) Discharge home with home health if patient remains stable and does well with PT this afternoon.        Jennifer [name] 12/19/2020, 1:25 PM

## 2020-12-19 NOTE — Progress Notes (Signed)
Physical Therapy Treatment Patient Details Name: Jennifer [name] MRN: [id number] DOB: Apr 01, 1951 Today's Date: 12/19/2020    History of Present Illness 70yo female who received R direct anterior THR on 12/17/20. PMH PAD, DM, L ankle pain, HTN, obesity, HLD, tobacco abuse, R TKR, L foot tendon surgery    PT Comments    Patient received sitting at EOB requesting to use bathroom. Still a bit discombobulated and needed cues for safety/to wait for PT and environment to be ready for mobility. Able to mobilize much better today overall- had good carryover of hand placement/sequencing for transfers and gait with RW. Tolerated progression of gait distance well but still ultimately limited by pain in surgical hip. Left up in recliner with all needs met, chair alarm active. Plan to return this afternoon for stair training and HEP prior to DC.     Follow Up Recommendations  Follow surgeon's recommendation for DC plan and follow-up therapies     Equipment Recommendations  Rolling walker with 5" wheels;3in1 (PT);Wheelchair (measurements PT);Wheelchair cushion (measurements PT)    Recommendations for Other Services       Precautions / Restrictions Precautions Precautions: Fall Restrictions Weight Bearing Restrictions: No RLE Weight Bearing: Weight bearing as tolerated Other Position/Activity Restrictions: R direct anterior THA- no hip precautions    Mobility  Bed Mobility               General bed mobility comments: sitting up at EOB upon entry    Transfers Overall transfer level: Needs assistance Equipment used: Rolling walker (2 wheeled) Transfers: Sit to/from Stand Sit to Stand: Supervision         General transfer comment: S for safety, occasional cues for hand placement but good carryover of technique from yesterday  Ambulation/Gait Ambulation/Gait assistance: Supervision Gait Distance (Feet): 60 Feet Assistive device: Rolling walker (2 wheeled) Gait Pattern/deviations:  Step-to pattern;Decreased step length - right;Decreased step length - left;Decreased stance time - right;Decreased weight shift to right;Antalgic Gait velocity: decreased   General Gait Details: still with antalgic gait pattern but able to progress distance well today, ultimately still limited by pain. Cues for upright posture as well as heel toe pattern today.   Stairs             Wheelchair Mobility    Modified Rankin (Stroke Patients Only)       Balance Overall balance assessment: Needs assistance Sitting-balance support: Bilateral upper extremity supported;Feet supported Sitting balance-Leahy Scale: Good     Standing balance support: Bilateral upper extremity supported;During functional activity Standing balance-Leahy Scale: Fair Standing balance comment: reliant on BUE support                            Cognition Arousal/Alertness: Awake/alert Behavior During Therapy: WFL for tasks assessed/performed Overall Cognitive Status: Within Functional Limits for tasks assessed                                 General Comments: still a bit impulsive and with reduced safety awareness- unclear if this is from pain medications but able to follow cues for safety well      Exercises      General Comments        Pertinent Vitals/Pain Pain Assessment: Faces Faces Pain Scale: Hurts little more Pain Location: right hip Pain Descriptors / Indicators: Discomfort;Operative site guarding Pain Intervention(s): Limited activity within patient's tolerance;Monitored during session;Repositioned  Home Living                      Prior Function            PT Goals (current goals can now be found in the care plan section) Acute Rehab PT Goals Patient Stated Goal: less pain PT Goal Formulation: With patient Time For Goal Achievement: 01/01/21 Potential to Achieve Goals: Good Progress towards PT goals: Progressing toward goals     Frequency    7X/week      PT Plan Current plan remains appropriate    Co-evaluation              AM-PAC PT "6 Clicks" Mobility   Outcome Measure  Help needed turning from your back to your side while in a flat bed without using bedrails?: A Little Help needed moving from lying on your back to sitting on the side of a flat bed without using bedrails?: A Little Help needed moving to and from a bed to a chair (including a wheelchair)?: A Little Help needed standing up from a chair using your arms (e.g., wheelchair or bedside chair)?: A Little Help needed to walk in hospital room?: A Little Help needed climbing 3-5 steps with a railing? : A Little 6 Click Score: 18    End of Session   Activity Tolerance: Patient tolerated treatment well Patient left: in chair;with call bell/phone within reach;with chair alarm set Nurse Communication: Mobility status PT Visit Diagnosis: Difficulty in walking, not elsewhere classified (R26.2);Pain;Muscle weakness (generalized) (M62.81) Pain - Right/Left: Right Pain - part of body: Hip     Time: 7510-2585 PT Time Calculation (min) (ACUTE ONLY): 24 min  Charges:  $Gait Training: 8-22 mins $Therapeutic Activity: 8-22 mins                    Windell Norfolk, DPT, PN2   Supplemental Physical Therapist [place]    Pager [phone] Acute Rehab Office [phone]

## 2020-12-19 NOTE — Progress Notes (Signed)
Physical Therapy Treatment Patient Details Name: Jennifer [name] MRN: [id number] DOB: 03/22/1951 Today's Date: 12/19/2020    History of Present Illness 69yo female who received R direct anterior THR on 12/17/20. PMH PAD, DM, L ankle pain, HTN, obesity, HLD, tobacco abuse, R TKR, L foot tendon surgery    PT Comments    Patient received in chair, pleasant and cooperative with therapy. Needed cues for hand and foot placement, however able to perform all transfers with S today.  Session focus on stair training (able to perform with min guard/RW) and HEP practice. Had quite a bit of pain with standing HEP so focused on sitting/supine exercises for now. Left in bed with all needs met this afternoon, bed alarm active. Progressing well overall.    Follow Up Recommendations  Follow surgeon's recommendation for DC plan and follow-up therapies     Equipment Recommendations  Rolling walker with 5" wheels;3in1 (PT);Wheelchair (measurements PT);Wheelchair cushion (measurements PT)    Recommendations for Other Services       Precautions / Restrictions Precautions Precautions: Fall Restrictions Weight Bearing Restrictions: No RLE Weight Bearing: Weight bearing as tolerated Other Position/Activity Restrictions: R direct anterior THA- no hip precautions    Mobility  Bed Mobility Overal bed mobility: Needs Assistance Bed Mobility: Sit to Supine       Sit to supine: Min assist   General bed mobility comments: MinA for BLE management for return to flat bed    Transfers Overall transfer level: Needs assistance Equipment used: Rolling walker (2 wheeled) Transfers: Sit to/from Stand Sit to Stand: Supervision         General transfer comment: S for safety, occasional cues for hand placement but good carryover of technique; does need occasional cues for getting R foot under her BOS  Ambulation/Gait Ambulation/Gait assistance: Supervision Gait Distance (Feet): 5 Feet Assistive device:  Rolling walker (2 wheeled) Gait Pattern/deviations: Step-to pattern;Decreased step length - right;Decreased step length - left;Decreased stance time - right;Decreased weight shift to right;Antalgic Gait velocity: decreased   General Gait Details: session focus on stair training and HEP   Stairs Stairs: Yes Stairs assistance: Min guard Stair Management: Backwards;With walker;No rails Number of Stairs: 1 General stair comments: min guard for safety, min VC for technique   Wheelchair Mobility    Modified Rankin (Stroke Patients Only)       Balance Overall balance assessment: Needs assistance Sitting-balance support: Bilateral upper extremity supported;Feet supported Sitting balance-Leahy Scale: Good     Standing balance support: Bilateral upper extremity supported;During functional activity Standing balance-Leahy Scale: Fair Standing balance comment: reliant on BUE support                            Cognition Arousal/Alertness: Awake/alert Behavior During Therapy: WFL for tasks assessed/performed Overall Cognitive Status: Within Functional Limits for tasks assessed                                 General Comments: still a bit impulsive and with reduced safety awareness- unclear if this is from pain medications but able to follow cues for safety well      Exercises      General Comments        Pertinent Vitals/Pain Pain Assessment: Faces Faces Pain Scale: Hurts little more Pain Location: right hip Pain Descriptors / Indicators: Discomfort;Operative site guarding Pain Intervention(s): Limited activity within patient's tolerance;Monitored during session      Physical Therapy Treatment Patient Details Name: Jennifer [name] MRN: [id number] DOB: [birth year] Today's Date: 12/19/2020    History of Present Illness 70yo female who received R direct anterior THR on 12/17/20. PMH PAD, DM, L ankle pain, HTN, obesity, HLD, tobacco abuse, R TKR, L foot tendon surgery    PT Comments    Patient received in chair, pleasant and cooperative with therapy. Needed cues for hand and foot placement, however able to perform all transfers with S today.  Session focus on stair training (able to perform with min guard/RW) and HEP practice. Had quite a bit of pain with standing HEP so focused on sitting/supine exercises for now. Left in bed with all needs met this afternoon, bed alarm active. Progressing well overall.    Follow Up Recommendations  Follow surgeon's recommendation for DC plan and follow-up therapies     Equipment Recommendations  Rolling walker with 5" wheels;3in1 (PT);Wheelchair (measurements PT);Wheelchair cushion (measurements PT)    Recommendations for Other Services       Precautions / Restrictions Precautions Precautions: Fall Restrictions Weight Bearing Restrictions: No RLE Weight Bearing: Weight bearing as tolerated Other Position/Activity Restrictions: R direct anterior THA- no hip precautions    Mobility  Bed Mobility Overal bed mobility: Needs Assistance Bed Mobility: Sit to Supine       Sit to supine: Min assist   General bed mobility comments: MinA for BLE management for return to flat bed    Transfers Overall transfer level: Needs assistance Equipment used: Rolling walker (2 wheeled) Transfers: Sit to/from Stand Sit to Stand: Supervision         General transfer comment: S for safety, occasional cues for hand placement but good carryover of technique; does need occasional cues for getting R foot under her BOS  Ambulation/Gait Ambulation/Gait assistance: Supervision Gait Distance (Feet): 5 Feet Assistive device:  Rolling walker (2 wheeled) Gait Pattern/deviations: Step-to pattern;Decreased step length - right;Decreased step length - left;Decreased stance time - right;Decreased weight shift to right;Antalgic Gait velocity: decreased   General Gait Details: session focus on stair training and HEP   Stairs Stairs: Yes Stairs assistance: Min guard Stair Management: Backwards;With walker;No rails Number of Stairs: 1 General stair comments: min guard for safety, min VC for technique   Wheelchair Mobility    Modified Rankin (Stroke Patients Only)       Balance Overall balance assessment: Needs assistance Sitting-balance support: Bilateral upper extremity supported;Feet supported Sitting balance-Leahy Scale: Good     Standing balance support: Bilateral upper extremity supported;During functional activity Standing balance-Leahy Scale: Fair Standing balance comment: reliant on BUE support                            Cognition Arousal/Alertness: Awake/alert Behavior During Therapy: WFL for tasks assessed/performed Overall Cognitive Status: Within Functional Limits for tasks assessed                                 General Comments: still a bit impulsive and with reduced safety awareness- unclear if this is from pain medications but able to follow cues for safety well      Exercises      General Comments        Pertinent Vitals/Pain Pain Assessment: Faces Faces Pain Scale: Hurts little more Pain Location: right hip Pain Descriptors / Indicators: Discomfort;Operative site guarding Pain Intervention(s): Limited activity within patient's tolerance;Monitored during session

## 2020-12-20 DIAGNOSIS — Z96641 Presence of right artificial hip joint: Secondary | ICD-10-CM | POA: Diagnosis not present

## 2020-12-20 DIAGNOSIS — Z96651 Presence of right artificial knee joint: Secondary | ICD-10-CM | POA: Diagnosis not present

## 2020-12-20 DIAGNOSIS — G4733 Obstructive sleep apnea (adult) (pediatric): Secondary | ICD-10-CM | POA: Diagnosis not present

## 2020-12-20 DIAGNOSIS — E785 Hyperlipidemia, unspecified: Secondary | ICD-10-CM | POA: Diagnosis not present

## 2020-12-20 DIAGNOSIS — F17210 Nicotine dependence, cigarettes, uncomplicated: Secondary | ICD-10-CM | POA: Diagnosis not present

## 2020-12-20 DIAGNOSIS — Z471 Aftercare following joint replacement surgery: Secondary | ICD-10-CM | POA: Diagnosis not present

## 2020-12-20 DIAGNOSIS — I152 Hypertension secondary to endocrine disorders: Secondary | ICD-10-CM | POA: Diagnosis not present

## 2020-12-20 DIAGNOSIS — G5603 Carpal tunnel syndrome, bilateral upper limbs: Secondary | ICD-10-CM | POA: Diagnosis not present

## 2020-12-20 DIAGNOSIS — E1169 Type 2 diabetes mellitus with other specified complication: Secondary | ICD-10-CM | POA: Diagnosis not present

## 2020-12-20 DIAGNOSIS — Z7982 Long term (current) use of aspirin: Secondary | ICD-10-CM | POA: Diagnosis not present

## 2020-12-20 DIAGNOSIS — Z7984 Long term (current) use of oral hypoglycemic drugs: Secondary | ICD-10-CM | POA: Diagnosis not present

## 2020-12-20 DIAGNOSIS — E1159 Type 2 diabetes mellitus with other circulatory complications: Secondary | ICD-10-CM | POA: Diagnosis not present

## 2020-12-20 DIAGNOSIS — E1151 Type 2 diabetes mellitus with diabetic peripheral angiopathy without gangrene: Secondary | ICD-10-CM | POA: Diagnosis not present

## 2020-12-24 ENCOUNTER — Telehealth: Payer: Self-pay

## 2020-12-24 NOTE — Telephone Encounter (Signed)
Transition Care Management Follow-up Telephone Call Date of discharge and from where: 12/19/20 from [facility] How have you been since you were released from the hospital? "I been doing ok" Any questions or concerns? No  Items Reviewed: Did the pt receive and understand the discharge instructions provided? Yes  Medications obtained and verified? Yes  Other? No  Any new allergies since your discharge? No  Dietary orders reviewed? Yes Do you have support at home? Yes   Home Care and Equipment/Supplies: Were home health services ordered? yes If so, what is the name of the agency? [facility]  Has the agency set up a time to come to the patient's home? yes Were any new equipment or medical supplies ordered?  No What is the name of the medical supply agency? Not applicable Were you able to get the supplies/equipment? not applicable Do you have any questions related to the use of the equipment or supplies? No  Functional Questionnaire: (I = Independent and D = Dependent) ADLs:I  Bathing/Dressing- I  Meal Prep- D  Eating- I  Maintaining continence- I  Transferring/Ambulation- I  Managing Meds- I  Follow up appointments reviewed:  PCP Hospital f/u appt confirmed?  Not applicable Specialist Hospital f/u appt confirmed? Yes  Scheduled to see Dr. Magnus Ivan on 12/30/20 @ 1:00pm. Are transportation arrangements needed? No  If their condition worsens, is the pt aware to call PCP or go to the Emergency Dept.? Yes Was the patient provided with contact information for the PCP's office or ED? Yes Was to pt encouraged to call back with questions or concerns? Yes

## 2020-12-25 ENCOUNTER — Telehealth: Payer: Self-pay | Admitting: *Deleted

## 2020-12-25 ENCOUNTER — Other Ambulatory Visit: Payer: Self-pay | Admitting: Orthopaedic Surgery

## 2020-12-25 DIAGNOSIS — E1159 Type 2 diabetes mellitus with other circulatory complications: Secondary | ICD-10-CM | POA: Diagnosis not present

## 2020-12-25 DIAGNOSIS — G4733 Obstructive sleep apnea (adult) (pediatric): Secondary | ICD-10-CM | POA: Diagnosis not present

## 2020-12-25 DIAGNOSIS — Z96651 Presence of right artificial knee joint: Secondary | ICD-10-CM | POA: Diagnosis not present

## 2020-12-25 DIAGNOSIS — Z7984 Long term (current) use of oral hypoglycemic drugs: Secondary | ICD-10-CM | POA: Diagnosis not present

## 2020-12-25 DIAGNOSIS — G5603 Carpal tunnel syndrome, bilateral upper limbs: Secondary | ICD-10-CM | POA: Diagnosis not present

## 2020-12-25 DIAGNOSIS — I152 Hypertension secondary to endocrine disorders: Secondary | ICD-10-CM | POA: Diagnosis not present

## 2020-12-25 DIAGNOSIS — E1151 Type 2 diabetes mellitus with diabetic peripheral angiopathy without gangrene: Secondary | ICD-10-CM | POA: Diagnosis not present

## 2020-12-25 DIAGNOSIS — Z7982 Long term (current) use of aspirin: Secondary | ICD-10-CM | POA: Diagnosis not present

## 2020-12-25 DIAGNOSIS — E785 Hyperlipidemia, unspecified: Secondary | ICD-10-CM | POA: Diagnosis not present

## 2020-12-25 DIAGNOSIS — E1169 Type 2 diabetes mellitus with other specified complication: Secondary | ICD-10-CM | POA: Diagnosis not present

## 2020-12-25 DIAGNOSIS — Z471 Aftercare following joint replacement surgery: Secondary | ICD-10-CM | POA: Diagnosis not present

## 2020-12-25 DIAGNOSIS — Z96641 Presence of right artificial hip joint: Secondary | ICD-10-CM | POA: Diagnosis not present

## 2020-12-25 DIAGNOSIS — F17210 Nicotine dependence, cigarettes, uncomplicated: Secondary | ICD-10-CM | POA: Diagnosis not present

## 2020-12-25 MED ORDER — OXYCODONE HCL 5 MG PO TABS: 5.0000 mg | ORAL_TABLET | Freq: Four times a day (QID) | ORAL | 0 refills | Status: DC | PRN

## 2020-12-25 NOTE — Telephone Encounter (Signed)
7 day Ortho bundle call to patient. Patient requested refill of pain medication. Thanks.

## 2020-12-28 DIAGNOSIS — E1151 Type 2 diabetes mellitus with diabetic peripheral angiopathy without gangrene: Secondary | ICD-10-CM | POA: Diagnosis not present

## 2020-12-28 DIAGNOSIS — E785 Hyperlipidemia, unspecified: Secondary | ICD-10-CM | POA: Diagnosis not present

## 2020-12-28 DIAGNOSIS — Z96651 Presence of right artificial knee joint: Secondary | ICD-10-CM | POA: Diagnosis not present

## 2020-12-28 DIAGNOSIS — Z7982 Long term (current) use of aspirin: Secondary | ICD-10-CM | POA: Diagnosis not present

## 2020-12-28 DIAGNOSIS — Z471 Aftercare following joint replacement surgery: Secondary | ICD-10-CM | POA: Diagnosis not present

## 2020-12-28 DIAGNOSIS — G5603 Carpal tunnel syndrome, bilateral upper limbs: Secondary | ICD-10-CM | POA: Diagnosis not present

## 2020-12-28 DIAGNOSIS — E1169 Type 2 diabetes mellitus with other specified complication: Secondary | ICD-10-CM | POA: Diagnosis not present

## 2020-12-28 DIAGNOSIS — F17210 Nicotine dependence, cigarettes, uncomplicated: Secondary | ICD-10-CM | POA: Diagnosis not present

## 2020-12-28 DIAGNOSIS — G4733 Obstructive sleep apnea (adult) (pediatric): Secondary | ICD-10-CM | POA: Diagnosis not present

## 2020-12-28 DIAGNOSIS — I152 Hypertension secondary to endocrine disorders: Secondary | ICD-10-CM | POA: Diagnosis not present

## 2020-12-28 DIAGNOSIS — Z7984 Long term (current) use of oral hypoglycemic drugs: Secondary | ICD-10-CM | POA: Diagnosis not present

## 2020-12-28 DIAGNOSIS — Z96641 Presence of right artificial hip joint: Secondary | ICD-10-CM | POA: Diagnosis not present

## 2020-12-28 DIAGNOSIS — E1159 Type 2 diabetes mellitus with other circulatory complications: Secondary | ICD-10-CM | POA: Diagnosis not present

## 2020-12-30 ENCOUNTER — Encounter: Payer: Self-pay | Admitting: Orthopaedic Surgery

## 2020-12-30 ENCOUNTER — Ambulatory Visit (INDEPENDENT_AMBULATORY_CARE_PROVIDER_SITE_OTHER): Payer: Medicare Other | Admitting: Orthopaedic Surgery

## 2020-12-30 ENCOUNTER — Other Ambulatory Visit: Payer: Self-pay

## 2020-12-30 DIAGNOSIS — Z96641 Presence of right artificial hip joint: Secondary | ICD-10-CM

## 2020-12-30 MED ORDER — DOXYCYCLINE HYCLATE 100 MG PO TABS: 100.0000 mg | ORAL_TABLET | Freq: Two times a day (BID) | ORAL | 0 refills | Status: DC

## 2020-12-30 NOTE — Progress Notes (Signed)
The patient is 2 weeks tomorrow status post a right total hip arthroplasty.  She says her incision has been weeping quite a bit.  On exam there is no redness.  There is certainly some serous drainage.  I was able to aspirate at least 80 cc of a seroma from around the hip and this helped quite a bit.  I am not can take out the staples today.  I applied some Bactroban ointment over the incision and a new Aquacel dressing.  I would like to see her back in just 1 week to reassess her incision.  She will change the dressing as needed.  I will prophylactically put her on doxycycline as well.

## 2021-01-01 ENCOUNTER — Telehealth: Payer: Self-pay | Admitting: *Deleted

## 2021-01-01 DIAGNOSIS — I152 Hypertension secondary to endocrine disorders: Secondary | ICD-10-CM | POA: Diagnosis not present

## 2021-01-01 DIAGNOSIS — Z96641 Presence of right artificial hip joint: Secondary | ICD-10-CM | POA: Diagnosis not present

## 2021-01-01 DIAGNOSIS — Z7982 Long term (current) use of aspirin: Secondary | ICD-10-CM | POA: Diagnosis not present

## 2021-01-01 DIAGNOSIS — F17210 Nicotine dependence, cigarettes, uncomplicated: Secondary | ICD-10-CM | POA: Diagnosis not present

## 2021-01-01 DIAGNOSIS — Z7984 Long term (current) use of oral hypoglycemic drugs: Secondary | ICD-10-CM | POA: Diagnosis not present

## 2021-01-01 DIAGNOSIS — E785 Hyperlipidemia, unspecified: Secondary | ICD-10-CM | POA: Diagnosis not present

## 2021-01-01 DIAGNOSIS — E1169 Type 2 diabetes mellitus with other specified complication: Secondary | ICD-10-CM | POA: Diagnosis not present

## 2021-01-01 DIAGNOSIS — G5603 Carpal tunnel syndrome, bilateral upper limbs: Secondary | ICD-10-CM | POA: Diagnosis not present

## 2021-01-01 DIAGNOSIS — Z471 Aftercare following joint replacement surgery: Secondary | ICD-10-CM | POA: Diagnosis not present

## 2021-01-01 DIAGNOSIS — E1159 Type 2 diabetes mellitus with other circulatory complications: Secondary | ICD-10-CM | POA: Diagnosis not present

## 2021-01-01 DIAGNOSIS — Z96651 Presence of right artificial knee joint: Secondary | ICD-10-CM | POA: Diagnosis not present

## 2021-01-01 DIAGNOSIS — G4733 Obstructive sleep apnea (adult) (pediatric): Secondary | ICD-10-CM | POA: Diagnosis not present

## 2021-01-01 DIAGNOSIS — E1151 Type 2 diabetes mellitus with diabetic peripheral angiopathy without gangrene: Secondary | ICD-10-CM | POA: Diagnosis not present

## 2021-01-01 NOTE — Telephone Encounter (Signed)
Ortho bundle 14 day call completed. 

## 2021-01-03 DIAGNOSIS — Z96641 Presence of right artificial hip joint: Secondary | ICD-10-CM | POA: Diagnosis not present

## 2021-01-03 DIAGNOSIS — Z96651 Presence of right artificial knee joint: Secondary | ICD-10-CM | POA: Diagnosis not present

## 2021-01-03 DIAGNOSIS — Z7982 Long term (current) use of aspirin: Secondary | ICD-10-CM | POA: Diagnosis not present

## 2021-01-03 DIAGNOSIS — G5603 Carpal tunnel syndrome, bilateral upper limbs: Secondary | ICD-10-CM | POA: Diagnosis not present

## 2021-01-03 DIAGNOSIS — E785 Hyperlipidemia, unspecified: Secondary | ICD-10-CM | POA: Diagnosis not present

## 2021-01-03 DIAGNOSIS — I152 Hypertension secondary to endocrine disorders: Secondary | ICD-10-CM | POA: Diagnosis not present

## 2021-01-03 DIAGNOSIS — Z7984 Long term (current) use of oral hypoglycemic drugs: Secondary | ICD-10-CM | POA: Diagnosis not present

## 2021-01-03 DIAGNOSIS — G4733 Obstructive sleep apnea (adult) (pediatric): Secondary | ICD-10-CM | POA: Diagnosis not present

## 2021-01-03 DIAGNOSIS — Z471 Aftercare following joint replacement surgery: Secondary | ICD-10-CM | POA: Diagnosis not present

## 2021-01-03 DIAGNOSIS — F17210 Nicotine dependence, cigarettes, uncomplicated: Secondary | ICD-10-CM | POA: Diagnosis not present

## 2021-01-03 DIAGNOSIS — E1151 Type 2 diabetes mellitus with diabetic peripheral angiopathy without gangrene: Secondary | ICD-10-CM | POA: Diagnosis not present

## 2021-01-03 DIAGNOSIS — E1159 Type 2 diabetes mellitus with other circulatory complications: Secondary | ICD-10-CM | POA: Diagnosis not present

## 2021-01-03 DIAGNOSIS — E1169 Type 2 diabetes mellitus with other specified complication: Secondary | ICD-10-CM | POA: Diagnosis not present

## 2021-01-06 ENCOUNTER — Encounter: Payer: Self-pay | Admitting: Physician Assistant

## 2021-01-06 ENCOUNTER — Other Ambulatory Visit: Payer: Self-pay

## 2021-01-06 ENCOUNTER — Ambulatory Visit (INDEPENDENT_AMBULATORY_CARE_PROVIDER_SITE_OTHER): Payer: Medicare Other | Admitting: Physician Assistant

## 2021-01-06 DIAGNOSIS — Z96641 Presence of right artificial hip joint: Secondary | ICD-10-CM

## 2021-01-06 MED ORDER — DOXYCYCLINE HYCLATE 100 MG PO TABS: 100.0000 mg | ORAL_TABLET | Freq: Two times a day (BID) | ORAL | 0 refills | Status: DC

## 2021-01-06 MED ORDER — OXYCODONE HCL 5 MG PO TABS: 5.0000 mg | ORAL_TABLET | Freq: Four times a day (QID) | ORAL | 0 refills | Status: DC | PRN

## 2021-01-06 NOTE — Progress Notes (Signed)
HPI: Ms. Oconnor returns today follow-up of her right hip.  She is status post right total hip arthroplasty 12/17/2020.  She is ambulating with a walker.  She is had no fevers chills.  She is on doxycycline.  She feels that her incision is looking better.  She is asking for refill on her oxycodone.  Physical exam: General well-developed well-nourished female is able to get on and off the exam table on her own.  She does have a slight antalgic gait and uses a walker to ambulate.  Right hip surgical incision staples well approximate the incision.  There is some sloughing of the skin mid incision and positive seroma 35 cc of serous sanguinous fluids obtained patient tolerates well.  Staples removed Steri-Strips applied the areas of the sloughing skin are left open small amount of Bactroban is applied a Band-Aid needs to be placed over these wounds.  Impression: Status post right total hip arthroplasty 12/17/2020  Plan: Have her follow-up with [place] in a week to see how she is doing overall.  I like her to begin washing the incision with antibacterial soap daily trying to completely and then placed in a small amount of Bactroban over the areas of skin wound breakdown.  She is given extra Steri-Strips to apply to the rest of the wound if need be.  Steri-Strips should stay on for the next 4 to 5 days.  She will try to leave Band-Aids off of the areas wound breakdown is much as possible.  Questions were encouraged and answered at length.  Refill on her doxycycline and oxycodone were given today.

## 2021-01-07 DIAGNOSIS — E1159 Type 2 diabetes mellitus with other circulatory complications: Secondary | ICD-10-CM | POA: Diagnosis not present

## 2021-01-07 DIAGNOSIS — Z7984 Long term (current) use of oral hypoglycemic drugs: Secondary | ICD-10-CM | POA: Diagnosis not present

## 2021-01-07 DIAGNOSIS — F17210 Nicotine dependence, cigarettes, uncomplicated: Secondary | ICD-10-CM | POA: Diagnosis not present

## 2021-01-07 DIAGNOSIS — E1169 Type 2 diabetes mellitus with other specified complication: Secondary | ICD-10-CM | POA: Diagnosis not present

## 2021-01-07 DIAGNOSIS — E785 Hyperlipidemia, unspecified: Secondary | ICD-10-CM | POA: Diagnosis not present

## 2021-01-07 DIAGNOSIS — E1151 Type 2 diabetes mellitus with diabetic peripheral angiopathy without gangrene: Secondary | ICD-10-CM | POA: Diagnosis not present

## 2021-01-07 DIAGNOSIS — Z96651 Presence of right artificial knee joint: Secondary | ICD-10-CM | POA: Diagnosis not present

## 2021-01-07 DIAGNOSIS — I152 Hypertension secondary to endocrine disorders: Secondary | ICD-10-CM | POA: Diagnosis not present

## 2021-01-07 DIAGNOSIS — Z7982 Long term (current) use of aspirin: Secondary | ICD-10-CM | POA: Diagnosis not present

## 2021-01-07 DIAGNOSIS — G4733 Obstructive sleep apnea (adult) (pediatric): Secondary | ICD-10-CM | POA: Diagnosis not present

## 2021-01-07 DIAGNOSIS — Z471 Aftercare following joint replacement surgery: Secondary | ICD-10-CM | POA: Diagnosis not present

## 2021-01-07 DIAGNOSIS — G5603 Carpal tunnel syndrome, bilateral upper limbs: Secondary | ICD-10-CM | POA: Diagnosis not present

## 2021-01-07 DIAGNOSIS — Z96641 Presence of right artificial hip joint: Secondary | ICD-10-CM | POA: Diagnosis not present

## 2021-01-13 ENCOUNTER — Other Ambulatory Visit: Payer: Self-pay

## 2021-01-13 ENCOUNTER — Encounter: Payer: Self-pay | Admitting: Physician Assistant

## 2021-01-13 ENCOUNTER — Ambulatory Visit (INDEPENDENT_AMBULATORY_CARE_PROVIDER_SITE_OTHER): Payer: Medicare Other | Admitting: Physician Assistant

## 2021-01-13 DIAGNOSIS — Z96641 Presence of right artificial hip joint: Secondary | ICD-10-CM

## 2021-01-13 NOTE — Progress Notes (Signed)
HPI: Jennifer [name] returns today for follow-up of her right total hip arthroplasty.  This is an incision check.  She is status post right total hip arthroplasty 12/18/2018.  She still walking with a walker.  She states she just feels as if she cannot trust the hip.  She has had no fevers or chills.  Physical exam: General well-developed well-nourished female no acute distress.   Right hip: No seroma.  Mid to distal incision 1-1/2 inch in length area of fibrinous tissue.  No signs of overall infection.  Right calf supple nontender.  Good range of motion of the right hip.  Ambulates with a rolling walker nonantalgic gait.  Impression: Status post right total hip arthroplasty 12/17/2020  Plan: She will continue to wash the incision daily with antibacterial soap and apply a small amount of Bactroban.  Finish the remaining doxycycline.  Have her follow-up with [place] in 2 weeks sooner if there is any questions concerns.

## 2021-01-14 ENCOUNTER — Ambulatory Visit (INDEPENDENT_AMBULATORY_CARE_PROVIDER_SITE_OTHER): Payer: Medicare Other | Admitting: Licensed Clinical Social Worker

## 2021-01-14 DIAGNOSIS — M1811 Unilateral primary osteoarthritis of first carpometacarpal joint, right hand: Secondary | ICD-10-CM

## 2021-01-14 DIAGNOSIS — E1169 Type 2 diabetes mellitus with other specified complication: Secondary | ICD-10-CM

## 2021-01-14 DIAGNOSIS — G4733 Obstructive sleep apnea (adult) (pediatric): Secondary | ICD-10-CM

## 2021-01-14 DIAGNOSIS — I152 Hypertension secondary to endocrine disorders: Secondary | ICD-10-CM

## 2021-01-14 DIAGNOSIS — Z9989 Dependence on other enabling machines and devices: Secondary | ICD-10-CM

## 2021-01-14 DIAGNOSIS — E1151 Type 2 diabetes mellitus with diabetic peripheral angiopathy without gangrene: Secondary | ICD-10-CM

## 2021-01-14 DIAGNOSIS — Z96651 Presence of right artificial knee joint: Secondary | ICD-10-CM

## 2021-01-14 DIAGNOSIS — E1159 Type 2 diabetes mellitus with other circulatory complications: Secondary | ICD-10-CM

## 2021-01-14 DIAGNOSIS — I739 Peripheral vascular disease, unspecified: Secondary | ICD-10-CM

## 2021-01-14 NOTE — Patient Instructions (Signed)
Visit Information  PATIENT GOALS:  Goals Addressed             This Visit's Progress    Find Help in My Community;Manage mobility needs; manage pain needs       Timeframe:  Short-Term Goal Priority:  Medium Progress: On Track Start Date:   12/04/20                          Expected End Date:  04/09/21              Follow Up Date : 02/28/21 at 3:30 PM   Find Help in My Community (Patient) Patient needs information about community resources to help her with mobility challenges, pain management, and to help her with completion of daily ADLs  Why is this important?   Knowing how and where to find help for yourself or family in your neighborhood and community is an important skill.  You will want to take some steps to learn how.    Patient Strengths: Takes medications as prescribed Attends scheduled medical appointments Self advocates as needed Has family support  Patient Deficits Some Pain issues Mobility challenges Challenges in doing ADLs  Patient Goals:  Attend scheduled medical appointments Take time for rest and relaxation and enjoyment of hobbies Advocate for self and for understanding of her medical conditions Talk with RNCM to address nursing needs Talk with LCSW about community resources of possible benefit to client -  Follow Up Plan: LCSW to call client on 02/28/21 at 3:30 PM to assess client needs      [name].Brydan Downard MSW, LCSW Licensed Clinical Social Worker [facility] Care Management [phone]

## 2021-01-14 NOTE — Chronic Care Management (AMB) (Signed)
Chronic Care Management    Clinical Social Work Note  01/14/2021 Name: Jennifer [name] MRN: [id number] DOB: [birth year]  Jennifer [name] is a 70 y.o. year old female who is a primary care patient of Raliegh Ip, DO. The CCM team was consulted to assist the patient with chronic disease management and/or care coordination needs related to: [employer] .   Engaged with patient by telephone for follow up visit in response to provider referral for social work chronic care management and care coordination services.   Consent to Services:  The patient was given information about Chronic Care Management services, agreed to services, and gave verbal consent prior to initiation of services.  Please see initial visit note for detailed documentation.   Patient agreed to services and consent obtained.   Assessment: Review of patient past medical history, allergies, medications, and health status, including review of relevant consultants reports was performed today as part of a comprehensive evaluation and provision of chronic care management and care coordination services.     SDOH (Social Determinants of Health) assessments and interventions performed:  SDOH Interventions    Flowsheet Row Most Recent Value  SDOH Interventions   Physical Activity Interventions Other (Comments)  [walking challenges,  uses a cane to help her walk]  Depression Interventions/Treatment  --  [informed client of LCSW support and of RNCM support]        Advanced Directives Status: See Vynca application for related entries.  CCM Care Plan  Allergies  Allergen Reactions   Oxycodone Nausea And Vomiting    Pt stated, "It makes me not want to eat"   Hydrocodone Itching    Outpatient Encounter Medications as of 01/14/2021  Medication Sig   acetaminophen (TYLENOL) 500 MG tablet Take 1,000 mg by mouth 2 (two) times daily as needed for mild pain or moderate pain.    aspirin 81 MG chewable tablet Chew 1  tablet (81 mg total) by mouth 2 (two) times daily.   atenolol (TENORMIN) 25 MG tablet Take 1 tablet (25 mg total) by mouth daily.   atorvastatin (LIPITOR) 40 MG tablet Take 1 tablet (40 mg total) by mouth at bedtime.   calcium carbonate (TUMS - DOSED IN MG ELEMENTAL CALCIUM) 500 MG chewable tablet Chew 1,000 mg by mouth daily as needed for indigestion or heartburn.   Coenzyme Q-10 100 MG capsule Take 2 capsules (200 mg total) by mouth daily. (Patient taking differently: Take 100 mg by mouth daily.)   doxycycline (VIBRA-TABS) 100 MG tablet Take 1 tablet (100 mg total) by mouth 2 (two) times daily.   magnesium hydroxide (MILK OF MAGNESIA) 400 MG/5ML suspension Take 15 mLs by mouth daily as needed for mild constipation.   metFORMIN (GLUCOPHAGE) 500 MG tablet Take 1 tablet (500 mg total) by mouth daily with breakfast.   methocarbamol (ROBAXIN) 500 MG tablet Take 1 tablet (500 mg total) by mouth every 6 (six) hours as needed for muscle spasms.   Multiple Vitamins-Minerals (ADULT GUMMY PO) Take 1 capsule by mouth daily.   ondansetron (ZOFRAN ODT) 4 MG disintegrating tablet Take 1 tablet (4 mg total) by mouth every 8 (eight) hours as needed for nausea or vomiting.   oxyCODONE (OXY IR/ROXICODONE) 5 MG immediate release tablet Take 1 tablet (5 mg total) by mouth every 6 (six) hours as needed for moderate pain (pain score 4-6).   triamterene-hydrochlorothiazide (MAXZIDE-25) 37.5-25 MG tablet Take 1 tablet by mouth daily.   No facility-administered encounter medications on file as of 01/14/2021.  Patient Active Problem List   Diagnosis Date Noted   Unilateral primary osteoarthritis, right hip 12/17/2020   Status post total replacement of right hip 12/17/2020   Hip pain 07/24/2020   OSA on CPAP 04/04/2019   Peripheral arterial disease (HCC) 09/21/2017   Type 2 diabetes, with peripheral circulatory disorder not at goal [facility]) 08/16/2017   Chronic pain of left ankle 08/11/2017   Bilateral carpal tunnel  syndrome 08/11/2017   Hypertension associated with diabetes (HCC) 08/11/2017   Morbid obesity (HCC) 08/11/2017   Localized primary osteoarthritis of left lower leg 08/11/2017   Hyperlipidemia associated with type 2 diabetes mellitus (HCC) 08/11/2017   Tobacco use disorder 08/11/2017   History of adenomatous polyp of colon 07/23/2016   S/P total knee arthroplasty 02/12/2014    Conditions to be addressed/monitored: monitor client completion of daily ADLs  Care Plan : LCSW care plan  Updates made by Isaiah Blakes, LCSW since 01/14/2021 12:00 AM     Problem: Coping Skills (General Plan of Care)      Goal: Learn more about community resources to help client with mobility improvemet and help client with completion of daily ADLs   Start Date: 12/04/2020  Expected End Date: 04/10/2021  This Visit's Progress: On track  Recent Progress: On track  Priority: Medium  Note:   Current barriers:   Patient in need of assistance with connecting to community resources for help as needed with mobility improvement of client and to help client complete daily ADLs Patient is unable to independently navigate community resource options without care coordination support Mobility challenges Challenges in managing multiple medical conditions  Clinical Goals:  patient will work with SW in next 30 days  to address concerns related to mobility challenges of client  Patient will work with SW in next 30 days to address concerns related to ADLs completion of client on daily basis  Patient will communicate with RNCM or LCSW as needed for CCM support in next 30 days  Clinical Interventions:  Collaboration with Raliegh Ip, DO regarding development and update of comprehensive plan of care as evidenced by provider attestation and co-signature Talked with client about mobility of client (has a cane and walker to use as needed) Talked with client about pain issues of client  Talked with client about  family support (spouse is supportive, daughter is supportive) Encouraged client to call RNCM as needed for nursing support Talked with client about her right hip surgery completed on 12/17/20.  Talked with client about home health services received by client. Client said when she discharged from the hospital, she  returned to her home with family support. She said home health physical therapy worked with her in the home until 01/03/21 when home health physical therapy discharged her from physical therapy services . Talked with her about sleeping issues of client Talked with client about appetite of client Talked with client about transport needs of client Talked with client about her follow up appointment with Orthopedist on 01/27/21.  Patient Strengths: Takes medications as prescribed Attends scheduled medical appointments Self advocates as needed Has family support  Patient Deficits Some Pain issues Mobility challenges Challenges in doing ADLs  Patient Goals:  Attend scheduled medical appointments Take time for rest and relaxation and enjoyment of hobbies Advocate for self and for understanding of her medical conditions Talk with RNCM to address nursing needs Talk with LCSW about community resources of possible benefit to client -  Follow Up Plan: LCSW to call client  on 02/28/21 at 3:30 PM to assess client needs       [name].Deniro Laymon MSW, LCSW Licensed Clinical Social Worker [facility] Care Management [phone]

## 2021-01-17 ENCOUNTER — Telehealth: Payer: Self-pay | Admitting: *Deleted

## 2021-01-17 DIAGNOSIS — M1811 Unilateral primary osteoarthritis of first carpometacarpal joint, right hand: Secondary | ICD-10-CM | POA: Diagnosis not present

## 2021-01-17 DIAGNOSIS — E785 Hyperlipidemia, unspecified: Secondary | ICD-10-CM

## 2021-01-17 DIAGNOSIS — E1169 Type 2 diabetes mellitus with other specified complication: Secondary | ICD-10-CM | POA: Diagnosis not present

## 2021-01-17 DIAGNOSIS — E1151 Type 2 diabetes mellitus with diabetic peripheral angiopathy without gangrene: Secondary | ICD-10-CM

## 2021-01-17 DIAGNOSIS — E1159 Type 2 diabetes mellitus with other circulatory complications: Secondary | ICD-10-CM | POA: Diagnosis not present

## 2021-01-17 DIAGNOSIS — I152 Hypertension secondary to endocrine disorders: Secondary | ICD-10-CM

## 2021-01-17 NOTE — Telephone Encounter (Signed)
Ortho bundle 30 day call completed. °

## 2021-01-20 ENCOUNTER — Other Ambulatory Visit: Payer: Self-pay | Admitting: Orthopaedic Surgery

## 2021-01-27 ENCOUNTER — Ambulatory Visit (INDEPENDENT_AMBULATORY_CARE_PROVIDER_SITE_OTHER): Payer: Medicare Other | Admitting: Physician Assistant

## 2021-01-27 ENCOUNTER — Encounter: Payer: Self-pay | Admitting: Physician Assistant

## 2021-01-27 ENCOUNTER — Other Ambulatory Visit: Payer: Self-pay

## 2021-01-27 DIAGNOSIS — Z96641 Presence of right artificial hip joint: Secondary | ICD-10-CM

## 2021-01-27 NOTE — Progress Notes (Deleted)
PATIENT: Jennifer [name] DOB: 07/27/1950  REASON FOR VISIT: follow up HISTORY FROM: patient PRIMARY NEUROLOGIST:   Virtual Visit via Video Note  I connected with Jennifer [name] on 01/28/21 at  1:30 PM EDT by a video enabled telemedicine application located remotely at [facility] and verified that I am speaking with the correct person using two identifiers who was located at their own home.   I discussed the limitations of evaluation and management by telemedicine and the availability of in person appointments. The patient expressed understanding and agreed to proceed.   PATIENT: Jennifer [name] DOB: 10/01/1950  REASON FOR VISIT: follow up HISTORY FROM: patient  HISTORY OF PRESENT ILLNESS: Today 01/28/21:    HISTORY 01/22/20:   [name] is a 70 year old female with a history of obstructive sleep apnea on CPAP.  Her download indicates that she use her machine nightly for compliance of 100%.  On average she uses her machine 6 hours and 50 minutes.  Her residual AHI is 2.3 on 9 cm of water with EPR 3.  Leak in the 95th percentile is 6.  She reports that the CPAP is working well.  She returns today for follow-up.   REVIEW OF SYSTEMS: Out of a complete 14 system review of symptoms, the patient complains only of the following symptoms, and all other reviewed systems are negative.  ALLERGIES: Allergies  Allergen Reactions   Oxycodone Nausea And Vomiting    Pt stated, "It makes me not want to eat"   Hydrocodone Itching    HOME MEDICATIONS: Outpatient Medications Prior to Visit  Medication Sig Dispense Refill   acetaminophen (TYLENOL) 500 MG tablet Take 1,000 mg by mouth 2 (two) times daily as needed for mild pain or moderate pain.      aspirin 81 MG chewable tablet Chew 1 tablet (81 mg total) by mouth 2 (two) times daily. 30 tablet 0   atenolol (TENORMIN) 25 MG tablet Take 1 tablet (25 mg total) by mouth daily. 90 tablet 3   atorvastatin (LIPITOR)  40 MG tablet Take 1 tablet (40 mg total) by mouth at bedtime. 90 tablet 3   calcium carbonate (TUMS - DOSED IN MG ELEMENTAL CALCIUM) 500 MG chewable tablet Chew 1,000 mg by mouth daily as needed for indigestion or heartburn.     Coenzyme Q-10 100 MG capsule Take 2 capsules (200 mg total) by mouth daily. (Patient taking differently: Take 100 mg by mouth daily.) 60 capsule 6   doxycycline (VIBRA-TABS) 100 MG tablet Take 1 tablet (100 mg total) by mouth 2 (two) times daily. 20 tablet 0   magnesium hydroxide (MILK OF MAGNESIA) 400 MG/5ML suspension Take 15 mLs by mouth daily as needed for mild constipation.     metFORMIN (GLUCOPHAGE) 500 MG tablet Take 1 tablet (500 mg total) by mouth daily with breakfast. 90 tablet 3   methocarbamol (ROBAXIN) 500 MG tablet TAKE 1 TABLET BY MOUTH EVERY 6 HOURS AS NEEDED FOR MUSCLE SPASM 40 tablet 0   Multiple Vitamins-Minerals (ADULT GUMMY PO) Take 1 capsule by mouth daily.     ondansetron (ZOFRAN ODT) 4 MG disintegrating tablet Take 1 tablet (4 mg total) by mouth every 8 (eight) hours as needed for nausea or vomiting. 20 tablet 0   oxyCODONE (OXY IR/ROXICODONE) 5 MG immediate release tablet Take 1 tablet (5 mg total) by mouth every 6 (six) hours as needed for moderate pain (pain score 4-6). 30 tablet 0   triamterene-hydrochlorothiazide (MAXZIDE-25) 37.5-25 MG  tablet Take 1 tablet by mouth daily. 90 tablet 3   No facility-administered medications prior to visit.    PAST MEDICAL HISTORY: Past Medical History:  Diagnosis Date   Arthritis    Dyspnea    Hyperlipidemia    Hypertension    Pre-diabetes    Sleep apnea     PAST SURGICAL HISTORY: Past Surgical History:  Procedure Laterality Date   ABDOMINAL HYSTERECTOMY     COLONOSCOPY N/A 11/25/2016   Procedure: COLONOSCOPY;  Surgeon: Malissa Hippo, MD;  Location: [facility];  Service: Endoscopy;  Laterality: N/A;  830   EYE SURGERY     FOOT SURGERY Left 2018   reconstructed tendon   KNEE ARTHROSCOPY      BIL   2010    LUNG BIOPSY     RIGHT 30 YRS AGO    TOTAL HIP ARTHROPLASTY Right 12/17/2020   Procedure: RIGHT TOTAL HIP ARTHROPLASTY ANTERIOR APPROACH;  Surgeon: Kathryne Hitch, MD;  Location: [facility] OR;  Service: Orthopedics;  Laterality: Right;   TOTAL KNEE ARTHROPLASTY Right 02/12/2014   Procedure: TOTAL KNEE ARTHROPLASTY;  Surgeon: Dannielle Huh, MD;  Location: [facility] OR;  Service: Orthopedics;  Laterality: Right;    FAMILY HISTORY: Family History  Problem Relation Age of Onset   Diabetes Mother    Stroke Mother    Alcohol abuse Father    Stroke Father    Colon cancer Neg Hx     SOCIAL HISTORY: Social History   Socioeconomic History   Marital status: Married    Spouse name: Not on file   Number of children: 3   Years of education: 12   Highest education level: Not on file  Occupational History   Occupation: CNa    Employer: [employer]  Tobacco Use   Smoking status: Every Day    Packs/day: 0.50    Years: 35.00    Pack years: 17.50    Types: Cigarettes   Smokeless tobacco: Never  Vaping Use   Vaping Use: Never used  Substance and Sexual Activity   Alcohol use: No   Drug use: No   Sexual activity: Yes  Other Topics Concern   Not on file  Social History Narrative   She lives with her husband   Social Determinants of Health   Financial Resource Strain: Low Risk    Difficulty of Paying Living Expenses: Not hard at all  Food Insecurity: No Food Insecurity   Worried About Programme researcher, broadcasting/film/video in the Last Year: Never true   Barista in the Last Year: Never true  Transportation Needs: No Transportation Needs   Lack of Transportation (Medical): No   Lack of Transportation (Non-Medical): No  Physical Activity: Insufficiently Active   Days of Exercise per Week: 1 day   Minutes of Exercise per Session: 10 min  Stress: No Stress Concern Present   Feeling of Stress : Not at all  Social Connections: Socially Integrated   Frequency of Communication with Friends  and Family: More than three times a week   Frequency of Social Gatherings with Friends and Family: More than three times a week   Attends Religious Services: More than 4 times per year   Active Member of [employer] or Organizations: Yes   Attends Engineer, structural: More than 4 times per year   Marital Status: Married  Catering manager Violence: Not At Risk   Fear of Current or Ex-Partner: No   Emotionally Abused: No   Physically  Abused: No   Sexually Abused: No      PHYSICAL EXAM Generalized: Well developed, in no acute distress   Neurological examination  Mentation: Alert oriented to time, place, history taking. Follows all commands speech and language fluent Cranial nerve II-XII:Extraocular movements were full. Facial symmetry noted. uvula tongue midline. Head turning and shoulder shrug  were normal and symmetric. Motor: Good strength throughout subjectively per patient Sensory: Sensory testing is intact to soft touch on all 4 extremities subjectively per patient Coordination: Cerebellar testing reveals good finger-nose-finger  Gait and station: Patient is able to stand from a seated position. gait is normal.  Reflexes: UTA  DIAGNOSTIC DATA (LABS, IMAGING, TESTING) - I reviewed patient records, labs, notes, testing and imaging myself where available.  Lab Results  Component Value Date   WBC 11.0 (H) 12/18/2020   HGB 11.9 (L) 12/18/2020   HCT 36.3 12/18/2020   MCV 89.2 12/18/2020   PLT 246 12/18/2020      Component Value Date/Time   NA 135 12/18/2020 0112   NA 140 09/27/2020 1411   K 3.5 12/18/2020 0112   CL 102 12/18/2020 0112   CO2 25 12/18/2020 0112   GLUCOSE 145 (H) 12/18/2020 0112   BUN 13 12/18/2020 0112   BUN 20 09/27/2020 1411   CREATININE 0.64 12/18/2020 0112   CALCIUM 9.1 12/18/2020 0112   PROT 7.6 09/27/2020 1411   ALBUMIN 4.5 09/27/2020 1411   AST 21 09/27/2020 1411   ALT 20 09/27/2020 1411   ALKPHOS 119 09/27/2020 1411   BILITOT <0.2  09/27/2020 1411   GFRNONAA >60 12/18/2020 0112   GFRAA 78 07/20/2019 1536   Lab Results  Component Value Date   CHOL 173 10/04/2020   HDL 37 (L) 10/04/2020   LDLCALC 81 10/04/2020   TRIG 341 (H) 10/04/2020   CHOLHDL 4.7 (H) 10/04/2020   Lab Results  Component Value Date   HGBA1C 6.8 09/27/2020   Lab Results  Component Value Date   VITAMINB12 616 07/20/2019   No results found for: TSH    ASSESSMENT AND PLAN 70 y.o. year old female  has a past medical history of Arthritis, Dyspnea, Hyperlipidemia, Hypertension, Pre-diabetes, and Sleep apnea. here with:  OSA on CPAP  CPAP compliance excellent Residual AHI is good Encouraged patient to continue using CPAP nightly and > 4 hours each night F/U in 1 year or sooner if needed  I spent 20 minutes of face-to-face and non-face-to-face time with patient.  This included previsit chart review, lab review, study review, order entry, electronic health record documentation, patient education.  Butch Penny, MSN, NP-C 01/28/2021, 12:55 PM [facility] [address], [address] [place], [place] [address] [phone]

## 2021-01-27 NOTE — Progress Notes (Signed)
HPI: [name] returns today for follow-up of her right total hip arthroplasty which was performed on 12/17/2020.  She is overall doing well.  She states she has been washing the incision with antibacterial soap.  She has no complaints in regards to her hip.  Physical exam: Right hip surgical incisions healing well no signs of infection.  Incision is completely healed.  She has good range of motion right hip without pain.  Dorsiflexion plantarflexion intact.  Ambulating with a cane.  Impression status post right total hip arthroplasty 12/17/2020  Plan: At this point in time we will have her follow-up with [place] in 4 to 4-1/2 months and we will obtain an [facility] pelvis and lateral view of her right hip at that time.  Should follow-up with [place] sooner if there is any questions or concerns.

## 2021-01-28 ENCOUNTER — Telehealth: Payer: Medicare Other | Admitting: Adult Health

## 2021-02-04 ENCOUNTER — Ambulatory Visit (INDEPENDENT_AMBULATORY_CARE_PROVIDER_SITE_OTHER): Payer: Medicare Other | Admitting: *Deleted

## 2021-02-04 DIAGNOSIS — E1151 Type 2 diabetes mellitus with diabetic peripheral angiopathy without gangrene: Secondary | ICD-10-CM

## 2021-02-04 DIAGNOSIS — E1169 Type 2 diabetes mellitus with other specified complication: Secondary | ICD-10-CM

## 2021-02-04 DIAGNOSIS — G4733 Obstructive sleep apnea (adult) (pediatric): Secondary | ICD-10-CM

## 2021-02-04 DIAGNOSIS — M1811 Unilateral primary osteoarthritis of first carpometacarpal joint, right hand: Secondary | ICD-10-CM

## 2021-02-04 NOTE — Chronic Care Management (AMB) (Addendum)
Chronic Care Management   CCM RN Visit Note  02/04/2021 Name: Jennifer [name] MRN: [id number] DOB: 1950-09-19  Subjective: Jennifer [name] is a 70 y.o. year old female who is a primary care patient of Raliegh Ip, DO. The care management team was consulted for assistance with disease management and care coordination needs.    Engaged with patient by telephone for follow up visit in response to provider referral for case management and/or care coordination services.   Consent to Services:  The patient was given information about Chronic Care Management services, agreed to services, and gave verbal consent prior to initiation of services.  Please see initial visit note for detailed documentation.   Patient agreed to services and verbal consent obtained.   Assessment: Review of patient past medical history, allergies, medications, health status, including review of consultants reports, laboratory and other test data, was performed as part of comprehensive evaluation and provision of chronic care management services.   SDOH (Social Determinants of Health) assessments and interventions performed:    CCM Care Plan  Allergies  Allergen Reactions   Oxycodone Nausea And Vomiting    Pt stated, "It makes me not want to eat"   Hydrocodone Itching    Outpatient Encounter Medications as of 02/04/2021  Medication Sig   acetaminophen (TYLENOL) 500 MG tablet Take 1,000 mg by mouth 2 (two) times daily as needed for mild pain or moderate pain.    aspirin 81 MG chewable tablet Chew 1 tablet (81 mg total) by mouth 2 (two) times daily.   atenolol (TENORMIN) 25 MG tablet Take 1 tablet (25 mg total) by mouth daily.   atorvastatin (LIPITOR) 40 MG tablet Take 1 tablet (40 mg total) by mouth at bedtime.   calcium carbonate (TUMS - DOSED IN MG ELEMENTAL CALCIUM) 500 MG chewable tablet Chew 1,000 mg by mouth daily as needed for indigestion or heartburn.   Coenzyme Q-10 100 MG capsule Take 2 capsules  (200 mg total) by mouth daily. (Patient taking differently: Take 100 mg by mouth daily.)   doxycycline (VIBRA-TABS) 100 MG tablet Take 1 tablet (100 mg total) by mouth 2 (two) times daily.   magnesium hydroxide (MILK OF MAGNESIA) 400 MG/5ML suspension Take 15 mLs by mouth daily as needed for mild constipation.   metFORMIN (GLUCOPHAGE) 500 MG tablet Take 1 tablet (500 mg total) by mouth daily with breakfast.   methocarbamol (ROBAXIN) 500 MG tablet TAKE 1 TABLET BY MOUTH EVERY 6 HOURS AS NEEDED FOR MUSCLE SPASM   Multiple Vitamins-Minerals (ADULT GUMMY PO) Take 1 capsule by mouth daily.   ondansetron (ZOFRAN ODT) 4 MG disintegrating tablet Take 1 tablet (4 mg total) by mouth every 8 (eight) hours as needed for nausea or vomiting.   oxyCODONE (OXY IR/ROXICODONE) 5 MG immediate release tablet Take 1 tablet (5 mg total) by mouth every 6 (six) hours as needed for moderate pain (pain score 4-6).   triamterene-hydrochlorothiazide (MAXZIDE-25) 37.5-25 MG tablet Take 1 tablet by mouth daily.   No facility-administered encounter medications on file as of 02/04/2021.    Patient Active Problem List   Diagnosis Date Noted   Unilateral primary osteoarthritis, right hip 12/17/2020   Status post total replacement of right hip 12/17/2020   Hip pain 07/24/2020   OSA on CPAP 04/04/2019   Peripheral arterial disease (HCC) 09/21/2017   Type 2 diabetes, with peripheral circulatory disorder not at goal [facility]) 08/16/2017   Chronic pain of left ankle 08/11/2017   Bilateral carpal tunnel syndrome 08/11/2017  Hypertension associated with diabetes (HCC) 08/11/2017   Morbid obesity (HCC) 08/11/2017   Localized primary osteoarthritis of left lower leg 08/11/2017   Hyperlipidemia associated with type 2 diabetes mellitus (HCC) 08/11/2017   Tobacco use disorder 08/11/2017   History of adenomatous polyp of colon 07/23/2016   S/P total knee arthroplasty 02/12/2014    Conditions to be addressed/monitored:HTN, DMII,  Osteoarthritis, and sleep apnea  Care Plan : [facility] Care Plan  Updates made by [name], RN since 02/04/2021 12:00 AM     Problem: Chronic Disease Management Needs   Priority: High  Onset Date: 02/04/2021     Long-Range Goal: Work with RN Care Manager Regarding Care Management and Care Coordination Associated with HLD, DM, OSA, and Arthritis   Start Date: 02/04/2021  Expected End Date: 02/04/2022  This Visit's Progress: On track  Priority: High  Note:   Current Barriers:  Chronic Disease Management support and education needs related to HLD, DMII, Osteoarthritis, and Sleep Apnea Limited mobility  RNCM Clinical Goal(s):  Patient will continue to work with RN Care Manager to address care management and care coordination needs related to  HLD, DMII, Osteoarthritis, and Sleep Apnea  through collaboration with RN Care manager, provider, and care team.  Patient will contact [facility] [phone] to reschedule yearly sleep apnea follow-up Patient will consider physical therapy for gait and strength training  Interventions: 1:1 collaboration with primary care provider regarding development and update of comprehensive plan of care as evidenced by provider attestation and co-signature Inter-disciplinary care team collaboration (see longitudinal plan of care) Evaluation of current treatment plan related to  self management and patient's adherence to plan as established by provider   Diabetes:  (Status: Condition stable. Not addressed this visit.) Lab Results  Component Value Date   HGBA1C 6.8 09/27/2020  Assessed patient's understanding of A1c goal: <7% Reviewed and discussed labs from 09/27/20 and provider recommendation to avoid NSAIDs and to maintain hydration Clarified that she can take Tylenol for pain but should stop taking ibuprofen Explained the difference in tylenol and NSAIDs and provided examples Advised to schedule a follow-up appointment with PCP in Dec 2022  and to repeat CMP at that time Reviewed medications with patient and discussed importance of medication adherence She is not taking diclofenac or vicodin and should not take diclofenac due to elev creatinine Discussed home blood sugar testing Encouraged to continue testing and recording blood sugar daily and to call PCP with any readings outside of recommended range Reinforced need for ADA/Carb Modified Diet Encouraged to increase activity level as tolerated   Osteoarthritis:  (Status: New goal.) Discussed recent follow-up with ortho s/p right hip replacement in Aug 2022 Discussed mobility and ambulation Continues to use a cane or walker. Feels more confident with the walker but is trying to walk more with the cane.  Had 2 weeks of home PT. PT did not assist with transitioning from walker, to cane, to ambulating without assistance Advised that outpatient PT for strength and gait training would be helpful Patient will consider Advised that a face-to-face visit with PCP would be needed to order PT. Patient stated understanding.  Discussed desire to walk without assistance but fear of falling Discussed fall prevention strategies Encouraged patient to continue walking with a cane until she feels more confident in her ability to walk without assistance Reviewed upcoming follow-up appt with ortho surgeon in 4 months Encouraged to reach out to ortho or PCP with any new or worsening symptoms   Hyperlipidemia:  (  Status: Condition stable. Not addressed this visit.) Lab Results  Component Value Date   CHOL 173 10/04/2020   HDL 37 (L) 10/04/2020   LDLCALC 81 10/04/2020   TRIG 341 (H) 10/04/2020   CHOLHDL 4.7 (H) 10/04/2020   Medication review performed; medication list updated in electronic medical record.  Chart reviewed includuing recent office visits and most recent lab results Lipid panel was abnormal on 09/27/20 because she wasn't fasting Reminded that an order is in for a repeat lipid  panel and that she should return to [facility] fasting to have this drawn Discussed current limitations on physical activity due to left knee and right hip pain Has an appointment scheduled with orthopedic surgeon to discuss hip replacement Low sodium as well as ADA/Carb modified diet recommended   Sleep Apnea:  (Status: New goal.) Discussed missed appointment for yearly follow-up with [facility] on 01/28/21.  Patient wasn't aware that she had a My Chart Video Visit scheduled Provided patient with [facility] telephone number [phone] and encouraged to call and reschedule Discussed sleep apnea management and use of CPAP Using at least 5 nights a week for at least 4 hours each night and most of the time longer Discussed quality of sleep.  Sleeps well with CPAP Encouraged to use nightly as directed for at least 4 hours  Encouraged to properly maintain machine and supplies for optimal fit and function   Patient Goals/Self-Care Activities: Patient will self administer medications as prescribed as evidenced by self report/primary caregiver report  Patient will attend all scheduled provider appointments as evidenced by clinician review of documented attendance to scheduled appointments and patient/caregiver report Patient will continue to perform ADL's independently as evidenced by patient/caregiver report Patient will continue to perform IADL's independently as evidenced by patient/caregiver report Patient will call provider office for new concerns or questions as evidenced by review of documented incoming telephone call notes and patient report Patient will call [facility] to schedule a yearly follow-up Patient will call RN Care Manager as needed [phone] Patient will consider outpatient PT for strength and gait training Patient will follow an ADA/Carb modified diet and will limit or eliminate intake of fried/greasy foods        Plan:Telephone follow up appointment with  care management team member scheduled for:  04/07/21 with RNCM The patient has been provided with contact information for the care management team and has been advised to call with any health related questions or concerns.   Demetrios Loll, BSN, RN-BC Embedded Chronic Care Manager [facility] Care Management Direct Dial: [phone]

## 2021-02-04 NOTE — Patient Instructions (Signed)
Visit Information    Patient Care Plan: [facility] Care Plan     Problem Identified: Chronic Disease Management Needs   Priority: High  Onset Date: 02/04/2021     Long-Range Goal: Work with RN Care Manager Regarding Care Management and Care Coordination Associated with HLD, DM, OSA, and Arthritis   Start Date: 02/04/2021  Expected End Date: 02/04/2022  This Visit's Progress: On track  Priority: High  Note:   Current Barriers:  Chronic Disease Management support and education needs related to HLD, DMII, Osteoarthritis, and Sleep Apnea Limited mobility  RNCM Clinical Goal(s):  Patient will continue to work with RN Care Manager to address care management and care coordination needs related to  HLD, DMII, Osteoarthritis, and Sleep Apnea  through collaboration with RN Care manager, provider, and care team.  Patient will contact [facility] [phone] to reschedule yearly sleep apnea follow-up Patient will consider physical therapy for gait and strength training  Interventions: 1:1 collaboration with primary care provider regarding development and update of comprehensive plan of care as evidenced by provider attestation and co-signature Inter-disciplinary care team collaboration (see longitudinal plan of care) Evaluation of current treatment plan related to  self management and patient's adherence to plan as established by provider   Diabetes:  (Status: Condition stable. Not addressed this visit.) Lab Results  Component Value Date   HGBA1C 6.8 09/27/2020  Assessed patient's understanding of A1c goal: <7% Reviewed and discussed labs from 09/27/20 and provider recommendation to avoid NSAIDs and to maintain hydration Clarified that she can take Tylenol for pain but should stop taking ibuprofen Explained the difference in tylenol and NSAIDs and provided examples Advised to schedule a follow-up appointment with PCP in Dec 2022 and to repeat CMP at that time Reviewed medications  with patient and discussed importance of medication adherence She is not taking diclofenac or vicodin and should not take diclofenac due to elev creatinine Discussed home blood sugar testing Encouraged to continue testing and recording blood sugar daily and to call PCP with any readings outside of recommended range Reinforced need for ADA/Carb Modified Diet Encouraged to increase activity level as tolerated   Osteoarthritis:  (Status: New goal.) Discussed recent follow-up with ortho s/p right hip replacement in Aug 2022 Discussed mobility and ambulation Continues to use a cane or walker. Feels more confident with the walker but is trying to walk more with the cane.  Had 2 weeks of home PT. PT did not assist with transitioning from walker, to cane, to ambulating without assistance Advised that outpatient PT for strength and gait training would be helpful Patient will consider Advised that a face-to-face visit with PCP would be needed to order PT. Patient stated understanding.  Discussed desire to walk without assistance but fear of falling Discussed fall prevention strategies Encouraged patient to continue walking with a cane until she feels more confident in her ability to walk without assistance Reviewed upcoming follow-up appt with ortho surgeon in 4 months Encouraged to reach out to ortho or PCP with any new or worsening symptoms   Hyperlipidemia:  (Status: Condition stable. Not addressed this visit.) Lab Results  Component Value Date   CHOL 173 10/04/2020   HDL 37 (L) 10/04/2020   LDLCALC 81 10/04/2020   TRIG 341 (H) 10/04/2020   CHOLHDL 4.7 (H) 10/04/2020   Medication review performed; medication list updated in electronic medical record.  Chart reviewed includuing recent office visits and most recent lab results Lipid panel was abnormal on 09/27/20 because she wasn't fasting  Reminded that an order is in for a repeat lipid panel and that she should return to [facility] fasting to have  this drawn Discussed current limitations on physical activity due to left knee and right hip pain Has an appointment scheduled with orthopedic surgeon to discuss hip replacement Low sodium as well as ADA/Carb modified diet recommended   Sleep Apnea:  (Status: New goal.) Discussed missed appointment for yearly follow-up with [facility] on 01/28/21.  Patient wasn't aware that she had a My Chart Video Visit scheduled Provided patient with [facility] telephone number [phone] and encouraged to call and reschedule Discussed sleep apnea management and use of CPAP Using at least 5 nights a week for at least 4 hours each night and most of the time longer Discussed quality of sleep.  Sleeps well with CPAP Encouraged to use nightly as directed for at least 4 hours  Encouraged to properly maintain machine and supplies for optimal fit and function   Patient Goals/Self-Care Activities: Patient will self administer medications as prescribed as evidenced by self report/primary caregiver report  Patient will attend all scheduled provider appointments as evidenced by clinician review of documented attendance to scheduled appointments and patient/caregiver report Patient will continue to perform ADL's independently as evidenced by patient/caregiver report Patient will continue to perform IADL's independently as evidenced by patient/caregiver report Patient will call provider office for new concerns or questions as evidenced by review of documented incoming telephone call notes and patient report Patient will call [facility] to schedule a yearly follow-up Patient will call RN Care Manager as needed [phone] Patient will consider outpatient PT for strength and gait training Patient will follow an ADA/Carb modified diet and will limit or eliminate intake of fried/greasy foods    Plan:Telephone follow up appointment with care management team member scheduled for:  04/07/21 with RNCM The  patient has been provided with contact information for the care management team and has been advised to call with any health related questions or concerns.   Demetrios Loll, BSN, RN-BC Embedded Chronic Care Manager [facility] Care Management Direct Dial: [phone]

## 2021-02-13 ENCOUNTER — Telehealth: Payer: Self-pay | Admitting: Family Medicine

## 2021-02-13 NOTE — Telephone Encounter (Signed)
Spoke with patient advised no notes in chart where we called she verbalized understanding

## 2021-02-17 DIAGNOSIS — E785 Hyperlipidemia, unspecified: Secondary | ICD-10-CM

## 2021-02-17 DIAGNOSIS — E1169 Type 2 diabetes mellitus with other specified complication: Secondary | ICD-10-CM

## 2021-02-17 DIAGNOSIS — E1151 Type 2 diabetes mellitus with diabetic peripheral angiopathy without gangrene: Secondary | ICD-10-CM | POA: Diagnosis not present

## 2021-02-17 DIAGNOSIS — M1811 Unilateral primary osteoarthritis of first carpometacarpal joint, right hand: Secondary | ICD-10-CM

## 2021-02-28 ENCOUNTER — Ambulatory Visit (INDEPENDENT_AMBULATORY_CARE_PROVIDER_SITE_OTHER): Payer: Medicare Other | Admitting: Licensed Clinical Social Worker

## 2021-02-28 DIAGNOSIS — I739 Peripheral vascular disease, unspecified: Secondary | ICD-10-CM

## 2021-02-28 DIAGNOSIS — I152 Hypertension secondary to endocrine disorders: Secondary | ICD-10-CM

## 2021-02-28 DIAGNOSIS — G4733 Obstructive sleep apnea (adult) (pediatric): Secondary | ICD-10-CM

## 2021-02-28 DIAGNOSIS — E1151 Type 2 diabetes mellitus with diabetic peripheral angiopathy without gangrene: Secondary | ICD-10-CM

## 2021-02-28 DIAGNOSIS — E1169 Type 2 diabetes mellitus with other specified complication: Secondary | ICD-10-CM

## 2021-02-28 DIAGNOSIS — E1159 Type 2 diabetes mellitus with other circulatory complications: Secondary | ICD-10-CM

## 2021-02-28 DIAGNOSIS — Z96651 Presence of right artificial knee joint: Secondary | ICD-10-CM

## 2021-02-28 DIAGNOSIS — M1811 Unilateral primary osteoarthritis of first carpometacarpal joint, right hand: Secondary | ICD-10-CM

## 2021-02-28 DIAGNOSIS — E785 Hyperlipidemia, unspecified: Secondary | ICD-10-CM

## 2021-02-28 NOTE — Chronic Care Management (AMB) (Signed)
Chronic Care Management    Clinical Social Work Note  02/28/2021 Name: Jennifer [name] MRN: [id number] DOB: 03/14/1951  Jennifer [name] is a 70 y.o. year old female who is a primary care patient of Raliegh Ip, DO. The CCM team was consulted to assist the patient with chronic disease management and/or care coordination needs related to: [employer] .   Engaged with patient by telephone for follow up visit in response to provider referral for social work chronic care management and care coordination services.   Consent to Services:  The patient was given information about Chronic Care Management services, agreed to services, and gave verbal consent prior to initiation of services.  Please see initial visit note for detailed documentation.   Patient agreed to services and consent obtained.   Assessment: Review of patient past medical history, allergies, medications, and health status, including review of relevant consultants reports was performed today as part of a comprehensive evaluation and provision of chronic care management and care coordination services.     SDOH (Social Determinants of Health) assessments and interventions performed:  SDOH Interventions    Flowsheet Row Most Recent Value  SDOH Interventions   Physical Activity Interventions Other (Comments)  [client has walking challenges,  she uses a cane to help her walk]  Stress Interventions Provide Counseling  [client has stress related to managing her health needs. client has stress related to health needs of her spouse]  Depression Interventions/Treatment  --  [informed client of LCSW support and of RNCM support]        Advanced Directives Status: See Vynca application for related entries.  CCM Care Plan  Allergies  Allergen Reactions   Oxycodone Nausea And Vomiting    Pt stated, "It makes me not want to eat"   Hydrocodone Itching    Outpatient Encounter Medications as of 02/28/2021  Medication  Sig   acetaminophen (TYLENOL) 500 MG tablet Take 1,000 mg by mouth 2 (two) times daily as needed for mild pain or moderate pain.    aspirin 81 MG chewable tablet Chew 1 tablet (81 mg total) by mouth 2 (two) times daily.   atenolol (TENORMIN) 25 MG tablet Take 1 tablet (25 mg total) by mouth daily.   atorvastatin (LIPITOR) 40 MG tablet Take 1 tablet (40 mg total) by mouth at bedtime.   calcium carbonate (TUMS - DOSED IN MG ELEMENTAL CALCIUM) 500 MG chewable tablet Chew 1,000 mg by mouth daily as needed for indigestion or heartburn.   Coenzyme Q-10 100 MG capsule Take 2 capsules (200 mg total) by mouth daily. (Patient taking differently: Take 100 mg by mouth daily.)   doxycycline (VIBRA-TABS) 100 MG tablet Take 1 tablet (100 mg total) by mouth 2 (two) times daily.   magnesium hydroxide (MILK OF MAGNESIA) 400 MG/5ML suspension Take 15 mLs by mouth daily as needed for mild constipation.   metFORMIN (GLUCOPHAGE) 500 MG tablet Take 1 tablet (500 mg total) by mouth daily with breakfast.   methocarbamol (ROBAXIN) 500 MG tablet TAKE 1 TABLET BY MOUTH EVERY 6 HOURS AS NEEDED FOR MUSCLE SPASM   Multiple Vitamins-Minerals (ADULT GUMMY PO) Take 1 capsule by mouth daily.   ondansetron (ZOFRAN ODT) 4 MG disintegrating tablet Take 1 tablet (4 mg total) by mouth every 8 (eight) hours as needed for nausea or vomiting.   oxyCODONE (OXY IR/ROXICODONE) 5 MG immediate release tablet Take 1 tablet (5 mg total) by mouth every 6 (six) hours as needed for moderate pain (pain score 4-6).  triamterene-hydrochlorothiazide (MAXZIDE-25) 37.5-25 MG tablet Take 1 tablet by mouth daily.   No facility-administered encounter medications on file as of 02/28/2021.    Patient Active Problem List   Diagnosis Date Noted   Unilateral primary osteoarthritis, right hip 12/17/2020   Status post total replacement of right hip 12/17/2020   Hip pain 07/24/2020   OSA on CPAP 04/04/2019   Peripheral arterial disease (HCC) 09/21/2017    Type 2 diabetes, with peripheral circulatory disorder not at goal [facility]) 08/16/2017   Chronic pain of left ankle 08/11/2017   Bilateral carpal tunnel syndrome 08/11/2017   Hypertension associated with diabetes (HCC) 08/11/2017   Morbid obesity (HCC) 08/11/2017   Localized primary osteoarthritis of left lower leg 08/11/2017   Hyperlipidemia associated with type 2 diabetes mellitus (HCC) 08/11/2017   Tobacco use disorder 08/11/2017   History of adenomatous polyp of colon 07/23/2016   S/P total knee arthroplasty 02/12/2014    Conditions to be addressed/monitored: monitor client mobility needs; monitor client completion of ADLs  Care Plan : LCSW care plan  Updates made by Isaiah Blakes, LCSW since 02/28/2021 12:00 AM     Problem: Coping Skills (General Plan of Care)      Goal: Learn more about community resources to help client with mobility improvemet and help client with completion of daily ADLs   Start Date: 02/28/2021  Expected End Date: 05/29/2021  This Visit's Progress: On track  Recent Progress: On track  Priority: Medium  Note:   Current barriers:   Patient in need of assistance with connecting to community resources for help as needed with mobility improvement of client and to help client complete daily ADLs Patient is unable to independently navigate community resource options without care coordination support Mobility challenges Challenges in managing multiple medical conditions  Clinical Goals:  patient will work with SW in next 30 days  to address concerns related to mobility challenges of client  Patient will work with SW in next 30 days to address concerns related to ADLs completion of client on daily basis  Patient will communicate with RNCM or LCSW as needed for CCM support in next 30 days  Clinical Interventions:  Collaboration with Raliegh Ip, DO regarding development and update of comprehensive plan of care as evidenced by provider attestation and  co-signature Discussed mobility of client (has a cane and walker to use as needed) Discussed pain issues of client. Reviewed family support  for client (spouse is supportive, daughter is supportive,son is supportive) Encouraged client to call RNCM as needed for nursing support Discussed physical therapy sessions received by client a few months ago.  RNCM Demetrios Loll had mentioned possible Outpatient Physical Therapy for client.  Client is still thinking about this option. She is using cane to walk but feels that she could still be at risk for falls. She said she is no longer using a walker at present, only using cane to help her walk Discussed with client her right hip surgery completed on 12/17/20.  Reviewed sleeping issues of client. She said she is using C-PAP to help her sleep. She said she is sleeping better Reviewed transport needs. She said she drives herself to local appointments or to do errands. Her family also helps her with transport as needed. Provided counseling support for client Discussed health needs of her spouse. She said she is concerned over health needs of her spouse.  She said her spouse has lost weight and medical providers are not sure of what is causing  his current health issues. This medical situation with her spouse is causing some stress for her Reviewed medication procurement of client  Patient Strengths: Takes medications as prescribed Attends scheduled medical appointments Self advocates as needed Has family support  Patient Deficits Some Pain issues Mobility challenges Challenges in doing ADLs  Patient Goals:  Attend scheduled medical appointments Take time for rest and relaxation and enjoyment of hobbies Advocate for self and for understanding of her medical conditions Talk with RNCM to address nursing needs Talk with LCSW about community resources of possible benefit to client -  Follow Up Plan: LCSW to call client on 04/30/21 at 2:00 PM to assess  client needs       [name].Despina Boan MSW, LCSW Licensed Clinical Social Worker [facility] Care Management [phone]

## 2021-02-28 NOTE — Patient Instructions (Addendum)
Visit Information  Patient Goals:  Find Help in My community. Patient needs information about community resources to help her with mobility challenges, pain management and to help her with completion of daily ADLs  Timeframe:  Short-Term Goal Priority:  Medium Progress: On Track Start Date:   02/28/21                          Expected End Date:  05/29/21              Follow Up Date : 04/30/21 at 2:00 PM   Find Help in My Community (Patient) Patient needs information about community resources to help her with mobility challenges, pain management, and to help her with completion of daily ADLs  Why is this important?   Knowing how and where to find help for yourself or family in your neighborhood and community is an important skill.  You will want to take some steps to learn how.    Patient Strengths: Takes medications as prescribed Attends scheduled medical appointments Self advocates as needed Has family support  Patient Deficits Some Pain issues Mobility challenges Challenges in doing ADLs  Patient Goals:  Attend scheduled medical appointments Take time for rest and relaxation and enjoyment of hobbies Advocate for self and for understanding of her medical conditions Talk with RNCM to address nursing needs Talk with LCSW about community resources of possible benefit to client -  Follow Up Plan: LCSW to call client on 04/30/21 at 2:00 PM to assess client needs    [name].Sandee Bernath MSW, LCSW Licensed Clinical Social Worker [facility] Care Management [phone]

## 2021-03-12 DIAGNOSIS — G4733 Obstructive sleep apnea (adult) (pediatric): Secondary | ICD-10-CM | POA: Diagnosis not present

## 2021-03-19 DIAGNOSIS — E1169 Type 2 diabetes mellitus with other specified complication: Secondary | ICD-10-CM

## 2021-03-19 DIAGNOSIS — E785 Hyperlipidemia, unspecified: Secondary | ICD-10-CM

## 2021-03-19 DIAGNOSIS — M1811 Unilateral primary osteoarthritis of first carpometacarpal joint, right hand: Secondary | ICD-10-CM

## 2021-03-19 DIAGNOSIS — E1151 Type 2 diabetes mellitus with diabetic peripheral angiopathy without gangrene: Secondary | ICD-10-CM

## 2021-03-19 DIAGNOSIS — E1159 Type 2 diabetes mellitus with other circulatory complications: Secondary | ICD-10-CM

## 2021-03-19 DIAGNOSIS — I152 Hypertension secondary to endocrine disorders: Secondary | ICD-10-CM

## 2021-04-02 ENCOUNTER — Telehealth: Payer: Self-pay | Admitting: *Deleted

## 2021-04-02 NOTE — Telephone Encounter (Signed)
Ortho bundle 90 day call completed. 

## 2021-04-07 ENCOUNTER — Ambulatory Visit (INDEPENDENT_AMBULATORY_CARE_PROVIDER_SITE_OTHER): Payer: Medicare Other | Admitting: *Deleted

## 2021-04-07 DIAGNOSIS — M1811 Unilateral primary osteoarthritis of first carpometacarpal joint, right hand: Secondary | ICD-10-CM

## 2021-04-07 DIAGNOSIS — E1151 Type 2 diabetes mellitus with diabetic peripheral angiopathy without gangrene: Secondary | ICD-10-CM

## 2021-04-07 DIAGNOSIS — G4733 Obstructive sleep apnea (adult) (pediatric): Secondary | ICD-10-CM

## 2021-04-07 NOTE — Chronic Care Management (AMB) (Addendum)
. Chronic Care Management   CCM RN Visit Note  04/07/2021 Name: Jennifer [name] MRN: [id number] DOB: 01/03/1951  Subjective: Jennifer [name] is a 70 y.o. year old female who is a primary care patient of Jennifer Ip, DO. The care management team was consulted for assistance with disease management and care coordination needs.    Engaged with patient by telephone for follow up visit in response to provider referral for case management and/or care coordination services.   Consent to Services:  The patient was given information about Chronic Care Management services, agreed to services, and gave verbal consent prior to initiation of services.  Please see initial visit note for detailed documentation.   Patient agreed to services and verbal consent obtained.   Assessment: Review of patient past medical history, allergies, medications, health status, including review of consultants reports, laboratory and other test data, was performed as part of comprehensive evaluation and provision of chronic care management services.   SDOH (Social Determinants of Health) assessments and interventions performed:    CCM Care Plan  Allergies  Allergen Reactions   Oxycodone Nausea And Vomiting    Pt stated, "It makes me not want to eat"   Hydrocodone Itching    Outpatient Encounter Medications as of 04/07/2021  Medication Sig   acetaminophen (TYLENOL) 500 MG tablet Take 1,000 mg by mouth 2 (two) times [name] as needed for mild pain or moderate pain.    aspirin 81 MG chewable tablet Chew 1 tablet (81 mg total) by mouth 2 (two) times [name].   atenolol (TENORMIN) 25 MG tablet Take 1 tablet (25 mg total) by mouth [name].   atorvastatin (LIPITOR) 40 MG tablet Take 1 tablet (40 mg total) by mouth at bedtime.   calcium carbonate (TUMS - DOSED IN MG ELEMENTAL CALCIUM) 500 MG chewable tablet Chew 1,000 mg by mouth [name] as needed for indigestion or heartburn.   Coenzyme Q-10 100 MG capsule Take 2  capsules (200 mg total) by mouth [name]. (Patient taking differently: Take 100 mg by mouth [name].)   doxycycline (VIBRA-TABS) 100 MG tablet Take 1 tablet (100 mg total) by mouth 2 (two) times [name].   magnesium hydroxide (MILK OF MAGNESIA) 400 MG/5ML suspension Take 15 mLs by mouth [name] as needed for mild constipation.   metFORMIN (GLUCOPHAGE) 500 MG tablet Take 1 tablet (500 mg total) by mouth [name] with breakfast.   methocarbamol (ROBAXIN) 500 MG tablet TAKE 1 TABLET BY MOUTH EVERY 6 HOURS AS NEEDED FOR MUSCLE SPASM   Multiple Vitamins-Minerals (ADULT GUMMY PO) Take 1 capsule by mouth [name].   ondansetron (ZOFRAN ODT) 4 MG disintegrating tablet Take 1 tablet (4 mg total) by mouth every 8 (eight) hours as needed for nausea or vomiting.   oxyCODONE (OXY IR/ROXICODONE) 5 MG immediate release tablet Take 1 tablet (5 mg total) by mouth every 6 (six) hours as needed for moderate pain (pain score 4-6).   triamterene-hydrochlorothiazide (MAXZIDE-25) 37.5-25 MG tablet Take 1 tablet by mouth [name].   No facility-administered encounter medications on file as of 04/07/2021.    Patient Active Problem List   Diagnosis Date Noted   Unilateral primary osteoarthritis, right hip 12/17/2020   Status post total replacement of right hip 12/17/2020   Hip pain 07/24/2020   OSA on CPAP 04/04/2019   Peripheral arterial disease (HCC) 09/21/2017   Type 2 diabetes, with peripheral circulatory disorder not at goal [facility]) 08/16/2017   Chronic pain of left ankle 08/11/2017   Bilateral carpal tunnel syndrome  08/11/2017   Hypertension associated with diabetes (HCC) 08/11/2017   Morbid obesity (HCC) 08/11/2017   Localized primary osteoarthritis of left lower leg 08/11/2017   Hyperlipidemia associated with type 2 diabetes mellitus (HCC) 08/11/2017   Tobacco use disorder 08/11/2017   History of adenomatous polyp of colon 07/23/2016   S/P total knee arthroplasty 02/12/2014    Conditions to be addressed/monitored:HLD,  DMII, Osteoarthritis, and sleep apnea  Care Plan : [facility] Care Plan  Updates made by Jennifer Daily, RN since 04/07/2021 12:00 AM     Problem: Chronic Disease Management Needs   Priority: High  Onset Date: 02/04/2021     Long-Range Goal: Work with RN Care Manager Regarding Care Management and Care Coordination Associated with HLD, DM, OSA, and Arthritis   Start Date: 02/04/2021  Expected End Date: 02/04/2022  This Visit's Progress: On track  Recent Progress: On track  Priority: High  Note:   Current Barriers:  Chronic Disease Management support and education needs related to HLD, DMII, Osteoarthritis, and Sleep Apnea Limited mobility  RNCM Clinical Goal(s):  Patient will continue to work with RN Care Manager to address care management and care coordination needs related to  HLD, DMII, Osteoarthritis, and Sleep Apnea  through collaboration with RN Care manager, provider, and care team.  Patient will follow up with ortho regarding chronic knee pain  Interventions: 1:1 collaboration with primary care provider regarding development and update of comprehensive plan of care as evidenced by provider attestation and co-signature Inter-disciplinary care team collaboration (see longitudinal plan of care) Evaluation of current treatment plan related to  self management and patient's adherence to plan as established by provider   Diabetes:  (Status: Goal on Track (progressing): YES.) Lab Results  Component Value Date   HGBA1C 6.8 09/27/2020  Assessed patient's understanding of A1c goal: <7% Collaborated with [facility] front office staff to reach out to patient to schedule 6 month f/u with Dr Jennifer [name] that is due this month Reviewed medications and importance of compliance Encouraged to continue testing and recording blood sugar [name] and to call PCP with any readings outside of recommended range  Osteoarthritis:  (Status: Goal on Track (progressing): YES.) Discussed mobility and  ambulation She is not using any assistive devices to walk now.  Has chronic knee pain but balance is good Previously discussed fall prevention strategies Reviewed and discussed upcoming appointment with ortho in Feb 2023 Encouraged to reach out to ortho or PCP with any new or worsening symptoms   Hyperlipidemia:  (Status: Condition stable. Not addressed this visit.) Lab Results  Component Value Date   CHOL 173 10/04/2020   HDL 37 (L) 10/04/2020   LDLCALC 81 10/04/2020   TRIG 341 (H) 10/04/2020   CHOLHDL 4.7 (H) 10/04/2020   Medication review performed; medication list updated in electronic medical record.  Chart reviewed includuing recent office visits and most recent lab results Lipid panel was abnormal on 09/27/20 because she wasn't fasting Reminded that an order is in for a repeat lipid panel and that she should return to [facility] fasting to have this drawn Discussed current limitations on physical activity due to left knee and right hip pain Has an appointment scheduled with orthopedic surgeon to discuss hip replacement Low sodium as well as ADA/Carb modified diet recommended   Sleep Apnea:  (Status: Goal on Track (progressing): YES.) Reviewed upcoming appointment with [facility] regarding sleep apnea Discussed sleep apnea management and use of CPAP Using at least 5 nights a week for at least 4  hours each night and most of the time longer Discussed quality of sleep.  Sleeps well with CPAP Encouraged to use nightly as directed for at least 4 hours  Encouraged to properly maintain machine and supplies for optimal fit and function   Patient Goals/Self-Care Activities: Patient will self administer medications as prescribed as evidenced by self report/primary caregiver report  Patient will attend all scheduled provider appointments as evidenced by clinician review of documented attendance to scheduled appointments and patient/caregiver report Patient will continue to perform ADL's  independently as evidenced by patient/caregiver report Patient will continue to perform IADL's independently as evidenced by patient/caregiver report Patient will call provider office for new concerns or questions as evidenced by review of documented incoming telephone call notes and patient report Patient will call RN Care Manager as needed [phone] Talk with Ortho about continued knee pain Patient will follow an ADA/Carb modified diet and will limit or eliminate intake of fried/greasy foods Schedule a 6 month follow-up with Dr Jennifer [name]  Plan:Telephone follow up appointment with care management team member scheduled for:  06/09/21 with [facility] The patient has been provided with contact information for the care management team and has been advised to call with any health related questions or concerns.   Demetrios Loll, BSN, RN-BC Embedded Chronic Care Manager [facility] Care Management Direct Dial: [phone]         I have reviewed and agree with the above  documentation.   Jannifer Rodney, FNP

## 2021-04-07 NOTE — Patient Instructions (Signed)
Visit Information  Patient Goals/Self-Care Activities: Patient will self administer medications as prescribed as evidenced by self report/primary caregiver report  Patient will attend all scheduled provider appointments as evidenced by clinician review of documented attendance to scheduled appointments and patient/caregiver report Patient will continue to perform ADL's independently as evidenced by patient/caregiver report Patient will continue to perform IADL's independently as evidenced by patient/caregiver report Patient will call provider office for new concerns or questions as evidenced by review of documented incoming telephone call notes and patient report Patient will call RN Care Manager as needed [phone] Talk with Ortho about continued knee pain Patient will follow an ADA/Carb modified diet and will limit or eliminate intake of fried/greasy foods Schedule a 6 month follow-up with Dr Nadine Counts  Patient verbalizes understanding of instructions provided today and agrees to view in MyChart.   Plan:Telephone follow up appointment with care management team member scheduled for:  06/09/21 with RNCM The patient has been provided with contact information for the care management team and has been advised to call with any health related questions or concerns.   Demetrios Loll, BSN, RN-BC Embedded Chronic Care Manager [facility] Care Management Direct Dial: [phone]

## 2021-04-18 ENCOUNTER — Other Ambulatory Visit: Payer: Self-pay | Admitting: Family Medicine

## 2021-04-18 DIAGNOSIS — M1990 Unspecified osteoarthritis, unspecified site: Secondary | ICD-10-CM

## 2021-04-19 DIAGNOSIS — M1811 Unilateral primary osteoarthritis of first carpometacarpal joint, right hand: Secondary | ICD-10-CM

## 2021-04-19 DIAGNOSIS — E1151 Type 2 diabetes mellitus with diabetic peripheral angiopathy without gangrene: Secondary | ICD-10-CM | POA: Diagnosis not present

## 2021-04-30 ENCOUNTER — Ambulatory Visit (INDEPENDENT_AMBULATORY_CARE_PROVIDER_SITE_OTHER): Payer: Medicare Other | Admitting: Licensed Clinical Social Worker

## 2021-04-30 DIAGNOSIS — G4733 Obstructive sleep apnea (adult) (pediatric): Secondary | ICD-10-CM

## 2021-04-30 DIAGNOSIS — I739 Peripheral vascular disease, unspecified: Secondary | ICD-10-CM

## 2021-04-30 DIAGNOSIS — M1712 Unilateral primary osteoarthritis, left knee: Secondary | ICD-10-CM

## 2021-04-30 DIAGNOSIS — E1169 Type 2 diabetes mellitus with other specified complication: Secondary | ICD-10-CM

## 2021-04-30 DIAGNOSIS — I152 Hypertension secondary to endocrine disorders: Secondary | ICD-10-CM

## 2021-04-30 DIAGNOSIS — E1159 Type 2 diabetes mellitus with other circulatory complications: Secondary | ICD-10-CM

## 2021-04-30 DIAGNOSIS — E1151 Type 2 diabetes mellitus with diabetic peripheral angiopathy without gangrene: Secondary | ICD-10-CM

## 2021-04-30 DIAGNOSIS — Z96651 Presence of right artificial knee joint: Secondary | ICD-10-CM

## 2021-04-30 NOTE — Chronic Care Management (AMB) (Signed)
Chronic Care Management    Clinical Social Work Note  04/30/2021 Name: Jennifer [name] MRN: [id number] DOB: June 21, 1950  Jennifer [name] is a 71 y.o. year old female who is a primary care patient of Janora Norlander, DO. The CCM team was consulted to assist the patient with chronic disease management and/or care coordination needs related to: [employer] .   Engaged with patient by telephone for follow up visit in response to provider referral for social work chronic care management and care coordination services.   Consent to Services:  The patient was given information about Chronic Care Management services, agreed to services, and gave verbal consent prior to initiation of services.  Please see initial visit note for detailed documentation.   Patient agreed to services and consent obtained.   Assessment: Review of patient past medical history, allergies, medications, and health status, including review of relevant consultants reports was performed today as part of a comprehensive evaluation and provision of chronic care management and care coordination services.     SDOH (Social Determinants of Health) assessments and interventions performed:  SDOH Interventions    Flowsheet Row Most Recent Value  SDOH Interventions   Physical Activity Interventions Other (Comments)  [walking challenges.she has a cane and a walker to uses as needed to help her walk]  Stress Interventions Provide Counseling  [client has stress related to managing her health needs. she has stress related to the health needs of her spouse]  Depression Interventions/Treatment  --  [informed client of LCSW support and of RNCM support]        Advanced Directives Status: See Vynca application for related entries.  CCM Care Plan  Allergies  Allergen Reactions   Oxycodone Nausea And Vomiting    Pt stated, "It makes me not want to eat"   Hydrocodone Itching    Outpatient Encounter Medications as of  04/30/2021  Medication Sig   acetaminophen (TYLENOL) 500 MG tablet Take 1,000 mg by mouth 2 (two) times daily as needed for mild pain or moderate pain.    aspirin 81 MG chewable tablet Chew 1 tablet (81 mg total) by mouth 2 (two) times daily.   atenolol (TENORMIN) 25 MG tablet Take 1 tablet (25 mg total) by mouth daily.   atorvastatin (LIPITOR) 40 MG tablet Take 1 tablet (40 mg total) by mouth at bedtime.   calcium carbonate (TUMS - DOSED IN MG ELEMENTAL CALCIUM) 500 MG chewable tablet Chew 1,000 mg by mouth daily as needed for indigestion or heartburn.   Coenzyme Q-10 100 MG capsule Take 2 capsules (200 mg total) by mouth daily. (Patient taking differently: Take 100 mg by mouth daily.)   doxycycline (VIBRA-TABS) 100 MG tablet Take 1 tablet (100 mg total) by mouth 2 (two) times daily.   magnesium hydroxide (MILK OF MAGNESIA) 400 MG/5ML suspension Take 15 mLs by mouth daily as needed for mild constipation.   metFORMIN (GLUCOPHAGE) 500 MG tablet Take 1 tablet (500 mg total) by mouth daily with breakfast.   methocarbamol (ROBAXIN) 500 MG tablet TAKE 1 TABLET BY MOUTH EVERY 6 HOURS AS NEEDED FOR MUSCLE SPASM   Multiple Vitamins-Minerals (ADULT GUMMY PO) Take 1 capsule by mouth daily.   ondansetron (ZOFRAN ODT) 4 MG disintegrating tablet Take 1 tablet (4 mg total) by mouth every 8 (eight) hours as needed for nausea or vomiting.   oxyCODONE (OXY IR/ROXICODONE) 5 MG immediate release tablet Take 1 tablet (5 mg total) by mouth every 6 (six) hours as needed for moderate  pain (pain score 4-6).   triamterene-hydrochlorothiazide (MAXZIDE-25) 37.5-25 MG tablet Take 1 tablet by mouth daily.   No facility-administered encounter medications on file as of 04/30/2021.    Patient Active Problem List   Diagnosis Date Noted   Unilateral primary osteoarthritis, right hip 12/17/2020   Status post total replacement of right hip 12/17/2020   Hip pain 07/24/2020   OSA on CPAP 04/04/2019   Peripheral arterial disease  ([place]) 09/21/2017   Type 2 diabetes, with peripheral circulatory disorder not at goal [facility]) 08/16/2017   Chronic pain of left ankle 08/11/2017   Bilateral carpal tunnel syndrome 08/11/2017   Hypertension associated with diabetes ([place]) 08/11/2017   Morbid obesity ([place]) 08/11/2017   Localized primary osteoarthritis of left lower leg 08/11/2017   Hyperlipidemia associated with type 2 diabetes mellitus ([place]) 08/11/2017   Tobacco use disorder 08/11/2017   History of adenomatous polyp of colon 07/23/2016   S/P total knee arthroplasty 02/12/2014    Conditions to be addressed/monitored: monitor client completion of ADLs. Monitor client management of pain issues  Care Plan : LCSW care plan  Updates made by Katha Cabal, LCSW since 04/30/2021 12:00 AM     Problem: Coping Skills (General Plan of Care)      Goal: Learn more about community resources to help client with mobility improvemet and help client with completion of daily ADLs   Start Date: 04/30/2021  Expected End Date: 07/25/2021  This Visit's Progress: On track  Recent Progress: On track  Priority: Medium  Note:   Current barriers:   Patient in need of assistance with connecting to community resources for help as needed with mobility improvement of client and to help client complete daily ADLs Patient is unable to independently navigate community resource options without care coordination support Mobility challenges Pain issues Ambulation challenges; risk for falls.   Clinical Goals:  patient will work with SW in next 30 days  to address concerns related to mobility challenges of client  Patient will work with SW in next 30 days to address concerns related to ADLs completion of client on daily basis  Patient will communicate with RNCM or LCSW as needed for CCM support in next 30 days  Clinical Interventions:  Collaboration with Janora Norlander, DO regarding development and update of comprehensive plan of care as evidenced  by provider attestation and co-signature Discussed mobility of client (has a cane and walker to use as needed) Discussed pain issues of client. She spoke of knee pain issue Reviewed family support  for client (spouse is supportive, daughter is supportive,son is supportive) Encouraged client to call RNCM as needed for nursing support for client Reviewed sleeping issues of client. She said she is using C-PAP to help her sleep. She said she is sleeping better Reviewed transport needs. She said she drives herself to local appointments or to do errands. Her family also helps her with transport as needed. Provided counseling support for client Discussed health needs of her spouse. She said she is concerned over health needs of her spouse.  She said her spouse has lost weight and medical providers are not sure of what is causing his current health issues. She said her spouse has had tests related to his esophagus. She said he may need hernia surgery., Reviewed medication procurement of client Discussed fall issues. She said she has not had any recent falls Discussed headaches of client. She said she has been having headaches recently. She said she is concerned over health of  her spouse and this may be causing stress for her  Patient Strengths: Takes medications as prescribed Attends scheduled medical appointments Self advocates as needed Has family support  Patient Deficits Some Pain issues Mobility challenges Challenges in doing ADLs  Patient Goals:  Attend scheduled medical appointments Take time for rest and relaxation and enjoyment of hobbies Advocate for self and for understanding of her medical conditions Talk with RNCM to address nursing needs Talk with LCSW about community resources of possible benefit to client -  Follow Up Plan: LCSW to call client on 06/20/21 at 1:00 PM to assess client needs       Norva Riffle.Hanh Kertesz MSW, LCSW Licensed Clinical Social Worker [facility] Care  Management [phone]

## 2021-04-30 NOTE — Patient Instructions (Addendum)
Visit Information  Patient Goals:  Find Help in My Community (Patient). Patient needs information about community resources to help her with mobility challenges, pain management, and to help her with completion of daily ADLs  Timeframe:  Short-Term Goal Priority:  Medium Progress: On Track Start Date:   04/30/21                          Expected End Date:  07/25/21              Follow Up Date : 06/20/21 at 1:00 PM   Find Help in My Community (Patient) Patient needs information about community resources to help her with mobility challenges, pain management, and to help her with completion of daily ADLs  Why is this important?   Knowing how and where to find help for yourself or family in your neighborhood and community is an important skill.  You will want to take some steps to learn how.    Patient Strengths: Takes medications as prescribed Attends scheduled medical appointments Self advocates as needed Has family support  Patient Deficits Some Pain issues Mobility challenges Challenges in doing ADLs  Patient Goals:  Attend scheduled medical appointments Take time for rest and relaxation and enjoyment of hobbies Advocate for self and for understanding of her medical conditions Talk with RNCM to address nursing needs Talk with LCSW about community resources of possible benefit to client -  Follow Up Plan: LCSW to call client on 06/20/21 at 1:00 PM to assess client needs    [name].Darreld Hoffer MSW, LCSW Licensed Clinical Social Worker [facility] Care Management [phone]

## 2021-05-20 ENCOUNTER — Ambulatory Visit (INDEPENDENT_AMBULATORY_CARE_PROVIDER_SITE_OTHER): Payer: Medicare Other | Admitting: Family Medicine

## 2021-05-20 ENCOUNTER — Encounter: Payer: Self-pay | Admitting: Family Medicine

## 2021-05-20 VITALS — BP 132/85 | HR 73 | Temp 97.4°F | Ht 65.0 in | Wt 223.4 lb

## 2021-05-20 DIAGNOSIS — E1159 Type 2 diabetes mellitus with other circulatory complications: Secondary | ICD-10-CM | POA: Diagnosis not present

## 2021-05-20 DIAGNOSIS — L600 Ingrowing nail: Secondary | ICD-10-CM

## 2021-05-20 DIAGNOSIS — I152 Hypertension secondary to endocrine disorders: Secondary | ICD-10-CM

## 2021-05-20 DIAGNOSIS — E1151 Type 2 diabetes mellitus with diabetic peripheral angiopathy without gangrene: Secondary | ICD-10-CM

## 2021-05-20 DIAGNOSIS — Z23 Encounter for immunization: Secondary | ICD-10-CM

## 2021-05-20 DIAGNOSIS — M1611 Unilateral primary osteoarthritis, right hip: Secondary | ICD-10-CM

## 2021-05-20 DIAGNOSIS — E1169 Type 2 diabetes mellitus with other specified complication: Secondary | ICD-10-CM

## 2021-05-20 DIAGNOSIS — E785 Hyperlipidemia, unspecified: Secondary | ICD-10-CM

## 2021-05-20 LAB — BAYER DCA HB A1C WAIVED: HB A1C (BAYER DCA - WAIVED): 6.2 % — ABNORMAL HIGH (ref 4.8–5.6)

## 2021-05-20 MED ORDER — IBUPROFEN 600 MG PO TABS: 600.0000 mg | ORAL_TABLET | Freq: Three times a day (TID) | ORAL | 1 refills | Status: DC | PRN

## 2021-05-20 MED ORDER — ATENOLOL 25 MG PO TABS: 25.0000 mg | ORAL_TABLET | Freq: Every day | ORAL | 3 refills | Status: DC

## 2021-05-20 MED ORDER — METFORMIN HCL 500 MG PO TABS: 500.0000 mg | ORAL_TABLET | Freq: Every day | ORAL | 3 refills | Status: DC

## 2021-05-20 MED ORDER — TRIAMTERENE-HCTZ 37.5-25 MG PO TABS: 1.0000 | ORAL_TABLET | Freq: Every day | ORAL | 3 refills | Status: DC

## 2021-05-20 MED ORDER — ATORVASTATIN CALCIUM 40 MG PO TABS: 40.0000 mg | ORAL_TABLET | Freq: Every day | ORAL | 3 refills | Status: DC

## 2021-05-20 NOTE — Progress Notes (Signed)
Subjective: CC: DM PCP: Raliegh IpGottschalk, Zimal Weisensel M, DO WUJ:WJXBJHPI:Deserie W Normand SloopDillard is a 71 y.o. female presenting to clinic today for:  1. Type 2 Diabetes with hypertension, hyperlipidemia:  Patient reports compliance with her medications.  She has been doing fairly good.  Denies any concerns today.  Last eye exam: Up-to-date.  Will be due this month. Last foot exam: Up-to-date until June 2023 Last A1c:  Lab Results  Component Value Date   HGBA1C 6.2 (H) 05/20/2021   Nephropathy screen indicated?:  Needs Last flu, zoster and/or pneumovax:  Immunization History  Administered Date(s) Administered   Fluad Quad(high Dose 65+) 02/07/2019, 03/08/2020, 05/20/2021   Influenza-Unspecified 01/13/2018   Moderna Sars-Covid-2 Vaccination 05/25/2019, 06/22/2019, 03/13/2020   PNEUMOCOCCAL CONJUGATE-20 05/20/2021   Pneumococcal Conjugate-13 12/06/2018    ROS: Denies dizziness, LOC, polyuria, polydipsia, unintended weight loss/gain, foot ulcerations, numbness or tingling in extremities, shortness of breath or chest pain.  She does however report an ingrown toenail on the left great toe.  She has a history of surgery on that left foot and was told by her previous podiatrist in [place] that she would never have issues with an ingrown toenail on the left toe again.  She notes discoloration of that toe and some pain at the tip of it.  Denies any bruising, bleeding or swelling.    ROS: Per HPI  Allergies  Allergen Reactions   Oxycodone Nausea And Vomiting    Pt stated, "It makes me not want to eat"   Hydrocodone Itching   Past Medical History:  Diagnosis Date   Arthritis    Dyspnea    Hyperlipidemia    Hypertension    Pre-diabetes    Sleep apnea     Current Outpatient Medications:    acetaminophen (TYLENOL) 500 MG tablet, Take 1,000 mg by mouth 2 (two) times daily as needed for mild pain or moderate pain. , Disp: , Rfl:    calcium carbonate (TUMS - DOSED IN MG ELEMENTAL CALCIUM) 500 MG chewable  tablet, Chew 1,000 mg by mouth daily as needed for indigestion or heartburn., Disp: , Rfl:    Coenzyme Q-10 100 MG capsule, Take 2 capsules (200 mg total) by mouth daily. (Patient taking differently: Take 100 mg by mouth daily.), Disp: 60 capsule, Rfl: 6   ibuprofen (ADVIL) 600 MG tablet, Take 1 tablet (600 mg total) by mouth every 8 (eight) hours as needed for moderate pain (use sparingly)., Disp: 270 each, Rfl: 1   magnesium hydroxide (MILK OF MAGNESIA) 400 MG/5ML suspension, Take 15 mLs by mouth daily as needed for mild constipation., Disp: , Rfl:    Multiple Vitamins-Minerals (ADULT GUMMY PO), Take 1 capsule by mouth daily., Disp: , Rfl:    atenolol (TENORMIN) 25 MG tablet, Take 1 tablet (25 mg total) by mouth daily., Disp: 90 tablet, Rfl: 3   atorvastatin (LIPITOR) 40 MG tablet, Take 1 tablet (40 mg total) by mouth at bedtime., Disp: 90 tablet, Rfl: 3   metFORMIN (GLUCOPHAGE) 500 MG tablet, Take 1 tablet (500 mg total) by mouth daily with breakfast., Disp: 90 tablet, Rfl: 3   triamterene-hydrochlorothiazide (MAXZIDE-25) 37.5-25 MG tablet, Take 1 tablet by mouth daily., Disp: 90 tablet, Rfl: 3 Social History   Socioeconomic History   Marital status: Married    Spouse name: Not on file   Number of children: 3   Years of education: 12   Highest education level: Not on file  Occupational History   Occupation: CNa    Employer: [employer]  Tobacco Use   Smoking status: Every Day    Packs/day: 0.50    Years: 35.00    Pack years: 17.50    Types: Cigarettes   Smokeless tobacco: Never  Vaping Use   Vaping Use: Never used  Substance and Sexual Activity   Alcohol use: No   Drug use: No   Sexual activity: Yes  Other Topics Concern   Not on file  Social History Narrative   She lives with her husband   Social Determinants of Health   Financial Resource Strain: Low Risk    Difficulty of Paying Living Expenses: Not hard at all  Food Insecurity: No Food Insecurity   Worried About  Programme researcher, broadcasting/film/video in the Last Year: Never true   Barista in the Last Year: Never true  Transportation Needs: No Transportation Needs   Lack of Transportation (Medical): No   Lack of Transportation (Non-Medical): No  Physical Activity: Inactive   Days of Exercise per Week: 0 days   Minutes of Exercise per Session: 0 min  Stress: Stress Concern Present   Feeling of Stress : To some extent  Social Connections: Press photographer of Communication with Friends and Family: More than three times a week   Frequency of Social Gatherings with Friends and Family: More than three times a week   Attends Religious Services: More than 4 times per year   Active Member of [employer] or Organizations: Yes   Attends Engineer, structural: More than 4 times per year   Marital Status: Married  Catering manager Violence: Not At Risk   Fear of Current or Ex-Partner: No   Emotionally Abused: No   Physically Abused: No   Sexually Abused: No   Family History  Problem Relation Age of Onset   Diabetes Mother    Stroke Mother    Alcohol abuse Father    Stroke Father    Colon cancer Neg Hx     Objective: Office vital signs reviewed. BP 132/85    Pulse 73    Temp (!) 97.4 F (36.3 C)    Ht 5\' 5"  (1.651 m)    Wt 223 lb 6.4 oz (101.3 kg)    SpO2 97%    BMI 37.18 kg/m   Physical Examination:  General: Awake, alert, well-appearing, obese female. No acute distress HEENT: Sclera white.  Moist mucous membranes Cardio: regular rate and rhythm, S1S2 heard, no murmurs appreciated Pulm: clear to auscultation bilaterally, no wheezes, rhonchi or rales; normal work of breathing on room air MSK: Left great toe with appreciable curling of her nail into the nail bed.  No induration, erythema or drainage appreciated.  She does appear to have slight lifting of the toenail from the nail bed   Assessment/ Plan: 71 y.o. 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 (HCC)  - Plan: Bayer DCA Hb A1c Waived, Microalbumin / creatinine urine ratio, Pneumococcal conjugate vaccine 20-valent (Prevnar 20), Flu Vaccine QUAD High Dose(Fluad), metFORMIN (GLUCOPHAGE) 500 MG tablet  Hyperlipidemia associated with type 2 diabetes mellitus (HCC) - Plan: atorvastatin (LIPITOR) 40 MG tablet  Hypertension associated with diabetes (HCC) - Plan: triamterene-hydrochlorothiazide (MAXZIDE-25) 37.5-25 MG tablet, atenolol (TENORMIN) 25 MG tablet  Primary osteoarthritis of right hip - Plan: ibuprofen (ADVIL) 600 MG tablet  Ingrown toenail of left foot - Plan: Ambulatory referral to Podiatry  Sugar remains under excellent control.  Pneumococcal and influenza vaccinations administered today.  Urine microalbumin ordered.  Continue statin for hyperlipidemia.  Not yet due for fasting lipid  Blood pressure remains under excellent control.  Continue current regimen  OA of the hip is stable.  Advil renewed  Referral to podiatry locally placed for this ingrown toenail the left toe.  Likely no evidence of infection at this time.  I wonder if the nail lifting from the nail bed is what is causing her pain  Orders Placed This Encounter  Procedures   Pneumococcal conjugate vaccine 20-valent (Prevnar 20)   Flu Vaccine QUAD High Dose(Fluad)   Bayer DCA Hb A1c Waived   Microalbumin / creatinine urine ratio   Ambulatory referral to Podiatry    Referral Priority:   Routine    Referral Type:   Consultation    Referral Reason:   Specialty Services Required    Requested Specialty:   Podiatry    Number of Visits Requested:   1   Meds ordered this encounter  Medications   ibuprofen (ADVIL) 600 MG tablet    Sig: Take 1 tablet (600 mg total) by mouth every 8 (eight) hours as needed for moderate pain (use sparingly).    Dispense:  270 each    Refill:  1   metFORMIN (GLUCOPHAGE) 500 MG tablet    Sig: Take 1 tablet (500 mg total) by mouth daily with breakfast.    Dispense:  90 tablet    Refill:  3    triamterene-hydrochlorothiazide (MAXZIDE-25) 37.5-25 MG tablet    Sig: Take 1 tablet by mouth daily.    Dispense:  90 tablet    Refill:  3   atorvastatin (LIPITOR) 40 MG tablet    Sig: Take 1 tablet (40 mg total) by mouth at bedtime.    Dispense:  90 tablet    Refill:  3   atenolol (TENORMIN) 25 MG tablet    Sig: Take 1 tablet (25 mg total) by mouth daily.    Dispense:  90 tablet    Refill:  3     Virgia Kelner Hulen Skains, DO [facility] [phone]

## 2021-05-22 ENCOUNTER — Ambulatory Visit: Payer: Medicare Other | Admitting: Adult Health

## 2021-05-29 ENCOUNTER — Ambulatory Visit: Payer: Medicare Other | Admitting: Physician Assistant

## 2021-06-09 ENCOUNTER — Telehealth: Payer: Medicare Other

## 2021-06-13 ENCOUNTER — Telehealth: Payer: Medicare Other | Admitting: *Deleted

## 2021-06-20 ENCOUNTER — Telehealth: Payer: Medicare Other

## 2021-06-26 ENCOUNTER — Telehealth: Payer: Medicare Other

## 2021-06-26 ENCOUNTER — Telehealth: Payer: Self-pay | Admitting: Licensed Clinical Social Worker

## 2021-06-26 NOTE — Telephone Encounter (Signed)
?  Chronic Care Management  ?  ? Clinical Social Work Note ?  ?06/26/21 ?Name: Jennifer [name]           MRN: [id number]       DOB: Jan 31, 1951 ?  ?[name] is a 72 y.o. year old female who is a primary care patient of Raliegh Ip, DO. The CCM team was consulted to assist the patient with chronic disease management and/or care coordination needs related to: [facility] .  ?  ?LCSW not able to speak via phone with client today. LCSW not able to leave phone message for client today ? ?Follow Up Plan:  Call client on 08/20/20 at 11:00 AM ? ?[name].Kester Stimpson MSW, LCSW ?Licensed Clinical Social Worker ?[facility] Care Management ?[phone]  ?

## 2021-07-29 ENCOUNTER — Telehealth: Payer: Self-pay

## 2021-07-29 ENCOUNTER — Ambulatory Visit (INDEPENDENT_AMBULATORY_CARE_PROVIDER_SITE_OTHER): Payer: Medicare Other

## 2021-07-29 VITALS — Wt 223.0 lb

## 2021-07-29 DIAGNOSIS — Z780 Asymptomatic menopausal state: Secondary | ICD-10-CM

## 2021-07-29 DIAGNOSIS — Z0000 Encounter for general adult medical examination without abnormal findings: Secondary | ICD-10-CM

## 2021-07-29 DIAGNOSIS — F331 Major depressive disorder, recurrent, moderate: Secondary | ICD-10-CM | POA: Insufficient documentation

## 2021-07-29 NOTE — Progress Notes (Signed)
? ?Subjective:  ? Jennifer [name] is a 71 y.o. female who presents for Medicare Annual (Subsequent) preventive examination. ? ?Virtual Visit via Telephone Note ? ?I connected with  [name] on 07/29/21 at  3:30 PM EDT by telephone and verified that I am speaking with the correct person using two identifiers. ? ?Location: ?Patient: Home ?Provider: [facility] ?Persons participating in the virtual visit: patient/Nurse Health Advisor ?  ?I discussed the limitations, risks, security and privacy concerns of performing an evaluation and management service by telephone and the availability of in person appointments. The patient expressed understanding and agreed to proceed. ? ?Interactive audio and video telecommunications were attempted between this nurse and patient, however failed, due to patient having technical difficulties OR patient did not have access to video capability.  We continued and completed visit with audio only. ? ?Some vital signs may be absent or patient reported.  ? ?Nyja Westbrook Dionne Ano, LPN  ? ?Review of Systems    ? ?Cardiac Risk Factors include: advanced age (>2men, >31 women);diabetes mellitus;dyslipidemia;hypertension;sedentary lifestyle;obesity (BMI >30kg/m2);smoking/ tobacco exposure;Other (see comment), Risk factor comments: OSA on CPAP, peripheral arterial disease ? ?   ?Objective:  ?  ?Today's Vitals  ? 07/29/21 1534 07/29/21 1537  ?Weight: 223 lb (101.2 kg)   ?PainSc:  6   ? ?Body mass index is 37.11 kg/m?. ? ? ?  07/29/2021  ?  3:53 PM 12/17/2020  ? 11:46 PM 12/06/2020  ?  2:38 PM 07/26/2020  ?  2:44 PM 04/06/2019  ?  9:03 AM 11/25/2016  ?  7:29 AM 02/12/2014  ? 12:00 PM  ?Advanced Directives  ?Does Patient Have a Medical Advance Directive? No No No No No No No  ?Would patient like information on creating a medical advance directive? No - Patient declined No - Patient declined No - Patient declined Yes (MAU/Ambulatory/Procedural Areas - Information given) Yes (MAU/Ambulatory/Procedural Areas -  Information given) No - Patient declined No - patient declined information  ? ? ?Current Medications (verified) ?Outpatient Encounter Medications as of 07/29/2021  ?Medication Sig  ? acetaminophen (TYLENOL) 500 MG tablet Take 1,000 mg by mouth 2 (two) times daily as needed for mild pain or moderate pain.   ? atenolol (TENORMIN) 25 MG tablet Take 1 tablet (25 mg total) by mouth daily.  ? atorvastatin (LIPITOR) 40 MG tablet Take 1 tablet (40 mg total) by mouth at bedtime.  ? calcium carbonate (TUMS - DOSED IN MG ELEMENTAL CALCIUM) 500 MG chewable tablet Chew 1,000 mg by mouth daily as needed for indigestion or heartburn.  ? Coenzyme Q-10 100 MG capsule Take 2 capsules (200 mg total) by mouth daily. (Patient taking differently: Take 100 mg by mouth daily.)  ? ibuprofen (ADVIL) 600 MG tablet Take 1 tablet (600 mg total) by mouth every 8 (eight) hours as needed for moderate pain (use sparingly).  ? magnesium hydroxide (MILK OF MAGNESIA) 400 MG/5ML suspension Take 15 mLs by mouth daily as needed for mild constipation.  ? metFORMIN (GLUCOPHAGE) 500 MG tablet Take 1 tablet (500 mg total) by mouth daily with breakfast.  ? triamterene-hydrochlorothiazide (MAXZIDE-25) 37.5-25 MG tablet Take 1 tablet by mouth daily.  ? Multiple Vitamins-Minerals (ADULT GUMMY PO) Take 1 capsule by mouth daily. (Patient not taking: Reported on 07/29/2021)  ? ?No facility-administered encounter medications on file as of 07/29/2021.  ? ? ?Allergies (verified) ?Oxycodone and Hydrocodone  ? ?History: ?Past Medical History:  ?Diagnosis Date  ? Arthritis   ? Dyspnea   ? Hyperlipidemia   ?  Hypertension   ? Pre-diabetes   ? Sleep apnea   ? ?Past Surgical History:  ?Procedure Laterality Date  ? ABDOMINAL HYSTERECTOMY    ? COLONOSCOPY N/A 11/25/2016  ? Procedure: COLONOSCOPY;  Surgeon: Rogene Houston, MD;  [facility];  Service: Endoscopy;  Laterality: N/A;  830  ? EYE SURGERY    ? FOOT SURGERY Left 26-Aug-2016  ? reconstructed tendon  ? KNEE  ARTHROSCOPY    ? BIL   08-26-2008   ? LUNG BIOPSY    ? RIGHT 30 YRS AGO   ? 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 Procedure: RIGHT TOTAL HIP ARTHROPLASTY ANTERIOR APPROACH;  Surgeon: Mcarthur Rossetti, MD;  Location: [facility];  Service: Orthopedics;  Laterality: Right;  ? TOTAL KNEE ARTHROPLASTY Right 02/12/2014  ? Procedure: TOTAL KNEE ARTHROPLASTY;  Surgeon: Vickey Huger, MD;  Location: [facility];  Service: Orthopedics;  Laterality: Right;  ? ?Family History  ?Problem Relation Age of Onset  ? Diabetes Mother   ? Stroke Mother   ? Alcohol abuse Father   ? Stroke Father   ? Colon cancer Neg Hx   ? ?Social History  ? ?Socioeconomic History  ? Marital status: Married  ?  Spouse name: [name]  ? Number of children: 3  ? Years of education: 42  ? Highest education level: Not on file  ?Occupational History  ? Occupation: CNa  ?  Employer: [employer]  ?Tobacco Use  ? Smoking status: Every Day  ?  Packs/day: 0.50  ?  Years: 35.00  ?  Pack years: 17.50  ?  Types: Cigarettes  ? Smokeless tobacco: Never  ?Vaping Use  ? Vaping Use: Never used  ?Substance and Sexual Activity  ? Alcohol use: No  ? Drug use: No  ? Sexual activity: Yes  ?Other Topics Concern  ? Not on file  ?Social History Narrative  ? She lives with her husband  ? One son passed away from sepsis in Aug 27, 2019  ? One son lives with her now  ? ?Social Determinants of Health  ? ?Financial Resource Strain: Low Risk   ? Difficulty of Paying Living Expenses: Not hard at all  ?Food Insecurity: No Food Insecurity  ? Worried About Charity fundraiser in the Last Year: Never true  ? Ran Out of Food in the Last Year: Never true  ?Transportation Needs: No Transportation Needs  ? Lack of Transportation (Medical): No  ? Lack of Transportation (Non-Medical): No  ?Physical Activity: Inactive  ? Days of Exercise per Week: 0 days  ? Minutes of Exercise per Session: 0 min  ?Stress: No Stress Concern Present  ? Feeling of Stress : Only a little  ?Social Connections: Socially Integrated  ?  Frequency of Communication with Friends and Family: More than three times a week  ? Frequency of Social Gatherings with Friends and Family: More than three times a week  ? Attends Religious Services: More than 4 times per year  ? Active Member of Clubs or Organizations: Yes  ? Attends Archivist Meetings: More than 4 times per year  ? Marital Status: Married  ? ? ?Tobacco Counseling ?Ready to quit: Not Answered ?Counseling given: Not Answered ? ? ?Clinical Intake: ? ?Pre-visit preparation completed: Yes ? ?Pain : 0-10 ?Pain Score: 6  ?Pain Type: Chronic pain ?Pain Location: Hip ?Pain Orientation: Right ?Pain Descriptors / Indicators: Aching, Sore ?Pain Onset: More than a month ago ?Pain Frequency: Intermittent ? ?  ? ?BMI -  recorded: 37.11 ?Nutritional Status: BMI > 30  Obese ?Nutritional Risks: None ?Diabetes: Yes ?CBG done?: No ?Did pt. bring in CBG monitor from home?: No ? ?How often do you need to have someone help you when you read instructions, pamphlets, or other written materials from your doctor or pharmacy?: 1 - Never ? ?Diabetic? Nutrition Risk Assessment: ? ?Has the patient had any N/V/D within the last 2 months?  No  ?Does the patient have any non-healing wounds?   She has a place on her great toe that hurts intermittently - is waiting for podiatry referral ?Has the patient had any unintentional weight loss or weight gain?  No  ? ?Diabetes: ? ?Is the patient diabetic?  Yes  ?If diabetic, was a CBG obtained today?  No  ?Did the patient bring in their glucometer from home?  No  ?How often do you monitor your CBG's? never.  ? ?Financial Strains and Diabetes Management: ? ?Are you having any financial strains with the device, your supplies or your medication? No .  ?Does the patient want to be seen by Chronic Care Management for management of their diabetes?  No  ?Would the patient like to be referred to a Nutritionist or for Diabetic Management?  No  ? ?Diabetic Exams: ? ?Diabetic Eye Exam:  Completed 06/10/2020. Overdue for diabetic eye exam. Pt has been advised about the importance in completing this exam. She has appt coming up ? ?Diabetic Foot Exam: Completed 09/27/2020. Pt has been advised about the importance

## 2021-07-29 NOTE — Patient Instructions (Signed)
Jennifer [name] , ?Thank you for taking time to come for your Medicare Wellness Visit. I appreciate your ongoing commitment to your health goals. Please review the following plan we discussed and let me know if I can assist you in the future.  ? ?Screening recommendations/referrals: ?Colonoscopy: Done 11/25/2016 - repeat in 7 years ?Mammogram: Done 10/04/2020 - Repeat annually  ?Bone Density: Due - recommended every 2 years for women over age 69 - we can do this in the office at your next visit ?Recommended yearly ophthalmology/optometry visit for glaucoma screening and checkup ?Recommended yearly dental visit for hygiene and checkup ? ?Vaccinations: ?Influenza vaccine: Done 05/20/2021 - Repeat annually in fall ?Pneumococcal vaccine: Done  12/06/2018 & 05/20/2021 ?Tdap vaccine: Due - recommended every 10 years ?Shingles vaccine: Due - Shingrix is 2 doses 2-6 months apart and over 90% effective     ?Covid-19: Done 05/25/2019, 06/22/2019, 03/13/2020, 11/20/2020 & 06/26/2021  ? ?Advanced directives: Advance directive discussed with you today. Even though you declined this today, please call our office should you change your mind, and we can give you the proper paperwork for you to fill out.  ? ?Conditions/risks identified: Aim for 30 minutes of exercise or brisk walking, 6-8 glasses of water, and 5 servings of fruits and vegetables each day.  ? ?Next appointment: Follow up in one year for your annual wellness visit  ? ? ?Preventive Care 41 Years and Older, Female ?Preventive care refers to lifestyle choices and visits with your health care provider that can promote health and wellness. ?What does preventive care include? ?A yearly physical exam. This is also called an annual well check. ?Dental exams once or twice a year. ?Routine eye exams. Ask your health care provider how often you should have your eyes checked. ?Personal lifestyle choices, including: ?Daily care of your teeth and gums. ?Regular physical activity. ?Eating a healthy  diet. ?Avoiding tobacco and drug use. ?Limiting alcohol use. ?Practicing safe sex. ?Taking low-dose aspirin every day. ?Taking vitamin and mineral supplements as recommended by your health care provider. ?What happens during an annual well check? ?The services and screenings done by your health care provider during your annual well check will depend on your age, overall health, lifestyle risk factors, and family history of disease. ?Counseling  ?Your health care provider may ask you questions about your: ?Alcohol use. ?Tobacco use. ?Drug use. ?Emotional well-being. ?Home and relationship well-being. ?Sexual activity. ?Eating habits. ?History of falls. ?Memory and ability to understand (cognition). ?Work and work Astronomer. ?Reproductive health. ?Screening  ?You may have the following tests or measurements: ?Height, weight, and BMI. ?Blood pressure. ?Lipid and cholesterol levels. These may be checked every 5 years, or more frequently if you are over 69 years old. ?Skin check. ?Lung cancer screening. You may have this screening every year starting at age 38 if you have a 30-pack-year history of smoking and currently smoke or have quit within the past 15 years. ?Fecal occult blood test (FOBT) of the stool. You may have this test every year starting at age 25. ?Flexible sigmoidoscopy or colonoscopy. You may have a sigmoidoscopy every 5 years or a colonoscopy every 10 years starting at age 69. ?Hepatitis C blood test. ?Hepatitis B blood test. ?Sexually transmitted disease (STD) testing. ?Diabetes screening. This is done by checking your blood sugar (glucose) after you have not eaten for a while (fasting). You may have this done every 1-3 years. ?Bone density scan. This is done to screen for osteoporosis. You may have this done  starting at age 69. ?Mammogram. This may be done every 1-2 years. Talk to your health care provider about how often you should have regular mammograms. ?Talk with your health care provider about  your test results, treatment options, and if necessary, the need for more tests. ?Vaccines  ?Your health care provider may recommend certain vaccines, such as: ?Influenza vaccine. This is recommended every year. ?Tetanus, diphtheria, and acellular pertussis (Tdap, Td) vaccine. You may need a Td booster every 10 years. ?Zoster vaccine. You may need this after age 69. ?Pneumococcal 13-valent conjugate (PCV13) vaccine. One dose is recommended after age 69. ?Pneumococcal polysaccharide (PPSV23) vaccine. One dose is recommended after age 69. ?Talk to your health care provider about which screenings and vaccines you need and how often you need them. ?This information is not intended to replace advice given to you by your health care provider. Make sure you discuss any questions you have with your health care provider. ?Document Released: 05/03/2015 Document Revised: 12/25/2015 Document Reviewed: 02/05/2015 ?Elsevier Interactive Patient Education ? 2017 [employer]vier [employer] ? ?Fall Prevention in the Home ?Falls can cause injuries. They can happen to people of all ages. There are many things you can do to make your home safe and to help prevent falls. ?What can I do on the outside of my home? ?Regularly fix the edges of walkways and driveways and fix any cracks. ?Remove anything that might make you trip as you walk through a door, such as a raised step or threshold. ?Trim any bushes or trees on the path to your home. ?Use bright outdoor lighting. ?Clear any walking paths of anything that might make someone trip, such as rocks or tools. ?Regularly check to see if handrails are loose or broken. Make sure that both sides of any steps have handrails. ?Any raised decks and porches should have guardrails on the edges. ?Have any leaves, snow, or ice cleared regularly. ?Use sand or salt on walking paths during winter. ?Clean up any spills in your garage right away. This includes oil or grease spills. ?What can I do in the bathroom? ?Use  night lights. ?Install grab bars by the toilet and in the tub and shower. Do not use towel bars as grab bars. ?Use non-skid mats or decals in the tub or shower. ?If you need to sit down in the shower, use a plastic, non-slip stool. ?Keep the floor dry. Clean up any water that spills on the floor as soon as it happens. ?Remove soap buildup in the tub or shower regularly. ?Attach bath mats securely with double-sided non-slip rug tape. ?Do not have throw rugs and other things on the floor that can make you trip. ?What can I do in the bedroom? ?Use night lights. ?Make sure that you have a light by your bed that is easy to reach. ?Do not use any sheets or blankets that are too big for your bed. They should not hang down onto the floor. ?Have a firm chair that has side arms. You can use this for support while you get dressed. ?Do not have throw rugs and other things on the floor that can make you trip. ?What can I do in the kitchen? ?Clean up any spills right away. ?Avoid walking on wet floors. ?Keep items that you use a lot in easy-to-reach places. ?If you need to reach something above you, use a strong step stool that has a grab bar. ?Keep electrical cords out of the way. ?Do not use floor polish or wax that  makes floors slippery. If you must use wax, use non-skid floor wax. ?Do not have throw rugs and other things on the floor that can make you trip. ?What can I do with my stairs? ?Do not leave any items on the stairs. ?Make sure that there are handrails on both sides of the stairs and use them. Fix handrails that are broken or loose. Make sure that handrails are as long as the stairways. ?Check any carpeting to make sure that it is firmly attached to the stairs. Fix any carpet that is loose or worn. ?Avoid having throw rugs at the top or bottom of the stairs. If you do have throw rugs, attach them to the floor with carpet tape. ?Make sure that you have a light switch at the top of the stairs and the bottom of the  stairs. If you do not have them, ask someone to add them for you. ?What else can I do to help prevent falls? ?Wear shoes that: ?Do not have high heels. ?Have rubber bottoms. ?Are comfortable and fit you wel

## 2021-07-29 NOTE — Telephone Encounter (Signed)
Checking on referral status to podiatry from 05/20/21 ?

## 2021-08-05 ENCOUNTER — Ambulatory Visit (INDEPENDENT_AMBULATORY_CARE_PROVIDER_SITE_OTHER): Payer: Medicare Other

## 2021-08-05 DIAGNOSIS — Z780 Asymptomatic menopausal state: Secondary | ICD-10-CM | POA: Diagnosis not present

## 2021-08-07 DIAGNOSIS — Z780 Asymptomatic menopausal state: Secondary | ICD-10-CM | POA: Diagnosis not present

## 2021-08-20 ENCOUNTER — Ambulatory Visit (INDEPENDENT_AMBULATORY_CARE_PROVIDER_SITE_OTHER): Payer: Medicare Other | Admitting: Licensed Clinical Social Worker

## 2021-08-20 DIAGNOSIS — I152 Hypertension secondary to endocrine disorders: Secondary | ICD-10-CM

## 2021-08-20 DIAGNOSIS — Z96651 Presence of right artificial knee joint: Secondary | ICD-10-CM

## 2021-08-20 DIAGNOSIS — E1169 Type 2 diabetes mellitus with other specified complication: Secondary | ICD-10-CM

## 2021-08-20 DIAGNOSIS — G4733 Obstructive sleep apnea (adult) (pediatric): Secondary | ICD-10-CM

## 2021-08-20 DIAGNOSIS — E1151 Type 2 diabetes mellitus with diabetic peripheral angiopathy without gangrene: Secondary | ICD-10-CM

## 2021-08-20 NOTE — Chronic Care Management (AMB) (Signed)
?Chronic Care Management  ? ? Clinical Social Work Note ? ?08/20/2021 ?Name: Jennifer [name] MRN: [id number] DOB: 01-12-1951 ? ?Jennifer [name] is a 71 y.o. year old female who is a primary care patient of Raliegh Ip, DO. The CCM team was consulted to assist the patient with chronic disease management and/or care coordination needs related to: [employer] .  ? ?Engaged with patient by telephone for follow up visit in response to provider referral for social work chronic care management and care coordination services.  ? ?Consent to Services:  ?The patient was given information about Chronic Care Management services, agreed to services, and gave verbal consent prior to initiation of services.  Please see initial visit note for detailed documentation.  ? ?Patient agreed to services and consent obtained.  ? ?Assessment: Review of patient past medical history, allergies, medications, and health status, including review of relevant consultants reports was performed today as part of a comprehensive evaluation and provision of chronic care management and care coordination services.    ? ?SDOH (Social Determinants of Health) assessments and interventions performed:  ?SDOH Interventions   ? ?Flowsheet Row Most Recent Value  ?SDOH Interventions   ?Physical Activity Interventions Other (Comments)  [client has walking challenges]  ?Stress Interventions Provide Counseling  [client has stress related to managing medical needs]  ? ?  ?  ? ?Advanced Directives Status: See [employer] application for related entries. ? ?CCM Care Plan ? ?Allergies  ?Allergen Reactions  ? Oxycodone Nausea And Vomiting  ?  Pt stated, "It makes me not want to eat"  ? Hydrocodone Itching  ? ? ?Outpatient Encounter Medications as of 08/20/2021  ?Medication Sig  ? acetaminophen (TYLENOL) 500 MG tablet Take 1,000 mg by mouth 2 (two) times daily as needed for mild pain or moderate pain.   ? atenolol (TENORMIN) 25 MG tablet Take 1 tablet (25 mg total)  by mouth daily.  ? atorvastatin (LIPITOR) 40 MG tablet Take 1 tablet (40 mg total) by mouth at bedtime.  ? calcium carbonate (TUMS - DOSED IN MG ELEMENTAL CALCIUM) 500 MG chewable tablet Chew 1,000 mg by mouth daily as needed for indigestion or heartburn.  ? Coenzyme Q-10 100 MG capsule Take 2 capsules (200 mg total) by mouth daily. (Patient taking differently: Take 100 mg by mouth daily.)  ? ibuprofen (ADVIL) 600 MG tablet Take 1 tablet (600 mg total) by mouth every 8 (eight) hours as needed for moderate pain (use sparingly).  ? magnesium hydroxide (MILK OF MAGNESIA) 400 MG/5ML suspension Take 15 mLs by mouth daily as needed for mild constipation.  ? metFORMIN (GLUCOPHAGE) 500 MG tablet Take 1 tablet (500 mg total) by mouth daily with breakfast.  ? Multiple Vitamins-Minerals (ADULT GUMMY PO) Take 1 capsule by mouth daily.  ? triamterene-hydrochlorothiazide (MAXZIDE-25) 37.5-25 MG tablet Take 1 tablet by mouth daily.  ? ?No facility-administered encounter medications on file as of 08/20/2021.  ? ? ?Patient Active Problem List  ? Diagnosis Date Noted  ? Moderate recurrent major depression (HCC) 07/29/2021  ? Unilateral primary osteoarthritis, right hip 12/17/2020  ? Status post total replacement of right hip 12/17/2020  ? Hip pain 07/24/2020  ? OSA on CPAP 04/04/2019  ? Peripheral arterial disease (HCC) 09/21/2017  ? 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 Chronic pain of left ankle 08/11/2017  ? Bilateral carpal tunnel syndrome 08/11/2017  ? Hypertension associated with diabetes (HCC) 08/11/2017  ? Morbid obesity (HCC) 08/11/2017  ?  Localized primary osteoarthritis of left lower leg 08/11/2017  ? Hyperlipidemia associated with type 2 diabetes mellitus (HCC) 08/11/2017  ? Tobacco use disorder 08/11/2017  ? History of adenomatous polyp of colon 07/23/2016  ? S/P total knee arthroplasty 02/12/2014  ? ? ?Conditions to be addressed/monitored: monitor client completion of  ADLs ? ?Care Plan : LCSW care plan  ?Updates made by Isaiah Blakes, LCSW since 08/20/2021 12:00 AM  ?  ? ?Problem: Coping Skills (General Plan of Care)   ?  ? ?Goal: Learn more about community resources to help client with mobility improvemet and help client with completion of daily ADLs   ?Start Date: 04/30/2021  ?Expected End Date: 09/22/2021  ?This Visit's Progress: On track  ?Recent Progress: On track  ?Priority: Medium  ?Note:   ?Current barriers:   ?Patient in need of assistance with connecting to community resources for help as needed with mobility improvement of client and to help client complete daily ADLs ?Patient is unable to independently navigate community resource options without care coordination support ?Mobility challenges ?Pain issues ?Ambulation challenges; risk for falls.  ? ?Clinical Goals:  ?patient will work with SW in next 30 days  to address concerns related to mobility challenges of client  ?Patient will work with SW in next 30 days to address concerns related to ADLs completion of client on daily basis  ?Patient will communicate with RNCM as needed for CCM support in next 30 days ? ?Clinical Interventions:  ?Collaboration with Raliegh Ip, DO regarding development and update of comprehensive plan of care as evidenced by provider attestation and co-signature ?Discussed mobility of client (has a cane and walker to use as needed) ?Discussed pain issues of client. She spoke of knee pain issue ?Reviewed family support  for client (spouse is supportive, daughter is supportive,son is supportive) ?Encouraged client to call RNCM as needed for nursing support for client ?Reviewed sleeping issues of client. She said she is using C-PAP to help her sleep. She said she is sleeping better ?Provided counseling support for client ?Reviewed medication procurement of client ?Reviewed mood status of client. She said she thought her mood was stable at this time ?Discussed with Athleen that LCSW had been  working with client for SW support for nearly 2 years and client was doing fairly well in many areas of daily functioning. RNCM is still working with client for CCM nursing support. LCSW informed Maui that LCSW would discharge client today from CCM SW services since client was functioning well and was stable with her mood. Client agreed to this plan.  LCSW thanked [place] for communicating with LCSW during these past months ?LCSW encouraged Sarissa to continue to communicate with RNCM Demetrios Loll as needed for nursing support. LCSW encouraged client to communicate as needed with [facility] Triage Nurse to discuss acute needs of client.   ? ?Patient Strengths: ?Takes medications as prescribed ?Attends scheduled medical appointments ?Self advocates as needed ?Has family support ? ?Patient Deficits ?Some Pain issues ?Mobility challenges ?Challenges in doing ADLs ? ?Patient Goals:  ?Attend scheduled medical appointments ?Take time for rest and relaxation and enjoyment of hobbies ?Advocate for self and for understanding of her medical conditions ?Talk with RNCM to address nursing needs ?Talk with LCSW about community resources of possible benefit to client ?-  ?Follow Up Plan: LCSW is discharging client today from CCM SW services.     ?  ?[name].Alana Dayton MSW, LCSW ?Licensed Clinical Social Worker ?[facility] Care Management ?[phone] ?

## 2021-08-20 NOTE — Patient Instructions (Addendum)
Visit Information ? ?Patient Goals: Find Help in My Community (Patient). Patient needs information about community resources to help her with mobility ?challenges, pain management, and to help her with completion of daily ADLs ? ?Timeframe:  Short-Term Goal ?Priority:  Medium ?Progress: On Track ?Start Date:   04/30/21                          ?Expected End Date:  09/23/21               ? ?Follow Up Date : LCSW is discharging client today from CCM SW services ?  ?Find Help in My Community (Patient) Patient needs information about community resources to help her with mobility challenges, pain management, and to help her with completion of daily ADLs ? ?Why is this important?   ?Knowing how and where to find help for yourself or family in your neighborhood and community is an important skill.  ?You will want to take some steps to learn how.   ? ?Patient Strengths: ?Takes medications as prescribed ?Attends scheduled medical appointments ?Self advocates as needed ?Has family support ? ?Patient Deficits ?Some Pain issues ?Mobility challenges ?Challenges in doing ADLs ? ?Patient Goals:  ?Attend scheduled medical appointments ?Take time for rest and relaxation and enjoyment of hobbies ?Advocate for self and for understanding of her medical conditions ?Talk with RNCM to address nursing needs ?Talk with LCSW about community resources of possible benefit to client ?-  ?Follow Up Plan: LCSW is discharging client today from CCM SW services ? ?[name].Telisha Zawadzki MSW, LCSW ?Licensed Clinical Social Worker ?[facility] Care Management ?[phone] ?

## 2021-09-17 DIAGNOSIS — E1151 Type 2 diabetes mellitus with diabetic peripheral angiopathy without gangrene: Secondary | ICD-10-CM

## 2021-09-17 DIAGNOSIS — E1159 Type 2 diabetes mellitus with other circulatory complications: Secondary | ICD-10-CM

## 2021-09-17 DIAGNOSIS — I152 Hypertension secondary to endocrine disorders: Secondary | ICD-10-CM

## 2021-09-17 DIAGNOSIS — E1169 Type 2 diabetes mellitus with other specified complication: Secondary | ICD-10-CM

## 2021-09-17 DIAGNOSIS — E785 Hyperlipidemia, unspecified: Secondary | ICD-10-CM

## 2021-10-03 DIAGNOSIS — G4733 Obstructive sleep apnea (adult) (pediatric): Secondary | ICD-10-CM | POA: Diagnosis not present

## 2021-10-16 DIAGNOSIS — Z1231 Encounter for screening mammogram for malignant neoplasm of breast: Secondary | ICD-10-CM | POA: Diagnosis not present

## 2021-10-29 ENCOUNTER — Ambulatory Visit: Payer: Self-pay | Admitting: *Deleted

## 2021-10-29 NOTE — Chronic Care Management (AMB) (Signed)
  Chronic Care Management   Note  10/29/2021 Name: Jennifer [name] MRN: [id number] DOB: April 28, 1950   Patient is stable from RN Care Management perspective or has not recently engaged with the RN Care Manager. I am removing RN Care Manager from Care Team and closing RN Care Management Care Plans. If patient is currently engaged with another CCM team member I will forward this encounter to inform them of my case closure. Patient may be eligible for re-engagement with RN Care Manager in the future if necessary and can discuss this with their PCP.  Demetrios Loll, BSN, RN-BC Embedded Chronic Care Manager [facility] Care Management Direct Dial: [phone]

## 2021-11-17 ENCOUNTER — Encounter: Payer: Self-pay | Admitting: Family Medicine

## 2021-11-17 ENCOUNTER — Ambulatory Visit (INDEPENDENT_AMBULATORY_CARE_PROVIDER_SITE_OTHER): Payer: Medicare Other | Admitting: Family Medicine

## 2021-11-17 VITALS — BP 109/78 | HR 70 | Temp 97.0°F | Ht 65.0 in | Wt 225.6 lb

## 2021-11-17 DIAGNOSIS — Z23 Encounter for immunization: Secondary | ICD-10-CM

## 2021-11-17 DIAGNOSIS — E1159 Type 2 diabetes mellitus with other circulatory complications: Secondary | ICD-10-CM

## 2021-11-17 DIAGNOSIS — I739 Peripheral vascular disease, unspecified: Secondary | ICD-10-CM | POA: Diagnosis not present

## 2021-11-17 DIAGNOSIS — Z9989 Dependence on other enabling machines and devices: Secondary | ICD-10-CM

## 2021-11-17 DIAGNOSIS — I152 Hypertension secondary to endocrine disorders: Secondary | ICD-10-CM | POA: Diagnosis not present

## 2021-11-17 DIAGNOSIS — E785 Hyperlipidemia, unspecified: Secondary | ICD-10-CM

## 2021-11-17 DIAGNOSIS — R1310 Dysphagia, unspecified: Secondary | ICD-10-CM

## 2021-11-17 DIAGNOSIS — Z0000 Encounter for general adult medical examination without abnormal findings: Secondary | ICD-10-CM

## 2021-11-17 DIAGNOSIS — G4733 Obstructive sleep apnea (adult) (pediatric): Secondary | ICD-10-CM | POA: Diagnosis not present

## 2021-11-17 DIAGNOSIS — Z0001 Encounter for general adult medical examination with abnormal findings: Secondary | ICD-10-CM | POA: Diagnosis not present

## 2021-11-17 DIAGNOSIS — R0602 Shortness of breath: Secondary | ICD-10-CM

## 2021-11-17 DIAGNOSIS — E1151 Type 2 diabetes mellitus with diabetic peripheral angiopathy without gangrene: Secondary | ICD-10-CM

## 2021-11-17 DIAGNOSIS — E1169 Type 2 diabetes mellitus with other specified complication: Secondary | ICD-10-CM

## 2021-11-17 DIAGNOSIS — Z720 Tobacco use: Secondary | ICD-10-CM | POA: Diagnosis not present

## 2021-11-17 LAB — BAYER DCA HB A1C WAIVED: HB A1C (BAYER DCA - WAIVED): 6.5 % — ABNORMAL HIGH (ref 4.8–5.6)

## 2021-11-17 MED ORDER — TRIAMTERENE-HCTZ 37.5-25 MG PO TABS: 0.5000 | ORAL_TABLET | Freq: Every day | ORAL | 3 refills | Status: DC

## 2021-11-17 MED ORDER — BREZTRI AEROSPHERE 160-9-4.8 MCG/ACT IN AERO: 2.0000 | INHALATION_SPRAY | Freq: Two times a day (BID) | RESPIRATORY_TRACT | 11 refills | Status: DC

## 2021-11-17 NOTE — Patient Instructions (Addendum)
Breztri sample provided today.  Inhale 2 puffs twice daily.  Rinse mouth after each use.    Preventive Care 68 Years and Older, Female Preventive care refers to lifestyle choices and visits with your health care provider that can promote health and wellness. Preventive care visits are also called wellness exams. What can I expect for my preventive care visit? Counseling Your health care provider may ask you questions about your: Medical history, including: Past medical problems. Family medical history. Pregnancy and menstrual history. History of falls. Current health, including: Memory and ability to understand (cognition). Emotional well-being. Home life and relationship well-being. Sexual activity and sexual health. Lifestyle, including: Alcohol, nicotine or tobacco, and drug use. Access to firearms. Diet, exercise, and sleep habits. Work and work Statistician. Sunscreen use. Safety issues such as seatbelt and bike helmet use. Physical exam Your health care provider will check your: Height and weight. These may be used to calculate your BMI (body mass index). BMI is a measurement that tells if you are at a healthy weight. Waist circumference. This measures the distance around your waistline. This measurement also tells if you are at a healthy weight and may help predict your risk of certain diseases, such as type 2 diabetes and high blood pressure. Heart rate and blood pressure. Body temperature. Skin for abnormal spots. What immunizations do I need?  Vaccines are usually given at various ages, according to a schedule. Your health care provider will recommend vaccines for you based on your age, medical history, and lifestyle or other factors, such as travel or where you work. What tests do I need? Screening Your health care provider may recommend screening tests for certain conditions. This may include: Lipid and cholesterol levels. Hepatitis C test. Hepatitis B test. HIV  (human immunodeficiency virus) test. STI (sexually transmitted infection) testing, if you are at risk. Lung cancer screening. Colorectal cancer screening. Diabetes screening. This is done by checking your blood sugar (glucose) after you have not eaten for a while (fasting). Mammogram. Talk with your health care provider about how often you should have regular mammograms. BRCA-related cancer screening. This may be done if you have a family history of breast, ovarian, tubal, or peritoneal cancers. Bone density scan. This is done to screen for osteoporosis. Talk with your health care provider about your test results, treatment options, and if necessary, the need for more tests. Follow these instructions at home: Eating and drinking  Eat a diet that includes fresh fruits and vegetables, whole grains, lean protein, and low-fat dairy products. Limit your intake of foods with high amounts of sugar, saturated fats, and salt. Take vitamin and mineral supplements as recommended by your health care provider. Do not drink alcohol if your health care provider tells you not to drink. If you drink alcohol: Limit how much you have to 0-1 drink a day. Know how much alcohol is in your drink. In the [place] one drink equals one 12 oz bottle of beer (355 mL), one 5 oz glass of wine (148 mL), or one 1 oz glass of hard liquor (44 mL). Lifestyle Brush your teeth every morning and night with fluoride toothpaste. Floss one time each day. Exercise for at least 30 minutes 5 or more days each week. Do not use any products that contain nicotine or tobacco. These products include cigarettes, chewing tobacco, and vaping devices, such as e-cigarettes. If you need help quitting, ask your health care provider. Do not use drugs. If you are sexually active, practice safe sex.  Use a condom or other form of protection in order to prevent STIs. Take aspirin only as told by your health care provider. Make sure that you understand  how much to take and what form to take. Work with your health care provider to find out whether it is safe and beneficial for you to take aspirin daily. Ask your health care provider if you need to take a cholesterol-lowering medicine (statin). Find healthy ways to manage stress, such as: Meditation, yoga, or listening to music. Journaling. Talking to a trusted person. Spending time with friends and family. Minimize exposure to UV radiation to reduce your risk of skin cancer. Safety Always wear your seat belt while driving or riding in a vehicle. Do not drive: If you have been drinking alcohol. Do not ride with someone who has been drinking. When you are tired or distracted. While texting. If you have been using any mind-altering substances or drugs. Wear a helmet and other protective equipment during sports activities. If you have firearms in your house, make sure you follow all gun safety procedures. What's next? Visit your health care provider once a year for an annual wellness visit. Ask your health care provider how often you should have your eyes and teeth checked. Stay up to date on all vaccines. This information is not intended to replace advice given to you by your health care provider. Make sure you discuss any questions you have with your health care provider. Document Revised: 10/02/2020 Document Reviewed: 10/02/2020 Elsevier Patient Education  [place].

## 2021-11-17 NOTE — Progress Notes (Signed)
Jennifer [name] is a 71 y.o. female presents to office today for annual physical exam examination.    Concerns today include: 1. Type 2 Diabetes with hypertension, hyperlipidemia:  Compliant with all meds.  Occasionally feels headache.  No dizziness, blurred vision, sensory changes Last eye exam: Up-to-date Last foot exam: Needs Last A1c:  Lab Results  Component Value Date   HGBA1C 6.5 (H) 11/17/2021   Nephropathy screen indicated?:  Needs Last flu, zoster and/or pneumovax:  Immunization History  Administered Date(s) Administered   Fluad Quad(high Dose 65+) 02/07/2019, 03/08/2020, 05/20/2021   Influenza-Unspecified 01/13/2018   Moderna Covid-19 Vaccine Bivalent Booster 23yr & up 06/26/2021   Moderna Sars-Cov-2 Peds vaccine 644yrthru 1124yr8/06/2020   Moderna Sars-Covid-2 Vaccination 05/25/2019, 06/22/2019, 03/13/2020   PNEUMOCOCCAL CONJUGATE-20 05/20/2021   Pneumococcal Conjugate-13 12/06/2018   Zoster Recombinat (Shingrix) 11/17/2021    Occupation: Retired, Marital status: Married, Substance use: Half pack per day 30 years Diet: Fair, Exercise: No structured Last eye exam: Up-to-date Last dental exam: Up-to-date Last colonoscopy: Up-to-date Last mammogram: Up-to-date Last pap smear: N/A Refills needed today: None Immunizations needed: Immunization History  Administered Date(s) Administered   Fluad Quad(high Dose 65+) 02/07/2019, 03/08/2020, 05/20/2021   Influenza-Unspecified 01/13/2018   Moderna Covid-19 Vaccine Bivalent Booster 18y71yrup 06/26/2021   Moderna Sars-Cov-2 Peds vaccine 63yrs29yru 8yrs73yr3/2022   Moderna Sars-Covid-2 Vaccination 05/25/2019, 06/22/2019, 03/13/2020   PNEUMOCOCCAL CONJUGATE-20 05/20/2021   Pneumococcal Conjugate-13 12/06/2018   Zoster Recombinat (Shingrix) 11/17/2021     Past Medical History:  Diagnosis Date   Arthritis    Dyspnea    Hyperlipidemia    Hypertension    Pre-diabetes    Sleep apnea    Social History    Socioeconomic History   Marital status: Married    Spouse name: [name]   Number of children: 3   Years of education: 12   Highest education level: Not on file  Occupational History   Occupation: CNa    Employer: [employer]  Tobacco Use   Smoking status: Every Day    Packs/day: 0.50    Years: 35.00    Total pack years: 17.50    Types: Cigarettes   Smokeless tobacco: Never  Vaping Use   Vaping Use: Never used  Substance and Sexual Activity   Alcohol use: No   Drug use: No   Sexual activity: Yes  Other Topics Concern   Not on file  Social History Narrative   She lives with her husband   One son passed away from sepsis in 2021  June 01, 2019 son lives with her now   Social Determinants of Health   Financial Resource Strain: Low Risk  (07/29/2021)   Overall Financial Resource Strain (CARDIA)    Difficulty of Paying Living Expenses: Not hard at all  Food Insecurity: No Food Insecurity (07/29/2021)   Hunger Vital Sign    Worried About Running Out of Food in the Last Year: Never true    Ran Out of Food in the Last Year: Never true  Transportation Needs: No Transportation Needs (07/29/2021)   PRAPARE - TranspHydrologistcal): No    Lack of Transportation (Non-Medical): No  Physical Activity: Inactive (08/20/2021)   Exercise Vital Sign    Days of Exercise per Week: 0 days    Minutes of Exercise per Session: 0 min  Stress: Stress Concern Present (08/20/2021)   [place]  of Stress : To some extent  Social Connections: Socially Integrated (07/29/2021)   Social Connection and Isolation Panel [NHANES]    Frequency of Communication with Friends and Family: More than three times a week    Frequency of Social Gatherings with Friends and Family: More than three times a week    Attends Religious Services: More than 4 times per year    Active Member of Clubs or Organizations: Yes    Attends  Archivist Meetings: More than 4 times per year    Marital Status: Married  Human resources officer Violence: Not At Risk (07/29/2021)   Humiliation, Afraid, Rape, and Kick questionnaire    Fear of Current or Ex-Partner: No    Emotionally Abused: No    Physically Abused: No    Sexually Abused: No   Past Surgical History:  Procedure Laterality Date   ABDOMINAL HYSTERECTOMY     COLONOSCOPY N/A 11/25/2016   Procedure: COLONOSCOPY;  Surgeon: Rogene Houston, MD;  [facility];  Service: Endoscopy;  Laterality: N/A;  [place]     FOOT SURGERY Left 2018   reconstructed tendon   KNEE ARTHROSCOPY     BIL   2010    LUNG BIOPSY     RIGHT 30 YRS AGO    TOTAL HIP ARTHROPLASTY Right 12/17/2020   Procedure: RIGHT TOTAL HIP ARTHROPLASTY ANTERIOR APPROACH;  Surgeon: Mcarthur Rossetti, MD;  Location: [facility];  Service: Orthopedics;  Laterality: Right;   TOTAL KNEE ARTHROPLASTY Right 02/12/2014   Procedure: TOTAL KNEE ARTHROPLASTY;  Surgeon: Vickey Huger, MD;  Location: [facility];  Service: Orthopedics;  Laterality: Right;   Family History  Problem Relation Age of Onset   Diabetes Mother    Stroke Mother    Alcohol abuse Father    Stroke Father    Colon cancer Neg Hx     Current Outpatient Medications:    acetaminophen (TYLENOL) 500 MG tablet, Take 1,000 mg by mouth 2 (two) times daily as needed for mild pain or moderate pain. , Disp: , Rfl:    atenolol (TENORMIN) 25 MG tablet, Take 1 tablet (25 mg total) by mouth daily., Disp: 90 tablet, Rfl: 3   atorvastatin (LIPITOR) 40 MG tablet, Take 1 tablet (40 mg total) by mouth at bedtime., Disp: 90 tablet, Rfl: 3   calcium carbonate (TUMS - DOSED IN MG ELEMENTAL CALCIUM) 500 MG chewable tablet, Chew 1,000 mg by mouth daily as needed for indigestion or heartburn., Disp: , Rfl:    Coenzyme Q-10 100 MG capsule, Take 2 capsules (200 mg total) by mouth daily. (Patient taking differently: Take 100 mg by mouth daily.), Disp: 60 capsule,  Rfl: 6   ibuprofen (ADVIL) 600 MG tablet, Take 1 tablet (600 mg total) by mouth every 8 (eight) hours as needed for moderate pain (use sparingly)., Disp: 270 each, Rfl: 1   magnesium hydroxide (MILK OF MAGNESIA) 400 MG/5ML suspension, Take 15 mLs by mouth daily as needed for mild constipation., Disp: , Rfl:    metFORMIN (GLUCOPHAGE) 500 MG tablet, Take 1 tablet (500 mg total) by mouth daily with breakfast., Disp: 90 tablet, Rfl: 3   Multiple Vitamins-Minerals (ADULT GUMMY PO), Take 1 capsule by mouth daily., Disp: , Rfl:    triamterene-hydrochlorothiazide (MAXZIDE-25) 37.5-25 MG tablet, Take 1 tablet by mouth daily., Disp: 90 tablet, Rfl: 3  Allergies  Allergen Reactions   Oxycodone Nausea And Vomiting    Pt stated, "It makes me not want to eat"  Hydrocodone Itching     ROS: Review of Systems A comprehensive review of systems was negative except for: Constitutional: positive for fatigue and this seems to be associated with skipping CPAP Respiratory: positive for dyspnea on exertion Gastrointestinal: positive for dysphagia    Physical exam BP 109/78   Pulse 70   Temp (!) 97 F (36.1 C)   Ht _0  (1.651 m)   Wt 225 lb 9.6 oz (102.3 kg)   SpO2 95%   BMI 37.54 kg/m  General appearance: alert, cooperative, appears stated age, no distress, and moderately obese Head: Normocephalic, without obvious abnormality, atraumatic Eyes: negative findings: lids and lashes normal, conjunctivae and sclerae normal, corneas clear, and pupils equal, round, reactive to light and accomodation Ears: normal TM's and external ear canals both ears Nose: Nares normal. Septum midline. Mucosa normal. No drainage or sinus tenderness. Throat:  Dentition fair.  Oropharynx without any masses.  No sublingual masses Neck: no adenopathy, no carotid bruit, supple, symmetrical, trachea midline, and thyroid not enlarged, symmetric, no tenderness/mass/nodules Back: symmetric, no curvature. ROM normal. No CVA  tenderness. Lungs:  Air movement fair throughout.  Mild global expiratory wheeze appreciated.  Normal work of breathing on room air. Heart: regular rate and rhythm, S1, S2 normal, no murmur, click, rub or gallop Abdomen: soft, non-tender; bowel sounds normal; no masses,  no organomegaly Extremities: extremities normal, atraumatic, no cyanosis or edema Pulses:  +1 pedal pulses bilaterally Skin:  Several skin tags along the face Lymph nodes: Cervical, supraclavicular, and axillary nodes normal. Neurologic: Grossly normal Psych: Mood stable, speech normal, affect appropriate  Diabetic Foot Exam - Simple   Simple Foot Form Diabetic Foot exam was performed with the following findings: Yes 11/17/2021 12:19 PM  Visual Inspection See comments: Yes Sensation Testing Intact to touch and monofilament testing bilaterally: Yes Pulse Check See comments: Yes Comments +1 pedal pulses.  She has onychomycotic changes throughout bilateral feet     Assessment/ Plan: Tasia Catchings here for annual physical exam.   Annual physical exam  Type 2 diabetes mellitus with peripheral circulatory disorder ([place]) - Plan: CMP14+EGFR, Bayer DCA Hb A1c Waived, Microalbumin / creatinine urine ratio, AMB Referral to [facility]  Hypertension associated with diabetes ([place]) - Plan: CMP14+EGFR, triamterene-hydrochlorothiazide (MAXZIDE-25) 37.5-25 MG tablet  Hyperlipidemia associated with type 2 diabetes mellitus ([place]) - Plan: CMP14+EGFR, Lipid Panel, TSH  OSA on CPAP  Morbid obesity ([place]) - Plan: AMB Referral to [facility]  Peripheral arterial disease ([place]) - Plan: CBC, AMB Referral to Community Care Coordinaton  Tobacco use - Plan: Budeson-Glycopyrrol-Formoterol (BREZTRI AEROSPHERE) 160-9-4.8 MCG/ACT AERO, AMB Referral to Community Care Coordinaton  Shortness of breath - Plan: Budeson-Glycopyrrol-Formoterol (BREZTRI AEROSPHERE) 160-9-4.8 MCG/ACT AERO, AMB Referral to Community  Care Coordinaton  Dysphagia, unspecified type  Foot exam performed today.  She will have eye exam done later.  Urine microalbumin ordered.  A1c remains under excellent control.  No changes  Blood pressure remains controlled.  In fact I think it is a little too low so would like her to start cutting her Maxide in half.  Continue atenolol at current dose  Continue statin.  Fasting lipid obtained today  Known PAD.  She has +1 pedal pulses on exam but no other concerning symptoms or signs  I suspect she has an underlying COPD given ongoing tobacco use and dyspnea.  She had mild wheezes on exhalation today.  Trial of Breztri.  Sample provided.  First dose administered in office.  Discussed rinsing  mouth after each use.  Rx sent to pharmacy but anticipate she will need patient assistance so referral to CCM also placed.  Patient is contemplative about tobacco cessation.  We will CC her gastroenterologist regarding her dysphagia.  Wonder if he might be able to add EGD to her next colonoscopy  Counseled on healthy lifestyle choices, including diet (rich in fruits, vegetables and lean meats and low in salt and simple carbohydrates) and exercise (at least 30 minutes of moderate physical activity daily).  Patient to follow up in 51m Winnie Barsky M. GLajuana Ripple DO

## 2021-11-18 ENCOUNTER — Telehealth: Payer: Self-pay

## 2021-11-18 ENCOUNTER — Other Ambulatory Visit: Payer: Self-pay | Admitting: Family Medicine

## 2021-11-18 DIAGNOSIS — E1169 Type 2 diabetes mellitus with other specified complication: Secondary | ICD-10-CM

## 2021-11-18 LAB — CMP14+EGFR
ALT: 15 IU/L (ref 0–32)
AST: 17 IU/L (ref 0–40)
Albumin/Globulin Ratio: 1.6 (ref 1.2–2.2)
Albumin: 4.5 g/dL (ref 3.9–4.9)
Alkaline Phosphatase: 102 IU/L (ref 44–121)
BUN/Creatinine Ratio: 22 (ref 12–28)
BUN: 17 mg/dL (ref 8–27)
Bilirubin Total: 0.4 mg/dL (ref 0.0–1.2)
CO2: 22 mmol/L (ref 20–29)
Calcium: 9.8 mg/dL (ref 8.7–10.3)
Chloride: 98 mmol/L (ref 96–106)
Creatinine, Ser: 0.77 mg/dL (ref 0.57–1.00)
Globulin, Total: 2.9 g/dL (ref 1.5–4.5)
Glucose: 96 mg/dL (ref 70–99)
Potassium: 4 mmol/L (ref 3.5–5.2)
Sodium: 137 mmol/L (ref 134–144)
Total Protein: 7.4 g/dL (ref 6.0–8.5)
eGFR: 84 mL/min/{1.73_m2} (ref >59)

## 2021-11-18 LAB — LIPID PANEL
Chol/HDL Ratio: 4.8 ratio — ABNORMAL HIGH (ref 0.0–4.4)
Cholesterol, Total: 154 mg/dL (ref 100–199)
HDL: 32 mg/dL — ABNORMAL LOW (ref >39)
LDL Chol Calc (NIH): 55 mg/dL (ref 0–99)
Triglycerides: 445 mg/dL — ABNORMAL HIGH (ref 0–149)
VLDL Cholesterol Cal: 67 mg/dL — ABNORMAL HIGH (ref 5–40)

## 2021-11-18 LAB — CBC
Hematocrit: 41.9 % (ref 34.0–46.6)
Hemoglobin: 14.2 g/dL (ref 11.1–15.9)
MCH: 29.6 pg (ref 26.6–33.0)
MCHC: 33.9 g/dL (ref 31.5–35.7)
MCV: 87 fL (ref 79–97)
Platelets: 251 10*3/uL (ref 150–450)
RBC: 4.8 x10E6/uL (ref 3.77–5.28)
RDW: 13.2 % (ref 11.7–15.4)
WBC: 8.1 10*3/uL (ref 3.4–10.8)

## 2021-11-18 LAB — MICROALBUMIN / CREATININE URINE RATIO
Creatinine, Urine: 77.8 mg/dL
Microalb/Creat Ratio: 8 mg/g creat (ref 0–29)
Microalbumin, Urine: 6.2 ug/mL

## 2021-11-18 LAB — TSH: TSH: 0.682 u[IU]/mL (ref 0.450–4.500)

## 2021-11-18 MED ORDER — FENOFIBRATE 48 MG PO TABS: 48.0000 mg | ORAL_TABLET | Freq: Every day | ORAL | 3 refills | Status: DC

## 2021-11-18 NOTE — Telephone Encounter (Signed)
Can you schedule pt with julie for 2 weeks?

## 2021-11-19 ENCOUNTER — Telehealth: Payer: Self-pay

## 2021-11-19 NOTE — Telephone Encounter (Signed)
1st available is okay! thanks

## 2021-11-19 NOTE — Chronic Care Management (AMB) (Signed)
  Chronic Care Management   Note  11/19/2021 Name: Jennifer [name] MRN: [id number] DOB: Aug 04, 1950  Jennifer [name] is a 71 y.o. year old female who is a primary care patient of Janora Norlander, DO. I reached out to [employer] by phone today in response to a referral sent by [name]'s PCP.  [name] was given information about Chronic Care Management services today including:  CCM service includes personalized support from designated clinical staff supervised by her physician, including individualized plan of care and coordination with other care providers 24/7 contact phone numbers for assistance for urgent and routine care needs. Service will only be billed when office clinical staff spend 20 minutes or more in a month to coordinate care. Only one practitioner may furnish and bill the service in a calendar month. The patient may stop CCM services at any time (effective at the end of the month) by phone call to the office staff. The patient is responsible for co-pay (up to 20% after annual deductible is met) if co-pay is required by the individual health plan.   Patient agreed to services and verbal consent obtained.   Follow up plan: Telephone appointment with care management team member scheduled for:12/19/2021  Noreene Larsson, [place], [place] [address] Direct Dial: [phone] [email or web address]

## 2021-12-19 ENCOUNTER — Ambulatory Visit (INDEPENDENT_AMBULATORY_CARE_PROVIDER_SITE_OTHER): Payer: Medicare Other | Admitting: Pharmacist

## 2021-12-19 DIAGNOSIS — E1169 Type 2 diabetes mellitus with other specified complication: Secondary | ICD-10-CM

## 2021-12-19 DIAGNOSIS — R0602 Shortness of breath: Secondary | ICD-10-CM

## 2021-12-19 DIAGNOSIS — Z720 Tobacco use: Secondary | ICD-10-CM

## 2022-01-02 DIAGNOSIS — G4733 Obstructive sleep apnea (adult) (pediatric): Secondary | ICD-10-CM | POA: Diagnosis not present

## 2022-01-06 MED ORDER — BREZTRI AEROSPHERE 160-9-4.8 MCG/ACT IN AERO: 2.0000 | INHALATION_SPRAY | Freq: Two times a day (BID) | RESPIRATORY_TRACT | 11 refills | Status: DC

## 2022-01-06 NOTE — Progress Notes (Signed)
Chronic Care Management Pharmacy Note  12/19/2021 Name:  Jennifer [name] MRN:  [id number] DOB:  Apr 16, 1951  Summary:  Diabetes: New goal. Controlled; current treatment: METFORMIN (monotherapy) A1c 6.5% GFR 84% Consider SGLT2 VS GLP1 for cardiac protection, weight loss (GLP1), additional benefits beyond what metformin can offer Cost is an issue  (coverage gap), however we can apply for additional patient assistance Current glucose readings: fasting glucose: <135-140, post prandial glucose: n/a Denies hypoglycemic/hyperglycemic symptoms Discussed meal planning options and Plate method for healthy eating Avoid sugary drinks and desserts Incorporate balanced protein, non starchy veggies, 1 serving of carbohydrate with each meal Increase water intake Increase physical activity as able Current exercise: n/a; encouraged as physically able and as directed by your physician  Educated on T2DM, diet, medications Recommended starting SGTL2 VS GLP1 in the near future  Chronic Obstructive Pulmonary Disease:  New goal. Uncontrolled; current treatment: Breztri (2 puffs twice daily, rinse mouth after each use), albuterol rescue;  Suspected COPD  PCP suspects she has an underlying COPD given ongoing tobacco use and dyspnea Mild wheezes on exhalation/exam by PCP today.  Continue Breztri Additional samples provided Patient able to use teach back and using inhaler appropriately Most recent Pulmonary Function Testing:  n/a 0 exacerbations requiring treatment in the last 6 months  Recommended continue Breztri (ease of patient assistance) Collaborated with PCP on drug therapy and patient assistance Assessed patient finances. [place] for Breztri (triple therapy inhaler)--wills hip to patient's home via [facility] mail order pharmacy)   Subjective: Jennifer [name] is an 71 y.o. year old female who is a primary patient of Janora Norlander, DO.  The CCM team  was consulted for assistance with disease management and care coordination needs.    Engaged with patient by telephone for initial visit in response to provider referral for pharmacy case management and/or care coordination services.   Consent to Services:  The patient was given information about Chronic Care Management services, agreed to services, and gave verbal consent prior to initiation of services.  Please see initial visit note for detailed documentation.   Patient Care Team: Janora Norlander, DO as PCP - General (Family Medicine) Lorretta Harp, MD as Consulting Physician (Cardiology) Okey Regal, OD (Optometry) Lavera Guise, [facility] as Pharmacist (Family Medicine)  Objective:  Lab Results  Component Value Date   CREATININE 0.77 11/17/2021   CREATININE 0.64 12/18/2020   CREATININE 0.75 12/06/2020    Lab Results  Component Value Date   HGBA1C 6.5 (H) 11/17/2021   Last diabetic Eye exam:  Lab Results  Component Value Date/Time   HMDIABEYEEXA No Retinopathy 06/10/2020 12:00 AM    Last diabetic Foot exam: No results found for: "HMDIABFOOTEX"      Component Value Date/Time   CHOL 154 11/17/2021 1110   TRIG 445 (H) 11/17/2021 1110   HDL 32 (L) 11/17/2021 1110   CHOLHDL 4.8 (H) 11/17/2021 1110   LDLCALC 55 11/17/2021 1110       Latest Ref Rng & Units 11/17/2021   11:10 AM 09/27/2020    2:11 PM 07/20/2019    3:36 PM  Hepatic Function  Total Protein 6.0 - 8.5 g/dL 7.4  7.6  7.2   Albumin 3.9 - 4.9 g/dL 4.5  4.5  4.1   AST 0 - 40 IU/L 17  21  15    ALT 0 - 32 IU/L 15  20  12    Alk Phosphatase 44 - 121 IU/L 102  119  115   Total Bilirubin 0.0 - 1.2 mg/dL 0.4  <0.2  <0.2     Lab Results  Component Value Date/Time   TSH 0.682 11/17/2021 11:10 AM       Latest Ref Rng & Units 11/17/2021   11:10 AM 12/18/2020    1:12 AM 12/06/2020    3:00 PM  CBC  WBC 3.4 - 10.8 x10E3/uL 8.1  11.0  7.3   Hemoglobin 11.1 - 15.9 g/dL 14.2  11.9  13.6   Hematocrit 34.0 -  46.6 % 41.9  36.3  41.3   Platelets 150 - 450 x10E3/uL 251  246  266     No results found for: "VD25OH"  Clinical ASCVD: No  The 10-year ASCVD risk score (Arnett DK, et al., 2019) is: 26.5%   Values used to calculate the score:     Age: 89 years     Sex: Female     Is Non-Hispanic African American: Yes     Diabetic: Yes     Tobacco smoker: Yes     Systolic Blood Pressure: 093 mmHg     Is BP treated: Yes     HDL Cholesterol: 32 mg/dL     Total Cholesterol: 154 mg/dL    Other: (CHADS2VASc if Afib, PHQ9 if depression, MMRC or CAT for COPD, ACT, DEXA)  Social History   Tobacco Use  Smoking Status Every Day   Packs/day: 0.50   Years: 35.00   Total pack years: 17.50   Types: Cigarettes  Smokeless Tobacco Never   BP Readings from Last 3 Encounters:  11/17/21 109/78  05/20/21 132/85  12/19/20 110/64   Pulse Readings from Last 3 Encounters:  11/17/21 70  05/20/21 73  12/19/20 100   Wt Readings from Last 3 Encounters:  11/17/21 225 lb 9.6 oz (102.3 kg)  07/29/21 223 lb (101.2 kg)  05/20/21 223 lb 6.4 oz (101.3 kg)    Assessment: Review of patient past medical history, allergies, medications, health status, including review of consultants reports, laboratory and other test data, was performed as part of comprehensive evaluation and provision of chronic care management services.   SDOH:  (Social Determinants of Health) assessments and interventions performed:  SDOH Interventions    Flowsheet Row Chronic Care Management from 08/20/2021 in [place] from 07/29/2021 in [place] Management from 04/30/2021 in [place] Management from 02/28/2021 in [place] Management from 01/14/2021 in [place] Management from 12/04/2020 in [place] Interventions        Food  Insecurity Interventions -- Intervention Not Indicated -- -- -- --  Housing Interventions -- Intervention Not Indicated -- -- -- --  Transportation Interventions -- Intervention Not Indicated -- -- -- --  Depression Interventions/Treatment  -- PHQ2-9 Score <4 Follow-up Not Indicated --  [informed client of LCSW support and of RNCM support] --  [informed client of LCSW support and of RNCM support] --  [informed client of LCSW support and of RNCM support] --  [informed client of LCSW support and of RNCM support]  Financial Strain Interventions -- Intervention Not Indicated -- -- -- --  Physical Activity Interventions Other (Comments)  [client has walking challenges] Patient Refused, Other (Comments) Other (Comments)  [walking challenges.she has a cane and a walker to uses as needed to help her walk] Other (Comments)  [client has walking challenges,  she uses  a cane to help her walk] Other (Comments)  [walking challenges,  uses a cane to help her walk] Other (Comments)  [Hip pain issues]  Stress Interventions Provide Counseling  [client has stress related to managing medical needs] Intervention Not Indicated Provide Counseling  [client has stress related to managing her health needs. she has stress related to the health needs of her spouse] Provide Counseling  [client has stress related to managing her health needs. client has stress related to health needs of her spouse] -- --  Social Connections Interventions -- Intervention Not Indicated -- -- -- --       CCM Care Plan  Allergies  Allergen Reactions   Oxycodone Nausea And Vomiting    Pt stated, "It makes me not want to eat"   Hydrocodone Itching    Medications Reviewed Today     Reviewed by Lavera Guise, [facility] (Pharmacist) on 01/06/22 at [place] List Status: <None>   Medication Order Taking? Sig Documenting Provider Last Dose Status Informant  acetaminophen (TYLENOL) 500 MG tablet [id number] No Take 1,000 mg by mouth 2 (two) times daily  as needed for mild pain or moderate pain.  [provider] Taking Active Self  atenolol (TENORMIN) 25 MG tablet [id number] No Take 1 tablet (25 mg total) by mouth daily. Ronnie Doss M, DO Taking Active   atorvastatin (LIPITOR) 40 MG tablet [id number] No Take 1 tablet (40 mg total) by mouth at bedtime. Ronnie Doss M, DO Taking Active   Budeson-Glycopyrrol-Formoterol (BREZTRI AEROSPHERE) 160-9-4.8 MCG/ACT Hollie Salk [id number]  Inhale 2 puffs into the lungs 2 (two) times daily. Rinse mouth out after Janora Norlander, DO  Active   calcium carbonate (TUMS - DOSED IN MG ELEMENTAL CALCIUM) 500 MG chewable tablet [id number] No Chew 1,000 mg by mouth daily as needed for indigestion or heartburn. [provider] Taking Active Self  Coenzyme Q-10 100 MG capsule [id number] No Take 2 capsules (200 mg total) by mouth daily.  Patient taking differently: Take 100 mg by mouth daily.   Lorretta Harp, MD Taking Active Self  fenofibrate (TRICOR) 48 MG tablet [id number]  Take 1 tablet (48 mg total) by mouth daily. For triglycerides Ronnie Doss M, DO  Active   ibuprofen (ADVIL) 600 MG tablet [id number] No Take 1 tablet (600 mg total) by mouth every 8 (eight) hours as needed for moderate pain (use sparingly). Ronnie Doss M, DO Taking Active   magnesium hydroxide (MILK OF MAGNESIA) 400 MG/5ML suspension [id number] No Take 15 mLs by mouth daily as needed for mild constipation. [provider] Taking Active Self  metFORMIN (GLUCOPHAGE) 500 MG tablet [id number] No Take 1 tablet (500 mg total) by mouth daily with breakfast. Janora Norlander, DO Taking Active   Multiple Vitamins-Minerals (ADULT GUMMY PO) [id number] No Take 1 capsule by mouth daily. [provider] Taking Active Self  triamterene-hydrochlorothiazide (MAXZIDE-25) 37.5-25 MG tablet [id number]  Take 0.5 tablets by mouth daily. Janora Norlander, DO  Active             Patient Active Problem List    Diagnosis Date Noted   Moderate recurrent major depression ([place]) 07/29/2021   Unilateral primary osteoarthritis, right hip 12/17/2020   Status post total replacement of right hip 12/17/2020   Hip pain 07/24/2020   OSA on CPAP 04/04/2019   Peripheral arterial disease ([place]) 09/21/2017   Type 2 diabetes, with peripheral circulatory disorder not at goal [facility]) 08/16/2017   Chronic pain of left ankle 08/11/2017  Bilateral carpal tunnel syndrome 08/11/2017   Hypertension associated with diabetes ([place]) 08/11/2017   Morbid obesity ([place]) 08/11/2017   Localized primary osteoarthritis of left lower leg 08/11/2017   Hyperlipidemia associated with type 2 diabetes mellitus ([place]) 08/11/2017   Tobacco use disorder 08/11/2017   History of adenomatous polyp of colon 07/23/2016   S/P total knee arthroplasty 02/12/2014    Immunization History  Administered Date(s) Administered   Fluad Quad(high Dose 65+) 02/07/2019, 03/08/2020, 05/20/2021   Influenza-Unspecified 01/13/2018   Moderna Covid-19 Vaccine Bivalent Booster 86yr & up 06/26/2021   Moderna Sars-Cov-2 Peds vaccine 666yrthru 1165yr8/06/2020   Moderna Sars-Covid-2 Vaccination 05/25/2019, 06/22/2019, 03/13/2020   PNEUMOCOCCAL CONJUGATE-20 05/20/2021   Pneumococcal Conjugate-13 12/06/2018   Zoster Recombinat (Shingrix) 11/17/2021    Conditions to be addressed/monitored: HTN, HLD, COPD, DMII, and CKD Stage 2  Care Plan : PHARMD MEDICATION MANAGEMENT  Updates made by PruLavera GuisePH since 01/06/2022 12:00 AM     Problem: DISEASE PROGRESSION PREVENTION      Long-Range Goal: T2DM, HTN, HLD, COPD   This Visit's Progress: Not on track  Priority: High  Note:   Current Barriers:  Unable to independently afford treatment regimen Suboptimal therapeutic regimen for COPD, T2DM  Pharmacist Clinical Goal(s):  patient will verbalize ability to afford treatment regimen maintain control of T2DM, COPD as evidenced by IMPROVED QUALITY OF LIFE,  IMPROVED BREATHING, GOAL A1C <7%  adhere to plan to optimize therapeutic regimen for T2DM, COPD as evidenced by report of adherence to recommended medication management changes through collaboration with PharmD and provider.   Interventions: 1:1 collaboration with GotJanora NorlanderO regarding development and update of comprehensive plan of care as evidenced by provider attestation and co-signature Inter-disciplinary care team collaboration (see longitudinal plan of care) Comprehensive medication review performed; medication list updated in electronic medical record  Diabetes: New goal. Controlled; current treatment: METFORMIN (monotherapy) A1c 6.5% GFR 84% Consider SGLT2 VS GLP1 for cardiac protection, weight loss (GLP1), additional benefits beyond what metformin can offer Cost is an issue  (coverage gap), however we can apply for additional patient assistance Current glucose readings: fasting glucose: <135-140, post prandial glucose: n/a Denies hypoglycemic/hyperglycemic symptoms Discussed meal planning options and Plate method for healthy eating Avoid sugary drinks and desserts Incorporate balanced protein, non starchy veggies, 1 serving of carbohydrate with each meal Increase water intake Increase physical activity as able Current exercise: n/a; encouraged as physically able and as directed by your physician  Educated on T2DM, diet, medications Recommended starting SGTL2 VS GLP1 in the near future  Chronic Obstructive Pulmonary Disease:  New goal. Uncontrolled; current treatment: Breztri (2 puffs twice daily, rinse mouth after each use), albuterol rescue;  Suspected COPD  PCP suspects she has an underlying COPD given ongoing tobacco use and dyspnea Mild wheezes on exhalation/exam by PCP today.  Continue Breztri Additional samples provided Patient able to use teach back and using inhaler appropriately Most recent Pulmonary Function Testing:  n/a 0 exacerbations requiring  treatment in the last 6 months  Recommended continue Breztri (ease of patient assistance) Collaborated with PCP on drug therapy and patient assistance Assessed patient finances. [place] for Breztri (triple therapy inhaler)--wills hip to patient's home via [facility] mail order pharmacy)   Patient Goals/Self-Care Activities patient will:  - take medications as prescribed as evidenced by patient report and record review check glucose DAILY FASTING OR IF SYMPTOMATIC, document, and provide at future appointments collaborate with provider on medication access solutions  target a minimum of 150 minutes of moderate intensity exercise weekly engage in dietary modifications by   FOLLOWING A HEART HEALTHY DIET/HEALTHY PLATE METHOD       Medication Assistance: Application for [place]&me [place]breztri)  medication assistance program. in process.  Anticipated assistance start date TBD--faxed on 01/06/22.  See plan of care for additional detail.  Follow Up:  Patient agrees to Care Plan and Follow-up.  Plan: Telephone follow up appointment with care management team member scheduled for:  1 month--T2DM   Regina Eck, PharmD, BCPS Clinical Pharmacist, [place]  [place] Phone [phone]

## 2022-01-06 NOTE — Patient Instructions (Addendum)
Visit Information  Following are the goals we discussed today:  Current Barriers:  Unable to independently afford treatment regimen Suboptimal therapeutic regimen for COPD, T2DM  Pharmacist Clinical Goal(s):  patient will verbalize ability to afford treatment regimen maintain control of T2DM, COPD as evidenced by IMPROVED QUALITY OF LIFE, IMPROVED BREATHING, GOAL A1C <7%  adhere to plan to optimize therapeutic regimen for T2DM, COPD as evidenced by report of adherence to recommended medication management changes through collaboration with PharmD and provider.   Interventions: 1:1 collaboration with Janora Norlander, DO regarding development and update of comprehensive plan of care as evidenced by provider attestation and co-signature Inter-disciplinary care team collaboration (see longitudinal plan of care) Comprehensive medication review performed; medication list updated in electronic medical record  Diabetes: New goal. Controlled; current treatment: METFORMIN (monotherapy) A1c 6.5% GFR 84% Consider SGLT2 VS GLP1 for cardiac protection, weight loss (GLP1), additional benefits beyond what metformin can offer Cost is an issue  (coverage gap), however we can apply for additional patient assistance Current glucose readings: fasting glucose: <135-140, post prandial glucose: n/a Denies hypoglycemic/hyperglycemic symptoms Discussed meal planning options and Plate method for healthy eating Avoid sugary drinks and desserts Incorporate balanced protein, non starchy veggies, 1 serving of carbohydrate with each meal Increase water intake Increase physical activity as able Current exercise: n/a; encouraged as physically able and as directed by your physician  Educated on T2DM, diet, medications Recommended starting SGTL2 VS GLP1 in the near future  Chronic Obstructive Pulmonary Disease:  New goal. Uncontrolled; current treatment: Breztri (2 puffs twice daily, rinse mouth after each use),  albuterol rescue;  Suspected COPD  PCP suspects she has an underlying COPD given ongoing tobacco use and dyspnea Mild wheezes on exhalation/exam by PCP today.  Continue Breztri Additional samples provided Patient able to use teach back and using inhaler appropriately Most recent Pulmonary Function Testing:  n/a 0 exacerbations requiring treatment in the last 6 months  Recommended continue Breztri (ease of patient assistance) Collaborated with PCP on drug therapy and patient assistance Assessed patient finances. Application submitted to [place]&me patient assistance for Breztri (triple therapy inhaler)--wills hip to patient's home via [facility] mail order pharmacy)   Patient Goals/Self-Care Activities patient will:  - take medications as prescribed as evidenced by patient report and record review check glucose DAILY FASTING OR IF SYMPTOMATIC, document, and provide at future appointments collaborate with provider on medication access solutions target a minimum of 150 minutes of moderate intensity exercise weekly engage in dietary modifications by FOLLOWING A HEART HEALTHY DIET/HEALTHY PLATE METHOD      Plan: Telephone follow up appointment with care management team member scheduled for:  3 MONTHS  Signature Regina Eck, PharmD, BCPS Clinical Pharmacist, [place]  [place] Phone [phone]   Please call the care guide team at [phone] if you need to cancel or reschedule your appointment.   The patient verbalized understanding of instructions, educational materials, and care plan provided today and DECLINED offer to receive copy of patient instructions, educational materials, and care plan.

## 2022-01-17 DIAGNOSIS — E1169 Type 2 diabetes mellitus with other specified complication: Secondary | ICD-10-CM

## 2022-01-17 DIAGNOSIS — Z7984 Long term (current) use of oral hypoglycemic drugs: Secondary | ICD-10-CM | POA: Diagnosis not present

## 2022-01-17 DIAGNOSIS — F17210 Nicotine dependence, cigarettes, uncomplicated: Secondary | ICD-10-CM | POA: Diagnosis not present

## 2022-01-17 DIAGNOSIS — E785 Hyperlipidemia, unspecified: Secondary | ICD-10-CM | POA: Diagnosis not present

## 2022-01-17 DIAGNOSIS — J449 Chronic obstructive pulmonary disease, unspecified: Secondary | ICD-10-CM | POA: Diagnosis not present

## 2022-01-28 ENCOUNTER — Telehealth: Payer: Self-pay | Admitting: Family Medicine

## 2022-02-05 ENCOUNTER — Telehealth: Payer: Medicare Other | Admitting: Pharmacist

## 2022-02-09 ENCOUNTER — Telehealth: Payer: Self-pay

## 2022-02-09 NOTE — Telephone Encounter (Signed)
Received notification from [place]ME regarding approval for BREZTRI 160. Patient assistance approved from 01/12/22 to 04/19/22.   Received notification from [employer]ME regarding RE-ENROLLMENT approval for BREZTRI 160. Patient assistance approved from 04/20/22 to 04/20/23.  Phone: [phone]

## 2022-02-19 DIAGNOSIS — Z961 Presence of intraocular lens: Secondary | ICD-10-CM | POA: Diagnosis not present

## 2022-02-19 DIAGNOSIS — H26493 Other secondary cataract, bilateral: Secondary | ICD-10-CM | POA: Diagnosis not present

## 2022-02-19 DIAGNOSIS — E119 Type 2 diabetes mellitus without complications: Secondary | ICD-10-CM | POA: Diagnosis not present

## 2022-02-19 LAB — HM DIABETES EYE EXAM

## 2022-02-26 NOTE — Telephone Encounter (Signed)
Disregard portion of previous note - patient eligible for 2024 re-enrollment.

## 2022-03-04 ENCOUNTER — Other Ambulatory Visit: Payer: Self-pay | Admitting: Family Medicine

## 2022-03-04 DIAGNOSIS — M1611 Unilateral primary osteoarthritis, right hip: Secondary | ICD-10-CM

## 2022-03-19 ENCOUNTER — Ambulatory Visit: Payer: Medicare Other

## 2022-04-02 DIAGNOSIS — G4733 Obstructive sleep apnea (adult) (pediatric): Secondary | ICD-10-CM | POA: Diagnosis not present

## 2022-05-24 ENCOUNTER — Other Ambulatory Visit: Payer: Self-pay | Admitting: Family Medicine

## 2022-05-24 DIAGNOSIS — E1151 Type 2 diabetes mellitus with diabetic peripheral angiopathy without gangrene: Secondary | ICD-10-CM

## 2022-05-27 ENCOUNTER — Other Ambulatory Visit: Payer: Self-pay | Admitting: Family Medicine

## 2022-05-27 DIAGNOSIS — E1159 Type 2 diabetes mellitus with other circulatory complications: Secondary | ICD-10-CM

## 2022-06-18 ENCOUNTER — Encounter: Payer: Self-pay | Admitting: Radiology

## 2022-06-24 IMAGING — MR MR HIP*R* W/O CM
4 of 5 series · 28 of 40 positions shown · non-contrast
Comparison: Hip radiograph 07/24/2020

CLINICAL DATA: Hip pain, chronic, osteoarthritis suspected

EXAM:
MR OF THE RIGHT HIP WITHOUT CONTRAST
TECHNIQUE: Multiplanar, multisequence MR imaging was performed. No intravenous
contrast was administered.

[Series 8: T1 · coronal · right · 4.0mm · 0.86mm/px · 6 of 30 slices shown]
[im 1/30]
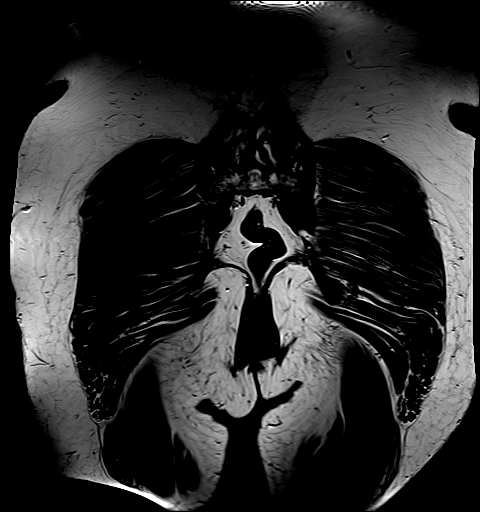
[im 4/30]
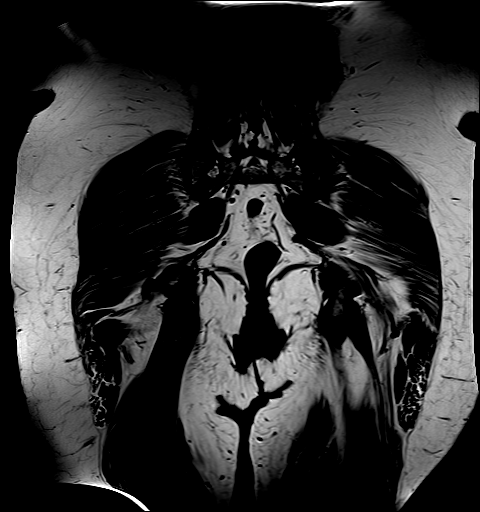
[im 8/30]
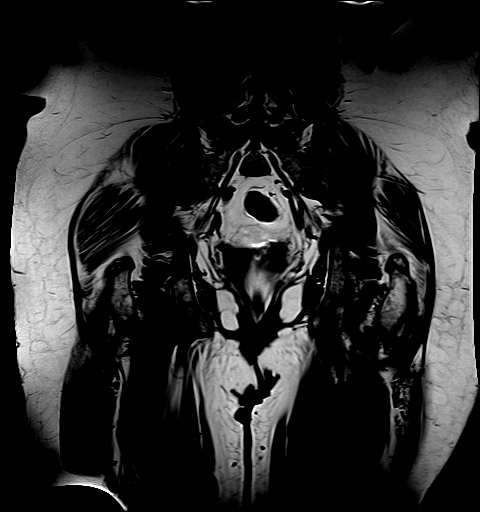
[im 11/30]
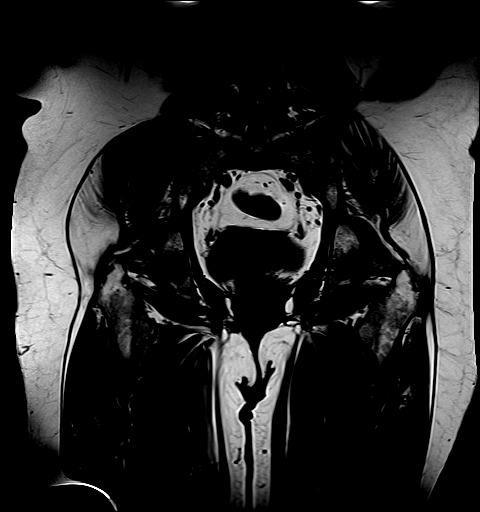
[im 15/30]
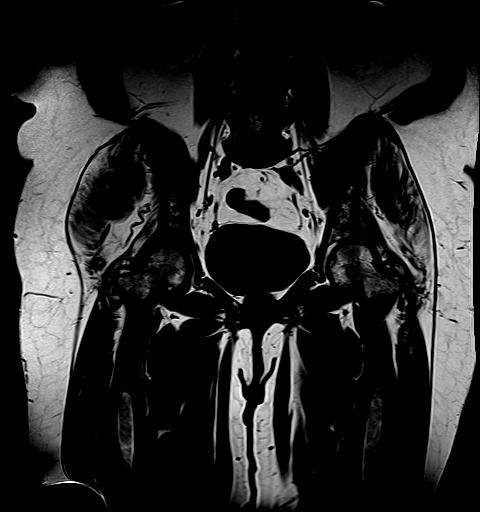
[im 26/30]
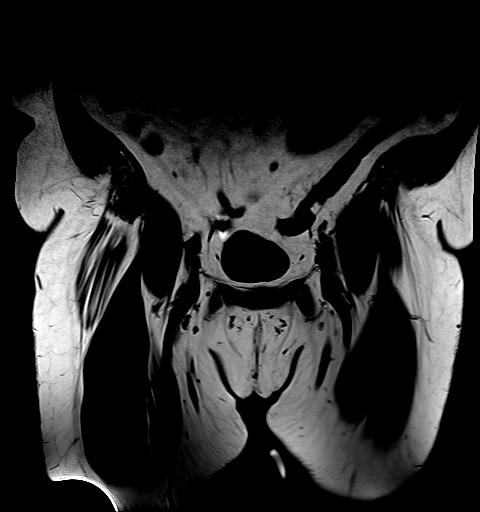

[Series 11: PD fat-sat · sagittal · right · 4.0mm · 0.70mm/px · 7 of 25 slices shown (1 of 2)]
[im 1/25]
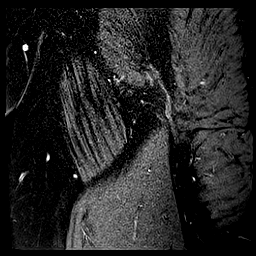
[im 5/25]
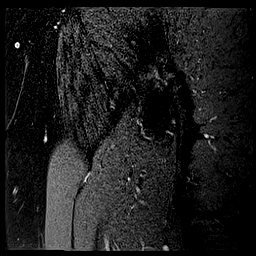
[im 9/25]
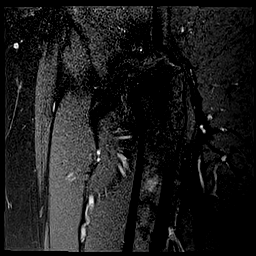
[im 13/25]
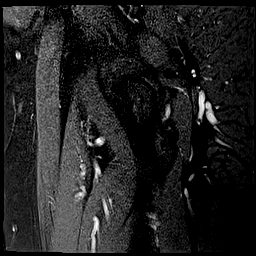
[im 17/25]
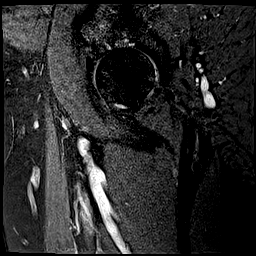
[im 21/25]
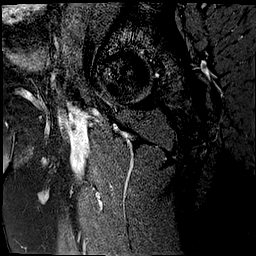
[im 25/25]
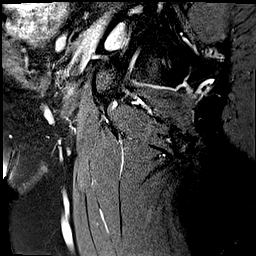

[Series 12: PD fat-sat · coronal · right · 4.0mm · 0.70mm/px · 6 of 20 slices shown (2 of 2)]
[im 1/20]
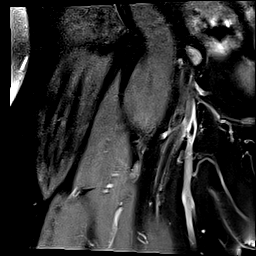
[im 4/20]
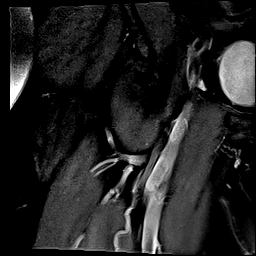
[im 8/20]
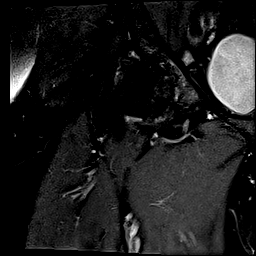
[im 12/20]
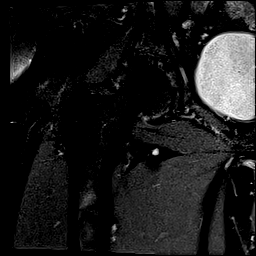
[im 16/20]
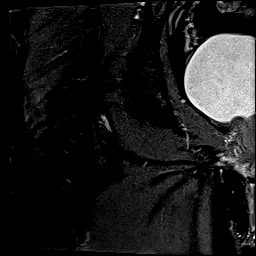
[im 20/20]
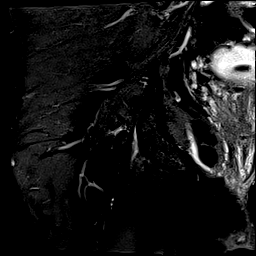

[Series 13: T2 fat-sat · axial · right · 4.0mm · 0.35mm/px · z∈[+3,+148]mm · 9 of 30 slices shown]
[im 1/30]
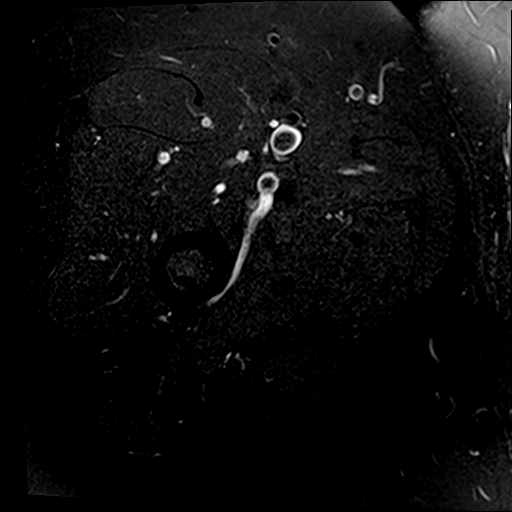
[im 4/30]
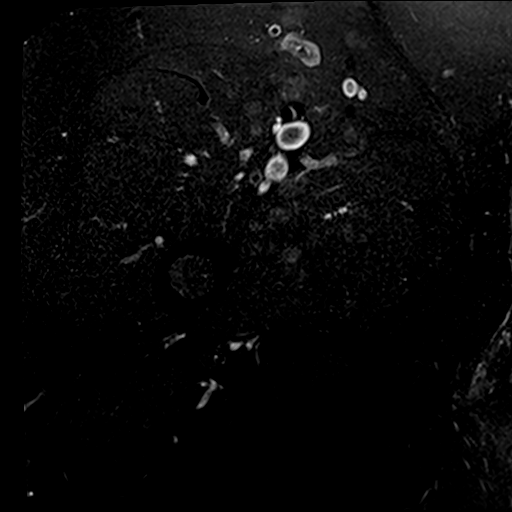
[im 8/30]
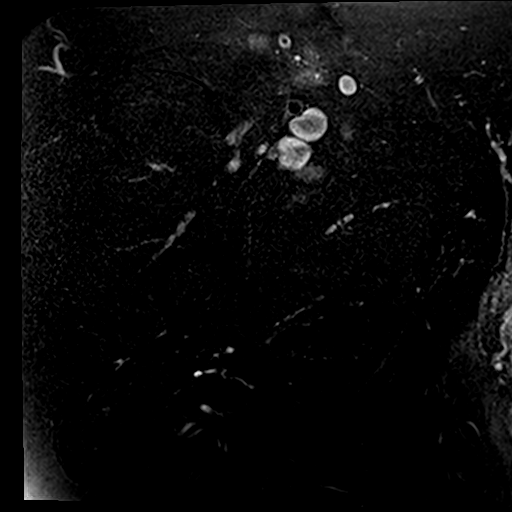
[im 11/30]
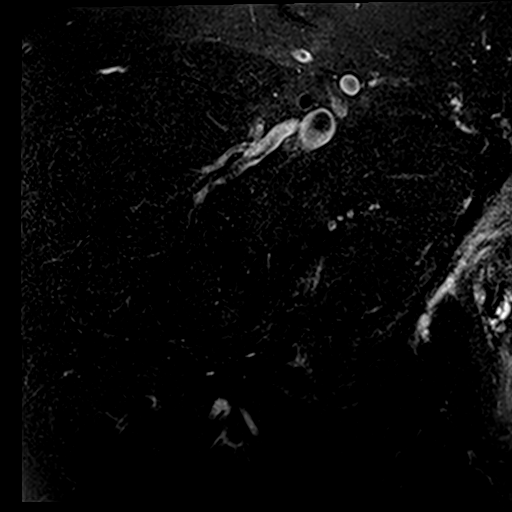
[im 15/30]
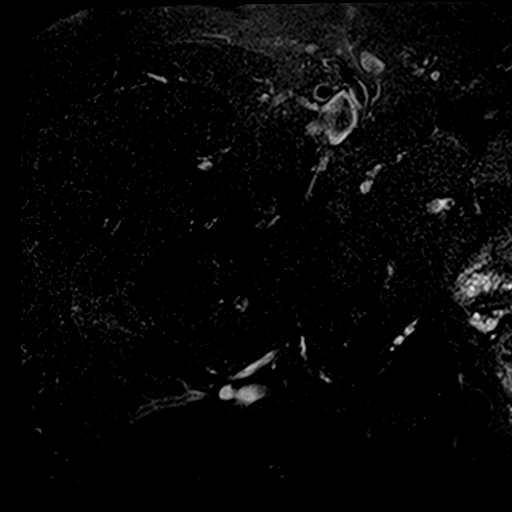
[im 19/30]
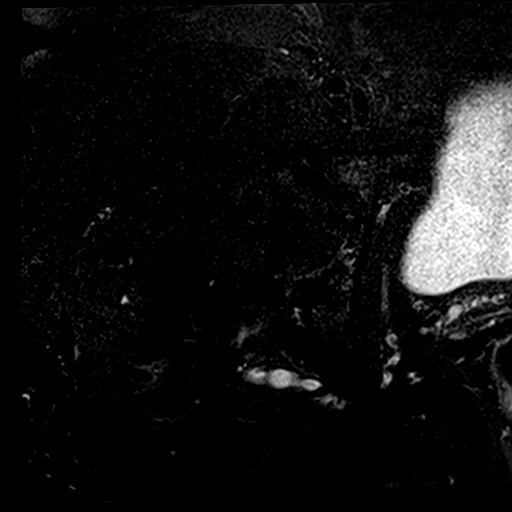
[im 22/30]
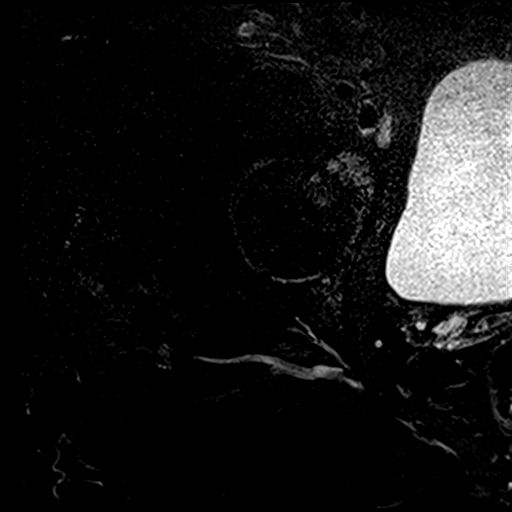
[im 26/30]
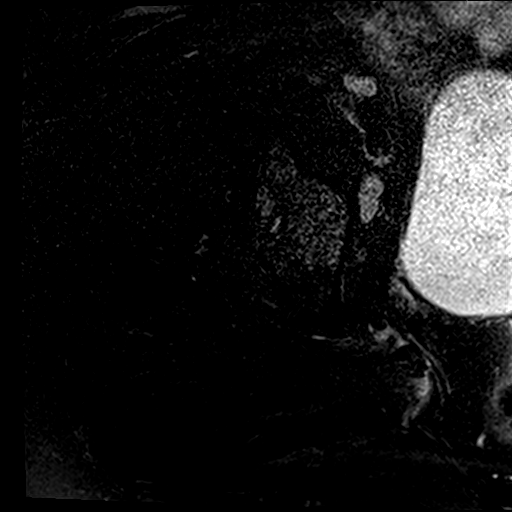
[im 30/30]
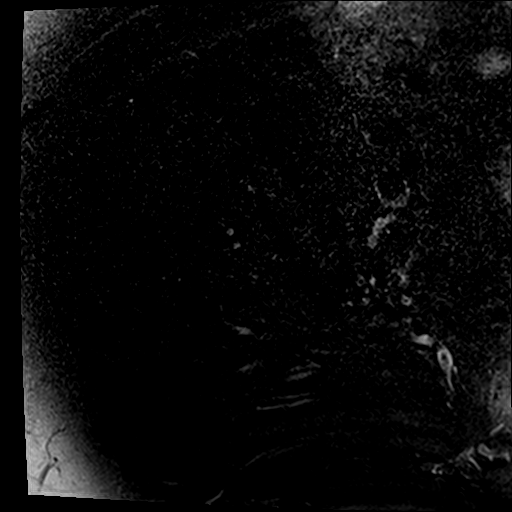

[28 of 40 positions shown; findings below may reference images not displayed]

FINDINGS: Bones: No acute fracture or dislocation. Osteophyte formation,
subchondral sclerosis, and cystic change along the hip joint.

Articular cartilage and labrum

Articular cartilage:  Severe femoroacetabular chondrosis

Labrum:  Degenerative superior labral tearing.

Joint or bursal effusion

Joint effusion:  No significant joint effusion.

Bursae: No significant bursal fluid.

Muscles and tendons

Muscles and tendons: There is a gluteal muscle atrophy bilaterally
with distal tendon attenuation primarily of the minimus tendon,
likely chronic tearing. There is focal edema signal at the left
proximal hamstrings insertion.

Other findings

Miscellaneous: There is lower lumbar spine degenerative disc disease
and facet arthritis.
IMPRESSION: Severe right hip osteoarthritis with degenerative superior labral
tearing.

Incidentally noted edema signal at the left proximal hamstring
insertion compatible with partial tearing, correlate for presence of
left ischial pain.

## 2022-07-10 ENCOUNTER — Other Ambulatory Visit: Payer: Self-pay | Admitting: Family Medicine

## 2022-07-10 DIAGNOSIS — M1611 Unilateral primary osteoarthritis, right hip: Secondary | ICD-10-CM

## 2022-07-18 ENCOUNTER — Other Ambulatory Visit: Payer: Self-pay | Admitting: Family Medicine

## 2022-07-18 DIAGNOSIS — E1169 Type 2 diabetes mellitus with other specified complication: Secondary | ICD-10-CM

## 2022-07-22 ENCOUNTER — Other Ambulatory Visit: Payer: Self-pay | Admitting: Family Medicine

## 2022-07-22 DIAGNOSIS — E1169 Type 2 diabetes mellitus with other specified complication: Secondary | ICD-10-CM

## 2022-07-22 NOTE — Telephone Encounter (Signed)
Pt has apt scheduled for August. Pt says that her bottle says no more refills. Please call back

## 2022-07-24 NOTE — Telephone Encounter (Signed)
Pt made an appt to see PCP on 6/10 for med refill. That is currently PCP first available appt slot. Needs refills sent in to last her until her June appt.

## 2022-07-27 MED ORDER — ATORVASTATIN CALCIUM 40 MG PO TABS: 40.0000 mg | ORAL_TABLET | Freq: Every day | ORAL | 0 refills | Status: DC

## 2022-07-27 NOTE — Addendum Note (Signed)
Addended by: Julious Payer D on: 07/27/2022 02:43 PM   Modules accepted: Orders

## 2022-07-27 NOTE — Telephone Encounter (Signed)
Refill to get pt to apt done in most recent RF request.

## 2022-08-05 ENCOUNTER — Telehealth: Payer: Self-pay | Admitting: Family Medicine

## 2022-08-05 NOTE — Telephone Encounter (Signed)
Contacted Sherryll Burger Nealis to schedule their annual wellness visit. Appointment made for 08/06/2022.   Thank you,  Judeth Cornfield,  AMB Clinical Support [facility] AWV Program Direct Dial ??[phone]

## 2022-08-06 ENCOUNTER — Ambulatory Visit (INDEPENDENT_AMBULATORY_CARE_PROVIDER_SITE_OTHER): Payer: Medicare Other

## 2022-08-06 VITALS — Ht 65.0 in | Wt 220.0 lb

## 2022-08-06 DIAGNOSIS — Z0000 Encounter for general adult medical examination without abnormal findings: Secondary | ICD-10-CM

## 2022-08-06 NOTE — Patient Instructions (Signed)
Jennifer [name] , Thank you for taking time to come for your Medicare Wellness Visit. I appreciate your ongoing commitment to your health goals. Please review the following plan we discussed and let me know if I can assist you in the future.   These are the goals we discussed:  Goals       DIET - INCREASE WATER INTAKE      Prevent falls      Due to knee pain patient is very cautious and goal is to prevent any falls      T2DM, COPD PHARMD GOAL (pt-stated)      Current Barriers:  Unable to independently afford treatment regimen Suboptimal therapeutic regimen for COPD, T2DM  Pharmacist Clinical Goal(s):  patient will verbalize ability to afford treatment regimen maintain control of T2DM, COPD as evidenced by IMPROVED QUALITY OF LIFE, IMPROVED BREATHING, GOAL A1C <7%  adhere to plan to optimize therapeutic regimen for T2DM, COPD as evidenced by report of adherence to recommended medication management changes through collaboration with PharmD and provider.   Interventions: 1:1 collaboration with Raliegh Ip, DO regarding development and update of comprehensive plan of care as evidenced by provider attestation and co-signature Inter-disciplinary care team collaboration (see longitudinal plan of care) Comprehensive medication review performed; medication list updated in electronic medical record  Diabetes: New goal. Controlled; current treatment: METFORMIN (monotherapy) A1c 6.5% GFR 84% Consider SGLT2 VS GLP1 for cardiac protection, weight loss (GLP1), additional benefits beyond what metformin can offer Cost is an issue  (coverage gap), however we can apply for additional patient assistance Current glucose readings: fasting glucose: <135-140, post prandial glucose: n/a Denies hypoglycemic/hyperglycemic symptoms Discussed meal planning options and Plate method for healthy eating Avoid sugary drinks and desserts Incorporate balanced protein, non starchy veggies, 1 serving of  carbohydrate with each meal Increase water intake Increase physical activity as able Current exercise: n/a; encouraged as physically able and as directed by your physician  Educated on T2DM, diet, medications Recommended starting SGTL2 VS GLP1 in the near future  Chronic Obstructive Pulmonary Disease:  New goal. Uncontrolled; current treatment: Breztri (2 puffs twice daily, rinse mouth after each use), albuterol rescue;  Suspected COPD  PCP suspects she has an underlying COPD given ongoing tobacco use and dyspnea Mild wheezes on exhalation/exam by PCP today.  Continue Breztri Additional samples provided Patient able to use teach back and using inhaler appropriately Most recent Pulmonary Function Testing:  n/a 0 exacerbations requiring treatment in the last 6 months  Recommended continue Breztri (ease of patient assistance) Collaborated with PCP on drug therapy and patient assistance Assessed patient finances. [place] Breztri (triple therapy inhaler)--wills hip to patient's home via [facility] mail order pharmacy)   Patient Goals/Self-Care Activities patient will:  - take medications as prescribed as evidenced by patient report and record review check glucose DAILY FASTING OR IF SYMPTOMATIC, document, and provide at future appointments collaborate with provider on medication access solutions target a minimum of 150 minutes of moderate intensity exercise weekly engage in dietary modifications by FOLLOWING A HEART HEALTHY DIET/HEALTHY PLATE METHOD  Barriers:  Unable to independently afford treatment regimen Suboptimal therapeutic regimen for COPD, T2DM  Pharmacist Clinical Goal(s):  patient will verbalize ability to afford treatment regimen maintain control of T2DM, COPD as evidenced by IMPROVED QUALITY OF LIFE, IMPROVED BREATHING, GOAL A1C <7%  adhere to plan to optimize therapeutic regimen for T2DM, COPD as evidenced by report of  adherence to recommended medication management changes through  collaboration with PharmD and provider.   Interventions: 1:1 collaboration with Raliegh Ip, DO regarding development and update of comprehensive plan of care as evidenced by provider attestation and co-signature Inter-disciplinary care team collaboration (see longitudinal plan of care) Comprehensive medication review performed; medication list updated in electronic medical record  Diabetes: New goal. Controlled; current treatment: METFORMIN (monotherapy) A1c 6.5% GFR 84% Consider SGLT2 VS GLP1 for cardiac protection, weight loss (GLP1), additional benefits beyond what metformin can offer Cost is an issue  (coverage gap), however we can apply for additional patient assistance Current glucose readings: fasting glucose: <135-140, post prandial glucose: n/a Denies hypoglycemic/hyperglycemic symptoms Discussed meal planning options and Plate method for healthy eating Avoid sugary drinks and desserts Incorporate balanced protein, non starchy veggies, 1 serving of carbohydrate with each meal Increase water intake Increase physical activity as able Current exercise: n/a; encouraged as physically able and as directed by your physician  Educated on T2DM, diet, medications Recommended starting SGTL2 VS GLP1 in the near future  Chronic Obstructive Pulmonary Disease:  New goal. Uncontrolled; current treatment: Breztri samples, albuterol rescue;  GOLD Classification:  MMRC/CAT score:  Most recent Pulmonary Function Testing:  n/a 0 exacerbations requiring treatment in the last 6 months  Recommended continue Breztri (ease of patient assistance) Collaborated with PCP on drug therapy and patient assistance Assessed patient finances. [place] for Breztri (triple therapy inhaler)--wills hip to patient's home via [facility] mail order pharmacy)   Patient Goals/Self-Care Activities patient  will:  - take medications as prescribed as evidenced by patient report and record review check glucose DAILY FASTING OR IF SYMPTOMATIC, document, and provide at future appointments collaborate with provider on medication access solutions target a minimum of 150 minutes of moderate intensity exercise weekly engage in dietary modifications by FOLLOWING A HEART HEALTHY DIET/HEALTHY PLATE METHOD          This is a list of the screening recommended for you and due dates:  Health Maintenance  Topic Date Due   DTaP/Tdap/Td vaccine (1 - Tdap) Never done   Zoster (Shingles) Vaccine (2 of 2) 01/12/2022   COVID-19 Vaccine (7 - 2023-24 season) 03/25/2022   Hemoglobin A1C  05/20/2022   Mammogram  10/17/2022   Yearly kidney function blood test for diabetes  11/18/2022   Yearly kidney health urinalysis for diabetes  11/18/2022   Complete foot exam   11/18/2022   Flu Shot  11/19/2022   Eye exam for diabetics  02/20/2023   Medicare Annual Wellness Visit  08/06/2023   Colon Cancer Screening  11/26/2023   Pneumonia Vaccine  Completed   DEXA scan (bone density measurement)  Completed   Hepatitis C Screening: USPSTF Recommendation to screen - Ages 41-79 yo.  Completed   HPV Vaccine  Aged Out    Advanced directives: Advance directive discussed with you today. I have provided a copy for you to complete at home and have notarized. Once this is complete please bring a copy in to our office so we can scan it into your chart.   Conditions/risks identified: Aim for 30 minutes of exercise or brisk walking, 6-8 glasses of water, and 5 servings of fruits and vegetables each day.   Next appointment: Follow up in one year for your annual wellness visit    Preventive Care 65 Years and Older, Female Preventive care refers to lifestyle choices and visits with your health care provider that can promote health and wellness. What does preventive care include? A yearly physical  exam. This is also called an annual  well check. Dental exams once or twice a year. Routine eye exams. Ask your health care provider how often you should have your eyes checked. Personal lifestyle choices, including: Daily care of your teeth and gums. Regular physical activity. Eating a healthy diet. Avoiding tobacco and drug use. Limiting alcohol use. Practicing safe sex. Taking low-dose aspirin every day. Taking vitamin and mineral supplements as recommended by your health care provider. What happens during an annual well check? The services and screenings done by your health care provider during your annual well check will depend on your age, overall health, lifestyle risk factors, and family history of disease. Counseling  Your health care provider may ask you questions about your: Alcohol use. Tobacco use. Drug use. Emotional well-being. Home and relationship well-being. Sexual activity. Eating habits. History of falls. Memory and ability to understand (cognition). Work and work Astronomer. Reproductive health. Screening  You may have the following tests or measurements: Height, weight, and BMI. Blood pressure. Lipid and cholesterol levels. These may be checked every 5 years, or more frequently if you are over 85 years old. Skin check. Lung cancer screening. You may have this screening every year starting at age 19 if you have a 30-pack-year history of smoking and currently smoke or have quit within the past 15 years. Fecal occult blood test (FOBT) of the stool. You may have this test every year starting at age 7. Flexible sigmoidoscopy or colonoscopy. You may have a sigmoidoscopy every 5 years or a colonoscopy every 10 years starting at age 63. Hepatitis C blood test. Hepatitis B blood test. Sexually transmitted disease (STD) testing. Diabetes screening. This is done by checking your blood sugar (glucose) after you have not eaten for a while (fasting). You may have this done every 1-3 years. Bone density  scan. This is done to screen for osteoporosis. You may have this done starting at age 58. Mammogram. This may be done every 1-2 years. Talk to your health care provider about how often you should have regular mammograms. Talk with your health care provider about your test results, treatment options, and if necessary, the need for more tests. Vaccines  Your health care provider may recommend certain vaccines, such as: Influenza vaccine. This is recommended every year. Tetanus, diphtheria, and acellular pertussis (Tdap, Td) vaccine. You may need a Td booster every 10 years. Zoster vaccine. You may need this after age 27. Pneumococcal 13-valent conjugate (PCV13) vaccine. One dose is recommended after age 40. Pneumococcal polysaccharide (PPSV23) vaccine. One dose is recommended after age 44. Talk to your health care provider about which screenings and vaccines you need and how often you need them. This information is not intended to replace advice given to you by your health care provider. Make sure you discuss any questions you have with your health care provider. Document Released: 05/03/2015 Document Revised: 12/25/2015 Document Reviewed: 02/05/2015 Elsevier Interactive Patient Education  2017 [employer].  Fall Prevention in the Home Falls can cause injuries. They can happen to people of all ages. There are many things you can do to make your home safe and to help prevent falls. What can I do on the outside of my home? Regularly fix the edges of walkways and driveways and fix any cracks. Remove anything that might make you trip as you walk through a door, such as a raised step or threshold. Trim any bushes or trees on the path to your home. Use bright outdoor lighting.  Clear any walking paths of anything that might make someone trip, such as rocks or tools. Regularly check to see if handrails are loose or broken. Make sure that both sides of any steps have handrails. Any raised decks and  porches should have guardrails on the edges. Have any leaves, snow, or ice cleared regularly. Use sand or salt on walking paths during winter. Clean up any spills in your garage right away. This includes oil or grease spills. What can I do in the bathroom? Use night lights. Install grab bars by the toilet and in the tub and shower. Do not use towel bars as grab bars. Use non-skid mats or decals in the tub or shower. If you need to sit down in the shower, use a plastic, non-slip stool. Keep the floor dry. Clean up any water that spills on the floor as soon as it happens. Remove soap buildup in the tub or shower regularly. Attach bath mats securely with double-sided non-slip rug tape. Do not have throw rugs and other things on the floor that can make you trip. What can I do in the bedroom? Use night lights. Make sure that you have a light by your bed that is easy to reach. Do not use any sheets or blankets that are too big for your bed. They should not hang down onto the floor. Have a firm chair that has side arms. You can use this for support while you get dressed. Do not have throw rugs and other things on the floor that can make you trip. What can I do in the kitchen? Clean up any spills right away. Avoid walking on wet floors. Keep items that you use a lot in easy-to-reach places. If you need to reach something above you, use a strong step stool that has a grab bar. Keep electrical cords out of the way. Do not use floor polish or wax that makes floors slippery. If you must use wax, use non-skid floor wax. Do not have throw rugs and other things on the floor that can make you trip. What can I do with my stairs? Do not leave any items on the stairs. Make sure that there are handrails on both sides of the stairs and use them. Fix handrails that are broken or loose. Make sure that handrails are as long as the stairways. Check any carpeting to make sure that it is firmly attached to the  stairs. Fix any carpet that is loose or worn. Avoid having throw rugs at the top or bottom of the stairs. If you do have throw rugs, attach them to the floor with carpet tape. Make sure that you have a light switch at the top of the stairs and the bottom of the stairs. If you do not have them, ask someone to add them for you. What else can I do to help prevent falls? Wear shoes that: Do not have high heels. Have rubber bottoms. Are comfortable and fit you well. Are closed at the toe. Do not wear sandals. If you use a stepladder: Make sure that it is fully opened. Do not climb a closed stepladder. Make sure that both sides of the stepladder are locked into place. Ask someone to hold it for you, if possible. Clearly mark and make sure that you can see: Any grab bars or handrails. First and last steps. Where the edge of each step is. Use tools that help you move around (mobility aids) if they are needed. These include: Canes.  Walkers. Scooters. Crutches. Turn on the lights when you go into a dark area. Replace any light bulbs as soon as they burn out. Set up your furniture so you have a clear path. Avoid moving your furniture around. If any of your floors are uneven, fix them. If there are any pets around you, be aware of where they are. Review your medicines with your doctor. Some medicines can make you feel dizzy. This can increase your chance of falling. Ask your doctor what other things that you can do to help prevent falls. This information is not intended to replace advice given to you by your health care provider. Make sure you discuss any questions you have with your health care provider. Document Released: 01/31/2009 Document Revised: 09/12/2015 Document Reviewed: 05/11/2014 Elsevier Interactive Patient Education  2017 [employer].

## 2022-08-06 NOTE — Progress Notes (Signed)
Subjective:   Jennifer [name] is a 72 y.o. female who presents for Medicare Annual (Subsequent) preventive examination. I connected with  Debie Ashline Aprea on 08/06/22 by a audio enabled telemedicine application and verified that I am speaking with the correct person using two identifiers.  Patient Location: Home  Provider Location: Home Office  I discussed the limitations of evaluation and management by telemedicine. The patient expressed understanding and agreed to proceed.  Review of Systems     Cardiac Risk Factors include: advanced age (>78men, >26 women);hypertension;diabetes mellitus;dyslipidemia     Objective:    Today's Vitals   08/06/22 1232  Weight: 220 lb (99.8 kg)  Height:  (1.651 m)   Body mass index is 36.61 kg/m.     08/06/2022   12:35 PM 07/29/2021    3:53 PM 12/17/2020   11:46 PM 12/06/2020    2:38 PM 07/26/2020    2:44 PM 04/06/2019    9:03 AM 11/25/2016    7:29 AM  Advanced Directives  Does Patient Have a Medical Advance Directive? No No No No No No No  Would patient like information on creating a medical advance directive? No - Patient declined No - Patient declined No - Patient declined No - Patient declined Yes (MAU/Ambulatory/Procedural Areas - Information given) Yes (MAU/Ambulatory/Procedural Areas - Information given) No - Patient declined    Current Medications (verified) Outpatient Encounter Medications as of 08/06/2022  Medication Sig   acetaminophen (TYLENOL) 500 MG tablet Take 1,000 mg by mouth 2 (two) times daily as needed for mild pain or moderate pain.    atenolol (TENORMIN) 25 MG tablet Take 1 tablet (25 mg total) by mouth daily.   atorvastatin (LIPITOR) 40 MG tablet Take 1 tablet (40 mg total) by mouth at bedtime.   Budeson-Glycopyrrol-Formoterol (BREZTRI AEROSPHERE) 160-9-4.8 MCG/ACT AERO Inhale 2 puffs into the lungs 2 (two) times daily. Rinse mouth out after   calcium carbonate (TUMS - DOSED IN MG ELEMENTAL CALCIUM) 500 MG chewable  tablet Chew 1,000 mg by mouth daily as needed for indigestion or heartburn.   Coenzyme Q-10 100 MG capsule Take 2 capsules (200 mg total) by mouth daily. (Patient taking differently: Take 100 mg by mouth daily.)   fenofibrate (TRICOR) 48 MG tablet Take 1 tablet (48 mg total) by mouth daily. For triglycerides   ibuprofen (ADVIL) 600 MG tablet TAKE 1 TABLET BY MOUTH EVERY 8 HOURS AS NEEDED FOR MODERATE PAIN , USE SPARINGLY   magnesium hydroxide (MILK OF MAGNESIA) 400 MG/5ML suspension Take 15 mLs by mouth daily as needed for mild constipation.   metFORMIN (GLUCOPHAGE) 500 MG tablet Take 1 tablet by mouth once daily with breakfast   Multiple Vitamins-Minerals (ADULT GUMMY PO) Take 1 capsule by mouth daily.   triamterene-hydrochlorothiazide (MAXZIDE-25) 37.5-25 MG tablet Take 1 tablet by mouth once daily   No facility-administered encounter medications on file as of 08/06/2022.    Allergies (verified) Oxycodone and Hydrocodone   History: Past Medical History:  Diagnosis Date   Arthritis    Dyspnea    Hyperlipidemia    Hypertension    Pre-diabetes    Sleep apnea    Past Surgical History:  Procedure Laterality Date   ABDOMINAL HYSTERECTOMY     COLONOSCOPY N/A 11/25/2016   Procedure: COLONOSCOPY;  Surgeon: Malissa Hippo, MD;  Location: [facility];  Service: Endoscopy;  Laterality: N/A;  830   EYE SURGERY     FOOT SURGERY Left 2018   reconstructed tendon  KNEE ARTHROSCOPY     BIL   2010    LUNG BIOPSY     RIGHT 30 YRS AGO    TOTAL HIP ARTHROPLASTY Right 12/17/2020   Procedure: RIGHT TOTAL HIP ARTHROPLASTY ANTERIOR APPROACH;  Surgeon: Kathryne Hitch, MD;  Location: [facility] OR;  Service: Orthopedics;  Laterality: Right;   TOTAL KNEE ARTHROPLASTY Right 02/12/2014   Procedure: TOTAL KNEE ARTHROPLASTY;  Surgeon: Dannielle Huh, MD;  Location: [facility] OR;  Service: Orthopedics;  Laterality: Right;   Family History  Problem Relation Age of Onset   Diabetes Mother    Stroke Mother     Alcohol abuse Father    Stroke Father    Colon cancer Neg Hx    Social History   Socioeconomic History   Marital status: Married    Spouse name: [name]   Number of children: 3   Years of education: 12   Highest education level: Not on file  Occupational History   Occupation: CNa    Employer: [employer]  Tobacco Use   Smoking status: Every Day    Packs/day: 0.50    Years: 35.00    Additional pack years: 0.00    Total pack years: 17.50    Types: Cigarettes   Smokeless tobacco: Never  Vaping Use   Vaping Use: Never used  Substance and Sexual Activity   Alcohol use: No   Drug use: No   Sexual activity: Yes  Other Topics Concern   Not on file  Social History Narrative   She lives with her husband   One son passed away from sepsis in 08-29-2019   One son lives with her now   Social Determinants of Health   Financial Resource Strain: Low Risk  (08/06/2022)   Overall Financial Resource Strain (CARDIA)    Difficulty of Paying Living Expenses: Not hard at all  Food Insecurity: No Food Insecurity (08/06/2022)   Hunger Vital Sign    Worried About Running Out of Food in the Last Year: Never true    Ran Out of Food in the Last Year: Never true  Transportation Needs: No Transportation Needs (08/06/2022)   PRAPARE - Administrator, Civil Service (Medical): No    Lack of Transportation (Non-Medical): No  Physical Activity: Inactive (08/20/2021)   Exercise Vital Sign    Days of Exercise per Week: 0 days    Minutes of Exercise per Session: 0 min  Stress: No Stress Concern Present (08/06/2022)   [employer] of Occupational Health - Occupational Stress Questionnaire    Feeling of Stress : Not at all  Social Connections: Moderately Isolated (08/06/2022)   Social Connection and Isolation Panel [NHANES]    Frequency of Communication with Friends and Family: More than three times a week    Frequency of Social Gatherings with Friends and Family: More than three times a week     Attends Religious Services: Never    Database administrator or Organizations: No    Attends Engineer, structural: Never    Marital Status: Married    Tobacco Counseling Ready to quit: Not Answered Counseling given: Not Answered   Clinical Intake:  Pre-visit preparation completed: Yes  Pain : No/denies pain     Nutritional Risks: None Diabetes: Yes CBG done?: No Did pt. bring in CBG monitor from home?: No  How often do you need to have someone help you when you read instructions, pamphlets, or other written materials from your doctor or pharmacy?: 1 -  Never  Diabetic?yes  Nutrition Risk Assessment:  Has the patient had any N/V/D within the last 2 months?  No  Does the patient have any non-healing wounds?  No  Has the patient had any unintentional weight loss or weight gain?  No   Diabetes:  Is the patient diabetic?  Yes  If diabetic, was a CBG obtained today?  No  Did the patient bring in their glucometer from home?  No  How often do you monitor your CBG's? Once a week .   Financial Strains and Diabetes Management:  Are you having any financial strains with the device, your supplies or your medication? No .  Does the patient want to be seen by Chronic Care Management for management of their diabetes?  No  Would the patient like to be referred to a Nutritionist or for Diabetic Management?  No   Diabetic Exams:  Diabetic Eye Exam: Completed 05/2022 Diabetic Foot Exam: Overdue, Pt has been advised about the importance in completing this exam. Pt is scheduled for diabetic foot exam on next office visit .   Interpreter Needed?: No  Information entered by :: Renie Ora, LPN   Activities of Daily Living    08/06/2022   12:35 PM  In your present state of health, do you have any difficulty performing the following activities:  Hearing? 0  Vision? 0  Difficulty concentrating or making decisions? 0  Walking or climbing stairs? 0  Dressing or bathing? 0   Doing errands, shopping? 0  Preparing Food and eating ? N  Using the Toilet? N  In the past six months, have you accidently leaked urine? N  Do you have problems with loss of bowel control? N  Managing your Medications? N  Managing your Finances? N  Housekeeping or managing your Housekeeping? N    Patient Care Team: Raliegh Ip, DO as PCP - General (Family Medicine) Runell Gess, MD as Consulting Physician (Cardiology) Smitty Cords, OD (Optometry) Cresenciano Genre Lilla Shook, [facility] as Pharmacist (Family Medicine)  Indicate any recent Medical Services you may have received from other than [facility] providers in the past year (date may be approximate).     Assessment:   This is a routine wellness examination for [facility].  Hearing/Vision screen Vision Screening - Comments:: Wears rx glasses - up to date with routine eye exams with  Dr.Martin   Dietary issues and exercise activities discussed: Current Exercise Habits: The patient does not participate in regular exercise at present, Exercise limited by: orthopedic condition(s)   Goals Addressed             This Visit's Progress    DIET - INCREASE WATER INTAKE         Depression Screen    08/06/2022   12:34 PM 11/17/2021   11:39 AM 07/29/2021    3:43 PM 05/20/2021    1:18 PM 04/30/2021    6:09 PM 02/28/2021   12:14 PM 01/14/2021    3:58 PM  PHQ 2/9 Scores  PHQ - 2 Score 0 0 2 1 2 2 2   PHQ- 9 Score  0 2 1 7 6 5     Fall Risk    08/06/2022   12:33 PM 11/17/2021   11:39 AM 07/29/2021    3:38 PM 05/20/2021    1:18 PM 09/27/2020    1:07 PM  Fall Risk   Falls in the past year? 0 0 0 0 0  Number falls in past yr: 0  0  Injury with Fall? 0  0    Risk for fall due to : No Fall Risks  Orthopedic patient    Follow up Falls prevention discussed  Falls prevention discussed      FALL RISK PREVENTION PERTAINING TO THE HOME:  Any stairs in or around the home? No  If so, are there any without handrails? No  Home free of loose  throw rugs in walkways, pet beds, electrical cords, etc? Yes  Adequate lighting in your home to reduce risk of falls? Yes   ASSISTIVE DEVICES UTILIZED TO PREVENT FALLS:  Life alert? No  Use of a cane, walker or w/c? No  Grab bars in the bathroom? Yes  Shower chair or bench in shower? No  Elevated toilet seat or a handicapped toilet? No          08/06/2022   12:35 PM 07/29/2021    3:45 PM 04/06/2019    8:44 AM  6CIT Screen  What Year? 0 points 0 points 0 points  What month? 0 points 0 points 0 points  What time? 0 points 0 points 0 points  Count back from 20 0 points 0 points 0 points  Months in reverse 0 points 0 points 0 points  Repeat phrase 0 points 0 points 0 points  Total Score 0 points 0 points 0 points    Immunizations Immunization History  Administered Date(s) Administered   Fluad Quad(high Dose 65+) 02/07/2019, 03/08/2020, 05/20/2021   Influenza-Unspecified 01/13/2018   Moderna Covid-19 Vaccine Bivalent Booster 8yrs & up 06/26/2021   Moderna Sars-Cov-2 Peds vaccine 14yrs thru 66yrs 11/20/2020   Moderna Sars-Covid-2 Vaccination 05/25/2019, 06/22/2019, 03/13/2020   PNEUMOCOCCAL CONJUGATE-20 05/20/2021   Pneumococcal Conjugate-13 12/06/2018   Unspecified SARS-COV-2 Vaccination 01/28/2022   Zoster Recombinat (Shingrix) 11/17/2021    TDAP status: Due, Education has been provided regarding the importance of this vaccine. Advised may receive this vaccine at local pharmacy or Health Dept. Aware to provide a copy of the vaccination record if obtained from local pharmacy or Health Dept. Verbalized acceptance and understanding.  Flu Vaccine status: Up to date  Pneumococcal vaccine status: Up to date  Covid-19 vaccine status: Completed vaccines  Qualifies for Shingles Vaccine? Yes   Zostavax completed No   Shingrix Completed?: No.    Education has been provided regarding the importance of this vaccine. Patient has been advised to call insurance company to determine out  of pocket expense if they have not yet received this vaccine. Advised may also receive vaccine at local pharmacy or Health Dept. Verbalized acceptance and understanding.  Screening Tests Health Maintenance  Topic Date Due   DTaP/Tdap/Td (1 - Tdap) Never done   Zoster Vaccines- Shingrix (2 of 2) 01/12/2022   COVID-19 Vaccine (7 - 2023-24 season) 03/25/2022   HEMOGLOBIN A1C  05/20/2022   MAMMOGRAM  10/17/2022   Diabetic kidney evaluation - eGFR measurement  11/18/2022   Diabetic kidney evaluation - Urine ACR  11/18/2022   FOOT EXAM  11/18/2022   INFLUENZA VACCINE  11/19/2022   OPHTHALMOLOGY EXAM  02/20/2023   Medicare Annual Wellness (AWV)  08/06/2023   COLONOSCOPY (Pts 45-31yrs Insurance coverage will need to be confirmed)  11/26/2023   Pneumonia Vaccine 65+ Years old  Completed   DEXA SCAN  Completed   Hepatitis C Screening  Completed   HPV VACCINES  Aged Out    Health Maintenance  Health Maintenance Due  Topic Date Due   DTaP/Tdap/Td (1 - Tdap) Never done   Zoster Vaccines-  Shingrix (2 of 2) 01/12/2022   COVID-19 Vaccine (7 - 2023-24 season) 03/25/2022   HEMOGLOBIN A1C  05/20/2022    Colorectal cancer screening: Type of screening: Colonoscopy. Completed 11/22/2016. Repeat every 7 years  Mammogram status: Completed 10/16/2021. Repeat every year  Bone Density status: Completed 08/07/2021. Results reflect: Bone density results: OSTEOPENIA. Repeat every 7 years.  Lung Cancer Screening: (Low Dose CT Chest recommended if Age 77-80 years, 30 pack-year currently smoking OR have quit w/in 15years.) does qualify.   Lung Cancer Screening Referral: declined   Additional Screening:  Hepatitis C Screening: does not qualify; Completed 08/16/2017  Vision Screening: Recommended annual ophthalmology exams for early detection of glaucoma and other disorders of the eye. Is the patient up to date with their annual eye exam?  Yes  Who is the provider or what is the name of the office in  which the patient attends annual eye exams? Dr,Martin  If pt is not established with a provider, would they like to be referred to a provider to establish care? No .   Dental Screening: Recommended annual dental exams for proper oral hygiene  Community Resource Referral / Chronic Care Management: CRR required this visit?  No   CCM required this visit?  No      Plan:     I have personally reviewed and noted the following in the patient's chart:   Medical and social history Use of alcohol, tobacco or illicit drugs  Current medications and supplements including opioid prescriptions. Patient is not currently taking opioid prescriptions. Functional ability and status Nutritional status Physical activity Advanced directives List of other physicians Hospitalizations, surgeries, and ER visits in previous 12 months Vitals Screenings to include cognitive, depression, and falls Referrals and appointments  In addition, I have reviewed and discussed with patient certain preventive protocols, quality metrics, and best practice recommendations. A written personalized care plan for preventive services as well as general preventive health recommendations were provided to patient.     Lorrene Reid, LPN   1/61/0960   Nurse Notes: Due Tdap Vaccine /2nd dose Shingrix

## 2022-08-20 ENCOUNTER — Other Ambulatory Visit: Payer: Self-pay | Admitting: Family Medicine

## 2022-08-20 DIAGNOSIS — I152 Hypertension secondary to endocrine disorders: Secondary | ICD-10-CM

## 2022-08-23 DIAGNOSIS — Z0279 Encounter for issue of other medical certificate: Secondary | ICD-10-CM

## 2022-08-28 ENCOUNTER — Other Ambulatory Visit: Payer: Self-pay | Admitting: Family Medicine

## 2022-08-28 DIAGNOSIS — E1159 Type 2 diabetes mellitus with other circulatory complications: Secondary | ICD-10-CM

## 2022-09-15 ENCOUNTER — Other Ambulatory Visit: Payer: Self-pay | Admitting: Family Medicine

## 2022-09-15 DIAGNOSIS — E1159 Type 2 diabetes mellitus with other circulatory complications: Secondary | ICD-10-CM

## 2022-09-21 ENCOUNTER — Other Ambulatory Visit: Payer: Self-pay | Admitting: Family Medicine

## 2022-09-21 DIAGNOSIS — E1169 Type 2 diabetes mellitus with other specified complication: Secondary | ICD-10-CM

## 2022-09-28 ENCOUNTER — Ambulatory Visit (INDEPENDENT_AMBULATORY_CARE_PROVIDER_SITE_OTHER): Payer: Medicare Other | Admitting: Family Medicine

## 2022-09-28 ENCOUNTER — Encounter: Payer: Self-pay | Admitting: Family Medicine

## 2022-09-28 VITALS — BP 104/64 | HR 76 | Temp 98.8°F | Ht 65.0 in | Wt 226.0 lb

## 2022-09-28 DIAGNOSIS — E1151 Type 2 diabetes mellitus with diabetic peripheral angiopathy without gangrene: Secondary | ICD-10-CM

## 2022-09-28 DIAGNOSIS — Z7984 Long term (current) use of oral hypoglycemic drugs: Secondary | ICD-10-CM

## 2022-09-28 DIAGNOSIS — E785 Hyperlipidemia, unspecified: Secondary | ICD-10-CM

## 2022-09-28 DIAGNOSIS — E1159 Type 2 diabetes mellitus with other circulatory complications: Secondary | ICD-10-CM | POA: Diagnosis not present

## 2022-09-28 DIAGNOSIS — E1169 Type 2 diabetes mellitus with other specified complication: Secondary | ICD-10-CM

## 2022-09-28 DIAGNOSIS — I152 Hypertension secondary to endocrine disorders: Secondary | ICD-10-CM

## 2022-09-28 DIAGNOSIS — G4733 Obstructive sleep apnea (adult) (pediatric): Secondary | ICD-10-CM

## 2022-09-28 DIAGNOSIS — Z720 Tobacco use: Secondary | ICD-10-CM

## 2022-09-28 DIAGNOSIS — I739 Peripheral vascular disease, unspecified: Secondary | ICD-10-CM

## 2022-09-28 LAB — BAYER DCA HB A1C WAIVED: HB A1C (BAYER DCA - WAIVED): 6.3 % — ABNORMAL HIGH (ref 4.8–5.6)

## 2022-09-28 MED ORDER — ATORVASTATIN CALCIUM 40 MG PO TABS
40.0000 mg | ORAL_TABLET | Freq: Every day | ORAL | 3 refills | Status: DC
Start: 2022-09-28 — End: 2023-05-25

## 2022-09-28 MED ORDER — METFORMIN HCL 500 MG PO TABS: 500.0000 mg | ORAL_TABLET | Freq: Every day | ORAL | 3 refills | Status: DC

## 2022-09-28 MED ORDER — TRIAMTERENE-HCTZ 37.5-25 MG PO TABS: 1.0000 | ORAL_TABLET | Freq: Every day | ORAL | 3 refills | Status: DC

## 2022-09-28 MED ORDER — ATENOLOL 25 MG PO TABS: 25.0000 mg | ORAL_TABLET | Freq: Every day | ORAL | 3 refills | Status: DC

## 2022-09-28 MED ORDER — FENOFIBRATE 48 MG PO TABS: 48.0000 mg | ORAL_TABLET | Freq: Every day | ORAL | 3 refills | Status: DC

## 2022-09-28 NOTE — Progress Notes (Signed)
Subjective: CC:DM PCP: Raliegh Ip, DO ZOX:WRUEA W Burdi is a 72 y.o. female presenting to clinic today for:  1. Type 2 Diabetes with hypertension, hyperlipidemia w/ PAD:  She reports compliance with all of her medications.  Reports some decreased energy.  She is compliant with her CPAP machine.  She feels like she sleeps good for solid 8 hours.  Occasionally she will skip using it but this is rare.  She reports no chest pain, shortness of breath, dizziness.  No falls.  No visual disturbance.  No polydipsia or polyuria.  Last eye exam: UTD Last foot exam: UTD Last A1c:  Lab Results  Component Value Date   HGBA1C 6.5 (H) 11/17/2021   Nephropathy screen indicated?: UTD Last flu, zoster and/or pneumovax:  Immunization History  Administered Date(s) Administered   Fluad Quad(high Dose 65+) 02/07/2019, 03/08/2020, 05/20/2021   Influenza-Unspecified 01/13/2018   Moderna Covid-19 Vaccine Bivalent Booster 95yrs & up 06/26/2021   Moderna Sars-Cov-2 Peds vaccine 27yrs thru 44yrs 11/20/2020   Moderna Sars-Covid-2 Vaccination 05/25/2019, 06/22/2019, 03/13/2020   PNEUMOCOCCAL CONJUGATE-20 05/20/2021   Pneumococcal Conjugate-13 12/06/2018   Unspecified SARS-COV-2 Vaccination 01/28/2022   Zoster Recombinat (Shingrix) 11/17/2021   ROS: Per HPI  Allergies  Allergen Reactions   Oxycodone Nausea And Vomiting    Pt stated, "It makes me not want to eat"   Hydrocodone Itching   Past Medical History:  Diagnosis Date   Arthritis    Dyspnea    Hyperlipidemia    Hypertension    Pre-diabetes    Sleep apnea     Current Outpatient Medications:    acetaminophen (TYLENOL) 500 MG tablet, Take 1,000 mg by mouth 2 (two) times daily as needed for mild pain or moderate pain. , Disp: , Rfl:    atenolol (TENORMIN) 25 MG tablet, Take 1 tablet by mouth once daily, Disp: 90 tablet, Rfl: 0   atorvastatin (LIPITOR) 40 MG tablet, TAKE 1 TABLET BY MOUTH AT BEDTIME, Disp: 30 tablet, Rfl: 0    Budeson-Glycopyrrol-Formoterol (BREZTRI AEROSPHERE) 160-9-4.8 MCG/ACT AERO, Inhale 2 puffs into the lungs 2 (two) times daily. Rinse mouth out after, Disp: 32.1 g, Rfl: 11   calcium carbonate (TUMS - DOSED IN MG ELEMENTAL CALCIUM) 500 MG chewable tablet, Chew 1,000 mg by mouth daily as needed for indigestion or heartburn., Disp: , Rfl:    Coenzyme Q-10 100 MG capsule, Take 2 capsules (200 mg total) by mouth daily. (Patient taking differently: Take 100 mg by mouth daily.), Disp: 60 capsule, Rfl: 6   fenofibrate (TRICOR) 48 MG tablet, Take 1 tablet (48 mg total) by mouth daily. For triglycerides, Disp: 90 tablet, Rfl: 3   ibuprofen (ADVIL) 600 MG tablet, TAKE 1 TABLET BY MOUTH EVERY 8 HOURS AS NEEDED FOR MODERATE PAIN , USE SPARINGLY, Disp: 270 tablet, Rfl: 2   magnesium hydroxide (MILK OF MAGNESIA) 400 MG/5ML suspension, Take 15 mLs by mouth daily as needed for mild constipation., Disp: , Rfl:    metFORMIN (GLUCOPHAGE) 500 MG tablet, Take 1 tablet by mouth once daily with breakfast, Disp: 90 tablet, Rfl: 1   Multiple Vitamins-Minerals (ADULT GUMMY PO), Take 1 capsule by mouth daily., Disp: , Rfl:    triamterene-hydrochlorothiazide (MAXZIDE-25) 37.5-25 MG tablet, Take 1 tablet by mouth daily. (NEEDS TO BE SEEN BEFORE NEXT REFILL), Disp: 30 tablet, Rfl: 0 Social History   Socioeconomic History   Marital status: Married    Spouse name: [name]   Number of children: 3   Years of education: 76  Highest education level: Not on file  Occupational History   Occupation: CNa    Employer: [employer]  Tobacco Use   Smoking status: Every Day    Packs/day: 0.50    Years: 35.00    Additional pack years: 0.00    Total pack years: 17.50    Types: Cigarettes   Smokeless tobacco: Never  Vaping Use   Vaping Use: Never used  Substance and Sexual Activity   Alcohol use: No   Drug use: No   Sexual activity: Yes  Other Topics Concern   Not on file  Social History Narrative   She lives with her husband    One son passed away from sepsis in 10/11/19   One son lives with her now   Social Determinants of Health   Financial Resource Strain: Low Risk  (08/06/2022)   Overall Financial Resource Strain (CARDIA)    Difficulty of Paying Living Expenses: Not hard at all  Food Insecurity: No Food Insecurity (08/06/2022)   Hunger Vital Sign    Worried About Running Out of Food in the Last Year: Never true    Ran Out of Food in the Last Year: Never true  Transportation Needs: No Transportation Needs (08/06/2022)   PRAPARE - Administrator, Civil Service (Medical): No    Lack of Transportation (Non-Medical): No  Physical Activity: Inactive (08/20/2021)   Exercise Vital Sign    Days of Exercise per Week: 0 days    Minutes of Exercise per Session: 0 min  Stress: No Stress Concern Present (08/06/2022)   [employer] of Occupational Health - Occupational Stress Questionnaire    Feeling of Stress : Not at all  Social Connections: Moderately Isolated (08/06/2022)   Social Connection and Isolation Panel [NHANES]    Frequency of Communication with Friends and Family: More than three times a week    Frequency of Social Gatherings with Friends and Family: More than three times a week    Attends Religious Services: Never    Database administrator or Organizations: No    Attends Banker Meetings: Never    Marital Status: Married  Catering manager Violence: Not At Risk (08/06/2022)   Humiliation, Afraid, Rape, and Kick questionnaire    Fear of Current or Ex-Partner: No    Emotionally Abused: No    Physically Abused: No    Sexually Abused: No   Family History  Problem Relation Age of Onset   Diabetes Mother    Stroke Mother    Alcohol abuse Father    Stroke Father    Colon cancer Neg Hx     Objective: Office vital signs reviewed. BP 104/64   Pulse 76   Temp 98.8 F (37.1 C)   Ht 5\' 5"  (1.651 m)   Wt 226 lb (102.5 kg)   SpO2 96%   BMI 37.61 kg/m   Physical Examination:   General: Awake, alert, well nourished, smells of tobacco. No acute distress HEENT: sclera white, MMM Cardio: regular rate and rhythm, S1S2 heard, no murmurs appreciated Pulm: clear to auscultation bilaterally, no wheezes, rhonchi or rales; normal work of breathing on room air  Assessment/ Plan: 72 y.o. 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 (HCC) - Plan: Bayer DCA Hb A1c Waived, Microalbumin / creatinine urine ratio, CMP14+EGFR, metFORMIN (GLUCOPHAGE) 500 MG tablet  Hypertension associated with diabetes (HCC) - Plan: CMP14+EGFR, atenolol (TENORMIN) 25 MG tablet, triamterene-hydrochlorothiazide (MAXZIDE-25) 37.5-25 MG tablet  Hyperlipidemia associated with  type 2 diabetes mellitus (HCC) - Plan: CMP14+EGFR, LDL Cholesterol, Direct, atorvastatin (LIPITOR) 40 MG tablet, fenofibrate (TRICOR) 48 MG tablet  Morbid obesity (HCC) - Plan: LDL Cholesterol, Direct  Peripheral arterial disease (HCC) - Plan: CMP14+EGFR, CBC  Tobacco use - Plan: CBC  OSA on CPAP  Sugar remains controlled at A1c of 6.3 today.  No medication changes needed.  Check urine microalbumin.  Plan for diabetic foot exam at next visit as she is still up-to-date on this.  Check renal function.  Medications have been renewed  Blood pressure is controlled though borderline on the low side.  She is not exhibiting any dizziness etc. but we discussed potentially discontinuing or cutting back some of her medication if she starts becoming symptomatic.  Continue statin.  Direct LDL collected today given nonfasting status.  She will change her annual physical for 6 months out.  She knows to be fasting for that visit.  Continues to use tobacco.  Check CBC.  Will offer pulmonary scan at her annual physical if she is interested in this  Continue CPAP as directed  No orders of the defined types were placed in this encounter.  No orders of the defined types were placed in this encounter.    Raliegh Ip, DO [facility] [phone]

## 2022-09-29 LAB — CMP14+EGFR
ALT: 18 IU/L (ref 0–32)
AST: 22 IU/L (ref 0–40)
Albumin/Globulin Ratio: 1.6
Albumin: 4.5 g/dL (ref 3.9–4.9)
Alkaline Phosphatase: 103 IU/L (ref 44–121)
BUN/Creatinine Ratio: 25 (ref 12–28)
BUN: 23 mg/dL (ref 8–27)
Bilirubin Total: 0.2 mg/dL (ref 0.0–1.2)
CO2: 21 mmol/L (ref 20–29)
Calcium: 9.8 mg/dL (ref 8.7–10.3)
Chloride: 100 mmol/L (ref 96–106)
Creatinine, Ser: 0.92 mg/dL (ref 0.57–1.00)
Globulin, Total: 2.9 g/dL (ref 1.5–4.5)
Glucose: 103 mg/dL — ABNORMAL HIGH (ref 70–99)
Potassium: 4.1 mmol/L (ref 3.5–5.2)
Sodium: 139 mmol/L (ref 134–144)
Total Protein: 7.4 g/dL (ref 6.0–8.5)
eGFR: 67 mL/min/{1.73_m2} (ref >59)

## 2022-09-29 LAB — CBC
Hematocrit: 42.8 % (ref 34.0–46.6)
Hemoglobin: 14.1 g/dL (ref 11.1–15.9)
MCH: 29.4 pg (ref 26.6–33.0)
MCHC: 32.9 g/dL (ref 31.5–35.7)
MCV: 89 fL (ref 79–97)
Platelets: 280 10*3/uL (ref 150–450)
RBC: 4.8 x10E6/uL (ref 3.77–5.28)
RDW: 13.6 % (ref 11.7–15.4)
WBC: 7.5 10*3/uL (ref 3.4–10.8)

## 2022-09-29 LAB — MICROALBUMIN / CREATININE URINE RATIO
Creatinine, Urine: 107.5 mg/dL
Microalb/Creat Ratio: 9 mg/g creat (ref 0–29)
Microalbumin, Urine: 9.6 ug/mL

## 2022-09-29 LAB — LDL CHOLESTEROL, DIRECT: LDL Direct: 82 mg/dL (ref 0–99)

## 2022-10-16 ENCOUNTER — Ambulatory Visit (INDEPENDENT_AMBULATORY_CARE_PROVIDER_SITE_OTHER): Payer: Medicare Other | Admitting: Family Medicine

## 2022-10-16 ENCOUNTER — Ambulatory Visit (INDEPENDENT_AMBULATORY_CARE_PROVIDER_SITE_OTHER): Payer: Medicare Other

## 2022-10-16 ENCOUNTER — Encounter: Payer: Self-pay | Admitting: Family Medicine

## 2022-10-16 VITALS — BP 114/63 | HR 98 | Temp 98.5°F | Ht 65.0 in | Wt 225.0 lb

## 2022-10-16 DIAGNOSIS — R1032 Left lower quadrant pain: Secondary | ICD-10-CM | POA: Diagnosis not present

## 2022-10-16 DIAGNOSIS — Z96641 Presence of right artificial hip joint: Secondary | ICD-10-CM | POA: Diagnosis not present

## 2022-10-16 DIAGNOSIS — K5900 Constipation, unspecified: Secondary | ICD-10-CM | POA: Diagnosis not present

## 2022-10-16 NOTE — Patient Instructions (Addendum)
Thank you for coming in to clinic today.  1. Your symptoms are consistent with Constipation, likely cause of your General Abdominal Pain / Cramping. 2. Start with Miralax. First dose 68g (4 capfuls) in 32oz water over 1 to 2 hours for clean out. Next day start 17g or 1 capful daily, may adjust dose up or down by half a capful every few days. Recommend to take this medicine daily for next 1-2 weeks, you may need to use it longer if needed. - Goal is to have soft regular bowel movement 1-3x daily, if too runny or diarrhea, then reduce dose of the medicine to every other day.  Improve water intake, hydration will help Also recommend increased vegetables, fruits, fiber intake Can try daily Metamucil or Fiber supplement at pharmacy over the counter  Follow-up if symptoms are not improving with bowel movements, or if pain worsens, develop fevers, nausea, vomiting.  Please schedule a follow-up appointment in 1 month to follow-up Constipation  If you have any other questions or concerns, please feel free to call the clinic to contact me. You may also schedule an earlier appointment if necessary.  However, if your symptoms get significantly worse, please go to the Emergency Department to seek immediate medical attention.   For Diverticulitis OTC analgesics: tylenol and ibuprofen  Liquid diet

## 2022-10-16 NOTE — Progress Notes (Signed)
Acute Office Visit  Subjective:  Patient ID: Jennifer [name], female    DOB: 02-Mar-1951, 72 y.o.   MRN: [id number]  Chief Complaint  Patient presents with   Abdominal Pain    X 3 days    HPI Patient is in today for abdominal pain that started 3 days ago. States that it is in her middle abdomen and lower left quadrant. She has a decreased appetite. She states that she feels she is dependent on milk of magnesia for bowel movements and she was out for a few days. She restarted yesterday and has had a BM today. However, there is more straining with bowel movements. She has cramping intermittently throughout the day. Denies changes to urination. Denies fever. Endorses lightheadedness. Denies N/V. States that she has always been constipated and has used milk of mag for several years.   ROS As per HPI   Objective:  BP 114/63   Pulse 98   Temp 98.5 F (36.9 C)   Ht 5\' 5"  (1.651 m)   Wt 225 lb (102.1 kg)   SpO2 96%   BMI 37.44 kg/m    Physical Exam Constitutional:      General: She is awake. She is not in acute distress.    Appearance: Normal appearance. She is well-developed and well-groomed. She is not ill-appearing, toxic-appearing or diaphoretic.  Cardiovascular:     Rate and Rhythm: Normal rate.     Pulses: Normal pulses.          Radial pulses are 2+ on the right side and 2+ on the left side.       Posterior tibial pulses are 2+ on the right side and 2+ on the left side.     Heart sounds: Normal heart sounds. No murmur heard.    No gallop.  Pulmonary:     Effort: Pulmonary effort is normal. No respiratory distress.     Breath sounds: Normal breath sounds. No stridor. No wheezing, rhonchi or rales.  Abdominal:     General: Bowel sounds are normal.     Palpations: Abdomen is soft.     Tenderness: There is abdominal tenderness in the epigastric area and left lower quadrant.     Hernia: No hernia is present.     Comments: Tearful upon palpation  Musculoskeletal:      Cervical back: Full passive range of motion without pain and neck supple.     Right lower leg: No edema.     Left lower leg: No edema.  Skin:    General: Skin is warm.     Capillary Refill: Capillary refill takes less than 2 seconds.  Neurological:     General: No focal deficit present.     Mental Status: She is alert, oriented to person, place, and time and easily aroused. Mental status is at baseline.     GCS: GCS eye subscore is 4. GCS verbal subscore is 5. GCS motor subscore is 6.     Motor: No weakness.  Psychiatric:        Attention and Perception: Attention and perception normal.        Mood and Affect: Mood and affect normal.        Speech: Speech normal.        Behavior: Behavior normal. Behavior is cooperative.        Thought Content: Thought content normal. Thought content does not include homicidal or suicidal ideation. Thought content does not include homicidal or suicidal plan.  Cognition and Memory: Cognition and memory normal.        Judgment: Judgment normal.       10/16/2022    3:57 PM 09/28/2022    1:21 PM 08/06/2022   12:34 PM  Depression screen PHQ 2/9  Decreased Interest 1 0 0  Down, Depressed, Hopeless 0 0 0  PHQ - 2 Score 1 0 0  Altered sleeping 0 0   Tired, decreased energy 1 0   Change in appetite 1 0   Feeling bad or failure about yourself  0 0   Trouble concentrating 0 0   Moving slowly or fidgety/restless 0 0   Suicidal thoughts 0 0   PHQ-9 Score 3 0   Difficult doing work/chores Not difficult at all Not difficult at all       10/16/2022    3:58 PM 09/28/2022    1:21 PM 11/17/2021   11:39 AM 05/20/2021    1:19 PM  GAD 7 : Generalized Anxiety Score  Nervous, Anxious, on Edge 0 0 0 0  Control/stop worrying 0 0 0 0  Worry too much - different things 0 0 0 0  Trouble relaxing 0 0 0 0  Restless 0 0 0 0  Easily annoyed or irritable 0 0 0 0  Afraid - awful might happen 0 0 0 0  Total GAD 7 Score 0 0 0 0  Anxiety Difficulty Not difficult at  all Not difficult at all Not difficult at all Not difficult at all    Assessment & Plan:  1. Left lower quadrant abdominal pain Imaging and Labs as below. Will communicate results to patient once available and determine next steps.  Discussed differential diagnoses of constipation and diverticulitis with patient. Explained bowel cleanout and bowel rest to patient. Discussed red flag symptoms.  - DG Abd 1 View - CBC with Differential/Platelet - CMP14+EGFR  The above assessment and management plan was discussed with the patient. The patient verbalized understanding of and has agreed to the management plan using shared-decision making. Patient is aware to call the clinic if they develop any new symptoms or if symptoms fail to improve or worsen. Patient is aware when to return to the clinic for a follow-up visit. Patient educated on when it is appropriate to go to the emergency department.   Return if symptoms worsen or fail to improve.  Neale Burly, DNP-FNP [facility] [address] [place], [place] [address] [phone]

## 2022-10-17 LAB — CMP14+EGFR
ALT: 15 IU/L (ref 0–32)
AST: 19 IU/L (ref 0–40)
Albumin: 4.5 g/dL (ref 3.9–4.9)
Alkaline Phosphatase: 89 IU/L (ref 44–121)
BUN/Creatinine Ratio: 18 (ref 12–28)
BUN: 17 mg/dL (ref 8–27)
Bilirubin Total: 0.3 mg/dL (ref 0.0–1.2)
CO2: 22 mmol/L (ref 20–29)
Calcium: 10.2 mg/dL (ref 8.7–10.3)
Chloride: 97 mmol/L (ref 96–106)
Creatinine, Ser: 0.97 mg/dL (ref 0.57–1.00)
Globulin, Total: 2.8 g/dL (ref 1.5–4.5)
Glucose: 122 mg/dL — ABNORMAL HIGH (ref 70–99)
Potassium: 3.7 mmol/L (ref 3.5–5.2)
Sodium: 134 mmol/L (ref 134–144)
Total Protein: 7.3 g/dL (ref 6.0–8.5)
eGFR: 63 mL/min/{1.73_m2} (ref >59)

## 2022-10-17 LAB — CBC WITH DIFFERENTIAL/PLATELET
Basophils Absolute: 0.1 10*3/uL (ref 0.0–0.2)
Basos: 1 %
EOS (ABSOLUTE): 0.1 10*3/uL (ref 0.0–0.4)
Eos: 1 %
Hematocrit: 41.7 % (ref 34.0–46.6)
Hemoglobin: 14 g/dL (ref 11.1–15.9)
Immature Grans (Abs): 0 10*3/uL (ref 0.0–0.1)
Immature Granulocytes: 0 %
Lymphocytes Absolute: 1.9 10*3/uL (ref 0.7–3.1)
Lymphs: 17 %
MCH: 29.3 pg (ref 26.6–33.0)
MCHC: 33.6 g/dL (ref 31.5–35.7)
MCV: 87 fL (ref 79–97)
Monocytes Absolute: 1 10*3/uL — ABNORMAL HIGH (ref 0.1–0.9)
Monocytes: 9 %
Neutrophils Absolute: 7.9 10*3/uL — ABNORMAL HIGH (ref 1.4–7.0)
Neutrophils: 72 %
Platelets: 245 10*3/uL (ref 150–450)
RBC: 4.78 x10E6/uL (ref 3.77–5.28)
RDW: 13.3 % (ref 11.7–15.4)
WBC: 11.1 10*3/uL — ABNORMAL HIGH (ref 3.4–10.8)

## 2022-10-19 ENCOUNTER — Telehealth: Payer: Self-pay | Admitting: Family Medicine

## 2022-10-19 NOTE — Progress Notes (Signed)
Slightly elevated WBC, neutrophils, and monocytes, can be consistent with diverticulitis. If patient is still having symptoms, can follow up with abdominal CT. Glucose consistent with T2DM diagnosis.

## 2022-10-20 DIAGNOSIS — Z1231 Encounter for screening mammogram for malignant neoplasm of breast: Secondary | ICD-10-CM | POA: Diagnosis not present

## 2022-10-20 LAB — HM MAMMOGRAPHY

## 2022-10-21 ENCOUNTER — Telehealth: Payer: Self-pay | Admitting: Orthopaedic Surgery

## 2022-10-21 NOTE — Telephone Encounter (Signed)
Pt wanted Nurse to call she has some question about her hip and previous surgery

## 2022-10-21 NOTE — Telephone Encounter (Signed)
I called and talked to the pt. She is having pain with the right hip and wants to do PT. Since hip was replaced and havent seen her in almost 2 years, I advised we need to see her in the office again. Appt made

## 2022-10-21 NOTE — Telephone Encounter (Signed)
noted 

## 2022-10-21 NOTE — Telephone Encounter (Signed)
Pt returned call to [facility]. Told pt to listen out for a call back from [facility]. Please call pt at [phone].

## 2022-10-21 NOTE — Telephone Encounter (Signed)
Lvm for pt to cb 

## 2022-10-22 IMAGING — DX DG PORTABLE PELVIS
1 series · 1 of 1 positions shown · non-contrast
Comparison: Fluoroscopic images of same day.

CLINICAL DATA: Status post right hip replacement.

EXAM:
PORTABLE PELVIS 1-2 VIEWS

[pelvis ap]
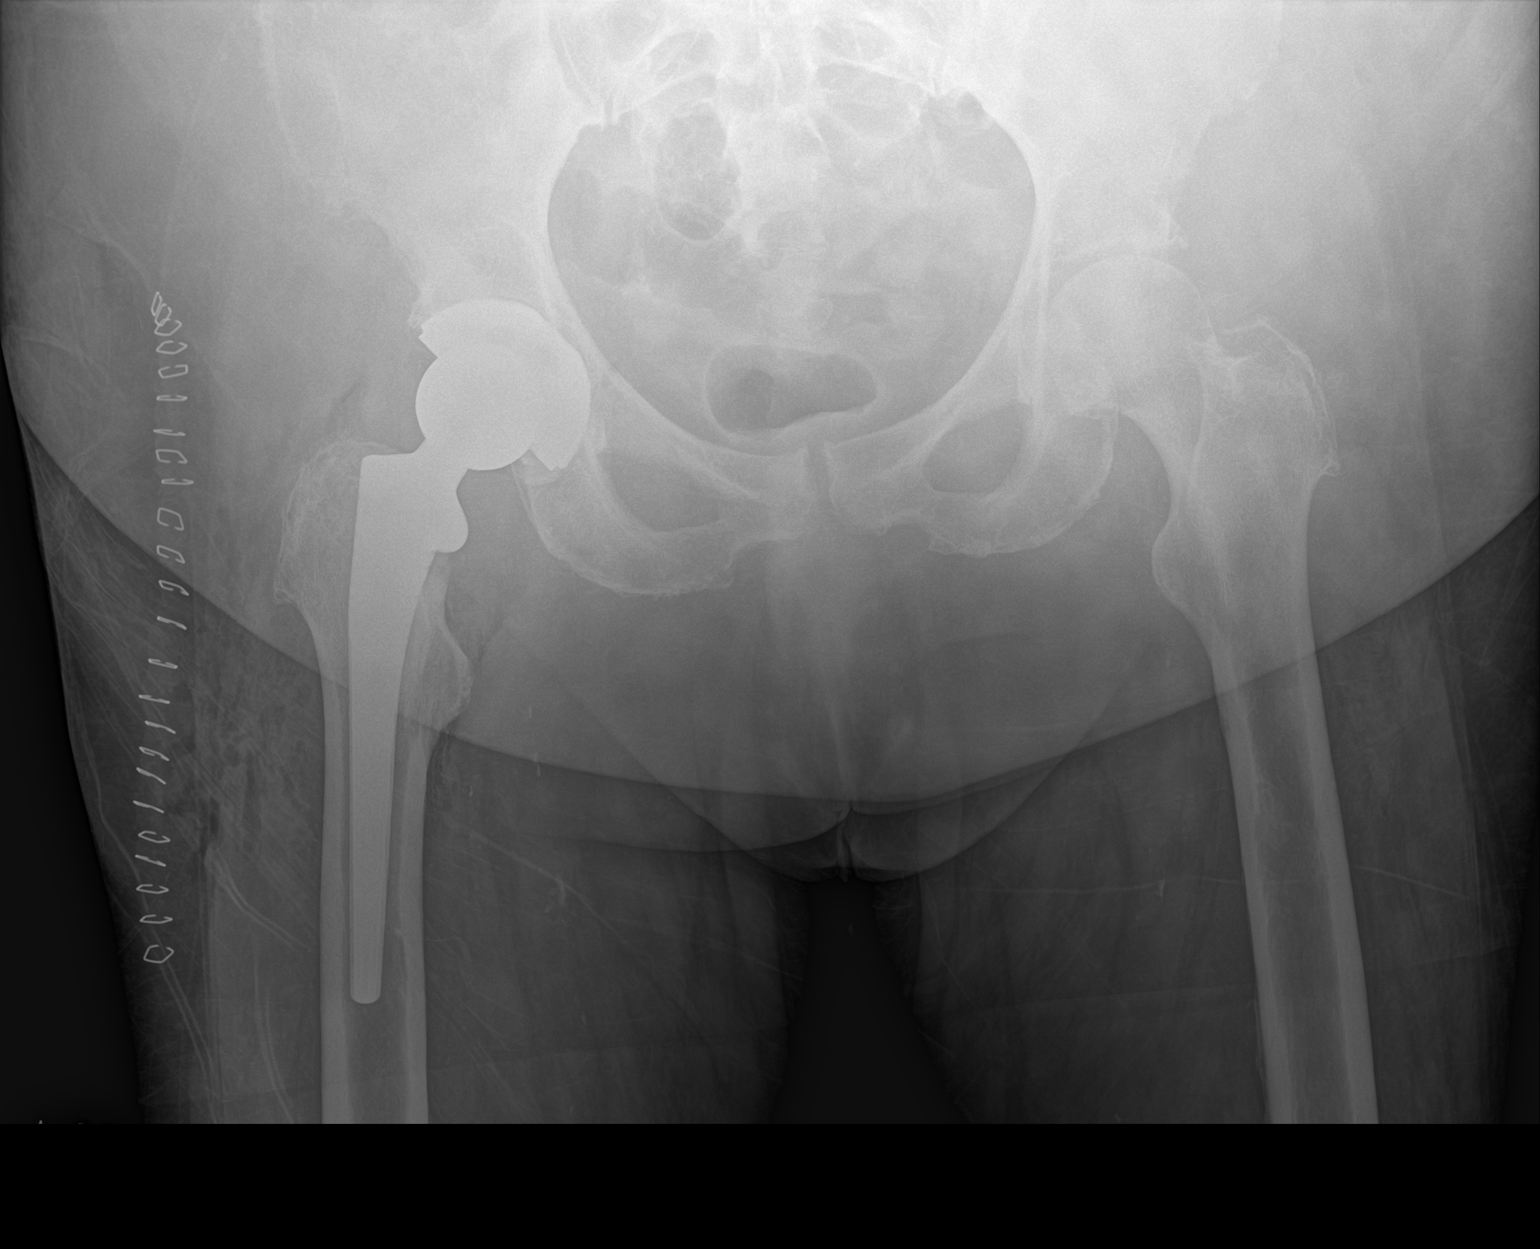

[1 of 1 positions shown; findings below may reference images not displayed]

FINDINGS: Right acetabular and femoral components are well situated. Expected
postoperative changes are seen in the surrounding soft tissues.
IMPRESSION: Status post right total hip arthroplasty.

## 2022-10-23 NOTE — Progress Notes (Signed)
No obstruction. Continue current plan. Follow up if symptoms continue.

## 2022-10-26 ENCOUNTER — Telehealth: Payer: Self-pay | Admitting: Family Medicine

## 2022-10-26 DIAGNOSIS — G4733 Obstructive sleep apnea (adult) (pediatric): Secondary | ICD-10-CM

## 2022-10-26 NOTE — Telephone Encounter (Signed)
Pt needs rx for cpap and demo sheet faxed to [phone] pt cannot remember that name of the supply company. She thinks it may be [employer]

## 2022-10-26 NOTE — Telephone Encounter (Signed)
[facility] is the supply company.

## 2022-10-27 NOTE — Telephone Encounter (Signed)
Given to [facility] for faxing.

## 2022-10-28 NOTE — Telephone Encounter (Signed)
LMOVM order has been faxed to [facility]

## 2022-10-29 ENCOUNTER — Encounter: Payer: Self-pay | Admitting: Family Medicine

## 2022-11-02 ENCOUNTER — Encounter: Payer: Self-pay | Admitting: Physician Assistant

## 2022-11-02 ENCOUNTER — Other Ambulatory Visit (INDEPENDENT_AMBULATORY_CARE_PROVIDER_SITE_OTHER): Payer: Medicare Other

## 2022-11-02 ENCOUNTER — Ambulatory Visit: Payer: Medicare Other | Admitting: Physician Assistant

## 2022-11-02 DIAGNOSIS — M7061 Trochanteric bursitis, right hip: Secondary | ICD-10-CM

## 2022-11-02 DIAGNOSIS — M25562 Pain in left knee: Secondary | ICD-10-CM

## 2022-11-02 MED ORDER — LIDOCAINE HCL 1 % IJ SOLN
3.0000 mL | INTRAMUSCULAR | Status: AC | PRN
Start: 2022-11-02 — End: 2022-11-02
  Administered 2022-11-02: 3 mL

## 2022-11-02 MED ORDER — METHYLPREDNISOLONE ACETATE 40 MG/ML IJ SUSP
40.0000 mg | INTRAMUSCULAR | Status: AC | PRN
Start: 2022-11-02 — End: 2022-11-02
  Administered 2022-11-02: 40 mg via INTRA_ARTICULAR

## 2022-11-02 NOTE — Progress Notes (Signed)
Office Visit Note   Patient: Jennifer [name]           Date of Birth: 1950-09-10           MRN: [id number] Visit Date: 11/02/2022              Requested by: Raliegh Ip, DO [address] [place],  [place] [address] PCP: Raliegh Ip, DO   Assessment & Plan: Visit Diagnoses:  1. Left knee pain, unspecified chronicity   2. Trochanteric bursitis, right hip     Plan: We will send her to physical therapy for IT band stretching , modalities to the right IT band and home exercise program. IT band stretching shown.Understands to wait 3 months between cortisone injections for the knee and hip.  Follow-Up Instructions: Return for follow up in 4 to 6 weeks if pain continues or fails to improve.   Orders:  Orders Placed This Encounter  Procedures   Large Joint Inj: L knee   Large Joint Inj: R greater trochanter   XR HIP UNILAT W OR W/O PELVIS 2-3 VIEWS RIGHT   XR Knee 1-2 Views Left   Ambulatory referral to Physical Therapy   No orders of the defined types were placed in this encounter.     Procedures: Large Joint Inj: L knee on 11/02/2022 9:39 PM Indications: pain Details: 22 G 1.5 in needle, anterolateral approach  Arthrogram: No  Medications: 3 mL lidocaine 1 %; 40 mg methylPREDNISolone acetate 40 MG/ML Outcome: tolerated well, no immediate complications Procedure, treatment alternatives, risks and benefits explained, specific risks discussed. Consent was given by the patient. Immediately prior to procedure a time out was called to verify the correct patient, procedure, equipment, support staff and site/side marked as required. Patient was prepped and draped in the usual sterile fashion.    Large Joint Inj: R greater trochanter on 11/02/2022 9:40 PM Indications: pain Details: 22 G 1.5 in needle, lateral approach  Arthrogram: No  Medications: 3 mL lidocaine 1 %; 40 mg methylPREDNISolone acetate 40 MG/ML Outcome: tolerated well, no immediate  complications Procedure, treatment alternatives, risks and benefits explained, specific risks discussed. Consent was given by the patient. Immediately prior to procedure a time out was called to verify the correct patient, procedure, equipment, support staff and site/side marked as required. Patient was prepped and draped in the usual sterile fashion.       Clinical Data: No additional findings.   Subjective: Chief Complaint  Patient presents with   Right Hip - Pain   Left Knee - Pain    HPI Jennifer [name] comes in today for left knee pain and right hip pain.She is requesting a left knee injection. History of right total hip arthroplasty 12/17/20. No known injury. Pain lateral aspect of hip. No radicular pain. Reports unable to stand for long secondary to right hip pain.  Left knee pain for 2 years. No recent injury . Denies any recent treatment for the left knee pain. Reports she has tried tylenol, ibuprofen , ice and Bio-freeze for the knee and hip pain . She is a diet controlled diabetic. Last HgbA1C was 6.7.   Review of Systems Negative for fevers, chills.   Objective: Vital Signs: There were no vitals taken for this visit.  Physical Exam Constitutional:      Appearance: She is not ill-appearing or diaphoretic.  Pulmonary:     Effort: Pulmonary effort is normal.  Neurological:     Mental Status: She is alert and  oriented to person, place, and time.  Psychiatric:        Mood and Affect: Mood normal.     Ortho Exam Bilateral hips: Good range of motion both hips without pain. Right hip tenderness over trochanteric region. Tenderness over the IT band to mid IT band right hip only. Bilateral knees : Normal range of motion bilateral. Left knee crepitus with passive range of motion. Valgus deformity left knee. Tenderness left knee only over the lateral joint line. No instability of either knee. No abnormal warmth , effusion or erythema either knee. Specialty Comments:  No  specialty comments available.  Imaging: XR HIP UNILAT W OR W/O PELVIS 2-3 VIEWS RIGHT  Result Date: 11/02/2022 Ap pelvis and lateral view right hip: Compared to 12/17/20 pelvis radiograph. No hardware failure. Right total hip arthroplasty components appear well seated. Bilateral hips well located. No acute fractures.   XR Knee 1-2 Views Left  Result Date: 11/02/2022 Left knee 2 views: Valgus deformity. Near bone on bone lateral compartment. Moderate patellar femoral arthritic changes. No acute findings or fracture.     PMFS History: Patient Active Problem List   Diagnosis Date Noted   Moderate recurrent major depression (HCC) 07/29/2021   Unilateral primary osteoarthritis, right hip 12/17/2020   Status post total replacement of right hip 12/17/2020   Hip pain 07/24/2020   OSA on CPAP 04/04/2019   Peripheral arterial disease (HCC) 09/21/2017   Type 2 diabetes, with peripheral circulatory disorder not at goal [facility]) 08/16/2017   Chronic pain of left ankle 08/11/2017   Bilateral carpal tunnel syndrome 08/11/2017   Hypertension associated with diabetes (HCC) 08/11/2017   Morbid obesity (HCC) 08/11/2017   Localized primary osteoarthritis of left lower leg 08/11/2017   Hyperlipidemia associated with type 2 diabetes mellitus (HCC) 08/11/2017   Tobacco use disorder 08/11/2017   History of adenomatous polyp of colon 07/23/2016   S/P total knee arthroplasty 02/12/2014   Past Medical History:  Diagnosis Date   Arthritis    Dyspnea    Hyperlipidemia    Hypertension    Pre-diabetes    Sleep apnea     Family History  Problem Relation Age of Onset   Diabetes Mother    Stroke Mother    Alcohol abuse Father    Stroke Father    Colon cancer Neg Hx     Past Surgical History:  Procedure Laterality Date   ABDOMINAL HYSTERECTOMY     COLONOSCOPY N/A 11/25/2016   Procedure: COLONOSCOPY;  Surgeon: Malissa Hippo, MD;  [facility];  Service: Endoscopy;  Laterality: N/A;  830    EYE SURGERY     FOOT SURGERY Left 2018   reconstructed tendon   KNEE ARTHROSCOPY     BIL   2010    LUNG BIOPSY     RIGHT 30 YRS AGO    TOTAL HIP ARTHROPLASTY Right 12/17/2020   Procedure: RIGHT TOTAL HIP ARTHROPLASTY ANTERIOR APPROACH;  Surgeon: Kathryne Hitch, MD;  Location: [facility];  Service: Orthopedics;  Laterality: Right;   TOTAL KNEE ARTHROPLASTY Right 02/12/2014   Procedure: TOTAL KNEE ARTHROPLASTY;  Surgeon: Dannielle Huh, MD;  Location: [facility] OR;  Service: Orthopedics;  Laterality: Right;   Social History   Occupational History   Occupation: CNa    Employer: [employer]  Tobacco Use   Smoking status: Every Day    Current packs/day: 0.50    Average packs/day: 0.5 packs/day for 35.0 years (17.5 ttl pk-yrs)    Types: Cigarettes  Smokeless tobacco: Never  Vaping Use   Vaping status: Never Used  Substance and Sexual Activity   Alcohol use: No   Drug use: No   Sexual activity: Yes

## 2022-11-20 ENCOUNTER — Encounter: Payer: Medicare Other | Admitting: Family Medicine

## 2022-11-20 DIAGNOSIS — Z96641 Presence of right artificial hip joint: Secondary | ICD-10-CM | POA: Diagnosis not present

## 2022-11-20 DIAGNOSIS — M25562 Pain in left knee: Secondary | ICD-10-CM | POA: Diagnosis not present

## 2022-11-20 DIAGNOSIS — M7061 Trochanteric bursitis, right hip: Secondary | ICD-10-CM | POA: Diagnosis not present

## 2022-11-20 DIAGNOSIS — G4733 Obstructive sleep apnea (adult) (pediatric): Secondary | ICD-10-CM | POA: Diagnosis not present

## 2022-11-20 DIAGNOSIS — M6281 Muscle weakness (generalized): Secondary | ICD-10-CM | POA: Diagnosis not present

## 2022-11-25 ENCOUNTER — Telehealth: Payer: Self-pay | Admitting: Orthopaedic Surgery

## 2022-11-25 NOTE — Telephone Encounter (Signed)
Mattie from [facility] called. Says patient has refused PT. Her cb# is [phone]

## 2022-12-09 ENCOUNTER — Ambulatory Visit: Payer: Medicare Other | Admitting: Family Medicine

## 2023-01-13 ENCOUNTER — Encounter: Payer: Self-pay | Admitting: Podiatry

## 2023-01-13 ENCOUNTER — Ambulatory Visit: Payer: Medicare Other | Admitting: Podiatry

## 2023-01-13 ENCOUNTER — Ambulatory Visit (INDEPENDENT_AMBULATORY_CARE_PROVIDER_SITE_OTHER): Payer: Medicare Other

## 2023-01-13 DIAGNOSIS — M778 Other enthesopathies, not elsewhere classified: Secondary | ICD-10-CM | POA: Diagnosis not present

## 2023-01-13 MED ORDER — MELOXICAM 15 MG PO TABS: 15.0000 mg | ORAL_TABLET | Freq: Every day | ORAL | 1 refills | Status: DC

## 2023-01-13 MED ORDER — BETAMETHASONE SOD PHOS & ACET 6 (3-3) MG/ML IJ SUSP
3.0000 mg | Freq: Once | INTRAMUSCULAR | Status: AC
Start: 2023-01-13 — End: 2023-01-13
  Administered 2023-01-13: 3 mg via INTRA_ARTICULAR

## 2023-01-13 NOTE — Progress Notes (Signed)
   Chief Complaint  Patient presents with   Foot Pain    Patient is here for right foot pain and swelling    HPI: 72 y.o. female presenting today as a reestablish new patient for evaluation of pain and tenderness associated to the dorsum of the right foot.  Onset several months ago.  Denies a history of injury.  She believes that possibly she has arthritis.  It is occasionally tender and painful depending on her activity.  She has not really done anything for treatment other than elevation and ice.  Past Medical History:  Diagnosis Date   Arthritis    Dyspnea    Hyperlipidemia    Hypertension    Pre-diabetes    Sleep apnea     Past Surgical History:  Procedure Laterality Date   ABDOMINAL HYSTERECTOMY     COLONOSCOPY N/A 11/25/2016   Procedure: COLONOSCOPY;  Surgeon: Malissa Hippo, MD;  [facility];  Service: Endoscopy;  Laterality: N/A;  830   EYE SURGERY     FOOT SURGERY Left 2018   reconstructed tendon   KNEE ARTHROSCOPY     BIL   2010    LUNG BIOPSY     RIGHT 30 YRS AGO    TOTAL HIP ARTHROPLASTY Right 12/17/2020   Procedure: RIGHT TOTAL HIP ARTHROPLASTY ANTERIOR APPROACH;  Surgeon: Kathryne Hitch, MD;  [facility];  Service: Orthopedics;  Laterality: Right;   TOTAL KNEE ARTHROPLASTY Right 02/12/2014   Procedure: TOTAL KNEE ARTHROPLASTY;  Surgeon: Dannielle Huh, MD;  Location[facility];  Service: Orthopedics;  Laterality: Right;    Allergies  Allergen Reactions   Oxycodone Nausea And Vomiting    Pt stated, "It makes me not want to eat"   Hydrocodone Itching     Physical Exam: General: The patient is alert and oriented x3 in no acute distress.  Dermatology: Skin is warm, dry and supple bilateral lower extremities.   Vascular: Palpable pedal pulses bilaterally. Capillary refill within normal limits.  No appreciable edema.  No erythema.  Neurological: Grossly intact via light touch  Musculoskeletal Exam: No pedal deformities noted.  There is  some tenderness with palpation throughout the midtarsal joint of the dorsum of the right foot consistent with DJD/arthritis  Radiographic Exam B/L foot 01/13/2023:  Diffuse degenerative changes noted throughout the pedal joints of the foot.  No acute fractures identified.  Joint space narrowing throughout the midtarsal joints.  Assessment/Plan of Care: 1.  Capsulitis/DJD right foot -Injection of 0.5 cc Celestone Soluspan injected into the right midtarsal joint -Prescription for meloxicam 15 mg daily -Continue wearing good supportive tennis shoes and sneakers -Return to clinic as needed     Felecia Shelling, DPM [facility]  Dr. Felecia Shelling, DPM    [address] [place], [place] [address]                Office [phone]  Fax [phone]

## 2023-02-23 LAB — HM DIABETES EYE EXAM

## 2023-03-16 ENCOUNTER — Telehealth: Payer: Self-pay | Admitting: Orthopaedic Surgery

## 2023-03-16 NOTE — Telephone Encounter (Signed)
Patient called and said her pain is coming from the hip and going around to the back. She in a lot of pain. CB#[phone]

## 2023-03-25 ENCOUNTER — Ambulatory Visit: Payer: Medicare Other | Admitting: Physician Assistant

## 2023-03-25 ENCOUNTER — Telehealth: Payer: Self-pay | Admitting: Radiology

## 2023-03-25 ENCOUNTER — Encounter: Payer: Self-pay | Admitting: Physician Assistant

## 2023-03-25 VITALS — Ht 64.57 in | Wt 233.8 lb

## 2023-03-25 DIAGNOSIS — M1712 Unilateral primary osteoarthritis, left knee: Secondary | ICD-10-CM | POA: Diagnosis not present

## 2023-03-25 DIAGNOSIS — M7061 Trochanteric bursitis, right hip: Secondary | ICD-10-CM

## 2023-03-25 MED ORDER — LIDOCAINE HCL 1 % IJ SOLN
3.0000 mL | INTRAMUSCULAR | Status: AC | PRN
Start: 2023-03-25 — End: 2023-03-25
  Administered 2023-03-25: 3 mL

## 2023-03-25 MED ORDER — METHYLPREDNISOLONE ACETATE 40 MG/ML IJ SUSP
40.0000 mg | INTRAMUSCULAR | Status: AC | PRN
Start: 2023-03-25 — End: 2023-03-25
  Administered 2023-03-25: 40 mg via INTRA_ARTICULAR

## 2023-03-25 MED ORDER — METHYLPREDNISOLONE ACETATE 40 MG/ML IJ SUSP
40.0000 mg | INTRAMUSCULAR | Status: AC | PRN
  Administered 2023-03-25: 40 mg via INTRA_ARTICULAR

## 2023-03-25 NOTE — Telephone Encounter (Signed)
LT Knee visco supplemental

## 2023-03-25 NOTE — Progress Notes (Signed)
Office Visit Note   Patient: Jennifer [name]           Date of Birth: [birth year]           MRN: [id number] Visit Date: 03/25/2023              Requested by: Raliegh Ip, DO [address] [place],  [place] [address] PCP: Raliegh Ip, DO   Assessment & Plan: Visit Diagnoses:  1. Trochanteric bursitis, right hip   2. Primary osteoarthritis of left knee     Plan: Given the fact the patient is failed conservative treatment which is included injections of cortisone left knee, over-the-counter medications, and quad strengthening exercises.  Recommend that she undergo viscosupplementation of the left knee.  She does not wish to have a total knee at this point in time.  She has no surgery scheduled on the left knee in the last 6 months.  Regards to the right hip she is shown IT band stretching exercises that she can perform.  Questions were encouraged and answered.  Follow-Up Instructions: Return for Supplemental injection.   Orders:  Orders Placed This Encounter  Procedures   Large Joint Inj   Large Joint Inj   No orders of the defined types were placed in this encounter.     Procedures: Large Joint Inj: L knee on 03/25/2023 3:09 PM Indications: pain Details: 22 G 1.5 in needle, anterolateral approach  Arthrogram: No  Medications: 3 mL lidocaine 1 %; 40 mg methylPREDNISolone acetate 40 MG/ML Outcome: tolerated well, no immediate complications Procedure, treatment alternatives, risks and benefits explained, specific risks discussed. Consent was given by the patient. Immediately prior to procedure a time out was called to verify the correct patient, procedure, equipment, support staff and site/side marked as required. Patient was prepped and draped in the usual sterile fashion.    Large Joint Inj: R greater trochanter on 03/25/2023 3:10 PM Indications: pain Details: 22 G 1.5 in needle, lateral approach  Arthrogram: No  Medications: 3 mL lidocaine 1 %; 40 mg  methylPREDNISolone acetate 40 MG/ML Outcome: tolerated well, no immediate complications Procedure, treatment alternatives, risks and benefits explained, specific risks discussed. Consent was given by the patient. Immediately prior to procedure a time out was called to verify the correct patient, procedure, equipment, support staff and site/side marked as required. Patient was prepped and draped in the usual sterile fashion.       Clinical Data: No additional findings.   Subjective: Chief Complaint  Patient presents with   Right Hip - Pain   Left Knee - Pain    HPI Jennifer [name] returns to today complaining of right hip and buttocks pain.  She has history of a right total hip arthroplasty.  Had no groin pain.  Has pain when driving or when lying on the right hip.  No radicular symptoms down the leg.  She is unsure of how long this has been ongoing.  Last injection for trochanteric bursitis was 11/02/2022.  Known left knee osteoarthritis with tricompartmental arthritis with near bone-on-bone lateral compartment and severe patellofemoral arthritic changes.  She has had no known injury left knee.  States the last injection which was also on 11/02/2022 only gave her about a weeks worth of relief.  She has been taking Tylenol and meloxicam.  She does state that she has been doing some stretching for her IT band and also working on quad strengthening.  Review of Systems  Constitutional:  Negative for chills and fever.  Objective: Vital Signs: Ht 5' 4.57" (1.64 m)   Wt 233 lb 12.8 oz (106.1 kg)   BMI 39.43 kg/m   Physical Exam Constitutional:      Appearance: She is not ill-appearing or diaphoretic.  Pulmonary:     Effort: Pulmonary effort is normal.  Neurological:     Mental Status: She is alert and oriented to person, place, and time.  Psychiatric:        Mood and Affect: Mood normal.     Ortho Exam Bilateral hips: Good range of motion of both hips.  External rotation of the right  hip causes pain down the lateral aspect of the thigh.  Tenderness right hip trochanteric region and down the IT band. Bilateral knees: Good range of motion both knees.  Right total knee surgical incisions well-healed.  Left knee good range of motion no abnormal warmth erythema or effusion. Specialty Comments:  No specialty comments available.  Imaging: No results found.   PMFS History: Patient Active Problem List   Diagnosis Date Noted   Moderate recurrent major depression (HCC) 07/29/2021   Unilateral primary osteoarthritis, right hip 12/17/2020   Status post total replacement of right hip 12/17/2020   Hip pain 07/24/2020   OSA on CPAP 04/04/2019   Peripheral arterial disease (HCC) 09/21/2017   Type 2 diabetes, with peripheral circulatory disorder not at goal [facility]) 08/16/2017   Chronic pain of left ankle 08/11/2017   Bilateral carpal tunnel syndrome 08/11/2017   Hypertension associated with diabetes (HCC) 08/11/2017   Morbid obesity (HCC) 08/11/2017   Localized primary osteoarthritis of left lower leg 08/11/2017   Hyperlipidemia associated with type 2 diabetes mellitus (HCC) 08/11/2017   Tobacco use disorder 08/11/2017   History of adenomatous polyp of colon 07/23/2016   S/P total knee arthroplasty 02/12/2014   Past Medical History:  Diagnosis Date   Arthritis    Dyspnea    Hyperlipidemia    Hypertension    Pre-diabetes    Sleep apnea     Family History  Problem Relation Age of Onset   Diabetes Mother    Stroke Mother    Alcohol abuse Father    Stroke Father    Colon cancer Neg Hx     Past Surgical History:  Procedure Laterality Date   ABDOMINAL HYSTERECTOMY     COLONOSCOPY N/A 11/25/2016   Procedure: COLONOSCOPY;  Surgeon: Malissa Hippo, MD;  [facility];  Service: Endoscopy;  Laterality: N/A;  830   EYE SURGERY     FOOT SURGERY Left 2018   reconstructed tendon   KNEE ARTHROSCOPY     BIL   2010    LUNG BIOPSY     RIGHT 30 YRS AGO    TOTAL HIP  ARTHROPLASTY Right 12/17/2020   Procedure: RIGHT TOTAL HIP ARTHROPLASTY ANTERIOR APPROACH;  Surgeon: Kathryne Hitch, MD;  [facility]: [facility];  Service: Orthopedics;  Laterality: Right;   TOTAL KNEE ARTHROPLASTY Right 02/12/2014   Procedure: TOTAL KNEE ARTHROPLASTY;  Surgeon: Dannielle Huh, MD;  Location: [facility];  Service: Orthopedics;  Laterality: Right;   Social History   Occupational History   Occupation: CNa    Employer: [employer]  Tobacco Use   Smoking status: Every Day    Current packs/day: 0.50    Average packs/day: 0.5 packs/day for 35.0 years (17.5 ttl pk-yrs)    Types: Cigarettes   Smokeless tobacco: Never  Vaping Use   Vaping status: Never Used  Substance and Sexual Activity   Alcohol  use: No   Drug use: No   Sexual activity: Yes

## 2023-04-16 ENCOUNTER — Telehealth: Payer: Self-pay

## 2023-04-16 NOTE — Telephone Encounter (Signed)
Left vm   No samples at this time

## 2023-04-16 NOTE — Telephone Encounter (Signed)
Copied from CRM [id number]. Topic: Clinical - Medication Question >> Apr 15, 2023  4:05 PM Elle L wrote: Reason for CRM: The patient is requesting for a sample of Budeson-Glycopyrrol-Formoterol (BREZTRI AEROSPHERE) 160-9-4.8 MCG/ACT AERO inhaler. The patient's call back number is [phone].

## 2023-04-27 ENCOUNTER — Ambulatory Visit: Payer: Self-pay | Admitting: Family Medicine

## 2023-04-27 ENCOUNTER — Ambulatory Visit (INDEPENDENT_AMBULATORY_CARE_PROVIDER_SITE_OTHER): Payer: Medicare Other | Admitting: Family Medicine

## 2023-04-27 ENCOUNTER — Encounter: Payer: Self-pay | Admitting: Family Medicine

## 2023-04-27 VITALS — BP 118/73 | HR 73 | Temp 98.4°F | Ht 64.5 in | Wt 231.0 lb

## 2023-04-27 DIAGNOSIS — R6889 Other general symptoms and signs: Secondary | ICD-10-CM | POA: Diagnosis not present

## 2023-04-27 DIAGNOSIS — E1159 Type 2 diabetes mellitus with other circulatory complications: Secondary | ICD-10-CM

## 2023-04-27 DIAGNOSIS — S76011A Strain of muscle, fascia and tendon of right hip, initial encounter: Secondary | ICD-10-CM | POA: Diagnosis not present

## 2023-04-27 DIAGNOSIS — B351 Tinea unguium: Secondary | ICD-10-CM | POA: Diagnosis not present

## 2023-04-27 DIAGNOSIS — I152 Hypertension secondary to endocrine disorders: Secondary | ICD-10-CM

## 2023-04-27 DIAGNOSIS — Z23 Encounter for immunization: Secondary | ICD-10-CM

## 2023-04-27 DIAGNOSIS — E1169 Type 2 diabetes mellitus with other specified complication: Secondary | ICD-10-CM

## 2023-04-27 DIAGNOSIS — E785 Hyperlipidemia, unspecified: Secondary | ICD-10-CM | POA: Diagnosis not present

## 2023-04-27 DIAGNOSIS — J410 Simple chronic bronchitis: Secondary | ICD-10-CM | POA: Diagnosis not present

## 2023-04-27 DIAGNOSIS — Z0001 Encounter for general adult medical examination with abnormal findings: Secondary | ICD-10-CM | POA: Diagnosis not present

## 2023-04-27 DIAGNOSIS — E1151 Type 2 diabetes mellitus with diabetic peripheral angiopathy without gangrene: Secondary | ICD-10-CM | POA: Diagnosis not present

## 2023-04-27 DIAGNOSIS — Z7984 Long term (current) use of oral hypoglycemic drugs: Secondary | ICD-10-CM

## 2023-04-27 DIAGNOSIS — Z6837 Body mass index (BMI) 37.0-37.9, adult: Secondary | ICD-10-CM

## 2023-04-27 DIAGNOSIS — Z0000 Encounter for general adult medical examination without abnormal findings: Secondary | ICD-10-CM

## 2023-04-27 LAB — BAYER DCA HB A1C WAIVED: HB A1C (BAYER DCA - WAIVED): 6.5 % — ABNORMAL HIGH (ref 4.8–5.6)

## 2023-04-27 MED ORDER — TIZANIDINE HCL 4 MG PO TABS: 4.0000 mg | ORAL_TABLET | Freq: Three times a day (TID) | ORAL | 0 refills | Status: DC | PRN

## 2023-04-27 NOTE — Telephone Encounter (Signed)
 Patient aware and verbalized understanding.

## 2023-04-27 NOTE — Telephone Encounter (Signed)
 Rx sent

## 2023-04-27 NOTE — Progress Notes (Signed)
 Jennifer Jennifer [name] is a 73 y.o. female presents to office today for annual physical exam examination.    Concerns today include: 1. Type 2 Diabetes with hypertension, hyperlipidemia:  Patient is compliant with Jennifer [name] medications.  No missed doses. Last eye exam: UTD Last foot exam: needs Last A1c:  Lab Results  Component Value Date   HGBA1C 6.3 (H) 09/28/2022   Nephropathy screen indicated?: UTD Last flu, zoster and/or pneumovax:  Immunization History  Administered Date(s) Administered   Fluad Quad(high Dose 65+) 02/07/2019, 03/08/2020, 05/20/2021   Influenza-Unspecified 01/13/2018   Moderna Covid-19 Vaccine Bivalent Booster 37yrs & up 06/26/2021   Moderna Sars-Cov-2 Peds vaccine 32yrs thru 66yrs 11/20/2020   Moderna Sars-Covid-2 Vaccination 05/25/2019, 06/22/2019, 03/13/2020   PNEUMOCOCCAL CONJUGATE-20 05/20/2021   Pneumococcal Conjugate-13 12/06/2018   Unspecified SARS-COV-2 Vaccination 01/28/2022   Zoster Recombinant(Shingrix ) 11/17/2021    ROS: She reports no chest pain, shortness of breath, dizziness, blurred vision.  Never did hear from the podiatrist for the toenail changes.  However, she does report that she breathes better when she was utilizing the Breztri .  She is under the patient assistance program for this but did not realize that she had a renewed yearly.  2.  Hip strain Patient reports that she has been having some right sided thigh pain laterally.  This occurred after she tried to bend over and clean behind her toilet.  She felt a sharp kind of tearing pain.  It is gradually gotten a little bit better and has been taking Mobic  and Tylenol  in efforts to alleviate some of the discomfort.  Occupation: Retired, Substance use: None Health Maintenance Due  Topic Date Due   Zoster Vaccines- Shingrix  (2 of 2) 01/12/2022   FOOT EXAM  11/18/2022   INFLUENZA VACCINE  11/19/2022   COVID-19 Vaccine (7 - 2024-25 season) 12/20/2022   HEMOGLOBIN A1C  03/30/2023   Refills needed  today: Jennifer [name]  Immunization History  Administered Date(s) Administered   Fluad Quad(high Dose 65+) 02/07/2019, 03/08/2020, 05/20/2021   Influenza-Unspecified 01/13/2018   Moderna Covid-19 Vaccine Bivalent Booster 49yrs & up 06/26/2021   Moderna Sars-Cov-2 Peds vaccine 22yrs thru 71yrs 11/20/2020   Moderna Sars-Covid-2 Vaccination 05/25/2019, 06/22/2019, 03/13/2020   PNEUMOCOCCAL CONJUGATE-20 05/20/2021   Pneumococcal Conjugate-13 12/06/2018   Unspecified SARS-COV-2 Vaccination 01/28/2022   Zoster Recombinant(Shingrix ) 11/17/2021   Past Medical History:  Diagnosis Date   Arthritis    Dyspnea    Hyperlipidemia    Hypertension    Pre-diabetes    Sleep apnea    Social History   Socioeconomic History   Marital status: Married    Spouse name: Jennifer Jennifer [name]   Number of children: 3   Years of education: 12   Highest education level: Not on file  Occupational History   Occupation: CNa    Employer: [employer]  Tobacco Use   Smoking status: Every Day    Current packs/day: 0.50    Average packs/day: 0.5 packs/day for 35.0 years (17.5 ttl pk-yrs)    Types: Cigarettes   Smokeless tobacco: Never  Vaping Use   Vaping status: Never Used  Substance and Sexual Activity   Alcohol use: No   Drug use: No   Sexual activity: Yes  Other Topics Concern   Not on file  Social History Narrative   She lives with her husband   One son passed away from sepsis in 04/23/2020   One son lives with her now   Social Drivers of Corporate Investment Banker Strain: Low  Risk  (08/06/2022)   Overall Financial Resource Strain (CARDIA)    Difficulty of Paying Living Expenses: Not hard at Jennifer [name]  Food Insecurity: No Food Insecurity (08/06/2022)   Hunger Vital Sign    Worried About Running Out of Food in the Last Year: Never true    Ran Out of Food in the Last Year: Never true  Transportation Needs: No Transportation Needs (08/06/2022)   PRAPARE - Administrator, Civil Service (Medical): No    Lack of  Transportation (Non-Medical): No  Physical Activity: Inactive (08/20/2021)   Exercise Vital Sign    Days of Exercise per Week: 0 days    Minutes of Exercise per Session: 0 min  Stress: No Stress Concern Present (08/06/2022)   [employer] of Occupational Health - Occupational Stress Questionnaire    Feeling of Stress : Not at Jennifer [name]  Social Connections: Moderately Isolated (08/06/2022)   Social Connection and Isolation Panel [NHANES]    Frequency of Communication with Friends and Family: More than three times a week    Frequency of Social Gatherings with Friends and Family: More than three times a week    Attends Religious Services: Never    Database Administrator or Organizations: No    Attends Banker Meetings: Never    Marital Status: Married  Catering Manager Violence: Not At Risk (08/06/2022)   Humiliation, Afraid, Rape, and Kick questionnaire    Fear of Current or Ex-Partner: No    Emotionally Abused: No    Physically Abused: No    Sexually Abused: No   Past Surgical History:  Procedure Laterality Date   ABDOMINAL HYSTERECTOMY     COLONOSCOPY N/A 11/25/2016   Procedure: COLONOSCOPY;  Surgeon: Jennifer Jennifer PENNER, MD;  Location: [facility];  Service: Endoscopy;  Laterality: N/A;  830   EYE SURGERY     FOOT SURGERY Left 2018   reconstructed tendon   KNEE ARTHROSCOPY     BIL   2010    LUNG BIOPSY     RIGHT 30 YRS AGO    TOTAL HIP ARTHROPLASTY Right 12/17/2020   Procedure: RIGHT TOTAL HIP ARTHROPLASTY ANTERIOR APPROACH;  Surgeon: Jennifer Jennifer GRADE, MD;  Location: [facility] OR;  Service: Orthopedics;  Laterality: Right;   TOTAL KNEE ARTHROPLASTY Right 02/12/2014   Procedure: TOTAL KNEE ARTHROPLASTY;  Surgeon: Jennifer Raman, MD;  Location: [facility] OR;  Service: Orthopedics;  Laterality: Right;   Family History  Problem Relation Age of Onset   Diabetes Mother    Stroke Mother    Alcohol abuse Father    Stroke Father    Colon cancer Neg Hx     Current Outpatient  Medications:    acetaminophen  (TYLENOL ) 500 MG tablet, Take 1,000 mg by mouth 2 (two) times daily as needed for mild pain or moderate pain. , Disp: , Rfl:    atenolol  (TENORMIN ) 25 MG tablet, Take 1 tablet (25 mg total) by mouth daily., Disp: 100 tablet, Rfl: 3   atorvastatin  (LIPITOR) 40 MG tablet, Take 1 tablet (40 mg total) by mouth at bedtime., Disp: 100 tablet, Rfl: 3   Budeson-Glycopyrrol-Formoterol (BREZTRI  AEROSPHERE) 160-9-4.8 MCG/ACT AERO, Inhale 2 puffs into the lungs 2 (two) times daily. Rinse mouth out after, Disp: 32.1 g, Rfl: 11   calcium  carbonate (TUMS - DOSED IN MG ELEMENTAL CALCIUM ) 500 MG chewable tablet, Chew 1,000 mg by mouth daily as needed for indigestion or heartburn., Disp: , Rfl:    Coenzyme Q-10 100 MG capsule, Take  2 capsules (200 mg total) by mouth daily. (Patient taking differently: Take 100 mg by mouth daily.), Disp: 60 capsule, Rfl: 6   fenofibrate  (TRICOR ) 48 MG tablet, Take 1 tablet (48 mg total) by mouth daily. For triglycerides, Disp: 100 tablet, Rfl: 3   magnesium hydroxide (MILK OF MAGNESIA) 400 MG/5ML suspension, Take 15 mLs by mouth daily as needed for mild constipation., Disp: , Rfl:    meloxicam  (MOBIC ) 15 MG tablet, Take 1 tablet (15 mg total) by mouth daily., Disp: 60 tablet, Rfl: 1   metFORMIN  (GLUCOPHAGE ) 500 MG tablet, Take 1 tablet (500 mg total) by mouth daily with breakfast., Disp: 100 tablet, Rfl: 3   Multiple Vitamins-Minerals (ADULT GUMMY PO), Take 1 capsule by mouth daily., Disp: , Rfl:    triamterene -hydrochlorothiazide  (MAXZIDE -25) 37.5-25 MG tablet, Take 1 tablet by mouth daily., Disp: 100 tablet, Rfl: 3  Allergies  Allergen Reactions   Oxycodone  Nausea And Vomiting    Pt stated, It makes me not want to eat   Hydrocodone  Itching     ROS: Review of Systems Pertinent items noted in HPI and remainder of comprehensive ROS otherwise negative.    Physical exam BP 118/73   Pulse 73   Temp 98.4 F (36.9 C) (Temporal)   Ht 5' 4.5  (1.638 m)   Wt 231 lb (104.8 kg)   SpO2 93%   BMI 39.04 kg/m  General appearance: alert, cooperative, appears stated age, and no distress Head: Normocephalic, without obvious abnormality, atraumatic Eyes: negative findings: lids and lashes normal, conjunctivae and sclerae normal, corneas clear, and pupils equal, round, reactive to light and accomodation Ears: normal TM's and external ear canals both ears Nose: Nares normal. Septum midline. Mucosa normal. No drainage or sinus tenderness. Throat: lips, mucosa, and tongue normal; teeth and gums normal Neck: no adenopathy, supple, symmetrical, trachea midline, and thyroid  not enlarged, symmetric, no tenderness/mass/nodules Back: symmetric, no curvature. ROM normal. No CVA tenderness.  She does have a slightly antalgic gait however.  No visible soft tissue changes or abnormalities in the right thigh present Lungs: Mild expiratory rhonchi at the bases.  No Rales or wheezes.  She has normal work of breathing on room air. Heart: regular rate and rhythm, S1, S2 normal, no murmur, click, rub or gallop Abdomen: soft, non-tender; bowel sounds normal; no masses,  no organomegaly Extremities: extremities normal, atraumatic, no cyanosis or edema Pulses: 2+ and symmetric Skin: Skin color, texture, turgor normal. No rashes or lesions Lymph nodes: Cervical, supraclavicular, and axillary nodes normal. Neurologic: Grossly normal      10/16/2022    3:57 PM 09/28/2022    1:21 PM 08/06/2022   12:34 PM  Depression screen PHQ 2/9  Decreased Interest 1 0 0  Down, Depressed, Hopeless 0 0 0  PHQ - 2 Score 1 0 0  Altered sleeping 0 0   Tired, decreased energy 1 0   Change in appetite 1 0   Feeling bad or failure about yourself  0 0   Trouble concentrating 0 0   Moving slowly or fidgety/restless 0 0   Suicidal thoughts 0 0   PHQ-9 Score 3 0   Difficult doing work/chores Not difficult at Jennifer [name] Not difficult at Jennifer [name]       10/16/2022    3:58 PM 09/28/2022    1:21  PM 11/17/2021   11:39 AM 05/20/2021    1:19 PM  GAD 7 : Generalized Anxiety Score  Nervous, Anxious, on Edge 0 0 0 0  Control/stop worrying  0 0 0 0  Worry too much - different things 0 0 0 0  Trouble relaxing 0 0 0 0  Restless 0 0 0 0  Easily annoyed or irritable 0 0 0 0  Afraid - awful might happen 0 0 0 0  Total GAD 7 Score 0 0 0 0  Anxiety Difficulty Not difficult at Jennifer [name] Not difficult at Jennifer [name] Not difficult at Jennifer [name] Not difficult at Jennifer [name]   Diabetic Foot Exam - Simple   Simple Foot Form Diabetic Foot exam was performed with the following findings: Yes 04/27/2023  7:14 PM  Visual Inspection See comments: Yes Sensation Testing Intact to touch and monofilament testing bilaterally: Yes Pulse Check Posterior Tibialis and Dorsalis pulse intact bilaterally: Yes Comments Onychomycotic changes noted throughout the nails bilaterally      Assessment/ Plan: Jennifer Jennifer [name] here for annual physical exam.   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  Referral to podiatry for onychomycosis.  Foot exam performed and sensation was intact.  A1c demonstrated control of diabetes.  No changes.  Fasting lipid, liver function test collected  today.  She will continue statin  Check vitamin D , CBC given history of morbid obesity and use of metformin .  Influenza vaccination administered.  Will CC [facility] to [facility] for follow-up on Breztri  PAP.  Encourage patient to schedule appointment ASAP  Counseled on healthy lifestyle choices, including diet (rich in fruits, vegetables and lean meats and low in salt and simple carbohydrates) and exercise (at least 30 minutes of moderate physical activity daily).  Patient to follow up 64m for DM  Bashir Marchetti M. Jolinda, DO

## 2023-04-27 NOTE — Telephone Encounter (Signed)
 Message from Jennifer [name] sent at 04/27/2023  2:00 PM EST  Patient is calling to have Dr. Jolinda give her a phone call as she is requesting to actually have the muscle relaxers that were offered to her prescribed to her afterall. Patient has changed her mind and is needing them.  Pharmacy: [facility] [address] on chart.

## 2023-04-27 NOTE — Patient Instructions (Addendum)
 This is Dr. Maylon Office information. I put the referral in again. Not sure why you didn't get called when I did it last year. [address], [place] [address] [phone]  Make sure to get up with Mliss for the Breztri .  That has to be renewed yearly.

## 2023-04-28 ENCOUNTER — Telehealth: Payer: Self-pay

## 2023-04-28 ENCOUNTER — Other Ambulatory Visit: Payer: Self-pay | Admitting: Family Medicine

## 2023-04-28 LAB — CBC
Hematocrit: 43 % (ref 34.0–46.6)
Hemoglobin: 14.1 g/dL (ref 11.1–15.9)
MCH: 29.7 pg (ref 26.6–33.0)
MCHC: 32.8 g/dL (ref 31.5–35.7)
MCV: 91 fL (ref 79–97)
Platelets: 278 10*3/uL (ref 150–450)
RBC: 4.75 x10E6/uL (ref 3.77–5.28)
RDW: 13.8 % (ref 11.7–15.4)
WBC: 7.1 10*3/uL (ref 3.4–10.8)

## 2023-04-28 LAB — CMP14+EGFR
ALT: 14 [IU]/L (ref 0–32)
AST: 19 [IU]/L (ref 0–40)
Albumin: 4.5 g/dL (ref 3.9–4.9)
Alkaline Phosphatase: 96 [IU]/L (ref 44–121)
BUN/Creatinine Ratio: 19 (ref 12–28)
BUN: 16 mg/dL (ref 8–27)
Bilirubin Total: 0.3 mg/dL (ref 0.0–1.2)
CO2: 22 mmol/L (ref 20–29)
Calcium: 9.9 mg/dL (ref 8.7–10.3)
Chloride: 100 mmol/L (ref 96–106)
Creatinine, Ser: 0.85 mg/dL (ref 0.57–1.00)
Globulin, Total: 2.6 g/dL (ref 1.5–4.5)
Glucose: 107 mg/dL — ABNORMAL HIGH (ref 70–99)
Potassium: 4.2 mmol/L (ref 3.5–5.2)
Sodium: 138 mmol/L (ref 134–144)
Total Protein: 7.1 g/dL (ref 6.0–8.5)
eGFR: 74 mL/min/{1.73_m2} (ref >59)

## 2023-04-28 LAB — LIPID PANEL
Chol/HDL Ratio: 4.8 {ratio} — ABNORMAL HIGH (ref 0.0–4.4)
Cholesterol, Total: 183 mg/dL (ref 100–199)
HDL: 38 mg/dL — ABNORMAL LOW (ref >39)
LDL Chol Calc (NIH): 111 mg/dL — ABNORMAL HIGH (ref 0–99)
Triglycerides: 191 mg/dL — ABNORMAL HIGH (ref 0–149)
VLDL Cholesterol Cal: 34 mg/dL (ref 5–40)

## 2023-04-28 LAB — VITAMIN D 25 HYDROXY (VIT D DEFICIENCY, FRACTURES): Vit D, 25-Hydroxy: 12.9 ng/mL — ABNORMAL LOW (ref 30.0–100.0)

## 2023-04-28 MED ORDER — VITAMIN D (ERGOCALCIFEROL) 1.25 MG (50000 UNIT) PO CAPS: 50000.0000 [IU] | ORAL_CAPSULE | ORAL | 1 refills | Status: DC

## 2023-04-28 NOTE — Telephone Encounter (Signed)
 Copied from CRM [id number]. Topic: Clinical - Medication Question >> Apr 28, 2023 10:07 AM Deleta HERO wrote: Reason for CRM: The patient received a notification from her pharmacy, [facility] to pick-up Vitamin D , Ergocalciferol , (DRISDOL ) 1.25 MG, she wanted to determine if this was from her lab work from yesterday. The patient is requesting to have PCP review lab results that came back yesterday, she is requesting a callback if possible and she would like to speak with her about the medication prescribed. Callback #: [phone]

## 2023-04-30 ENCOUNTER — Telehealth: Payer: Self-pay

## 2023-04-30 NOTE — Telephone Encounter (Signed)
 VOB submitted for Durolane, left knee.

## 2023-04-30 NOTE — Progress Notes (Signed)
 Care Guide Pharmacy Note  04/30/2023 Name: JANIELLE MITTELSTADT MRN: [id number] DOB: 1950-08-22  Referred By: Jolinda Norene HERO, DO Reason for referral: Care Coordination (Outreach to schedule with Pharm d )   LAMONT GLASSCOCK is a 73 y.o. year old female who is a primary care patient of Jolinda Norene HERO, DO.  Gatha Mcnulty Eldred was referred to the pharmacist for assistance related to:  chronic bronchitis   Successful contact was made with the patient to discuss pharmacy services including being ready for the pharmacist to call at least 5 minutes before the scheduled appointment time and to have medication bottles and any blood pressure readings ready for review. The patient agreed to meet with the pharmacist via telephone visit on (date/time).05/25/2023  Jeoffrey Buffalo , RMA     [place]  [facility], [facility] Guide  Direct Dial: [phone]  Website: [email or web address]

## 2023-05-02 ENCOUNTER — Other Ambulatory Visit: Payer: Self-pay | Admitting: Podiatry

## 2023-05-12 ENCOUNTER — Other Ambulatory Visit: Payer: Self-pay | Admitting: Podiatry

## 2023-05-17 ENCOUNTER — Telehealth: Payer: Self-pay | Admitting: Family Medicine

## 2023-05-17 NOTE — Telephone Encounter (Signed)
Copied from CRM [id number]. Topic: Clinical - Medication Question >> May 17, 2023 11:03 AM Ivette P wrote: Reason for CRM: Pt wants to kno wsince she is no longer taking meloxicam (MOBIC) 15 MG tablet and wants to know if she can go back to taking Ibuprofen 600MG , requesting callback  [phone]

## 2023-05-17 NOTE — Telephone Encounter (Signed)
I'm confused. Looks like her foot doctor refilled it just a couple weeks ago. Is she stopping it totally? If so, yes ok to go back to taking Ibuprofen.

## 2023-05-24 NOTE — Progress Notes (Signed)
 05/25/2023 Name: Jennifer [name] MRN: [id number] DOB: 08-20-1950  No chief complaint on file.   Jennifer [name] is a 73 y.o. year old female who presented for a telephone visit.  I connected with  Jennifer [name] on 05/25/23 by telephone and verified that I am speaking with the correct person using two identifiers. I discussed the limitations of evaluation and management by telemedicine. The patient expressed understanding and agreed to proceed.  Patient was located in her home and PharmD in PCP office during this visit.   They were referred to the pharmacist by their PCP for assistance in managing complex medication management.    Subjective:   Care Team: Primary Care Provider: Jolinda Norene HERO, DO ; Next Scheduled Visit: 10/25/2023  Medication Access/Adherence  Current Pharmacy:  [facility] [address] [place] - [address] [place] [address] Phone: [phone] Fax: [phone]  [facility] - [place], [place] - [address] [place] [address] Phone: [phone] Fax: [phone]   Patient reports affordability concerns with their medications: Yes  Patient reports access/transportation concerns to their pharmacy: No  Patient reports adherence concerns with their medications:  No    Diabetes:  Current medications: metformin  500 mg daily  Patient is interested in starting on Ozempic for diabetes and to help with weight loss. Thinks that weight loss may help with her arthritis.  Current glucose readings: 75-100s Using glucometer - checking randomly  Patient denies hypoglycemic s/sx including dizziness, shakiness, sweating. Patient denies hyperglycemic symptoms including polyuria, polydipsia, polyphagia, nocturia, neuropathy, blurred vision.  Current physical activity: limited by hip pain  Current medication access support: Will submit for Novo Nordisk PAP for Ozempic   Hyperlipidemia/ASCVD Risk Reduction  Current lipid  lowering medications: atorvastatin  40 mg daily, fenofibrate  48 mg daily  ASCVD History: PAD Family History: mother- diabetes, stroke; father- stroke Risk Factors: T2DM, HTN, obesity, tobacco use  Current physical activity: limited by hip pain   COPD:  Current medications: Breztri  - 2 puffs BID  Patient reports breathing is better with Breztri .  Current medication access support: Will submit AZ&Me PAP for Breztri     Objective:  Lab Results  Component Value Date   HGBA1C 6.5 (H) 04/27/2023    Lab Results  Component Value Date   CREATININE 0.85 04/27/2023   BUN 16 04/27/2023   NA 138 04/27/2023   K 4.2 04/27/2023   CL 100 04/27/2023   CO2 22 04/27/2023    Lab Results  Component Value Date   CHOL 183 04/27/2023   HDL 38 (L) 04/27/2023   LDLCALC 111 (H) 04/27/2023   LDLDIRECT 82 09/28/2022   TRIG 191 (H) 04/27/2023   CHOLHDL 4.8 (H) 04/27/2023    Medications Reviewed Today     Reviewed by Jennifer [name], RPH (Pharmacist) on 05/25/23 at 1308  Med List Status: <None>   Medication Order Taking? Sig Documenting Provider Last Dose Status Informant  acetaminophen  (TYLENOL ) 500 MG tablet [id number] Yes Take 1,000 mg by mouth 2 (two) times daily as needed for mild pain or moderate pain.  [provider] Taking Active Self  atenolol  (TENORMIN ) 25 MG tablet [id number] Yes Take 1 tablet (25 mg total) by mouth daily. Jennifer Norene M, DO Taking Active   atorvastatin  (LIPITOR) 40 MG tablet [id number] Yes Take 1 tablet (40 mg total) by mouth at bedtime. Jennifer Norene M, DO Taking Active   Budeson-Glycopyrrol-Formoterol (BREZTRI  AEROSPHERE) 160-9-4.8 MCG/ACT  AERO [id number] Yes Inhale 2 puffs into the lungs 2 (two) times daily. Rinse mouth out after Jennifer Norene HERO, DO Taking Active            Med Note (Jennifer [name]   Tue Jan 06, 2022  8:11 AM) APPLYING FOR AZ&ME PATIENT ASSISTANCE (SEND TO [facility])  calcium  carbonate (TUMS - DOSED IN MG ELEMENTAL  CALCIUM ) 500 MG chewable tablet [id number] Yes Chew 1,000 mg by mouth daily as needed for indigestion or heartburn. [provider] Taking Active Self  Coenzyme Q-10 100 MG capsule [id number] Yes Take 2 capsules (200 mg total) by mouth daily.  Patient taking differently: Take 100 mg by mouth daily.   Jennifer [name] PARAS, MD Taking Active Self  fenofibrate  (TRICOR ) 48 MG tablet [id number] Yes Take 1 tablet (48 mg total) by mouth daily. For triglycerides Jennifer Norene M, DO Taking Active   meloxicam  (MOBIC ) 15 MG tablet [id number] Yes Take 1 tablet by mouth once daily [name], Jennifer [name], DPM Taking Active   metFORMIN  (GLUCOPHAGE ) 500 MG tablet [id number] Yes Take 1 tablet (500 mg total) by mouth daily with breakfast. Jennifer Norene HERO, DO Taking Active   Multiple Vitamins-Minerals (ADULT GUMMY PO) [id number] Yes Take 1 capsule by mouth daily. [provider] Taking Active Self  polyethylene glycol (MIRALAX  / GLYCOLAX ) 17 g packet [id number] Yes Take 17 g by mouth daily as needed. [provider] Taking Active   tiZANidine  (ZANAFLEX ) 4 MG tablet [id number] Yes Take 1 tablet (4 mg total) by mouth every 8 (eight) hours as needed for muscle spasms. Jennifer Norene M, DO Taking Active   triamterene -hydrochlorothiazide  (MAXZIDE -25) 37.5-25 MG tablet [id number] Yes Take 1 tablet by mouth daily. Jennifer Norene M, DO Taking Active   Vitamin [name] , Ergocalciferol , (DRISDOL ) 1.25 MG (50000 UNIT) CAPS capsule [id number] Yes Take 1 capsule (50,000 Units total) by mouth every 7 (seven) days. Then follow up for vit [name] recheck Jennifer Norene HERO, DO Taking Active               Assessment/Plan:   Diabetes: - Currently controlled based on last A1c 6.5%. Patient would benefit from Ozempic given dx of PAD, T2DM and obesity (BMI of 39).  - Reviewed long term cardiovascular and renal outcomes of uncontrolled blood sugar - Reviewed goal A1c, goal fasting, and goal 2 hour post prandial glucose -  Reviewed lifestyle modifications including: physical activity as able - Recommend to start Ozempic 0.25 mg weekly  - Recommend to continue metformin  500 mg daily - Patient denies personal or family history of multiple endocrine neoplasia type 2, medullary thyroid  cancer; personal history of pancreatitis or gallbladder disease. - Recommend to check FBG at least a few times weekly - Meets financial criteria for [employer] patient assistance program through Novo Nordisk. Will collaborate with provider, CPhT, and patient to pursue assistance.     Hyperlipidemia/ASCVD Risk Reduction: - Currently uncontrolled based on LDL 111 mg/dl, above goal <44 mg/dl given dx of PAD. Patient also with risk factors T2DM, HLD, HTN, tobacco use and obesity. LDL significantly increased since last lipid panel, but patient reports continued adherence to atorvastatin  and fenofibrate  as prescribed. Says she does not think she was fasting at the time she had labs drawn. - Reviewed long term complications of uncontrolled cholesterol - Reviewed dietary recommendations including limiting intake of processed foods - Reviewed lifestyle recommendations including incorporating physical activity as able - Recommend to increase atorvastatin  to 80 mg daily.  - Recommend to continue fenofibrate  48  mg daily - F/u lipid panel due at next PCP visit    COPD: - Currently controlled.  - Recommend to continue Breztri  - 2 puffs twice daily - Meets financial criteria for Breztri  patient assistance program through AZ&Me. Will collaborate with provider, CPhT, and patient to pursue assistance.    Follow Up Plan: PharmD on 06/22/2023 and PCP on 10/25/2023   Izetta Henry, PharmD PGY-1 Pharmacy Resident  Mliss Tarry Griffin, PharmD, BCACP, CPP Clinical Pharmacist, [facility]

## 2023-05-25 ENCOUNTER — Telehealth: Payer: Self-pay | Admitting: Pharmacist

## 2023-05-25 ENCOUNTER — Other Ambulatory Visit (INDEPENDENT_AMBULATORY_CARE_PROVIDER_SITE_OTHER): Payer: Medicare Other | Admitting: Pharmacist

## 2023-05-25 ENCOUNTER — Other Ambulatory Visit (HOSPITAL_COMMUNITY): Payer: Self-pay

## 2023-05-25 DIAGNOSIS — E785 Hyperlipidemia, unspecified: Secondary | ICD-10-CM

## 2023-05-25 DIAGNOSIS — Z720 Tobacco use: Secondary | ICD-10-CM

## 2023-05-25 DIAGNOSIS — E1169 Type 2 diabetes mellitus with other specified complication: Secondary | ICD-10-CM

## 2023-05-25 DIAGNOSIS — R0602 Shortness of breath: Secondary | ICD-10-CM

## 2023-05-25 MED ORDER — ATORVASTATIN CALCIUM 80 MG PO TABS: 80.0000 mg | ORAL_TABLET | Freq: Every day | ORAL | 3 refills | Status: DC

## 2023-05-25 MED ORDER — BREZTRI AEROSPHERE 160-9-4.8 MCG/ACT IN AERO: 2.0000 | INHALATION_SPRAY | Freq: Two times a day (BID) | RESPIRATORY_TRACT | 5 refills | Status: DC

## 2023-05-25 NOTE — Telephone Encounter (Signed)
 Patient would like start Ozempic and medication assistance is needed.  Please mail Novo PAP to patient for Ozempic new start and route me PCP portion for 0.5 mg weekly.  Patient denies personal or family history of medullary thyroid  carcinoma (MTC) or in patients with Multiple Endocrine Neoplasia syndrome type 2 (MEN 2).  Jennifer [name], PharmD PGY-1 Pharmacy Resident

## 2023-05-25 NOTE — Telephone Encounter (Signed)
Patient would like continue Breztri and medication assistance renewal is needed. Please mail AZ&Me PAP to patient for Breztri and route me PCP portion for 2 puffs twice daily.    Jarrett Ables, PharmD PGY-1 Pharmacy Resident

## 2023-05-28 ENCOUNTER — Telehealth: Payer: Self-pay

## 2023-05-28 DIAGNOSIS — R0602 Shortness of breath: Secondary | ICD-10-CM

## 2023-05-28 DIAGNOSIS — Z720 Tobacco use: Secondary | ICD-10-CM

## 2023-05-28 NOTE — Progress Notes (Signed)
 Pharmacy Medication Assistance Program Note    06/29/2023  Patient ID: Jennifer [name], female  DOB: 1950-10-20, 73 y.o.  MRN:  [id number]     05/28/2023  Outreach Medication Two  Manufacturer Medication Two Astra Zeneca  [employer] Drugs Bretztri  Type of Radiographer, Therapeutic Assistance  Date Application Received From Patient 06/23/2023  Application Items Received From Patient Application  Date Application Submitted to Manufacturer 06/29/2023     Submitted renewal   Please send new 90 day RX to [facility] pharmacy. Thanks!

## 2023-05-28 NOTE — Progress Notes (Signed)
 Pharmacy Medication Assistance Program Note    07/05/2023  Patient ID: Jennifer [name], female   DOB: 04-Dec-1950, 73 y.o.   MRN: [id number]     05/28/2023  Outreach Medication One  Manufacturer Medication One Novo Nordisk  [employer]ord[employer] Drugs Ozempic  Dose of Ozempic 0.5mg   Type of Radiographer, Therapeutic Assistance  Date Application Sent to Patient 06/01/2023  Application Items Requested Application  Date Application Sent to Prescriber 06/29/2023  Date Application Received From Patient 06/22/2023  Application Items Received From Patient Application  Date Application Submitted to Manufacturer 06/30/2023  Method Application Sent to Manufacturer Fax  Patient Assistance Determination Approved  Approval Start Date 07/02/2023  Approval End Date 04/19/2024     NEW - approved.

## 2023-06-07 ENCOUNTER — Telehealth: Payer: Self-pay

## 2023-06-07 NOTE — Telephone Encounter (Signed)
Copied from CRM [id number]. Topic: General - Other >> Jun 07, 2023  4:13 PM Antony Haste wrote: Reason for CRM: PT wanted to follow-up on the paperwork that must be signed for her Ozempic, she states she still has not received the forms yet. She would like a callback with an update if possible? Callback #: [phone]

## 2023-06-08 ENCOUNTER — Encounter: Payer: Self-pay | Admitting: Pharmacist

## 2023-06-08 NOTE — Telephone Encounter (Signed)
My chart message sent to patient stating that PAPs were mailed out on 05/28/23

## 2023-06-22 ENCOUNTER — Other Ambulatory Visit (INDEPENDENT_AMBULATORY_CARE_PROVIDER_SITE_OTHER): Payer: Medicare Other | Admitting: Pharmacist

## 2023-06-22 DIAGNOSIS — E1169 Type 2 diabetes mellitus with other specified complication: Secondary | ICD-10-CM

## 2023-06-22 DIAGNOSIS — Z7984 Long term (current) use of oral hypoglycemic drugs: Secondary | ICD-10-CM

## 2023-06-22 DIAGNOSIS — E785 Hyperlipidemia, unspecified: Secondary | ICD-10-CM

## 2023-06-22 DIAGNOSIS — I739 Peripheral vascular disease, unspecified: Secondary | ICD-10-CM

## 2023-06-22 NOTE — Progress Notes (Unsigned)
 06/22/2023 Name: Jennifer [name] MRN: [id number] DOB: 1950-05-22  Chief Complaint  Patient presents with   Diabetes   Hyperlipidemia   COPD    Jennifer [name] is a 73 y.o. year old female who presented for a telephone visit.   They were referred to the pharmacist by their PCP for assistance in managing complex medication management.   Subjective:  Care Team: Primary Care Provider: Raliegh Ip, DO ; Next Scheduled Visit: 10/25/2023  Medication Access/Adherence  Current Pharmacy:  [facility] [address] - [address] [place] [address] Phone: [phone] Fax: [phone]  [facility] - [place], [place] - [address] [place] [address] Phone: [phone] Fax: [phone]   Patient reports affordability concerns with their medications: Yes -breztri Patient reports access/transportation concerns to their pharmacy: No  Patient reports adherence concerns with their medications:  No     Diabetes:  Current medications: metformin 500 mg daily  Patient reports she completed application received in the mail and sent it back. Has not heard determination on Ozempic PAP yet.  Using glucometer;  Before breakfast: usually 90-100s Before lunch: ~140  Patient denies hypoglycemic s/sx including dizziness, shakiness, sweating. Patient denies hyperglycemic symptoms including polyuria, polydipsia, polyphagia, nocturia, neuropathy, blurred vision.  Current physical activity: limited by hip pain  Current medication access support: Awaiting Novo Nordisk Ozempic PAP determination  Hyperlipidemia/ASCVD Risk Reduction  Current lipid lowering medications: atorvastatin 80 mg daily, fenofibrate 48 mg daily  Patient reports she has been taking 2 of the atorvastatin 40 mg tablets daily. When she runs out of current supply plans to pick up 80 mg tablets.  ASCVD History: PAD Family History:  mother- diabetes, stroke; father- stroke   Risk Factors: T2DM, HTN, obesity, tobacco use   Current physical activity: limited by hip pain  COPD:  Current medications: Breztri - 2 puffs BID  Patient reports she completed Breztri PAP applicaton received in the mail and sent it back. Reports breathing is better when she uses Breztri. Has been using only prn to stretch out her supply while awaiting PAP supply.  Current medication access support: Awaiting [place] Breztri PAP determination   Objective:  Lab Results  Component Value Date   HGBA1C 6.5 (H) 04/27/2023    Lab Results  Component Value Date   CREATININE 0.85 04/27/2023   BUN 16 04/27/2023   NA 138 04/27/2023   K 4.2 04/27/2023   CL 100 04/27/2023   CO2 22 04/27/2023    Lab Results  Component Value Date   CHOL 183 04/27/2023   HDL 38 (L) 04/27/2023   LDLCALC 111 (H) 04/27/2023   LDLDIRECT 82 09/28/2022   TRIG 191 (H) 04/27/2023   CHOLHDL 4.8 (H) 04/27/2023    Medications Reviewed Today     Reviewed by Vela Prose, RPH (Pharmacist) on 06/22/23 at 1622  Med List Status: <None>   Medication Order Taking? Sig Documenting Provider Last Dose Status Informant  acetaminophen (TYLENOL) 500 MG tablet [id number]  Take 1,000 mg by mouth 2 (two) times daily as needed for mild pain or moderate pain.  [provider]  Active Self  atenolol (TENORMIN) 25 MG tablet [id number]  Take 1 tablet (25 mg total) by mouth daily. Delynn Flavin M, DO  Active   atorvastatin (LIPITOR) 80 MG tablet [id number] Yes Take 1 tablet (80 mg total) by mouth at bedtime. Raliegh Ip, DO Taking Active  Budeson-Glycopyrrol-Formoterol (BREZTRI AEROSPHERE) 160-9-4.8 MCG/ACT AERO [id number]  Inhale 2 puffs into the lungs 2 (two) times daily. Rinse mouth out after Raliegh Ip, DO  Active   calcium carbonate (TUMS - DOSED IN MG ELEMENTAL CALCIUM) 500 MG chewable tablet [id number]  Chew 1,000 mg by mouth daily as needed for indigestion or heartburn. [provider]   Active Self  Coenzyme Q-10 100 MG capsule [id number]  Take 2 capsules (200 mg total) by mouth daily.  Patient taking differently: Take 100 mg by mouth daily.   Runell Gess, MD  Active Self  fenofibrate (TRICOR) 48 MG tablet [id number] Yes Take 1 tablet (48 mg total) by mouth daily. For triglycerides Delynn Flavin M, DO Taking Active   meloxicam (MOBIC) 15 MG tablet [id number]  Take 1 tablet by mouth once daily Felecia Shelling, DPM  Active   metFORMIN (GLUCOPHAGE) 500 MG tablet [id number] Yes Take 1 tablet (500 mg total) by mouth daily with breakfast. Raliegh Ip, DO Taking Active   Multiple Vitamins-Minerals (ADULT GUMMY PO) [id number]  Take 1 capsule by mouth daily. [provider]  Active Self  polyethylene glycol (MIRALAX / GLYCOLAX) 17 g packet [id number]  Take 17 g by mouth daily as needed. [provider]  Active   tiZANidine (ZANAFLEX) 4 MG tablet [id number]  Take 1 tablet (4 mg total) by mouth every 8 (eight) hours as needed for muscle spasms. Delynn Flavin M, DO  Active   triamterene-hydrochlorothiazide (MAXZIDE-25) 37.5-25 MG tablet [id number]  Take 1 tablet by mouth daily. Delynn Flavin M, DO  Active   Vitamin D, Ergocalciferol, (DRISDOL) 1.25 MG (50000 UNIT) CAPS capsule [id number]  Take 1 capsule (50,000 Units total) by mouth every 7 (seven) days. Then follow up for vit d recheck Delynn Flavin M, DO  Active               Assessment/Plan:   Diabetes: - Currently controlled based on last A1c 6.5%. Patient would benefit from Ozempic given dx of PAD, T2DM and obesity (BMI of 39). Awaiting Ozempic PAP determination. - Reviewed long term cardiovascular and renal outcomes of uncontrolled blood sugar - Reviewed goal A1c, goal fasting, and goal 2 hour post prandial glucose - Reviewed lifestyle modifications including: physical activity as able - Recommend to start Ozempic 0.25 mg weekly pending PAP determination/shipment. - Recommend to continue  metformin 500 mg daily - Patient denies personal or family history of multiple endocrine neoplasia type 2, medullary thyroid cancer; personal history of pancreatitis or gallbladder disease. - Recommend to check FBG at least a few times weekly     Hyperlipidemia/ASCVD Risk Reduction: - Currently uncontrolled based on last LDL 111 mg/dL above goal <06 mg/dL given dx of PAD. Patient also with risk factors T2DM, HLD, HTN, tobacco use, and obesity. LDL significantly increased since last lipid panel, but patient reports continued adherence to atorvastatin and fenofibrate as prescribed. Confirmed adherence to increased atorvastatin dose prescribed at last visit.  - Reviewed long term complications of uncontrolled cholesterol - Reviewed dietary recommendations including limiting intake of processed foods - Plan to discuss smoking cessation at follow up - Recommend to continue atorvastatin 80 mg daily - Recommend to continue fenofibrate 48 mg daily  - F/u lipid panel due next month - PharmD f/u visit scheduled in person and orders placed    COPD: - Currently controlled - Reviewed appropriate inhaler technique. - Recommend to continue Breztri - 2 puffs BID  - Awaiting [place]&Me PAP determination   Follow Up  Plan: PharmD on 07/27/23 and PCP on 10/25/23  Jarrett Ables, PharmD PGY-1 Pharmacy Resident   Kieth Brightly, PharmD, BCACP, CPP Clinical Pharmacist, [facility]  30 min of patient care was provided to the patient during this visit time. No charge visit

## 2023-06-27 ENCOUNTER — Other Ambulatory Visit: Payer: Self-pay | Admitting: Podiatry

## 2023-06-29 MED ORDER — BREZTRI AEROSPHERE 160-9-4.8 MCG/ACT IN AERO: 2.0000 | INHALATION_SPRAY | Freq: Two times a day (BID) | RESPIRATORY_TRACT | 5 refills | Status: AC

## 2023-06-29 NOTE — Telephone Encounter (Signed)
 Meds ordered this encounter  Medications   budeson-glycopyrrolate-formoterol (BREZTRI AEROSPHERE) 160-9-4.8 MCG/ACT AERO    Sig: Inhale 2 puffs into the lungs 2 (two) times daily. Rinse mouth out after    Dispense:  32.1 g    Refill:  5   Done!

## 2023-07-15 ENCOUNTER — Telehealth: Payer: Self-pay | Admitting: Family Medicine

## 2023-07-15 NOTE — Telephone Encounter (Signed)
 Copied from CRM [id number]. Topic: Clinical - Medical Advice >> Jul 15, 2023 12:54 PM Carlatta H wrote: Reason for CRM: PAD screening was 0.85 Right Left 0.75

## 2023-07-16 NOTE — Telephone Encounter (Signed)
 Please offer ABIs if pt wants to be evaluated, I can order in [place] to rule out peripheral artery disease.

## 2023-07-16 NOTE — Telephone Encounter (Signed)
 Called and spoke with patient and advised that the [facility] nurse had contacted [place] with PAD results. Offered additional testing and patient declined at this time. Advised patient that if she changes her mind and decides to have testing done then to contact the office. Patient verbalized understanding

## 2023-07-27 ENCOUNTER — Ambulatory Visit

## 2023-08-10 ENCOUNTER — Ambulatory Visit (INDEPENDENT_AMBULATORY_CARE_PROVIDER_SITE_OTHER): Payer: Medicare Other

## 2023-08-10 VITALS — BP 130/94 | HR 79 | Ht 64.0 in | Wt 231.0 lb

## 2023-08-10 DIAGNOSIS — Z0000 Encounter for general adult medical examination without abnormal findings: Secondary | ICD-10-CM

## 2023-08-10 NOTE — Patient Instructions (Signed)
 Jennifer [name] , Thank you for taking time to come for your Medicare Wellness Visit. I appreciate your ongoing commitment to your health goals. Please review the following plan we discussed and let me know if I can assist you in the future.   Referrals/Orders/Follow-Ups/Clinician Recommendations: Please remember to discuss with your provider about the following: pt is due the following: @2nd  Shingles, dtap, colonoscopy due in 8/25, lung cancer screening, needs orders put in   This is a list of the screening recommended for you and due dates:  Health Maintenance  Topic Date Due   DTaP/Tdap/Td vaccine (1 - Tdap) Never done   Screening for Lung Cancer  Never done   Zoster (Shingles) Vaccine (2 of 2) 01/12/2022   Colon Cancer Screening  11/26/2023   COVID-19 Vaccine (7 - 2024-25 season) 08/25/2024*   Yearly kidney health urinalysis for diabetes  09/28/2023   Mammogram  10/20/2023   Hemoglobin A1C  10/25/2023   Flu Shot  11/19/2023   Eye exam for diabetics  02/23/2024   Yearly kidney function blood test for diabetes  04/26/2024   Complete foot exam   04/26/2024   Medicare Annual Wellness Visit  08/09/2024   Pneumonia Vaccine  Completed   DEXA scan (bone density measurement)  Completed   Hepatitis C Screening  Completed   HPV Vaccine  Aged Out   Meningitis B Vaccine  Aged Out  *Topic was postponed. The date shown is not the original due date.    Advanced directives: (Declined) Advance directive discussed with you today. Even though you declined this today, please call our office should you change your mind, and we can give you the proper paperwork for you to fill out.  Next Medicare Annual Wellness Visit scheduled for next year: Yes

## 2023-08-10 NOTE — Progress Notes (Signed)
 Subjective:   Jennifer [name] is a 73 y.o. who presents for a Medicare Wellness preventive visit.  Visit Complete: Virtual I connected with  Andreah Goheen Fason on 08/10/23 by a audio enabled telemedicine application and verified that I am speaking with the correct person using two identifiers.  Patient Location: Home  Provider Location: Home Office  I discussed the limitations of evaluation and management by telemedicine. The patient expressed understanding and agreed to proceed.  Vital Signs: Because this visit was a virtual/telehealth visit, some criteria may be missing or patient reported. Any vitals not documented were not able to be obtained and vitals that have been documented are patient reported.  VideoDeclined- This patient declined Librarian, academic. Therefore the visit was completed with audio only.  Persons Participating in Visit: Patient.  AWV Questionnaire: Yes: Patient Medicare AWV questionnaire was completed prior to this visit.  Cardiac Risk Factors include: advanced age (>61men, >57 women);obesity (BMI >30kg/m2);diabetes mellitus;dyslipidemia;hypertension;smoking/ tobacco exposure;Other (see comment), Risk factor comments: Peripheral arterial disease (HCC)     Objective:    Today's Vitals   08/10/23 1352  BP: (!) 130/94  Pulse: 79  Weight: 231 lb (104.8 kg)  Height: 5\' 4"  (1.626 m)  PainSc: 8    Body mass index is 39.65 kg/m.     08/10/2023    1:59 PM 08/06/2022   12:35 PM 07/29/2021    3:53 PM 12/17/2020   11:46 PM 12/06/2020    2:38 PM 07/26/2020    2:44 PM 04/06/2019    9:03 AM  Advanced Directives  Does Patient Have a Medical Advance Directive? No No No No No No No  Copy of Healthcare Power of Attorney in Chart? No - copy requested        Would patient like information on creating a medical advance directive?  No - Patient declined No - Patient declined No - Patient declined No - Patient declined Yes  (MAU/Ambulatory/Procedural Areas - Information given) Yes (MAU/Ambulatory/Procedural Areas - Information given)    Current Medications (verified) Outpatient Encounter Medications as of 08/10/2023  Medication Sig   acetaminophen  (TYLENOL ) 500 MG tablet Take 1,000 mg by mouth 2 (two) times daily as needed for mild pain or moderate pain.    atenolol  (TENORMIN ) 25 MG tablet Take 1 tablet (25 mg total) by mouth daily.   atorvastatin  (LIPITOR) 80 MG tablet Take 1 tablet (80 mg total) by mouth at bedtime.   budeson-glycopyrrolate -formoterol (BREZTRI  AEROSPHERE) 160-9-4.8 MCG/ACT AERO Inhale 2 puffs into the lungs 2 (two) times daily. Rinse mouth out after   calcium  carbonate (TUMS - DOSED IN MG ELEMENTAL CALCIUM ) 500 MG chewable tablet Chew 1,000 mg by mouth daily as needed for indigestion or heartburn.   Coenzyme Q-10 100 MG capsule Take 2 capsules (200 mg total) by mouth daily. (Patient taking differently: Take 100 mg by mouth daily.)   fenofibrate  (TRICOR ) 48 MG tablet Take 1 tablet (48 mg total) by mouth daily. For triglycerides   meloxicam  (MOBIC ) 15 MG tablet Take 1 tablet by mouth once daily   metFORMIN  (GLUCOPHAGE ) 500 MG tablet Take 1 tablet (500 mg total) by mouth daily with breakfast.   Multiple Vitamins-Minerals (ADULT GUMMY PO) Take 1 capsule by mouth daily.   polyethylene glycol (MIRALAX  / GLYCOLAX ) 17 g packet Take 17 g by mouth daily as needed.   tiZANidine  (ZANAFLEX ) 4 MG tablet Take 1 tablet (4 mg total) by mouth every 8 (eight) hours as needed for muscle spasms.   triamterene -hydrochlorothiazide  (  MAXZIDE -25) 37.5-25 MG tablet Take 1 tablet by mouth daily.   Vitamin D , Ergocalciferol , (DRISDOL ) 1.25 MG (50000 UNIT) CAPS capsule Take 1 capsule (50,000 Units total) by mouth every 7 (seven) days. Then follow up for vit d recheck   No facility-administered encounter medications on file as of 08/10/2023.    Allergies (verified) Oxycodone  and Hydrocodone    History: Past Medical  History:  Diagnosis Date   Arthritis    Dyspnea    Hyperlipidemia    Hypertension    Pre-diabetes    Sleep apnea    Past Surgical History:  Procedure Laterality Date   ABDOMINAL HYSTERECTOMY     COLONOSCOPY N/A 11/25/2016   Procedure: COLONOSCOPY;  Surgeon: Ruby Corporal, MD;  Location: [facility];  Service: Endoscopy;  Laterality: N/A;  830   EYE SURGERY     FOOT SURGERY Left 2016-10-11   reconstructed tendon   KNEE ARTHROSCOPY     BIL   2010    LUNG BIOPSY     RIGHT 30 YRS AGO    TOTAL HIP ARTHROPLASTY Right 12/17/2020   Procedure: RIGHT TOTAL HIP ARTHROPLASTY ANTERIOR APPROACH;  Surgeon: Arnie Lao, MD;  Location: [facility] OR;  Service: Orthopedics;  Laterality: Right;   TOTAL KNEE ARTHROPLASTY Right 02/12/2014   Procedure: TOTAL KNEE ARTHROPLASTY;  Surgeon: Christie Cox, MD;  Location: [facility] OR;  Service: Orthopedics;  Laterality: Right;   Family History  Problem Relation Age of Onset   Diabetes Mother    Stroke Mother    Alcohol abuse Father    Stroke Father    Colon cancer Neg Hx    Social History   Socioeconomic History   Marital status: Married    Spouse name: [name]   Number of children: 3   Years of education: 12   Highest education level: Not on file  Occupational History   Occupation: CNa    Employer: [employer]  Tobacco Use   Smoking status: Every Day    Current packs/day: 0.50    Average packs/day: 0.5 packs/day for 85.3 years (42.6 ttl pk-yrs)    Types: Cigarettes    Start date: 04/26/1973    Passive exposure: Never   Smokeless tobacco: Never  Vaping Use   Vaping status: Never Used  Substance and Sexual Activity   Alcohol use: No   Drug use: No   Sexual activity: Yes  Other Topics Concern   Not on file  Social History Narrative   She lives with her husband   One son passed away from sepsis in 10/12/19   One son lives with her now   Social Drivers of Health   Financial Resource Strain: Low Risk  (08/10/2023)   Overall Financial Resource  Strain (CARDIA)    Difficulty of Paying Living Expenses: Not hard at all  Food Insecurity: No Food Insecurity (08/10/2023)   Hunger Vital Sign    Worried About Running Out of Food in the Last Year: Never true    Ran Out of Food in the Last Year: Never true  Transportation Needs: No Transportation Needs (08/10/2023)   PRAPARE - Administrator, Civil Service (Medical): No    Lack of Transportation (Non-Medical): No  Physical Activity: Insufficiently Active (08/10/2023)   Exercise Vital Sign    Days of Exercise per Week: 3 days    Minutes of Exercise per Session: 20 min  Stress: No Stress Concern Present (08/06/2022)   [employer] of Occupational Health - Occupational Stress Questionnaire  Feeling of Stress : Not at all  Social Connections: Moderately Isolated (08/10/2023)   Social Connection and Isolation Panel [NHANES]    Frequency of Communication with Friends and Family: More than three times a week    Frequency of Social Gatherings with Friends and Family: More than three times a week    Attends Religious Services: Never    Database administrator or Organizations: No    Attends Engineer, structural: Never    Marital Status: Married    Tobacco Counseling Ready to quit: Not Answered Counseling given: Yes    Clinical Intake:  Pre-visit preparation completed: Yes  Pain : 0-10 (usual Chronic hip/knee pain) Pain Score: 8  Pain Type: Chronic pain Pain Relieving Factors: Tylenol  Effect of Pain on Daily Activities: no  Pain Relieving Factors: Tylenol   BMI - recorded: 39.65 Nutritional Status: BMI > 30  Obese Nutritional Risks: None Diabetes: Yes CBG done?: No (100)  Lab Results  Component Value Date   HGBA1C 6.5 (H) 04/27/2023   HGBA1C 6.3 (H) 09/28/2022   HGBA1C 6.5 (H) 11/17/2021     How often do you need to have someone help you when you read instructions, pamphlets, or other written materials from your doctor or pharmacy?: 1 -  Never  Interpreter Needed?: No  Information entered by :: Alia T/cma   Activities of Daily Living     08/09/2023    5:37 PM  In your present state of health, do you have any difficulty performing the following activities:  Hearing? 0  Vision? 0  Difficulty concentrating or making decisions? 0  Walking or climbing stairs? 1  Dressing or bathing? 0  Doing errands, shopping? 0  Preparing Food and eating ? N  Using the Toilet? N  In the past six months, have you accidently leaked urine? N  Do you have problems with loss of bowel control? N  Managing your Medications? N  Managing your Finances? N  Housekeeping or managing your Housekeeping? N    Patient Care Team: Eliodoro Guerin, DO as PCP - General (Family Medicine) Avanell Leigh, MD as Consulting Physician (Cardiology) Augustin Bloch, OD (Optometry) Alpheus Arvin Donata Fryer, [facility] as Pharmacist (Family Medicine)  Indicate any recent Medical Services you may have received from other than [facility] providers in the past year (date may be approximate).     Assessment:   This is a routine wellness examination for [facility].  Hearing/Vision screen Hearing Screening - Comments:: Pt denies hearing dif Vision Screening - Comments:: Pt denies vision dif Pt goes to [facility] In [place], [place]   Goals Addressed             This Visit's Progress    Patient Stated       Pt would like to restart sitting/caring for others       Depression Screen     08/10/2023    2:05 PM 10/16/2022    3:57 PM 09/28/2022    1:21 PM 08/06/2022   12:34 PM 11/17/2021   11:39 AM 07/29/2021    3:43 PM 05/20/2021    1:18 PM  PHQ 2/9 Scores  PHQ - 2 Score 1 1 0 0 0 2 1  PHQ- 9 Score 5 3 0  0 2 1    Fall Risk     08/09/2023    5:37 PM 10/16/2022    3:58 PM 09/28/2022    1:20 PM 08/06/2022   12:33 PM 11/17/2021   11:39 AM  Fall  Risk   Falls in the past year? 0 0 0 0 0  Number falls in past yr:  0 0 0   Injury with Fall?  0 0 0   Risk for fall due to :  No  Fall Risks No Fall Risks No Fall Risks   Follow up  Falls evaluation completed Falls evaluation completed Falls prevention discussed     MEDICARE RISK AT HOME:  Medicare Risk at Home Any stairs in or around the home?: (Patient-Rptd) No If so, are there any without handrails?: (Patient-Rptd) No Home free of loose throw rugs in walkways, pet beds, electrical cords, etc?: (Patient-Rptd) Yes Adequate lighting in your home to reduce risk of falls?: (Patient-Rptd) Yes Life alert?: (Patient-Rptd) No Use of a cane, walker or w/c?: (Patient-Rptd) Yes Grab bars in the bathroom?: (Patient-Rptd) Yes Shower chair or bench in shower?: (Patient-Rptd) No Elevated toilet seat or a handicapped toilet?: (Patient-Rptd) No  TIMED UP AND GO:  Was the test performed?  No  Cognitive Function: 6CIT completed        08/10/2023    2:11 PM 08/06/2022   12:35 PM 07/29/2021    3:45 PM 04/06/2019    8:44 AM  6CIT Screen  What Year? 0 points 0 points 0 points 0 points  What month? 0 points 0 points 0 points 0 points  What time? 0 points 0 points 0 points 0 points  Count back from 20 0 points 0 points 0 points 0 points  Months in reverse 0 points 0 points 0 points 0 points  Repeat phrase 0 points 0 points 0 points 0 points  Total Score 0 points 0 points 0 points 0 points    Immunizations Immunization History  Administered Date(s) Administered   Fluad Quad(high Dose 65+) 02/07/2019, 03/08/2020, 05/20/2021   Fluad Trivalent(High Dose 65+) 04/27/2023   Influenza-Unspecified 01/13/2018   Moderna Covid-19 Fall Seasonal Vaccine 37yrs & older 01/28/2022   Moderna Covid-19 Vaccine Bivalent Booster 16yrs & up 06/26/2021   Moderna Sars-Cov-2 Peds vaccine 10yrs thru 23yrs 11/20/2020   Moderna Sars-Covid-2 Vaccination 05/25/2019, 06/22/2019, 03/13/2020   PNEUMOCOCCAL CONJUGATE-20 05/20/2021   Pneumococcal Conjugate-13 12/06/2018   Unspecified SARS-COV-2 Vaccination 01/28/2022   Zoster Recombinant(Shingrix )  11/17/2021    Screening Tests Health Maintenance  Topic Date Due   DTaP/Tdap/Td (1 - Tdap) Never done   Lung Cancer Screening  Never done   Zoster Vaccines- Shingrix  (2 of 2) 01/12/2022   Colonoscopy  11/26/2023   COVID-19 Vaccine (7 - 2024-25 season) 08/25/2024 (Originally 12/20/2022)   Diabetic kidney evaluation - Urine ACR  09/28/2023   MAMMOGRAM  10/20/2023   HEMOGLOBIN A1C  10/25/2023   INFLUENZA VACCINE  11/19/2023   OPHTHALMOLOGY EXAM  02/23/2024   Diabetic kidney evaluation - eGFR measurement  04/26/2024   FOOT EXAM  04/26/2024   Medicare Annual Wellness (AWV)  08/09/2024   Pneumonia Vaccine 45+ Years old  Completed   DEXA SCAN  Completed   Hepatitis C Screening  Completed   HPV VACCINES  Aged Out   Meningococcal B Vaccine  Aged Out    Health Maintenance  Health Maintenance Due  Topic Date Due   DTaP/Tdap/Td (1 - Tdap) Never done   Lung Cancer Screening  Never done   Zoster Vaccines- Shingrix  (2 of 2) 01/12/2022   Colonoscopy  11/26/2023   Health Maintenance Items Addressed: See Nurse Notes  Additional Screening:  Vision Screening: Recommended annual ophthalmology exams for early detection of glaucoma and other disorders of the  eye.  Dental Screening: Recommended annual dental exams for proper oral hygiene  Community Resource Referral / Chronic Care Management: CRR required this visit?  No   CCM required this visit?  No     Plan:     I have personally reviewed and noted the following in the patient's chart:   Medical and social history Use of alcohol, tobacco or illicit drugs  Current medications and supplements including opioid prescriptions. Patient is not currently taking opioid prescriptions. Functional ability and status Nutritional status Physical activity Advanced directives List of other physicians Hospitalizations, surgeries, and ER visits in previous 12 months Vitals Screenings to include cognitive, depression, and falls Referrals and  appointments  In addition, I have reviewed and discussed with patient certain preventive protocols, quality metrics, and best practice recommendations. A written personalized care plan for preventive services as well as general preventive health recommendations were provided to patient.     Michaelle Adolphus, CMA   08/10/2023   After Visit Summary: (MyChart) Due to this being a telephonic visit, the after visit summary with patients personalized plan was offered to patient via MyChart   Notes: Clinician Recommendations: Please remember to discuss with your provider about the following: pt is due the following: @2nd  Shingles, dtap, colonoscopy due in 8/25, lung cancer screening, needs orders put in

## 2023-08-20 ENCOUNTER — Other Ambulatory Visit (INDEPENDENT_AMBULATORY_CARE_PROVIDER_SITE_OTHER): Admitting: Pharmacist

## 2023-08-20 DIAGNOSIS — E119 Type 2 diabetes mellitus without complications: Secondary | ICD-10-CM

## 2023-08-20 DIAGNOSIS — Z7985 Long-term (current) use of injectable non-insulin antidiabetic drugs: Secondary | ICD-10-CM

## 2023-08-20 NOTE — Progress Notes (Signed)
 08/20/2023 Name: Jennifer [name] MRN: [id number] DOB: [birth year]  Chief Complaint  Patient presents with   Diabetes    Jennifer [name] is a 73 y.o. year old female who presented for a telephone visit.  I connected with  Garrie Disotell Daughdrill on 08/20/23 by telephone and verified that I am speaking with the correct person using two identifiers.  I discussed the limitations of evaluation and management by telemedicine. The patient expressed understanding and agreed to proceed.  Patient was located in her home and PharmD in PCP office during this visit.   They were referred to the pharmacist by their PCP for assistance in managing diabetes and medication access.   Subjective:  Reports breathing is controlled and she has received her Breztri  in the mail. Her ozempic has arrived to PCP office and patient will pick up on Monday, 5/5 and start Ozempic.  She reports BG is controlled and no additional concerns  Care Team: Primary Care Provider: Eliodoro Guerin, DO ; Next Scheduled Visit: 10/2023   Medication Access/Adherence  Current Pharmacy:  [facility] [address] [place] - [address] [place] [address] Phone: [phone] Fax: [phone]  [facility] - [place], [place] - [address] [place] [address] Phone: [phone] Fax: [phone]  Patient reports affordability concerns with their medications: Yes --branded medications (Ozempic) Patient reports access/transportation concerns to their pharmacy: Yes , some Patient reports adherence concerns with their medications:  No     Diabetes:  Current medications: metformin  500 mg daily, new start Ozempic 0.25mg  sq weekly   Patient reports she completed application received in the mail and sent it back. Has not heard determination on Ozempic PAP yet.   Using traditional glucometer; only occasionally checks Before breakfast: usually 90-100s Before lunch: ~140   Patient denies hypoglycemic  s/sx including dizziness, shakiness, sweating. Patient denies hyperglycemic symptoms including polyuria, polydipsia, polyphagia, nocturia, neuropathy, blurred vision.   Current physical activity: limited by hip pain   Current medication access support: approved for Novo Nordisk Ozempic PAP--1st pick up 08/20/23 (4 month supply)   Hyperlipidemia/ASCVD Risk Reduction   Current lipid lowering medications: atorvastatin  80 mg daily, fenofibrate  48 mg daily   Patient reports she has been taking 2 of the atorvastatin  40 mg tablets daily. When she runs out of current supply plans to pick up 80 mg tablets.   ASCVD History: PAD Family History:  mother- diabetes, stroke; father- stroke  Risk Factors: T2DM, HTN, obesity, tobacco use    Current physical activity: limited by hip pain   COPD:   Current medications: Breztri  - 2 puffs BID   Patient reports she completed Breztri  PAP applicaton received in the mail and sent it back. Reports breathing is better when she uses Breztri . Has been using only prn to stretch out her supply while awaiting PAP supply.   Current medication access support: approved for [place]&Me Breztri  PAP determination  Current physical activity: limited as above  Objective:  Lab Results  Component Value Date   HGBA1C 6.5 (H) 04/27/2023    Lab Results  Component Value Date   CREATININE 0.85 04/27/2023   BUN 16 04/27/2023   NA 138 04/27/2023   K 4.2 04/27/2023   CL 100 04/27/2023   CO2 22 04/27/2023    Lab Results  Component Value Date   CHOL 183 04/27/2023   HDL 38 (L) 04/27/2023   LDLCALC 111 (H) 04/27/2023  LDLDIRECT 82 09/28/2022   TRIG 191 (H) 04/27/2023   CHOLHDL 4.8 (H) 04/27/2023    Medications Reviewed Today     Reviewed by Delilah Fend, [facility] (Pharmacist) on 08/20/23 at 1559  Med List Status: <None>   Medication Order Taking? Sig Documenting Provider Last Dose Status Informant  acetaminophen  (TYLENOL ) 500 MG tablet [id number]  Take 1,000 mg by  mouth 2 (two) times daily as needed for mild pain or moderate pain.  [provider]  Active Self  atenolol  (TENORMIN ) 25 MG tablet [id number]  Take 1 tablet (25 mg total) by mouth daily. Vicky Grange M, DO  Active   atorvastatin  (LIPITOR) 80 MG tablet [id number]  Take 1 tablet (80 mg total) by mouth at bedtime. Vicky Grange M, DO  Active   budeson-glycopyrrolate -formoterol (BREZTRI  AEROSPHERE) 160-9-4.8 MCG/ACT AERO [id number]  Inhale 2 puffs into the lungs 2 (two) times daily. Rinse mouth out after Eliodoro Guerin, DO  Active            Med Note Alpheus Arvin, Jymir Dunaj D   Fri Aug 20, 2023  3:59 PM) Via AZ&me patient assistance program    calcium  carbonate (TUMS - DOSED IN MG ELEMENTAL CALCIUM ) 500 MG chewable tablet [id number]  Chew 1,000 mg by mouth daily as needed for indigestion or heartburn. [provider]  Active Self  Coenzyme Q-10 100 MG capsule [id number]  Take 2 capsules (200 mg total) by mouth daily.  Patient taking differently: Take 100 mg by mouth daily.   Avanell Leigh, MD  Active Self  fenofibrate  (TRICOR ) 48 MG tablet [id number]  Take 1 tablet (48 mg total) by mouth daily. For triglycerides Vicky Grange M, DO  Active   meloxicam  (MOBIC ) 15 MG tablet [id number]  Take 1 tablet by mouth once daily Dot Gazella, DPM  Active   metFORMIN  (GLUCOPHAGE ) 500 MG tablet [id number]  Take 1 tablet (500 mg total) by mouth daily with breakfast. Eliodoro Guerin, DO  Active   Multiple Vitamins-Minerals (ADULT GUMMY PO) [id number]  Take 1 capsule by mouth daily. [provider]  Active Self  polyethylene glycol (MIRALAX  / GLYCOLAX ) 17 g packet [id number]  Take 17 g by mouth daily as needed. [provider]  Active   tiZANidine  (ZANAFLEX ) 4 MG tablet [id number]  Take 1 tablet (4 mg total) by mouth every 8 (eight) hours as needed for muscle spasms. Vicky Grange M, DO  Active   triamterene -hydrochlorothiazide  (MAXZIDE -25) 37.5-25 MG tablet [id number]   Take 1 tablet by mouth daily. Vicky Grange M, DO  Active   Vitamin D , Ergocalciferol , (DRISDOL ) 1.25 MG (50000 UNIT) CAPS capsule [id number]  Take 1 capsule (50,000 Units total) by mouth every 7 (seven) days. Then follow up for vit d recheck Vicky Grange M, DO  Active              Assessment/Plan:    Diabetes: - Currently controlled based on last A1c 6.5%. Patient would benefit from Ozempic given dx of PAD, T2DM and obesity (BMI of 39). Approved for Ozempic PAP and patient to pick up and start on 08/23/23 - Reviewed long term cardiovascular and renal outcomes of uncontrolled blood sugar - Reviewed goal A1c, goal fasting, and goal 2 hour post prandial glucose - Reviewed lifestyle modifications including: physical activity as able - START Ozempic 0.25 mg weekly - Recommend to continue metformin  500 mg daily; Will likely d/c in 4 weeks or upcoming PCP appt in July 2025 - Patient denies personal or family history of  multiple endocrine neoplasia type 2, medullary thyroid  cancer; personal history of pancreatitis or gallbladder disease. - Recommend to check FBG at least a few times weekly or if symptomatic     Hyperlipidemia/ASCVD Risk Reduction: - Currently uncontrolled based on last LDL 111 mg/dL above goal <21 mg/dL given dx of PAD. Patient also with risk factors T2DM, HLD, HTN, tobacco use, and obesity. LDL significantly increased since last lipid panel, but patient reports continued adherence to atorvastatin  and fenofibrate  as prescribed. Confirmed adherence to increased atorvastatin  dose prescribed at last visit.  - Reviewed long term complications of uncontrolled cholesterol - Reviewed dietary recommendations including limiting intake of processed foods - Plan to discuss smoking cessation at follow up - Recommend to continue atorvastatin  80 mg daily - Recommend to continue fenofibrate  48 mg daily  - F/u lipid panel due next month - PharmD f/u visit scheduled in person and orders  placed      COPD: - Currently controlled - Reviewed appropriate inhaler technique. - Recommend to continue Breztri  - 2 puffs BID  - Approved & received Breztri  via AZ&Me PAP     Follow Up Plan: PharmD in 4 weeks PCP on 10/25/23     Marvell Slider, PharmD, BCACP, CPP Clinical Pharmacist, [facility]   30 min of patient care was provided to the patient during this visit time. No charge visit

## 2023-08-21 ENCOUNTER — Other Ambulatory Visit: Payer: Self-pay | Admitting: Podiatry

## 2023-08-23 ENCOUNTER — Telehealth: Payer: Self-pay | Admitting: Family Medicine

## 2023-08-23 NOTE — Telephone Encounter (Signed)
 Pt Stoffel called back and I connected her to [facility]

## 2023-08-23 NOTE — Telephone Encounter (Signed)
 Spoke with patient and she paid form fee. Will put yellow form in Cathys box.

## 2023-08-23 NOTE — Telephone Encounter (Signed)
 Pt requesting a handicap form to be filled out for her. She is going to call us  to pay form fee over the phone. Did not bring her purse with her.

## 2023-08-25 NOTE — Telephone Encounter (Signed)
LMOV[facility] handicap form ready

## 2023-08-26 NOTE — Progress Notes (Signed)
 Pharmacy Medication Assistance Program Note    08/26/2023  Patient ID: Jennifer [name], female  DOB: 23-Jul-1950, 73 y.o.  MRN:  [id number]     05/28/2023  Outreach Medication Two  Manufacturer Medication Two [employer] Bretztri  Type of Radiographer, therapeutic Assistance  Date Application Received From Patient 06/23/2023  Application Items Received From Patient Application  Date Application Submitted to Manufacturer 06/29/2023  Patient Assistance Determination Approved    RENEWAL APPROVED

## 2023-08-27 ENCOUNTER — Ambulatory Visit: Payer: Self-pay | Admitting: *Deleted

## 2023-08-27 ENCOUNTER — Other Ambulatory Visit: Payer: Self-pay | Admitting: Family Medicine

## 2023-08-27 DIAGNOSIS — S76011A Strain of muscle, fascia and tendon of right hip, initial encounter: Secondary | ICD-10-CM

## 2023-08-27 MED ORDER — TIZANIDINE HCL 4 MG PO TABS: 4.0000 mg | ORAL_TABLET | Freq: Three times a day (TID) | ORAL | 0 refills | Status: DC | PRN

## 2023-08-27 NOTE — Telephone Encounter (Signed)
 I do agree that would be good for her to get an appointment to come back and be seen if she is not better.  I am okay with sending in a refill of the Zanaflex , 30 tablets, no refill.  Please let her know that she cannot double up on the meloxicam  15 mg because it is too much.  Have her keep her appointment with PCP.

## 2023-08-27 NOTE — Telephone Encounter (Signed)
 Patient aware and verbalized understanding.

## 2023-08-27 NOTE — Telephone Encounter (Signed)
  Chief Complaint: muscle pain and pain back right thigh, left shoulder to elbow. Severe at times. Requesting to see PCP.  Requesting to take 2 tablets meloxicam  15 mg  Symptoms: worsening pain left shoulder to elbow. Right back, right top of thigh, and hip. Stiffness upon awakening in am . Using ice pack with some relief. Moderate pain at this time. Frequency: "for a while"  Pertinent Negatives: Patient denies chest pain no difficulty breathing no fever no rash.  Disposition: [] ED /[] Urgent Care (no appt availability in office) / [x] Appointment(In office/virtual)/ []  [facility]/ [] Home Care/ [] Refused Recommended Disposition /[] [facility]/ []  Follow-up with PCP Additional Notes:   Patient only wants to see PCP. Scheduled appt 08/30/23. Please advise if patient can get refill on zanaflex . Patient requesting if she can take 2 tabs meloxicam  15 mg? Recommended patient not to take 2 tabs of meloxicam  until talking to PCP. Please advise.       Copied from CRM [id number]. Topic: Clinical - Red Word Triage >> Aug 27, 2023  9:48 AM Crispin Dolphin wrote: Red Word that prompted transfer to Nurse Triage: Pain - hurts really bad - hurts when walking and reaching - preventing movement 20/10 Reason for Disposition  [1] MODERATE pain (e.g., interferes with normal activities) AND [2] present > 3 days  Answer Assessment - Initial Assessment Questions 1. ONSET: "When did the muscle aches or body pains start?"      For a while  2. LOCATION: "What part of your body is hurting?" (e.g., entire body, arms, legs)      Left arm from shoulder to elbow and left hip back and leg  3. SEVERITY: "How bad is the pain?" (Scale 1-10; or mild, moderate, severe)   - MILD (1-3): doesn't interfere with normal activities    - MODERATE (4-7): interferes with normal activities or awakens from sleep    - SEVERE (8-10):  excruciating pain, unable to do any normal activities      Can sleep, wake up feel stiff.  20/10 at times. Applying cool pack now and pain level moderate 4. CAUSE: "What do you think is causing the pains?"     Not sure  5. FEVER: "Have you been having fever?"     No  6. OTHER SYMPTOMS: "Do you have any other symptoms?" (e.g., chest pain, weakness, rash, cold or flu symptoms, weight loss)     Right side thigh pain on top, stiff like . Back , and hip ice pack  7. PREGNANCY: "Is there any chance you are pregnant?" "When was your last menstrual period?"     na 8. TRAVEL: "Have you traveled out of the country in the last month?" (e.g., travel history, exposures)     na  Protocols used: Muscle Aches and Body Pain-A-AH

## 2023-08-30 ENCOUNTER — Encounter: Payer: Self-pay | Admitting: Nurse Practitioner

## 2023-08-30 ENCOUNTER — Ambulatory Visit: Admitting: Family Medicine

## 2023-08-30 ENCOUNTER — Ambulatory Visit (INDEPENDENT_AMBULATORY_CARE_PROVIDER_SITE_OTHER): Admitting: Nurse Practitioner

## 2023-08-30 VITALS — BP 112/74 | HR 79 | Temp 98.2°F | Ht 64.0 in | Wt 217.6 lb

## 2023-08-30 DIAGNOSIS — M1712 Unilateral primary osteoarthritis, left knee: Secondary | ICD-10-CM | POA: Diagnosis not present

## 2023-08-30 DIAGNOSIS — M1611 Unilateral primary osteoarthritis, right hip: Secondary | ICD-10-CM | POA: Diagnosis not present

## 2023-08-30 MED ORDER — DICLOFENAC SODIUM 1 % EX GEL: 2.0000 g | Freq: Four times a day (QID) | CUTANEOUS | 1 refills | Status: DC

## 2023-08-30 NOTE — Progress Notes (Signed)
 Acute Office Visit  Subjective:     Patient ID: Jennifer [name], female    DOB: 24-Sep-1950, 73 y.o.   MRN: [id number]  Chief Complaint  Patient presents with   Pain    Lower back pain on right side that radiates down leg for long time    HPI Jennifer [name] is a 73 year old female presenting on 08/30/2023 with an acute complaint of worsening back pain. She reports a history of osteoarthritis and chronic hip pain. She previously underwent a left knee replacement and states she has been advised she will need a right knee replacement in the future. She describes her current back pain as both sharp and dull, rating it as 10/10 in intensity, with lingering discomfort that is worst in the morning. She denies recent trauma. The patient states she was finally able to get in touch with a chiropractor, but the earliest available appointment is scheduled for 09/06/2023. Active Ambulatory Problems    Diagnosis Date Noted   S/P total knee arthroplasty 02/12/2014   History of adenomatous polyp of colon 07/23/2016   Chronic pain of left ankle 08/11/2017   Bilateral carpal tunnel syndrome 08/11/2017   Hypertension associated with diabetes (HCC) 08/11/2017   Morbid obesity (HCC) 08/11/2017   Localized primary osteoarthritis of left lower leg 08/11/2017   Hyperlipidemia associated with type 2 diabetes mellitus (HCC) 08/11/2017   Tobacco use disorder 08/11/2017   Type 2 diabetes, with peripheral circulatory disorder not at goal [facility]) 08/16/2017   Peripheral arterial disease (HCC) 09/21/2017   OSA on CPAP 04/04/2019   Hip pain 07/24/2020   Unilateral primary osteoarthritis, right hip 12/17/2020   Status post total replacement of right hip 12/17/2020   Moderate recurrent major depression (HCC) 07/29/2021   Resolved Ambulatory Problems    Diagnosis Date Noted   No Resolved Ambulatory Problems   Past Medical History:  Diagnosis Date   Arthritis    Dyspnea    Hyperlipidemia    Hypertension     Pre-diabetes    Sleep apnea     Review of Systems  Constitutional:  Negative for chills and fever.  HENT:  Negative for congestion and sore throat.   Respiratory:  Negative for cough and wheezing.   Cardiovascular:  Negative for chest pain and leg swelling.  Gastrointestinal:  Negative for constipation, diarrhea, nausea and vomiting.  Musculoskeletal:  Positive for joint pain.       Knee, shoulder  Skin:  Negative for itching and rash.  Neurological:  Negative for dizziness and headaches.   Negative unless indicated in HPI    Objective:    BP 112/74   Pulse 79   Temp 98.2 F (36.8 C) (Temporal)   Ht 5\' 4"  (1.626 m)   Wt 217 lb 9.6 oz (98.7 kg)   SpO2 98%   BMI 37.35 kg/m  BP Readings from Last 3 Encounters:  08/30/23 112/74  08/10/23 (!) 130/94  04/27/23 118/73   Wt Readings from Last 3 Encounters:  08/30/23 217 lb 9.6 oz (98.7 kg)  08/10/23 231 lb (104.8 kg)  04/27/23 231 lb (104.8 kg)      Physical Exam Vitals and nursing note reviewed.  Constitutional:      General: She is not in acute distress. HENT:     Head: Normocephalic and atraumatic.     Nose: Nose normal.     Mouth/Throat:     Mouth: Mucous membranes are moist.  Eyes:     Extraocular Movements: Extraocular movements intact.  Conjunctiva/sclera: Conjunctivae normal.     Pupils: Pupils are equal, round, and reactive to light.  Cardiovascular:     Heart sounds: Normal heart sounds.  Pulmonary:     Effort: Pulmonary effort is normal.     Breath sounds: Normal breath sounds.  Musculoskeletal:     Left shoulder: Tenderness present. Decreased range of motion.     Right knee: Decreased range of motion.     Right lower leg: No edema.     Left lower leg: No edema.     Comments: Had knee replacement  Skin:    General: Skin is warm.     Findings: No rash.  Neurological:     Mental Status: She is alert.     No results found for any visits on 08/30/23.      Assessment & Plan:  Unilateral  primary osteoarthritis, right hip -     Diclofenac  Sodium; Apply 2 g topically 4 (four) times daily.  Dispense: 50 g; Refill: 1  Localized primary osteoarthritis of left lower leg -     Diclofenac  Sodium; Apply 2 g topically 4 (four) times daily.  Dispense: 50 g; Refill: 1   Lev is on 73 year old African-American female seen today for generalized pain, no acute distress She is already on meloxicam  15 mg daily and Zanaflex  4 mg every 8 hours plan to add Voltaren  gel 4 times daily daily -Follow-up with chiropractor as already scheduled  The above assessment and management plan was discussed with the patient. The patient verbalized understanding of and has agreed to the management plan. Patient is aware to call the clinic if they develop any new symptoms or if symptoms persist or worsen. Patient is aware when to return to the clinic for a follow-up visit. Patient educated on when it is appropriate to go to the emergency department.   Return if symptoms worsen or fail to improve.  Fatisha Rabalais [place] Thompson, DNP [facility] [address] [place], [place] [address] [phone]  Note: This document was prepared by [employer] voice dictation technology and any errors that results from this process are unintentional.

## 2023-08-31 ENCOUNTER — Telehealth: Payer: Self-pay

## 2023-08-31 DIAGNOSIS — M1611 Unilateral primary osteoarthritis, right hip: Secondary | ICD-10-CM

## 2023-08-31 DIAGNOSIS — M1712 Unilateral primary osteoarthritis, left knee: Secondary | ICD-10-CM

## 2023-08-31 MED ORDER — DICLOFENAC SODIUM 1 % EX GEL: 2.0000 g | Freq: Four times a day (QID) | CUTANEOUS | 1 refills | Status: DC

## 2023-08-31 NOTE — Telephone Encounter (Signed)
 Patient aware.

## 2023-08-31 NOTE — Addendum Note (Signed)
 Addended by: Rudolfo Cosier C on: 08/31/2023 09:09 AM   Modules accepted: Orders

## 2023-08-31 NOTE — Telephone Encounter (Signed)
 Copied from CRM [id number]. Topic: Clinical - Prescription Issue >> Aug 30, 2023  4:43 PM Ethelle Herb L wrote: Reason for CRM: Patient states she went to pharmacy and advised rdiclofenac Sodium (VOLTAREN ) 1 % GEL rx was not received. Showing sent to pharmacy today.   Patient requesting rx be sent in

## 2023-08-31 NOTE — Telephone Encounter (Signed)
 Rx re sent as patient requested

## 2023-09-01 ENCOUNTER — Telehealth: Payer: Self-pay

## 2023-09-01 NOTE — Telephone Encounter (Signed)
 Copied from CRM [id number]. Topic: General - Other >> Sep 01, 2023 11:28 AM Antwanette L wrote: Reason for CRM: Patient wanted to let Anton Baton NP know that she will not be picking up the diclofenac  Sodium (VOLTAREN ) 1 % GEL. The patient doesn't wont any otc medicine. The patient said she still having some pain. I the patient didn't won't to speak with nurse triage or an alternative medicine.

## 2023-09-09 ENCOUNTER — Other Ambulatory Visit (INDEPENDENT_AMBULATORY_CARE_PROVIDER_SITE_OTHER)

## 2023-09-09 DIAGNOSIS — E119 Type 2 diabetes mellitus without complications: Secondary | ICD-10-CM

## 2023-09-09 DIAGNOSIS — Z7985 Long-term (current) use of injectable non-insulin antidiabetic drugs: Secondary | ICD-10-CM

## 2023-09-09 NOTE — Progress Notes (Signed)
 09/09/2023 Name: Jennifer [name] MRN: [id number] DOB: 1950-12-15  Chief Complaint  Patient presents with   Diabetes   Hyperlipidemia    Jennifer [name] is a 73 y.o. year old female who presented for a telephone visit.   They were referred to the pharmacist by their PCP for assistance in managing diabetes and medication access.    Subjective:  Patient reports she has recently been to the chiropractor and it has been helpful.  She is doing okay on Ozempic 0.25mg  weekly.  She takes Ozempic every Monday and denies side effects.  She has decreased her meal size.  Her ozempic has arrived to PCP office and patient will pick up on Monday, 5/5 and start Ozempic.  She reports BG is fairly controlled and has no additional concerns   Care Team: Primary Care Provider: Eliodoro Guerin, DO   Medication Access/Adherence  Current Pharmacy:  [facility] [address] [place] - [address] [place] [address] Phone: [phone] Fax: [phone]  [facility] - [place], [place] - [address] [place] [address] Phone: [phone] Fax: [phone]   Patient reports affordability concerns with their medications: Yes  Patient reports access/transportation concerns to their pharmacy: No  Patient reports adherence concerns with their medications:  No     Diabetes:   Current medications: metformin  500 mg daily, new start Ozempic 0.25mg  sq weekly   Using traditional glucometer; only occasionally checks Before breakfast: usually 90-100s, FBG 120 this AM but outlier Before lunch: ~140-150   Patient denies hypoglycemic s/sx including dizziness, shakiness, sweating. Patient denies hyperglycemic symptoms including polyuria, polydipsia, polyphagia, nocturia, neuropathy, blurred vision.   Current physical activity: limited by hip pain; seeing chiro   Current medication access support: approved for Novo Nordisk Ozempic PAP--1st pick up 08/20/23 (4 month  supply)    Objective:  Lab Results  Component Value Date   HGBA1C 6.5 (H) 04/27/2023    Lab Results  Component Value Date   CREATININE 0.85 04/27/2023   BUN 16 04/27/2023   NA 138 04/27/2023   K 4.2 04/27/2023   CL 100 04/27/2023   CO2 22 04/27/2023    Lab Results  Component Value Date   CHOL 183 04/27/2023   HDL 38 (L) 04/27/2023   LDLCALC 111 (H) 04/27/2023   LDLDIRECT 82 09/28/2022   TRIG 191 (H) 04/27/2023   CHOLHDL 4.8 (H) 04/27/2023    Medications Reviewed Today     Reviewed by Delilah Fend, [facility] (Pharmacist) on 09/09/23 at 1521  Med List Status: <None>   Medication Order Taking? Sig Documenting Provider Last Dose Status Informant  acetaminophen  (TYLENOL ) 500 MG tablet [id number] No Take 1,000 mg by mouth 2 (two) times daily as needed for mild pain or moderate pain.  [provider] Taking Active Self  atenolol  (TENORMIN ) 25 MG tablet [id number] No Take 1 tablet (25 mg total) by mouth daily. Vicky Grange M, DO Taking Active   atorvastatin  (LIPITOR) 80 MG tablet [id number] No Take 1 tablet (80 mg total) by mouth at bedtime. Vicky Grange M, DO Taking Active   budeson-glycopyrrolate -formoterol (BREZTRI  AEROSPHERE) 160-9-4.8 MCG/ACT AERO [id number] No Inhale 2 puffs into the lungs 2 (two) times daily. Rinse mouth out after Eliodoro Guerin, DO Taking Active            Med Note Alpheus Arvin, Wood Novacek D   Fri Aug 20, 2023  3:59 PM) Via [place] patient [place]  [place]    calcium  carbonate (TUMS - DOSED IN MG ELEMENTAL CALCIUM ) 500 MG chewable tablet [id number] No Chew 1,000 mg by mouth daily as needed for indigestion or heartburn. [provider] Taking Active Self  Coenzyme Q-10 100 MG capsule [id number] No Take 2 capsules (200 mg total) by mouth daily.  Patient taking differently: Take 100 mg by mouth daily.   Avanell Leigh, MD Taking Active Self  diclofenac  Sodium (VOLTAREN ) 1 % GEL [id number]  Apply 2 g topically 4 (four) times daily. [place]  Thompson, Adell Hones, NP  Active   fenofibrate  (TRICOR ) 48 MG tablet [id number] No Take 1 tablet (48 mg total) by mouth daily. For triglycerides Vicky Grange M, DO Taking Active   meloxicam  (MOBIC ) 15 MG tablet [id number] No Take 1 tablet by mouth once daily Evans, Brent M, DPM Taking Active   metFORMIN  (GLUCOPHAGE ) 500 MG tablet [id number] No Take 1 tablet (500 mg total) by mouth daily with breakfast. Eliodoro Guerin, DO Taking Active   Multiple Vitamins-Minerals (ADULT GUMMY PO) [id number] No Take 1 capsule by mouth daily. [provider] Taking Active Self  polyethylene glycol (MIRALAX  / GLYCOLAX ) 17 g packet [id number] No Take 17 g by mouth daily as needed. [provider] Taking Active   Semaglutide,0.25 or 0.5MG /DOS, (OZEMPIC, 0.25 OR 0.5 MG/DOSE,) 2 MG/1.5ML SOPN [id number] No Inject 0.5 mg into the skin once a week. [provider] Taking Active            Med Note Alpheus Arvin, Donata Fryer   Fri Aug 20, 2023  3:59 PM) Via novo nordisk patient assistance program    tiZANidine  (ZANAFLEX ) 4 MG tablet [id number] No Take 1 tablet (4 mg total) by mouth every 8 (eight) hours as needed for muscle spasms. Dettinger, Lucio Sabin, MD Taking Active   triamterene -hydrochlorothiazide  (MAXZIDE -25) 37.5-25 MG tablet [id number] No Take 1 tablet by mouth daily. Vicky Grange M, DO Taking Active   Vitamin D , Ergocalciferol , (DRISDOL ) 1.25 MG (50000 UNIT) CAPS capsule [id number] No Take 1 capsule (50,000 Units total) by mouth every 7 (seven) days. Then follow up for vit d recheck Eliodoro Guerin, DO Taking Active             Diabetes: - Currently controlled based on last A1c 6.5%. Patient would benefit from Ozempic given dx of PAD, T2DM and obesity (BMI of 39). Approved for Ozempic PAP and patient to pick up and start on 08/23/23 - Reviewed long term cardiovascular and renal outcomes of uncontrolled blood sugar - Reviewed goal A1c, goal fasting, and goal 2 hour post prandial glucose -  Reviewed lifestyle modifications including: physical activity as able - INCREASE to Ozempic 0.5 mg weekly after 1st red Ozempic box completed (6 doses of the 0.25mg ) - Recommend to continue metformin  500 mg daily; Will likely d/c at upcoming PharmD or PCP appt in July 2025 - Patient denies personal or family history of multiple endocrine neoplasia type 2, medullary thyroid  cancer; personal history of pancreatitis or gallbladder disease. - Recommend to check FBG at least a few times weekly or if symptomatic -Needs lipid panel (ordered)   Follow Up Plan: PharmD in 3 month, PCP in July  Marvell Slider, PharmD, BCACP, CPP Clinical Pharmacist, [facility]

## 2023-09-13 ENCOUNTER — Other Ambulatory Visit: Payer: Self-pay | Admitting: Family Medicine

## 2023-09-13 DIAGNOSIS — S76011A Strain of muscle, fascia and tendon of right hip, initial encounter: Secondary | ICD-10-CM

## 2023-09-14 NOTE — Telephone Encounter (Signed)
 Last OV 04/27/2023. Last RF 08/27/2023. Next OV 7/7/20285

## 2023-09-21 ENCOUNTER — Telehealth: Payer: Self-pay | Admitting: Family Medicine

## 2023-09-21 NOTE — Telephone Encounter (Signed)
 Copied from CRM [id number]. Topic: Clinical - Prescription Issue >> Sep 21, 2023 11:25 AM Lotus Round B wrote: Reason for CRM: pt calling in to see if her pcp got a message from [place] about her Cpap supplies if there is anyway Dr.Gottschalk  can look into this .

## 2023-09-21 NOTE — Telephone Encounter (Signed)
 Have not received anything from [place], last OV discussing OSA was 09/28/22 will NTBS if she needs this before her 10/25/23 visit w/ PCP

## 2023-09-22 ENCOUNTER — Telehealth: Payer: Self-pay | Admitting: Family Medicine

## 2023-09-22 NOTE — Telephone Encounter (Unsigned)
 Copied from CRM [id number]. Topic: Clinical - Prescription Issue >> Sep 21, 2023 11:25 AM Lotus Round B wrote: Reason for CRM: pt calling in to see if her pcp got a message from [place] about her Cpap supplies if there is anyway Dr.Gottschalk  can look into this . >> Sep 22, 2023  1:56 PM Fredrica W wrote: Pt called back to check on request. Advised per note: Not showing request from [facility]. Pt states it is for her CPAP and she is not sure what to do at this point. Per note needs office visit. No appts before her next with PCP. Thank You

## 2023-10-05 ENCOUNTER — Other Ambulatory Visit: Payer: Self-pay | Admitting: Family Medicine

## 2023-10-05 ENCOUNTER — Telehealth: Payer: Self-pay | Admitting: Family Medicine

## 2023-10-05 DIAGNOSIS — E1169 Type 2 diabetes mellitus with other specified complication: Secondary | ICD-10-CM

## 2023-10-05 NOTE — Telephone Encounter (Signed)
 Informed pt this was sent in on 09/28/23 for #100 w/ 3 refills

## 2023-10-05 NOTE — Telephone Encounter (Signed)
 Copied from CRM [id number]. Topic: Clinical - Medication Refill >> Oct 05, 2023 11:29 AM Ethelle Herb L wrote: Medication: fenofibrate  (TRICOR ) 48 MG tablet  Has the patient contacted their pharmacy? Yes   This is the patient's preferred pharmacy:  [facility] [address] [place] - [address] [place] Denier [address] [place] [address] Phone: [phone] Fax: [phone]   Is this the correct pharmacy for this prescription? Yes  Has the prescription been filled recently? No  Is the patient out of the medication? No  Has the patient been seen for an appointment in the last year OR does the patient have an upcoming appointment? Yes  Can we respond through MyChart? No  Agent: Please be advised that Rx refills may take up to 3 business days. We ask that you follow-up with your pharmacy.

## 2023-10-06 ENCOUNTER — Ambulatory Visit: Payer: Self-pay

## 2023-10-06 NOTE — Telephone Encounter (Signed)
 E2C2 scheduled appointment.

## 2023-10-06 NOTE — Telephone Encounter (Signed)
 FYI Only or Action Required?: FYI only for provider  Patient was last seen in primary care on 08/30/2023 by Anton Baton, NP. Called Nurse Triage reporting Medication Reaction. Symptoms began several days ago. Interventions attempted: Nothing. Symptoms are: unchanged.  Triage Disposition: Call PCP Within 24 Hours  Patient/caregiver understands and will follow disposition?: Yes, will follow disposition  Copied from CRM [id number]. Topic: Clinical - Red Word Triage >> Oct 06, 2023  9:32 AM Juluis Ok wrote: [facility] that prompted transfer to Nurse Triage: Patient states she took her son's Gabapentin and now having muscle spasms and feeling off balance. Answer Assessment - Initial Assessment Questions 1. NAME of MEDICINE: What medicine(s) are you calling about?     gabapentin 2. QUESTION: What is your question? (e.g., double dose of medicine, side effect)     I took my sons gabapentin 5 days ago, s/s started the next day-feeling off balanced,  3. PRESCRIBER: Who prescribed the medicine? Reason: if prescribed by specialist, call should be referred to that group.     Sons PCP 4. SYMPTOMS: Do you have any symptoms? If Yes, ask: What symptoms are you having?  How bad are the symptoms (e.g., mild, moderate, severe)     Feeling off balanced when walking  Protocols used: Medication Question Call-A-AH

## 2023-10-07 ENCOUNTER — Encounter: Payer: Self-pay | Admitting: Family Medicine

## 2023-10-07 ENCOUNTER — Ambulatory Visit

## 2023-10-07 VITALS — BP 120/83 | HR 89 | Ht 64.0 in | Wt 207.0 lb

## 2023-10-07 DIAGNOSIS — T426X5A Adverse effect of other antiepileptic and sedative-hypnotic drugs, initial encounter: Secondary | ICD-10-CM | POA: Diagnosis not present

## 2023-10-07 NOTE — Progress Notes (Signed)
 BP 120/83   Pulse 89   Ht 5' 4 (1.626 m)   Wt 207 lb (93.9 kg)   SpO2 95%   BMI 35.53 kg/m    Subjective:   Patient ID: Jennifer [name], female    DOB: 1950-05-18, 73 y.o.   MRN: [id number]  HPI: Jennifer [name] is a 73 y.o. female presenting on 10/07/2023 for off balance (After taking sons gabapentin due to hip pain)   HPI Patient is coming in today for feelings of off balance and dizziness and some twitchiness.  She also feels tired and just generally weak.  She says this all started after she took a dose of her son's gabapentin about 5 days ago.  She said that she took a 600 mg gabapentin to help with some pain that she was having in her hip and ever since then she has been feeling off and weak and somewhat dizzy and just not feeling right.  She denies any focal numbness or weakness or any unilateral numbness or weakness.  She denies any loss of consciousness or trouble breathing or chest pains.  She denies any urinary or bowel issues.  She denies any fevers or chills or cough or congestion or wheezing.  She does feel like she has some little spasms or twitching's upper abdomen and sometimes in her hands.  Relevant past medical, surgical, family and social history reviewed and updated as indicated. Interim medical history since our last visit reviewed. Allergies and medications reviewed and updated.  Review of Systems  Constitutional:  Positive for fatigue. Negative for chills and fever.  Eyes:  Negative for visual disturbance.  Respiratory:  Negative for chest tightness and shortness of breath.   Cardiovascular:  Negative for chest pain, palpitations and leg swelling.  Genitourinary:  Negative for difficulty urinating and dysuria.  Musculoskeletal:  Negative for back pain and gait problem.  Skin:  Negative for rash.  Neurological:  Positive for dizziness and weakness. Negative for light-headedness and headaches.  Psychiatric/Behavioral:  Negative for agitation and behavioral  problems.   All other systems reviewed and are negative.   Per HPI unless specifically indicated above   Allergies as of 10/07/2023       Reactions   Oxycodone  Nausea And Vomiting   Pt stated, It makes me not want to eat   Hydrocodone  Itching        Medication List        Accurate as of October 07, 2023 11:53 AM. If you have any questions, ask your nurse or doctor.          acetaminophen  500 MG tablet Commonly known as: TYLENOL  Take 1,000 mg by mouth 2 (two) times daily as needed for mild pain or moderate pain.   ADULT GUMMY PO Take 1 capsule by mouth daily.   atenolol  25 MG tablet Commonly known as: TENORMIN  Take 1 tablet (25 mg total) by mouth daily.   atorvastatin  80 MG tablet Commonly known as: LIPITOR Take 1 tablet (80 mg total) by mouth at bedtime.   Breztri  Aerosphere 160-9-4.8 MCG/ACT Aero inhaler Generic drug: budesonide-glycopyrrolate -formoterol Inhale 2 puffs into the lungs 2 (two) times daily. Rinse mouth out after   calcium  carbonate 500 MG chewable tablet Commonly known as: TUMS - dosed in mg elemental calcium  Chew 1,000 mg by mouth daily as needed for indigestion or heartburn.   Coenzyme Q-10 100 MG capsule Take 2 capsules (200 mg total) by mouth daily. What changed: how much to take  diclofenac  Sodium 1 % Gel Commonly known as: Voltaren  Apply 2 g topically 4 (four) times daily.   fenofibrate  48 MG tablet Commonly known as: Tricor  Take 1 tablet (48 mg total) by mouth daily. For triglycerides   meloxicam  15 MG tablet Commonly known as: MOBIC  Take 1 tablet by mouth once daily   metFORMIN  500 MG tablet Commonly known as: GLUCOPHAGE  Take 1 tablet (500 mg total) by mouth daily with breakfast.   Ozempic (0.25 or 0.5 MG/DOSE) 2 MG/1.5ML Sopn Generic drug: Semaglutide(0.25 or 0.5MG /DOS) Inject 0.5 mg into the skin once a week.   polyethylene glycol 17 g packet Commonly known as: MIRALAX  / GLYCOLAX  Take 17 g by mouth daily as needed.    tiZANidine  4 MG tablet Commonly known as: ZANAFLEX  TAKE 1 TABLET BY MOUTH EVERY 8 HOURS AS NEEDED FOR MUSCLE SPASM   triamterene -hydrochlorothiazide  37.5-25 MG tablet Commonly known as: MAXZIDE -25 Take 1 tablet by mouth daily.   Vitamin D  (Ergocalciferol ) 1.25 MG (50000 UNIT) Caps capsule Commonly known as: DRISDOL  Take 1 capsule (50,000 Units total) by mouth every 7 (seven) days. Then follow up for vit d recheck         Objective:   BP 120/83   Pulse 89   Ht 5' 4 (1.626 m)   Wt 207 lb (93.9 kg)   SpO2 95%   BMI 35.53 kg/m   Wt Readings from Last 3 Encounters:  10/07/23 207 lb (93.9 kg)  08/30/23 217 lb 9.6 oz (98.7 kg)  08/10/23 231 lb (104.8 kg)    Physical Exam Vitals and nursing note reviewed.  Constitutional:      General: She is not in acute distress.    Appearance: She is well-developed. She is not diaphoretic.   Eyes:     Conjunctiva/sclera: Conjunctivae normal.    Cardiovascular:     Rate and Rhythm: Normal rate and regular rhythm.     Heart sounds: Normal heart sounds. No murmur heard. Pulmonary:     Effort: Pulmonary effort is normal. No respiratory distress.     Breath sounds: Normal breath sounds. No wheezing.   Skin:    General: Skin is warm and dry.     Findings: No rash.   Neurological:     Mental Status: She is alert and oriented to person, place, and time.     Cranial Nerves: No cranial nerve deficit.     Sensory: No sensory deficit.     Motor: Weakness (4 out of 5 strength in all 4 extremities) present. No tremor, seizure activity or pronator drift.     Coordination: Coordination normal.   Psychiatric:        Behavior: Behavior normal.       Assessment & Plan:   Problem List Items Addressed This Visit   None Visit Diagnoses       Adverse effect of gabapentin, initial encounter    -  Primary   Relevant Orders   CBC with Differential/Platelet   CMP14+EGFR       Likely having a reaction secondary to taking the  gabapentin, do not see any focal weakness that would be indicative of strokelike activity.  Do not see anything that would be indicative of seizure-like activity.  Instructed patient that it could last a week or 2 and to hydrate really well to help flush it out of her system.  Will check blood counts and kidney and liver today. Follow up plan: Return if symptoms worsen or fail to improve.  Counseling provided  for all of the vaccine components Orders Placed This Encounter  Procedures   CBC with Differential/Platelet   CMP14+EGFR    Jolyne Needs, MD [facility] 10/07/2023, 11:53 AM

## 2023-10-08 ENCOUNTER — Ambulatory Visit: Payer: Self-pay | Admitting: Family Medicine

## 2023-10-08 LAB — CMP14+EGFR
ALT: 12 IU/L (ref 0–32)
AST: 19 IU/L (ref 0–40)
Albumin: 4.8 g/dL (ref 3.9–4.9)
Alkaline Phosphatase: 99 IU/L (ref 44–121)
BUN/Creatinine Ratio: 15 (ref 12–28)
BUN: 10 mg/dL (ref 8–27)
Bilirubin Total: 0.5 mg/dL (ref 0.0–1.2)
CO2: 20 mmol/L (ref 20–29)
Calcium: 10 mg/dL (ref 8.7–10.3)
Chloride: 96 mmol/L (ref 96–106)
Creatinine, Ser: 0.66 mg/dL (ref 0.57–1.00)
Globulin, Total: 2.5 g/dL (ref 1.5–4.5)
Glucose: 102 mg/dL — ABNORMAL HIGH (ref 70–99)
Potassium: 4.2 mmol/L (ref 3.5–5.2)
Sodium: 133 mmol/L — ABNORMAL LOW (ref 134–144)
Total Protein: 7.3 g/dL (ref 6.0–8.5)
eGFR: 94 mL/min/{1.73_m2} (ref >59)

## 2023-10-08 LAB — CBC WITH DIFFERENTIAL/PLATELET
Basophils Absolute: 0.1 10*3/uL (ref 0.0–0.2)
Basos: 1 %
EOS (ABSOLUTE): 0.1 10*3/uL (ref 0.0–0.4)
Eos: 1 %
Hematocrit: 44 % (ref 34.0–46.6)
Hemoglobin: 14.5 g/dL (ref 11.1–15.9)
Immature Grans (Abs): 0 10*3/uL (ref 0.0–0.1)
Immature Granulocytes: 0 %
Lymphocytes Absolute: 1.3 10*3/uL (ref 0.7–3.1)
Lymphs: 18 %
MCH: 29.5 pg (ref 26.6–33.0)
MCHC: 33 g/dL (ref 31.5–35.7)
MCV: 90 fL (ref 79–97)
Monocytes Absolute: 0.6 10*3/uL (ref 0.1–0.9)
Monocytes: 8 %
Neutrophils Absolute: 5.1 10*3/uL (ref 1.4–7.0)
Neutrophils: 72 %
Platelets: 323 10*3/uL (ref 150–450)
RBC: 4.91 x10E6/uL (ref 3.77–5.28)
RDW: 14 % (ref 11.7–15.4)
WBC: 7.1 10*3/uL (ref 3.4–10.8)

## 2023-10-21 ENCOUNTER — Other Ambulatory Visit: Payer: Self-pay | Admitting: Family Medicine

## 2023-10-21 DIAGNOSIS — E1169 Type 2 diabetes mellitus with other specified complication: Secondary | ICD-10-CM

## 2023-10-25 ENCOUNTER — Ambulatory Visit: Payer: Self-pay | Admitting: Family Medicine

## 2023-10-25 ENCOUNTER — Ambulatory Visit: Payer: Medicare Other | Admitting: Family Medicine

## 2023-10-25 ENCOUNTER — Telehealth: Payer: Self-pay | Admitting: Family Medicine

## 2023-10-25 ENCOUNTER — Encounter: Payer: Self-pay | Admitting: Family Medicine

## 2023-10-25 VITALS — BP 106/75 | HR 94 | Temp 97.8°F | Ht 64.0 in | Wt 217.0 lb

## 2023-10-25 DIAGNOSIS — E1159 Type 2 diabetes mellitus with other circulatory complications: Secondary | ICD-10-CM | POA: Diagnosis not present

## 2023-10-25 DIAGNOSIS — M25511 Pain in right shoulder: Secondary | ICD-10-CM

## 2023-10-25 DIAGNOSIS — E1169 Type 2 diabetes mellitus with other specified complication: Secondary | ICD-10-CM

## 2023-10-25 DIAGNOSIS — F17200 Nicotine dependence, unspecified, uncomplicated: Secondary | ICD-10-CM

## 2023-10-25 DIAGNOSIS — E785 Hyperlipidemia, unspecified: Secondary | ICD-10-CM | POA: Diagnosis not present

## 2023-10-25 DIAGNOSIS — G8929 Other chronic pain: Secondary | ICD-10-CM

## 2023-10-25 DIAGNOSIS — E119 Type 2 diabetes mellitus without complications: Secondary | ICD-10-CM | POA: Diagnosis not present

## 2023-10-25 DIAGNOSIS — M25512 Pain in left shoulder: Secondary | ICD-10-CM

## 2023-10-25 DIAGNOSIS — Z7985 Long-term (current) use of injectable non-insulin antidiabetic drugs: Secondary | ICD-10-CM | POA: Diagnosis not present

## 2023-10-25 DIAGNOSIS — I152 Hypertension secondary to endocrine disorders: Secondary | ICD-10-CM

## 2023-10-25 DIAGNOSIS — G4733 Obstructive sleep apnea (adult) (pediatric): Secondary | ICD-10-CM

## 2023-10-25 DIAGNOSIS — M25551 Pain in right hip: Secondary | ICD-10-CM

## 2023-10-25 LAB — BAYER DCA HB A1C WAIVED: HB A1C (BAYER DCA - WAIVED): 6.3 % — ABNORMAL HIGH (ref 4.8–5.6)

## 2023-10-25 MED ORDER — METHYLPREDNISOLONE ACETATE 80 MG/ML IJ SUSP
80.0000 mg | Freq: Once | INTRAMUSCULAR | Status: AC
  Administered 2023-10-25: 80 mg via INTRAMUSCULAR

## 2023-10-25 NOTE — Progress Notes (Addendum)
 Subjective: CC:DM PCP: Jolinda Norene HERO, DO YEP:Yjszo W Reller is a 73 y.o. female presenting to clinic today for:  1. Type 2 Diabetes with hypertension, hyperlipidemia:  She reports compliance with all medications.  No hypoglycemic episodes reported.  Diabetes Health Maintenance Due  Topic Date Due   HEMOGLOBIN A1C  10/25/2023   OPHTHALMOLOGY EXAM  02/23/2024   FOOT EXAM  04/26/2024    Last A1c:  Lab Results  Component Value Date   HGBA1C 6.5 (H) 04/27/2023    ROS: No chest pain, shortness of breath.  2.  Right leg pain She reports that she has been having increasing right-sided hip and leg pain.  It occurred gradually about a month ago.  She has an orthopedist, Dr. Vernetta, who has told her that there is nothing going on in her hip but she is worried because she is getting worsening pain and weakness down that hip.  Symptoms are refractory to home muscle relaxants, NSAIDs.  She has not reached out to him and has not seen him in over a year.  She does not report any sensory changes.  No falls but she reports quite of impairment in gait such that she is needing a cane  ROS: Per HPI  Allergies  Allergen Reactions   Oxycodone  Nausea And Vomiting    Pt stated, It makes me not want to eat   Hydrocodone  Itching   Past Medical History:  Diagnosis Date   Arthritis    Dyspnea    Hyperlipidemia    Hypertension    Pre-diabetes    Sleep apnea     Current Outpatient Medications:    acetaminophen  (TYLENOL ) 500 MG tablet, Take 1,000 mg by mouth 2 (two) times daily as needed for mild pain or moderate pain. , Disp: , Rfl:    atenolol  (TENORMIN ) 25 MG tablet, Take 1 tablet (25 mg total) by mouth daily., Disp: 100 tablet, Rfl: 3   atorvastatin  (LIPITOR) 80 MG tablet, Take 1 tablet (80 mg total) by mouth at bedtime., Disp: 100 tablet, Rfl: 3   budeson-glycopyrrolate -formoterol (BREZTRI  AEROSPHERE) 160-9-4.8 MCG/ACT AERO, Inhale 2 puffs into the lungs 2 (two) times daily. Rinse  mouth out after, Disp: 32.1 g, Rfl: 5   calcium  carbonate (TUMS - DOSED IN MG ELEMENTAL CALCIUM ) 500 MG chewable tablet, Chew 1,000 mg by mouth daily as needed for indigestion or heartburn., Disp: , Rfl:    Coenzyme Q-10 100 MG capsule, Take 2 capsules (200 mg total) by mouth daily. (Patient taking differently: Take 100 mg by mouth daily.), Disp: 60 capsule, Rfl: 6   diclofenac  Sodium (VOLTAREN ) 1 % GEL, Apply 2 g topically 4 (four) times daily., Disp: 50 g, Rfl: 1   fenofibrate  (TRICOR ) 48 MG tablet, TAKE 1 TABLET BY MOUTH ONCE DAILY FOR  TRIGLYCERIDES, Disp: 100 tablet, Rfl: 0   meloxicam  (MOBIC ) 15 MG tablet, Take 1 tablet by mouth once daily, Disp: 60 tablet, Rfl: 0   metFORMIN  (GLUCOPHAGE ) 500 MG tablet, Take 1 tablet (500 mg total) by mouth daily with breakfast., Disp: 100 tablet, Rfl: 3   Multiple Vitamins-Minerals (ADULT GUMMY PO), Take 1 capsule by mouth daily., Disp: , Rfl:    polyethylene glycol (MIRALAX  / GLYCOLAX ) 17 g packet, Take 17 g by mouth daily as needed., Disp: , Rfl:    Semaglutide,0.25 or 0.5MG /DOS, (OZEMPIC, 0.25 OR 0.5 MG/DOSE,) 2 MG/1.5ML SOPN, Inject 0.5 mg into the skin once a week., Disp: , Rfl:    tiZANidine  (ZANAFLEX ) 4 MG tablet, TAKE  1 TABLET BY MOUTH EVERY 8 HOURS AS NEEDED FOR MUSCLE SPASM, Disp: 30 tablet, Rfl: 1   triamterene -hydrochlorothiazide  (MAXZIDE -25) 37.5-25 MG tablet, Take 1 tablet by mouth daily., Disp: 100 tablet, Rfl: 3   Vitamin D , Ergocalciferol , (DRISDOL ) 1.25 MG (50000 UNIT) CAPS capsule, Take 1 capsule (50,000 Units total) by mouth every 7 (seven) days. Then follow up for vit d recheck, Disp: 12 capsule, Rfl: 1 Social History   Socioeconomic History   Marital status: Married    Spouse name: [name]   Number of children: 3   Years of education: 12   Highest education level: 12th grade  Occupational History   Occupation: CNa    Employer: [employer]  Tobacco Use   Smoking status: Every Day    Current packs/day: 0.50    Average packs/day:  0.5 packs/day for 85.5 years (42.7 ttl pk-yrs)    Types: Cigarettes    Start date: 04/26/1973    Passive exposure: Never   Smokeless tobacco: Never  Vaping Use   Vaping status: Never Used  Substance and Sexual Activity   Alcohol use: No   Drug use: No   Sexual activity: Yes  Other Topics Concern   Not on file  Social History Narrative   She lives with her husband   One son passed away from sepsis in 11-16-19   One son lives with her now   Social Drivers of Health   Financial Resource Strain: Patient Declined (10/06/2023)   Overall Financial Resource Strain (CARDIA)    Difficulty of Paying Living Expenses: Patient declined  Food Insecurity: Food Insecurity Present (10/06/2023)   Hunger Vital Sign    Worried About Running Out of Food in the Last Year: Sometimes true    Ran Out of Food in the Last Year: Never true  Transportation Needs: Unmet Transportation Needs (10/06/2023)   PRAPARE - Administrator, Civil Service (Medical): Yes    Lack of Transportation (Non-Medical): No  Physical Activity: Inactive (10/06/2023)   Exercise Vital Sign    Days of Exercise per Week: 0 days    Minutes of Exercise per Session: Not on file  Stress: No Stress Concern Present (10/06/2023)   [employer] of Occupational Health - Occupational Stress Questionnaire    Feeling of Stress: Not at all  Social Connections: Moderately Isolated (10/06/2023)   Social Connection and Isolation Panel    Frequency of Communication with Friends and Family: More than three times a week    Frequency of Social Gatherings with Friends and Family: More than three times a week    Attends Religious Services: Patient declined    Database administrator or Organizations: No    Attends Engineer, structural: Not on file    Marital Status: Married  Catering manager Violence: Not At Risk (08/10/2023)   Humiliation, Afraid, Rape, and Kick questionnaire    Fear of Current or Ex-Partner: No    Emotionally Abused:  No    Physically Abused: No    Sexually Abused: No   Family History  Problem Relation Age of Onset   Diabetes Mother    Stroke Mother    Alcohol abuse Father    Stroke Father    Colon cancer Neg Hx     Objective: Office vital signs reviewed. BP 106/75   Pulse 94   Temp 97.8 F (36.6 C)   Ht 5' 4 (1.626 m)   Wt 217 lb (98.4 kg)   SpO2 97%   BMI  37.25 kg/m   Physical Examination:  General: Awake, alert, well nourished, No acute distress HEENT: sclera white, MMM Cardio: regular rate and rhythm, S1S2 heard, no murmurs appreciated Pulm: clear to auscultation bilaterally, no wheezes, rhonchi or rales; normal work of breathing on room air MSK: Antalgic gait.  Utilizing cane for ambulation.  No palpable abnormalities within the lumbar spine or hip.  She has limited active range of motion of bilateral shoulders  Assessment/ Plan: 73 y.o. female   Diabetes mellitus treated with injections of non-insulin medication (HCC) - Plan: Microalbumin / creatinine urine ratio, Bayer DCA Hb A1c Waived  Hyperlipidemia associated with type 2 diabetes mellitus (HCC) - Plan: Lipid Panel  Hypertension associated with diabetes (HCC)  Right hip pain - Plan: methylPREDNISolone  acetate (DEPO-MEDROL ) injection 80 mg  Chronic pain of both shoulders  Tobacco use disorder - Plan: Ambulatory Referral for Lung Cancer Scre  Sugar under good control with A1c at 6.3 today.  Urine microalbumin collected  Lipid panel collected.  Continue all medications as prescribed  Blood pressure controlled.  Could consider reducing triamterene   Depo-Medrol  given for right hip pain.  I suspect she likely has a lumbar component to this but she wished to defer imaging to her orthopedist, whom she will contact today.  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  We discussed range of motion exercises and I did offer her physical therapy referral but she declined this.  Could consider workup for  autoimmune disease if she continues to have ongoing joint issues that are not found to be degenerative **addendum, she called back and agreed to PT  Referral for lung cancer screening placed  Graviela Nodal CHRISTELLA Fielding, DO [facility] [phone]

## 2023-10-25 NOTE — Addendum Note (Signed)
 Addended by: JOLINDA NORENE HERO on: 10/25/2023 04:41 PM   Modules accepted: Orders

## 2023-10-25 NOTE — Telephone Encounter (Signed)
 Copied from CRM [id number]. Topic: Clinical - Medical Advice >> Oct 25, 2023  3:12 PM Sasha H wrote: Reason for CRM: Pt was seen this morning and states that Dr.Gottschalk mentioned Physical therapy and pt states she wants to do it and thinks it will be a good thing for her to do.

## 2023-10-26 ENCOUNTER — Telehealth: Payer: Self-pay

## 2023-10-26 LAB — MICROALBUMIN / CREATININE URINE RATIO
Creatinine, Urine: 124.1 mg/dL
Microalb/Creat Ratio: 25 mg/g{creat} (ref 0–29)
Microalbumin, Urine: 31.5 ug/mL

## 2023-10-26 LAB — LIPID PANEL
Chol/HDL Ratio: 3.6 ratio (ref 0.0–4.4)
Cholesterol, Total: 141 mg/dL (ref 100–199)
HDL: 39 mg/dL — ABNORMAL LOW (ref >39)
LDL Chol Calc (NIH): 80 mg/dL (ref 0–99)
Triglycerides: 123 mg/dL (ref 0–149)
VLDL Cholesterol Cal: 22 mg/dL (ref 5–40)

## 2023-10-26 NOTE — Telephone Encounter (Signed)
 Order faxed to [facility] at [phone]

## 2023-10-26 NOTE — Telephone Encounter (Signed)
 Will give to cathy to send over.

## 2023-10-26 NOTE — Telephone Encounter (Signed)
 Pt called and states that she needs CPAP supplies sent to [facility]. Pt needs everything except the CPAP machine itself. Can we send over this order?  Pt seen in office yesterday. LS

## 2023-10-26 NOTE — Addendum Note (Signed)
 Addended by: JOLINDA NORENE HERO on: 10/26/2023 03:14 PM   Modules accepted: Orders

## 2023-10-26 NOTE — Telephone Encounter (Signed)
 Copied from CRM [id number]. Topic: General - Other >> Oct 26, 2023 11:26 AM Essie A wrote: Reason for CRM: Patient would like for Dr. Carney nurse to call her regarding her CPAP supplies.  Her phone number is [phone].

## 2023-10-26 NOTE — Telephone Encounter (Signed)
 Message sent to provider in a separate encounter. Ls

## 2023-10-26 NOTE — Telephone Encounter (Signed)
 Lmtcb 7/8 lS

## 2023-10-31 ENCOUNTER — Emergency Department (HOSPITAL_COMMUNITY): Admission: EM | Admit: 2023-10-31 | Discharge: 2023-10-31 | Disposition: A | Discharge status: Home / Self Care

## 2023-10-31 ENCOUNTER — Encounter (HOSPITAL_COMMUNITY): Payer: Self-pay | Admitting: *Deleted

## 2023-10-31 ENCOUNTER — Other Ambulatory Visit: Payer: Self-pay

## 2023-10-31 ENCOUNTER — Emergency Department (HOSPITAL_COMMUNITY)

## 2023-10-31 DIAGNOSIS — I10 Essential (primary) hypertension: Secondary | ICD-10-CM | POA: Diagnosis not present

## 2023-10-31 DIAGNOSIS — E119 Type 2 diabetes mellitus without complications: Secondary | ICD-10-CM | POA: Insufficient documentation

## 2023-10-31 DIAGNOSIS — Z7984 Long term (current) use of oral hypoglycemic drugs: Secondary | ICD-10-CM | POA: Insufficient documentation

## 2023-10-31 DIAGNOSIS — E871 Hypo-osmolality and hyponatremia: Secondary | ICD-10-CM | POA: Diagnosis not present

## 2023-10-31 DIAGNOSIS — N3000 Acute cystitis without hematuria: Secondary | ICD-10-CM | POA: Diagnosis not present

## 2023-10-31 DIAGNOSIS — R200 Anesthesia of skin: Secondary | ICD-10-CM | POA: Diagnosis present

## 2023-10-31 LAB — URINALYSIS, ROUTINE W REFLEX MICROSCOPIC
Bilirubin Urine: NEGATIVE
Glucose, UA: NEGATIVE mg/dL
Hgb urine dipstick: NEGATIVE
Ketones, ur: NEGATIVE mg/dL
Leukocytes,Ua: NEGATIVE
Nitrite: POSITIVE — AB
Protein, ur: NEGATIVE mg/dL
Specific Gravity, Urine: 1.016 (ref 1.005–1.030)
pH: 7 (ref 5.0–8.0)

## 2023-10-31 LAB — COMPREHENSIVE METABOLIC PANEL WITH GFR
ALT: 17 U/L (ref 0–44)
AST: 22 U/L (ref 15–41)
Albumin: 3.8 g/dL (ref 3.5–5.0)
Alkaline Phosphatase: 92 U/L (ref 38–126)
Anion gap: 12 (ref 5–15)
BUN: 14 mg/dL (ref 8–23)
CO2: 22 mmol/L (ref 22–32)
Calcium: 9.6 mg/dL (ref 8.9–10.3)
Chloride: 96 mmol/L — ABNORMAL LOW (ref 98–111)
Creatinine, Ser: 0.62 mg/dL (ref 0.44–1.00)
GFR, Estimated: >60 mL/min (ref >60)
Glucose, Bld: 98 mg/dL (ref 70–99)
Potassium: 3.5 mmol/L (ref 3.5–5.1)
Sodium: 130 mmol/L — ABNORMAL LOW (ref 135–145)
Total Bilirubin: 0.6 mg/dL (ref 0.0–1.2)
Total Protein: 7.4 g/dL (ref 6.5–8.1)

## 2023-10-31 LAB — CBC WITH DIFFERENTIAL/PLATELET
Abs Immature Granulocytes: 0.02 K/uL (ref 0.00–0.07)
Basophils Absolute: 0.1 K/uL (ref 0.0–0.1)
Basophils Relative: 1 %
Eosinophils Absolute: 0.1 K/uL (ref 0.0–0.5)
Eosinophils Relative: 1 %
HCT: 40.3 % (ref 36.0–46.0)
Hemoglobin: 13.6 g/dL (ref 12.0–15.0)
Immature Granulocytes: 0 %
Lymphocytes Relative: 19 %
Lymphs Abs: 1.4 K/uL (ref 0.7–4.0)
MCH: 29.3 pg (ref 26.0–34.0)
MCHC: 33.7 g/dL (ref 30.0–36.0)
MCV: 86.9 fL (ref 80.0–100.0)
Monocytes Absolute: 0.6 K/uL (ref 0.1–1.0)
Monocytes Relative: 8 %
Neutro Abs: 5.3 K/uL (ref 1.7–7.7)
Neutrophils Relative %: 71 %
Platelets: 323 K/uL (ref 150–400)
RBC: 4.64 MIL/uL (ref 3.87–5.11)
RDW: 14.2 % (ref 11.5–15.5)
WBC: 7.5 K/uL (ref 4.0–10.5)
nRBC: 0 % (ref 0.0–0.2)

## 2023-10-31 MED ORDER — CEPHALEXIN 500 MG PO CAPS: 500.0000 mg | ORAL_CAPSULE | Freq: Two times a day (BID) | ORAL | 0 refills | Status: AC

## 2023-10-31 MED ORDER — SODIUM CHLORIDE 0.9 % IV BOLUS
1000.0000 mL | Freq: Once | INTRAVENOUS | Status: AC
  Administered 2023-10-31: 1000 mL via INTRAVENOUS

## 2023-10-31 MED ORDER — CEPHALEXIN 500 MG PO CAPS
500.0000 mg | ORAL_CAPSULE | Freq: Once | ORAL | Status: AC
  Administered 2023-10-31: 500 mg via ORAL
  Filled 2023-10-31: qty 1

## 2023-10-31 NOTE — ED Provider Notes (Cosign Needed Addendum)
 [facility] AT [facility] Provider Note   CSN: [id number] Arrival date & time: 10/31/23  1040     Patient presents with: No chief complaint on file.   Jennifer [name] is a 73 y.o. female.  She has history of bilateral carpal tunnel syndrome, hypertension, diabetes, morbid obesity, high cholesterol, sleep apnea.  She presents the ER today for evaluation of generalized weakness, states this is been ongoing for the past couple of weeks.  She states that due to feeling weak she has had to switch from using a cane to a walker.  Her son also put a bedside commode in her room to decrease the distance she needs to go to get to the bathroom at night.  She also reports some numbness and tingling in her bilateral hands, she did have this for a long time but it is more intense.  States her blood sugars have been running normal.  She notes that the change in her medications is that she started Ozempic 2 months ago and is wondering if this is possible the cause.  Does note that she went to her PCP about 3 weeks ago and was told her sodium was slightly low, she states they did not give her any medications or recommendations about this.  She notes that she has had some burning with urination just the past day.    HPI     Prior to Admission medications   Medication Sig Start Date End Date Taking? Authorizing Provider  acetaminophen  (TYLENOL ) 500 MG tablet Take 1,000 mg by mouth 2 (two) times daily as needed for mild pain or moderate pain.     [provider]  atenolol  (TENORMIN ) 25 MG tablet Take 1 tablet (25 mg total) by mouth daily. 09/28/22   Jolinda Norene HERO, DO  atorvastatin  (LIPITOR) 80 MG tablet Take 1 tablet (80 mg total) by mouth at bedtime. 05/25/23   Jolinda Norene HERO, DO  budeson-glycopyrrolate -formoterol (BREZTRI  AEROSPHERE) 160-9-4.8 MCG/ACT AERO Inhale 2 puffs into the lungs 2 (two) times daily. Rinse mouth out after 06/29/23   Jolinda Norene HERO,  DO  calcium  carbonate (TUMS - DOSED IN MG ELEMENTAL CALCIUM ) 500 MG chewable tablet Chew 1,000 mg by mouth daily as needed for indigestion or heartburn.    [provider]  Coenzyme Q-10 100 MG capsule Take 2 capsules (200 mg total) by mouth daily. 09/21/17   Court Dorn PARAS, MD  diclofenac  Sodium (VOLTAREN ) 1 % GEL Apply 2 g topically 4 (four) times daily. 08/31/23   [facility] Sebastian Pool, NP  fenofibrate  (TRICOR ) 48 MG tablet TAKE 1 TABLET BY MOUTH ONCE DAILY FOR  TRIGLYCERIDES 10/21/23   Jolinda Norene M, DO  meloxicam  (MOBIC ) 15 MG tablet Take 1 tablet by mouth once daily 08/23/23   Evans, Brent M, DPM  metFORMIN  (GLUCOPHAGE ) 500 MG tablet Take 1 tablet (500 mg total) by mouth daily with breakfast. 09/28/22   Jolinda Norene HERO, DO  Multiple Vitamins-Minerals (ADULT GUMMY PO) Take 1 capsule by mouth daily. Patient not taking: Reported on 10/25/2023    [provider]  polyethylene glycol (MIRALAX  / GLYCOLAX ) 17 g packet Take 17 g by mouth daily as needed.    [provider]  Semaglutide,0.25 or 0.5MG /DOS, (OZEMPIC, 0.25 OR 0.5 MG/DOSE,) 2 MG/1.5ML SOPN Inject 0.5 mg into the skin once a week.    [provider]  tiZANidine  (ZANAFLEX ) 4 MG tablet TAKE 1 TABLET BY MOUTH EVERY 8 HOURS AS NEEDED FOR MUSCLE  SPASM 09/14/23   Jolinda Potter M, DO  triamterene -hydrochlorothiazide  (MAXZIDE -25) 37.5-25 MG tablet Take 1 tablet by mouth daily. 09/28/22   Jolinda Potter HERO, DO  Vitamin D , Ergocalciferol , (DRISDOL ) 1.25 MG (50000 UNIT) CAPS capsule Take 1 capsule (50,000 Units total) by mouth every 7 (seven) days. Then follow up for vit d recheck 04/28/23   Jolinda Potter HERO, DO    Allergies: Oxycodone  and Hydrocodone     Review of Systems  Updated Vital Signs BP 122/80 (BP Location: Left Arm)   Pulse 85   Temp 99 F (37.2 C) (Oral)   Resp 16   Ht 5' 4 (1.626 m)   Wt 98.4 kg   SpO2 97%   BMI 37.25 kg/m   Physical Exam Vitals and nursing note reviewed.   Constitutional:      General: She is not in acute distress.    Appearance: She is well-developed.  HENT:     Head: Normocephalic and atraumatic.     Mouth/Throat:     Mouth: Mucous membranes are moist.  Eyes:     Extraocular Movements: Extraocular movements intact.     Conjunctiva/sclera: Conjunctivae normal.     Pupils: Pupils are equal, round, and reactive to light.  Cardiovascular:     Rate and Rhythm: Normal rate and regular rhythm.     Heart sounds: No murmur heard. Pulmonary:     Effort: Pulmonary effort is normal. No respiratory distress.     Breath sounds: Normal breath sounds.  Abdominal:     Palpations: Abdomen is soft.     Tenderness: There is no abdominal tenderness.  Musculoskeletal:        General: No swelling. Normal range of motion.     Cervical back: Neck supple.  Skin:    General: Skin is warm and dry.     Capillary Refill: Capillary refill takes less than 2 seconds.  Neurological:     General: No focal deficit present.     Mental Status: She is alert and oriented to person, place, and time.  Psychiatric:        Mood and Affect: Mood normal.     (all labs ordered are listed, but only abnormal results are displayed) Labs Reviewed  COMPREHENSIVE METABOLIC PANEL WITH GFR - Abnormal; Notable for the following components:      Result Value   Sodium 130 (*)    Chloride 96 (*)    All other components within normal limits  URINALYSIS, ROUTINE W REFLEX MICROSCOPIC - Abnormal; Notable for the following components:   APPearance HAZY (*)    Nitrite POSITIVE (*)    Bacteria, UA FEW (*)    All other components within normal limits  CBC WITH DIFFERENTIAL/PLATELET    EKG: None  Radiology: DG Chest Portable 1 View Result Date: 10/31/2023 CLINICAL DATA:  Malaise and weakness. EXAM: PORTABLE CHEST 1 VIEW COMPARISON:  02/02/2014. FINDINGS: The heart is enlarged the mediastinal contour is within normal limits. There is atherosclerotic calcification of the aorta.  Interstitial prominence is present bilaterally which is likely chronic. No consolidation, effusion, or pneumothorax. No acute osseous abnormality IMPRESSION: No active disease. Electronically Signed   By: Leita Birmingham M.D.   On: 10/31/2023 12:05     Procedures   Medications Ordered in the ED  cephALEXin  (KEFLEX ) capsule 500 mg (has no administration in time range)  sodium chloride  0.9 % bolus 1,000 mL (1,000 mLs Intravenous New Bag/Given 10/31/23 1353)  Medical Decision Making This patient presents to the ED for concern of generalized weakness that has been gradual in onset over the past several weeks, this involves an extensive number of treatment options, and is a complaint that carries with it a high risk of complications and morbidity.  The differential diagnosis includes dehydration, electrolyte derangement,    Co morbidities that complicate the patient evaluation :   Hypertension, diabetes, obesity, carpal tunnel syndrome   Additional history obtained:  Additional history obtained from EMR External records from outside source obtained and reviewed including previous notes and labs are recent PCP visit   Lab Tests:  I Ordered, and personally interpreted labs.  The pertinent results include: Sodium 130 and chloride 96, CBC normal, UA with positive nitrite, 6-10 white blood cells and few bacteria   Imaging Studies ordered:  I ordered imaging studies including chest x-ray which shows no pulmonary edema, infiltrate, or pneumothorax I independently visualized and interpreted imaging within scope of identifying emergent findings  I agree with the radiologist interpretation    Problem List / ED Course / Critical interventions / Medication management  Generalized weakness-patient has no focal weakness on exam, no other neurologic complaints or focal findings on exam.  Her sodium is slightly low at 130, she is HCTZ, possibly related to this mild  dehydration.  She not appear to be volume overloaded.  She was given IV fluids and is feeling better. She does not complaining of some dysuria, UA is positive, only has 16 white blood cells but states symptoms just darted so we will treat with Keflex .  She has no abdominal pain, no flank pain, do not feel he needs any imaging for this.  Not concerned about a kidney stone. Patient was able to ambulate with assistance in the ER.  She has reassuring vital signs.  We discussed increasing dietary sodium intake by salting her food or placing some of her water  intake with sugar-free Gatorade.  She should have a recheck this week with her PCP.  Bilateral hand numbness-this is diffuse and in a glove sensation, she has had this for a long time but states it is slightly worse.  She does have diabetes and also carries diagnosis of bilateral carpal tunnel, again she does not have any weakness in her hands, no skin changes, advised on PCP follow-up for this.   I ordered medication including IV normal saline Reevaluation of the patient after these medicines showed that the patient improved I have reviewed the patients home medicines and have made adjustments as needed   Social Determinants of Health:  Patient lives independently      Amount and/or Complexity of Data Reviewed Labs: ordered. Radiology: ordered.  Risk Prescription drug management.        Final diagnoses:  Hyponatremia  Acute cystitis without hematuria    ED Discharge Orders     None          Suellen Sherran LABOR, PA-C 10/31/23 1546    Suellen Sherran LABOR, PA-C 10/31/23 1609    Kammerer, Duwaine L, DO 11/03/23 [phone]

## 2023-10-31 NOTE — Discharge Instructions (Addendum)
 You are seen today in the ER for generalized weakness.  Your sodium was slightly low.  We gave you some IV fluids.  You can either start putting salt on your food or replacing some of the water  you drink with 0 sugar Gatorade.  Follow-up this week with your PCP for recheck of your electrolytes.  In addition you have a urinary tract infection.  We are giving you a course of antibiotics.  Come back to the ER for new or worsening symptoms.

## 2023-10-31 NOTE — ED Triage Notes (Signed)
 Pt states she feels weak and states her mobility has been decling over the last couple of weeks; pt state she is usually able to do her ADL's but she states she is unsteady on her feet  Pt also c/o numbness and tingling to both hands  Pt c/o right leg pain that radiates around to her back

## 2023-11-01 ENCOUNTER — Telehealth: Payer: Self-pay

## 2023-11-01 ENCOUNTER — Ambulatory Visit: Admitting: Orthopaedic Surgery

## 2023-11-01 ENCOUNTER — Other Ambulatory Visit (INDEPENDENT_AMBULATORY_CARE_PROVIDER_SITE_OTHER)

## 2023-11-01 DIAGNOSIS — M1712 Unilateral primary osteoarthritis, left knee: Secondary | ICD-10-CM | POA: Diagnosis not present

## 2023-11-01 DIAGNOSIS — Z96641 Presence of right artificial hip joint: Secondary | ICD-10-CM

## 2023-11-01 MED ORDER — SODIUM HYALURONATE 60 MG/3ML IX PRSY
60.0000 mg | PREFILLED_SYRINGE | INTRA_ARTICULAR | Status: AC | PRN
  Administered 2023-11-01: 60 mg via INTRA_ARTICULAR

## 2023-11-01 NOTE — Telephone Encounter (Signed)
 VOB submitted for Durolane, left knee.

## 2023-11-01 NOTE — Progress Notes (Signed)
 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  One of our colleagues in town replaced her right knee back in 2015.  She was just in the hospital yesterday in the ER due to significant deconditioning and significant pain all over.  All of her labs seem to be stable and they recommended follow-up with her primary care physician.  She says she still cannot sit well and feels like she has balance issues.  She has known arthritis in her left knee and we saw her in December 2024 and recommended hyaluronic acid for the left knee.  We did see that her order was submitted but then there were no other follow-up notes.  She is requesting an injection such as that again.  Her right hip moves smoothly and fluidly as does her right knee.  An AP pelvis and lateral the right hip shows a well-seated right total hip arthroplasty with no complicating features.  Previous x-rays of the left knee shows tricompartment arthritis.  She does need a hyaluronic acid injection for her left knee if that can be approved through insurance.  She has tried and failed other conservative treatment measures.  She would also benefit from physical therapy to strengthen her back and her bilateral lower extremities.  She did let me know that she actually starts physical therapy later this week.  We then got her approved for hyaluronic acid injection and we are to place that in her left knee without difficulty.  Follow-up can be as needed.     Procedure Note  Patient: Jennifer [name]             Date of Birth: 1951/01/17           MRN: [id number]             Visit Date: 11/01/2023  Procedures: Visit Diagnoses:  1. History of right hip replacement   2. Primary osteoarthritis of left knee     Large Joint Inj: L knee on 11/01/2023 1:28 PM Indications: diagnostic evaluation and pain Details: 22 G 1.5 in needle, superolateral approach  Arthrogram:  No  Medications: 60 mg Sodium Hyaluronate 60 MG/3ML Outcome: tolerated well, no immediate complications Procedure, treatment alternatives, risks and benefits explained, specific risks discussed. Consent was given by the patient. Immediately prior to procedure a time out was called to verify the correct patient, procedure, equipment, support staff and site/side marked as required. Patient was prepped and draped in the usual sterile fashion.    Lot number: [id number]

## 2023-11-02 ENCOUNTER — Other Ambulatory Visit: Payer: Self-pay

## 2023-11-02 DIAGNOSIS — M1712 Unilateral primary osteoarthritis, left knee: Secondary | ICD-10-CM

## 2023-11-02 NOTE — Therapy (Signed)
 OUTPATIENT PHYSICAL THERAPY EVALUATION Date of referral: 10/25/23 Referring provider: Jolinda Norene HERO, DO  Referring diagnosis?  M25.551 (ICD-10-CM) - Right hip pain  M25.511,G89.29,M25.512 (ICD-10-CM) - Chronic pain of both shoulders   Treatment diagnosis? (if different than referring diagnosis) m26.81  What was this (referring dx) caused by? Surgery (Type: Rt THA 2022) and Ongoing Issue: deconditioning and chronic pain  Nature of Condition: Chronic with acute flare up   Laterality: Both  Current Functional Measure Score: Patient Specific Functional Scale 4.33/10  Objective measurements identify impairments when they are compared to normal values, the uninvolved extremity, and prior level of function.  [x]  Yes  []  No  Objective assessment of functional ability: Severe functional limitations   Briefly describe symptoms: chronic in nature, but recently became deconditioned and weak  How did symptoms start: insidious onset  Average pain intensity:  Last 24 hours: 10  Past week: 10  How often does the pt experience symptoms? Constantly  How much have the symptoms interfered with usual daily activities? Extremely  How has condition changed since care began at this facility? NA - initial visit  In general, how is the patients overall health? Fair   BACK PAIN (STarT Back Screening Tool) No    Patient Name: Jennifer [name] MRN: [id number] DOB:11/01/1950, 73 y.o., female Today's Date: 11/03/2023  END OF SESSION:  PT End of Session - 11/03/23 1344     Visit Number 1    Number of Visits 12    Date for PT Re-Evaluation 12/15/23    Authorization Type [employer] MCR    Progress Note Due on Visit 10    PT Start Time 1300    PT Stop Time 1345    PT Time Calculation (min) 45 min    Activity Tolerance Patient limited by pain;Patient limited by fatigue    Behavior During Therapy [facility] for tasks assessed/performed          Past Medical History:  Diagnosis Date   Arthritis     Dyspnea    Hyperlipidemia    Hypertension    Pre-diabetes    Sleep apnea    Past Surgical History:  Procedure Laterality Date   ABDOMINAL HYSTERECTOMY     COLONOSCOPY N/A 11/25/2016   Procedure: COLONOSCOPY;  Surgeon: Golda Claudis PENNER, MD;  Location: [facility];  Service: Endoscopy;  Laterality: N/A;  830   EYE SURGERY     FOOT SURGERY Left 2018   reconstructed tendon   KNEE ARTHROSCOPY     BIL   2010    LUNG BIOPSY     RIGHT 30 YRS AGO    TOTAL HIP ARTHROPLASTY Right 12/17/2020   Procedure: RIGHT TOTAL HIP ARTHROPLASTY ANTERIOR APPROACH;  Surgeon: Vernetta Lonni GRADE, MD;  Location: [facility] OR;  Service: Orthopedics;  Laterality: Right;   TOTAL KNEE ARTHROPLASTY Right 02/12/2014   Procedure: TOTAL KNEE ARTHROPLASTY;  Surgeon: Marcey Raman, MD;  Location: [facility] OR;  Service: Orthopedics;  Laterality: Right;   Patient Active Problem List   Diagnosis Date Noted   Moderate recurrent major depression (HCC) 07/29/2021   Status post total replacement of right hip 12/17/2020   Hip pain 07/24/2020   OSA on CPAP 04/04/2019   Peripheral arterial disease (HCC) 09/21/2017   Type 2 diabetes, with peripheral circulatory disorder not at goal [facility]) 08/16/2017   Chronic pain of left ankle 08/11/2017   Bilateral carpal tunnel syndrome 08/11/2017   Hypertension associated with diabetes (HCC) 08/11/2017   Morbid obesity (HCC) 08/11/2017  Localized primary osteoarthritis of left lower leg 08/11/2017   Hyperlipidemia associated with type 2 diabetes mellitus (HCC) 08/11/2017   Tobacco use disorder 08/11/2017   History of adenomatous polyp of colon 07/23/2016   S/P total knee arthroplasty 02/12/2014    PCP: Jolinda Norene HERO, DO   REFERRING PROVIDER: Jolinda Norene HERO, DO   REFERRING DIAG:  [phone] (ICD-10-CM) - Right hip pain  M25.511,G89.29,M25.512 (ICD-10-CM) - Chronic pain of both shoulders    Rationale for Evaluation and Treatment:  Rehabiliation  THERAPY DIAG:  Muscle  weakness (generalized)  Chronic right shoulder pain  Chronic left shoulder pain  Pain in right hip  ONSET DATE: Chronic pain, Rt THA 2022   SUBJECTIVE:                                                                                                                                                                                           SUBJECTIVE STATEMENT: Recent hospitilization 10/31/23 due to deconditioning and pain all over. She had previous hip surgery in 2022. She has started to notice gait unsteadiness so started using cane and now has been using RW for last 2 weeks. Her chief complaint is weakness and pain. She also endorses shoulder pain and can not reach. She does relays she was found to have hyponatremia at hospital.   PERTINENT HISTORY:  See above PMH, had a right hip replacement back in August 2022   PAIN:  NPRS scale: 10/10 upon arrival Pain location:low back and right hip Pain description: constant, stinging Aggravating factors: sitting, standing, walking Relieving factors: rest, meds   PRECAUTIONS: ,  None  RED FLAGS: None   WEIGHT BEARING RESTRICTIONS:  No  FALLS:  Has patient fallen in last 6 months? No, but does report poor balance   PLOF:  Independent with basic ADLs, having trouble stepping over tub  PATIENT GOALS:  Reduce pain, get strength back  OBJECTIVE:  Note: Objective measures were completed at Evaluation unless otherwise noted.  DIAGNOSTIC FINDINGS:  An [facility] pelvis and lateral the right hip shows a well-seated right total hip arthroplasty with no complicating features. Previous x-rays of the left knee shows tricompartment arthritis.   PATIENT SURVEYS:  Patient-Specific Activity Scoring Scheme  0 represents "unable to perform." 10 represents "able to perform at prior level. 0 1 2 3 4 5 6 7 8 9  10 (Date and Score)   Activity Eval     1. Cooking 4     2. walking 5    3. Getting up from bed or chair 4   4.    5.    Score  4.33/10  Total score = sum of the activity scores/number of activities Minimum detectable change (90%CI) for average score = 2 points Minimum detectable change (90%CI) for single activity score = 3 points  POSTURE:  Slumped posture  GAIT:  Eval Distance walked: 25 feet X 2 Assistive device utilized: Walker - 2 wheeled Level of assistance: SBA Comments: slow speed, fatigues easily   UPPER EXTREMITY ROM:  AROM/PROM Right eval Left eval  Shoulder flexion 60/100 30/100  Shoulder extension    Shoulder abduction 60 with elbows bent/90 60 with elbows bent/90  Shoulder adduction    Shoulder extension 30 25  Shoulder internal rotation    Shoulder external rotation    Elbow flexion    Elbow extension    Wrist flexion    Wrist extension    Wrist ulnar deviation    Wrist radial deviation    Wrist pronation    Wrist supination     (Blank rows = not tested)   UPPER EXTREMITY MMT:  MMT Right eval Left eval  Shoulder flexion 2 2  Shoulder extension    Shoulder abduction 2 2  Shoulder adduction    Shoulder extension    Shoulder internal rotation 2 2  Shoulder external rotation 2 2  Middle trapezius    Lower trapezius    Elbow flexion 4 4  Elbow extension 4 4  Wrist flexion    Wrist extension    Wrist ulnar deviation    Wrist radial deviation    Wrist pronation    Wrist supination    Grip strength     (Blank rows = not tested)      LOWER EXTREMITY MMT:    MMT Right eval Left eval  Hip flexion 3 3  Hip extension    Hip abduction    Hip adduction    Hip internal rotation    Hip external rotation    Knee flexion 3 3  Knee extension 3 3  Ankle dorsiflexion 3 3  Ankle plantarflexion    Ankle inversion    Ankle eversion     (Blank rows = not tested)  FUNCTIONAL TESTS:  Timed up and go (TUG): 40.5 seconds                                                                                                                              TREATMENT DATE:   Eval HEP creation and review with demonstration and trial set preformed, see below for details Selfcare: recommendations for hydration and to drink electrolyte beverage that has sodium to help with her hyponatremia. Importance of HEP to build strength and endurance to improve function    PATIENT EDUCATION: Education details: HEP, PT plan of care, selfcare Person educated: Patient Education method: Explanation, Demonstration, Verbal cues, and Handouts Education comprehension: verbalized understanding, further education recommended   HOME EXERCISE PROGRAM: Access Code: [email or web address] URL: [email or web address] Date: 11/03/2023 Prepared by: Redell Moose  Exercises - Supine Shoulder Flexion Extension MARCILLE  with Dowel  - 2 x daily - 6 x weekly - 1 sets - 10 reps - 5 sec hold - Seated Shoulder External Rotation AAROM with Dowel  - 2 x daily - 6 x weekly - 1 sets - 10 reps - 5 sec hold - Seated Bilateral Shoulder Flexion Towel Slide at Table Top  - 2 x daily - 6 x weekly - 1 sets - 10 reps - 5 sec hold - Seated Shoulder Abduction Towel Slide at Table Top with Forearm in Neutral  - 4-5 x daily - 6 x weekly - 1 sets - 10 reps - 5 hold - Seated Long Arc Quad  - 2 x daily - 6 x weekly - 2 sets - 10 reps - alternating marching  - 2 x daily - 6 x weekly - 1-2 sets - 10 reps - Standing Hip Abduction with Counter Support  - 2 x daily - 6 x weekly - 1-2 sets - 10 reps  ASSESSMENT:  CLINICAL IMPRESSION: Patient referred to PT for Bilateral shoulder pain and stiffness (concern for adhesive capsulitis) as well as Right hip pain that could be lumbar radiculpathy. She had a right hip replacement back in August 2022. Recent hospitilization 10/31/23 due to deconditioning and pain all over. She remains deconditioned and has generalized weakness.Patient will benefit from skilled PT to improve overall function and to address impairments and limitations listed below.  OBJECTIVE IMPAIRMENTS: decreased  activity tolerance for ADL's, difficulty walking, decreased balance, decreased endurance, decreased mobility, decreased ROM, decreased strength, impaired flexibility, impaired UE/LE use, and pain.  ACTIVITY LIMITATIONS: bending, lifting, carry, reaching, locomotion, cleaning, community activity, driving, and or occupation  PERSONAL FACTORS: see above PMH  also affecting patient's functional outcome.  REHAB POTENTIAL: Fair    CLINICAL DECISION MAKING: Evolving/moderate complexity  EVALUATION COMPLEXITY: Moderate    GOALS: Short term PT Goals Target date: 12/01/2023   Pt will be I and compliant with HEP. Baseline:  Goal status: New Pt will decrease pain by 25% overall Baseline: Goal status: New  Long term PT goals Target date:12/15/2023  Pt will improve shoulder AROM to [facility] (at least 90 deg flexion and abd)  to improve functional mobility Baseline: Goal status: New Pt will improve  strength to at least 4+/5 MMT to improve functional strength Baseline: Goal status: New Pt will improve Patient specific functional scale (PSFS) to at least 7/10 to show improved function level Baseline:4.33 Goal status: New Pt will reduce pain to overall less than 3/10 with usual activity and walking >7min Baseline: Goal status: New Pt will improve Timed up and go (TUG) time to less than 20 seconds with LRAD to show improved gait speed and balance Baseline:40 sec Goal status: New  PLAN: PT FREQUENCY: 1-3 times per week   PT DURATION: 6-8 weeks  PLANNED INTERVENTIONS (unless contraindicated):  97110-Therapeutic exercises, 97530- Therapeutic activity, [id number]- Neuromuscular re-education, 97535- Self Care, 02859- Manual therapy, [id number]- Gait training, H9716- Electrical stimulation (unattended), [id number]- Ultrasound, 02987- Traction (mechanical), and 97033- Ionotophoresis 4mg /ml Dexamethasone  PLAN FOR NEXT SESSION: Nu step for endurance, work on shoulder ROM and UE strength, LE strength, and gait,  balance.  NEXT MD VISIT: nothing scheduled at eval  Redell JONELLE Moose, PT,DPT 11/03/2023, 1:48 PM

## 2023-11-03 ENCOUNTER — Telehealth: Payer: Self-pay

## 2023-11-03 ENCOUNTER — Ambulatory Visit: Attending: Family Medicine | Admitting: Physical Therapy

## 2023-11-03 ENCOUNTER — Encounter: Payer: Self-pay | Admitting: Physical Therapy

## 2023-11-03 DIAGNOSIS — M25512 Pain in left shoulder: Secondary | ICD-10-CM | POA: Diagnosis not present

## 2023-11-03 DIAGNOSIS — M25551 Pain in right hip: Secondary | ICD-10-CM | POA: Diagnosis not present

## 2023-11-03 DIAGNOSIS — M6281 Muscle weakness (generalized): Secondary | ICD-10-CM | POA: Insufficient documentation

## 2023-11-03 DIAGNOSIS — M25511 Pain in right shoulder: Secondary | ICD-10-CM | POA: Diagnosis not present

## 2023-11-03 DIAGNOSIS — G8929 Other chronic pain: Secondary | ICD-10-CM | POA: Diagnosis not present

## 2023-11-03 NOTE — Telephone Encounter (Signed)
 She said she was calling Dr Vernetta, her surgeon.

## 2023-11-03 NOTE — Telephone Encounter (Signed)
 Copied from CRM [id number]. Topic: General - Other >> Nov 03, 2023 11:47 AM Myrick T wrote: Reason for CRM: patient request for provider to send in the results from her last sleep study July July 7th office notes to [facility]. Patient says they need that info in order for her to get her CPAP supplies.

## 2023-11-03 NOTE — Telephone Encounter (Signed)
 Copied from CRM [id number]. Topic: Appointments - Appointment Scheduling >> Nov 03, 2023  9:05 AM Emylou G wrote: Please call patient.. she forgot to stop by [facility].. Is she needing xray, f/u appt, or labs.. she wasn't feeling well when she left on the 7th

## 2023-11-04 ENCOUNTER — Encounter: Payer: Self-pay | Admitting: Pharmacist

## 2023-11-04 NOTE — Telephone Encounter (Signed)
 LMOVM that sleep study and OV notes were faxed to [facility]

## 2023-11-04 NOTE — Telephone Encounter (Unsigned)
 Copied from CRM [id number]. Topic: General - Other >> Nov 04, 2023  1:31 PM Emylou G wrote: Reason for CRM: pls call patient she was sick.. forgot her ozempic shot.. is it okay to take now or wait til monday

## 2023-11-04 NOTE — Telephone Encounter (Signed)
 Ok to take now but she she reset her injection day to Thursdays going forward. She is NOT to inject on Monday if she injects today.

## 2023-11-04 NOTE — Telephone Encounter (Signed)
 Can you please send her PSGM results?

## 2023-11-04 NOTE — Telephone Encounter (Signed)
 Pt aware and verbalized understanding.

## 2023-11-05 ENCOUNTER — Encounter (INDEPENDENT_AMBULATORY_CARE_PROVIDER_SITE_OTHER): Payer: Self-pay | Admitting: *Deleted

## 2023-11-08 ENCOUNTER — Telehealth: Payer: Self-pay

## 2023-11-08 ENCOUNTER — Ambulatory Visit: Admitting: Physical Therapy

## 2023-11-08 ENCOUNTER — Encounter: Payer: Self-pay | Admitting: Physical Therapy

## 2023-11-08 DIAGNOSIS — M25511 Pain in right shoulder: Secondary | ICD-10-CM | POA: Diagnosis not present

## 2023-11-08 DIAGNOSIS — G8929 Other chronic pain: Secondary | ICD-10-CM | POA: Diagnosis not present

## 2023-11-08 DIAGNOSIS — Z122 Encounter for screening for malignant neoplasm of respiratory organs: Secondary | ICD-10-CM

## 2023-11-08 DIAGNOSIS — M6281 Muscle weakness (generalized): Secondary | ICD-10-CM

## 2023-11-08 DIAGNOSIS — F17210 Nicotine dependence, cigarettes, uncomplicated: Secondary | ICD-10-CM

## 2023-11-08 DIAGNOSIS — Z87891 Personal history of nicotine dependence: Secondary | ICD-10-CM

## 2023-11-08 DIAGNOSIS — M25551 Pain in right hip: Secondary | ICD-10-CM

## 2023-11-08 DIAGNOSIS — M25512 Pain in left shoulder: Secondary | ICD-10-CM | POA: Diagnosis not present

## 2023-11-08 NOTE — Telephone Encounter (Signed)
 Lung Cancer Screening Narrative/Criteria Questionnaire (Cigarette Smokers Only- No Cigars/Pipes/vapes)   Jennifer [name]   SDMV:11/11/2023 at 11:00 am with Leita        1950-06-22               LDCT: 11/18/2023 at 5:30 pm at [facility]    72 y.o.   Phone: [phone]  Lung Screening Narrative (confirm age 43-77 yrs Medicare / 50-80 yrs Private pay insurance)   Insurance information: [facility]   Referring Provider: Jolinda   This screening involves an initial phone call with a team member from our program. It is called a shared decision making visit. The initial meeting is required by  insurance and Medicare to make sure you understand the program. This appointment takes about 15-20 minutes to complete. You will complete the screening scan at your scheduled date/time.  This scan takes about 5-10 minutes to complete. You can eat and drink normally before and after the scan.  Criteria questions for Lung Cancer Screening:   Are you a current or former smoker? Current Age began smoking: 76   If you are a former smoker, what year did you quit smoking? Never quit (within 15 yrs)   To calculate your smoking history, I need an accurate estimate of how many packs of cigarettes you smoked per day and for how many years. (Not just the number of PPD you are now smoking)   Years smoking 52 x Packs per day 1.5 = Pack years 78   (at least 20 pack yrs)   (Make sure they understand that we need to know how much they have smoked in the past, not just the number of PPD they are smoking now)  Do you have a personal history of cancer?  No    Do you have a family history of cancer? No  Are you coughing up blood?  No  Have you had unexplained weight loss of 15 lbs or more in the last 6 months? No  It looks like you meet all criteria.  When would be a good time for us  to schedule you for this screening?   Additional information: N/A

## 2023-11-08 NOTE — Therapy (Signed)
 OUTPATIENT PHYSICAL THERAPY TREATMENT Date of referral: 10/25/23 Referring provider: Jolinda Norene HERO, DO  Referring diagnosis?  M25.551 (ICD-10-CM) - Right hip pain  M25.511,G89.29,M25.512 (ICD-10-CM) - Chronic pain of both shoulders   Treatment diagnosis? (if different than referring diagnosis) m26.81  What was this (referring dx) caused by? Surgery (Type: Rt THA 2022) and Ongoing Issue: deconditioning and chronic pain  Nature of Condition: Chronic with acute flare up   Laterality: Both  Current Functional Measure Score: Patient Specific Functional Scale 4.33/10  Objective measurements identify impairments when they are compared to normal values, the uninvolved extremity, and prior level of function.  [x]  Yes  []  No  Objective assessment of functional ability: Severe functional limitations   Briefly describe symptoms: chronic in nature, but recently became deconditioned and weak  How did symptoms start: insidious onset  Average pain intensity:  Last 24 hours: 10  Past week: 10  How often does the pt experience symptoms? Constantly  How much have the symptoms interfered with usual daily activities? Extremely  How has condition changed since care began at this facility? NA - initial visit  In general, how is the patients overall health? Fair   BACK PAIN (STarT Back Screening Tool) No    Patient Name: Jennifer [name] MRN: [id number] DOB:May 04, 1950, 73 y.o., female Today's Date: 11/08/2023  END OF SESSION:  PT End of Session - 11/08/23 1555     Visit Number 2    Number of Visits 12    Date for PT Re-Evaluation 12/15/23    Authorization Type [employer] MCR    Progress Note Due on Visit 10    PT Start Time 1545    PT Stop Time 1627    PT Time Calculation (min) 42 min    Activity Tolerance Patient limited by pain;Patient limited by fatigue    Behavior During Therapy [facility] for tasks assessed/performed          Past Medical History:  Diagnosis Date   Arthritis     Dyspnea    Hyperlipidemia    Hypertension    Pre-diabetes    Sleep apnea    Past Surgical History:  Procedure Laterality Date   ABDOMINAL HYSTERECTOMY     COLONOSCOPY N/A 11/25/2016   Procedure: COLONOSCOPY;  Surgeon: Golda Claudis PENNER, MD;  Location: [facility];  Service: Endoscopy;  Laterality: N/A;  830   EYE SURGERY     FOOT SURGERY Left 2018   reconstructed tendon   KNEE ARTHROSCOPY     BIL   2010    LUNG BIOPSY     RIGHT 30 YRS AGO    TOTAL HIP ARTHROPLASTY Right 12/17/2020   Procedure: RIGHT TOTAL HIP ARTHROPLASTY ANTERIOR APPROACH;  Surgeon: Vernetta Lonni GRADE, MD;  Location: [facility] OR;  Service: Orthopedics;  Laterality: Right;   TOTAL KNEE ARTHROPLASTY Right 02/12/2014   Procedure: TOTAL KNEE ARTHROPLASTY;  Surgeon: Marcey Raman, MD;  Location: [facility] OR;  Service: Orthopedics;  Laterality: Right;   Patient Active Problem List   Diagnosis Date Noted   Moderate recurrent major depression (HCC) 07/29/2021   Status post total replacement of right hip 12/17/2020   Hip pain 07/24/2020   OSA on CPAP 04/04/2019   Peripheral arterial disease (HCC) 09/21/2017   Type 2 diabetes, with peripheral circulatory disorder not at goal [facility]) 08/16/2017   Chronic pain of left ankle 08/11/2017   Bilateral carpal tunnel syndrome 08/11/2017   Hypertension associated with diabetes (HCC) 08/11/2017   Morbid obesity (HCC) 08/11/2017  Localized primary osteoarthritis of left lower leg 08/11/2017   Hyperlipidemia associated with type 2 diabetes mellitus (HCC) 08/11/2017   Tobacco use disorder 08/11/2017   History of adenomatous polyp of colon 07/23/2016   S/P total knee arthroplasty 02/12/2014    PCP: Jolinda Norene HERO, DO   REFERRING PROVIDER: Jolinda Norene HERO, DO   REFERRING DIAG:  [phone] (ICD-10-CM) - Right hip pain  M25.511,G89.29,M25.512 (ICD-10-CM) - Chronic pain of both shoulders    Rationale for Evaluation and Treatment:  Rehabiliation  THERAPY DIAG:  Muscle  weakness (generalized)  Chronic right shoulder pain  Chronic left shoulder pain  Pain in right hip  ONSET DATE: Chronic pain, Rt THA 2022   SUBJECTIVE:                                                                                                                                                                                           SUBJECTIVE STATEMENT: She feels tired today  PERTINENT HISTORY:  See above PMH, had a right hip replacement back in August 2022   PAIN:  NPRS scale: 9/10 upon arrival Pain location:low back and right hip Pain description: constant, stinging Aggravating factors: sitting, standing, walking Relieving factors: rest, meds   PRECAUTIONS: ,  None  RED FLAGS: None   WEIGHT BEARING RESTRICTIONS:  No  FALLS:  Has patient fallen in last 6 months? No, but does report poor balance   PLOF:  Independent with basic ADLs, having trouble stepping over tub  PATIENT GOALS:  Reduce pain, get strength back  OBJECTIVE:  Note: Objective measures were completed at Evaluation unless otherwise noted.  DIAGNOSTIC FINDINGS:  An AP pelvis and lateral the right hip shows a well-seated right total hip arthroplasty with no complicating features. Previous x-rays of the left knee shows tricompartment arthritis.   PATIENT SURVEYS:  Patient-Specific Activity Scoring Scheme  0 represents "unable to perform." 10 represents "able to perform at prior level. 0 1 2 3 4 5 6 7 8 9  10 (Date and Score)   Activity Eval     1. Cooking 4     2. walking 5    3. Getting up from bed or chair 4   4.    5.    Score 4.33/10    Total score = sum of the activity scores/number of activities Minimum detectable change (90%CI) for average score = 2 points Minimum detectable change (90%CI) for single activity score = 3 points  POSTURE:  Slumped posture  GAIT:  Eval Distance walked: 25 feet X 2 Assistive device utilized: Walker - 2 wheeled Level of assistance:  SBA Comments: slow speed, fatigues  easily   UPPER EXTREMITY ROM:  AROM/PROM Right eval Left eval  Shoulder flexion 60/100 30/100  Shoulder extension    Shoulder abduction 60 with elbows bent/90 60 with elbows bent/90  Shoulder adduction    Shoulder extension 30 25  Shoulder internal rotation    Shoulder external rotation    Elbow flexion    Elbow extension    Wrist flexion    Wrist extension    Wrist ulnar deviation    Wrist radial deviation    Wrist pronation    Wrist supination     (Blank rows = not tested)   UPPER EXTREMITY MMT:  MMT Right eval Left eval  Shoulder flexion 2 2  Shoulder extension    Shoulder abduction 2 2  Shoulder adduction    Shoulder extension    Shoulder internal rotation 2 2  Shoulder external rotation 2 2  Middle trapezius    Lower trapezius    Elbow flexion 4 4  Elbow extension 4 4  Wrist flexion    Wrist extension    Wrist ulnar deviation    Wrist radial deviation    Wrist pronation    Wrist supination    Grip strength     (Blank rows = not tested)      LOWER EXTREMITY MMT:    MMT Right eval Left eval  Hip flexion 3 3  Hip extension    Hip abduction    Hip adduction    Hip internal rotation    Hip external rotation    Knee flexion 3 3  Knee extension 3 3  Ankle dorsiflexion 3 3  Ankle plantarflexion    Ankle inversion    Ankle eversion     (Blank rows = not tested)  FUNCTIONAL TESTS:  Timed up and go (TUG): 40.5 seconds                                                                                                                              TREATMENT DATE:  11/08/23 HEP review and education Nu step LE/UE L1 X 8 min for functional endurance and ROM, needed 2 rest breaks to complete 8 min Standing pulleys 1 min X 2 flexion AAROM for functional reaching Seated bilat alternating LAQ 1 min X 2 for leg strength Seated shoulder flexion AAROM with cane X 10 reps bilat  Standing hip marches with UE support on RW X  10 reps bilat Standing hip abduction with UE support on RW X 10 reps bilat UBE X 4 min for shoulder ROM and endurance, with one rest break required Ambulation 25 feet X 3 with RW and supervision    PATIENT EDUCATION: Education details: HEP, PT plan of care, selfcare Person educated: Patient Education method: Explanation, Demonstration, Verbal cues, and Handouts Education comprehension: verbalized understanding, further education recommended   HOME EXERCISE PROGRAM: Access Code: [email or web address] URL: [email or web address] Date: 11/03/2023 Prepared by: Redell Moose  Exercises - Supine Shoulder Flexion Extension  AAROM with Dowel  - 2 x daily - 6 x weekly - 1 sets - 10 reps - 5 sec hold - Seated Shoulder External Rotation AAROM with Dowel  - 2 x daily - 6 x weekly - 1 sets - 10 reps - 5 sec hold - Seated Bilateral Shoulder Flexion Towel Slide at Table Top  - 2 x daily - 6 x weekly - 1 sets - 10 reps - 5 sec hold - Seated Shoulder Abduction Towel Slide at Table Top with Forearm in Neutral  - 4-5 x daily - 6 x weekly - 1 sets - 10 reps - 5 hold - Seated Long Arc Quad  - 2 x daily - 6 x weekly - 2 sets - 10 reps - alternating marching  - 2 x daily - 6 x weekly - 1-2 sets - 10 reps - Standing Hip Abduction with Counter Support  - 2 x daily - 6 x weekly - 1-2 sets - 10 reps  ASSESSMENT:  CLINICAL IMPRESSION: Today she did show improved shoulder ROM which is promising as there was concern for adhesive capsulitis. Her activity tolerance is very limited for exercise, standing, and walking and she needs frequent rest breaks.   Per Eval: Patient referred to PT for Bilateral shoulder pain and stiffness (concern for adhesive capsulitis) as well as Right hip pain that could be lumbar radiculpathy. She had a right hip replacement back in August 2022. Recent hospitilization 10/31/23 due to deconditioning and pain all over. She remains deconditioned and has generalized weakness.Patient will  benefit from skilled PT to improve overall function and to address impairments and limitations listed below.  OBJECTIVE IMPAIRMENTS: decreased activity tolerance for ADL's, difficulty walking, decreased balance, decreased endurance, decreased mobility, decreased ROM, decreased strength, impaired flexibility, impaired UE/LE use, and pain.  ACTIVITY LIMITATIONS: bending, lifting, carry, reaching, locomotion, cleaning, community activity, driving, and or occupation  PERSONAL FACTORS: see above PMH  also affecting patient's functional outcome.  REHAB POTENTIAL: Fair    CLINICAL DECISION MAKING: Evolving/moderate complexity  EVALUATION COMPLEXITY: Moderate    GOALS: Short term PT Goals Target date: 12/01/2023   Pt will be I and compliant with HEP. Baseline:  Goal status: New Pt will decrease pain by 25% overall Baseline: Goal status: New  Long term PT goals Target date:12/15/2023  Pt will improve shoulder AROM to [facility] (at least 90 deg flexion and abd)  to improve functional mobility Baseline: Goal status: New Pt will improve  strength to at least 4+/5 MMT to improve functional strength Baseline: Goal status: New Pt will improve Patient specific functional scale (PSFS) to at least 7/10 to show improved function level Baseline:4.33 Goal status: New Pt will reduce pain to overall less than 3/10 with usual activity and walking >58min Baseline: Goal status: New Pt will improve Timed up and go (TUG) time to less than 20 seconds with LRAD to show improved gait speed and balance Baseline:40 sec Goal status: New  PLAN: PT FREQUENCY: 1-3 times per week   PT DURATION: 6-8 weeks  PLANNED INTERVENTIONS (unless contraindicated):  97110-Therapeutic exercises, 97530- Therapeutic activity, [id number]- Neuromuscular re-education, 97535- Self Care, 02859- Manual therapy, [id number]- Gait training, H9716- Electrical stimulation (unattended), [id number]- Ultrasound, 02987- Traction (mechanical), and 97033-  Ionotophoresis 4mg /ml Dexamethasone  PLAN FOR NEXT SESSION: Nu step for endurance, work on shoulder ROM and UE strength, LE strength, and gait, balance.  NEXT MD VISIT: nothing scheduled at eval  Redell JONELLE Moose, PT,DPT 11/08/2023, 4:34 PM

## 2023-11-10 ENCOUNTER — Ambulatory Visit: Admitting: Physical Therapy

## 2023-11-11 ENCOUNTER — Ambulatory Visit: Admitting: Physician Assistant

## 2023-11-11 ENCOUNTER — Encounter: Payer: Self-pay | Admitting: Physician Assistant

## 2023-11-11 ENCOUNTER — Ambulatory Visit: Admitting: Physical Therapy

## 2023-11-11 ENCOUNTER — Encounter: Payer: Self-pay | Admitting: Physical Therapy

## 2023-11-11 DIAGNOSIS — G8929 Other chronic pain: Secondary | ICD-10-CM | POA: Diagnosis not present

## 2023-11-11 DIAGNOSIS — M6281 Muscle weakness (generalized): Secondary | ICD-10-CM

## 2023-11-11 DIAGNOSIS — M25551 Pain in right hip: Secondary | ICD-10-CM

## 2023-11-11 DIAGNOSIS — M25511 Pain in right shoulder: Secondary | ICD-10-CM | POA: Diagnosis not present

## 2023-11-11 DIAGNOSIS — M25512 Pain in left shoulder: Secondary | ICD-10-CM | POA: Diagnosis not present

## 2023-11-11 DIAGNOSIS — F17210 Nicotine dependence, cigarettes, uncomplicated: Secondary | ICD-10-CM | POA: Diagnosis not present

## 2023-11-11 NOTE — Progress Notes (Signed)
 Virtual Visit via Telephone Note  I connected with Jonquil Stubbe on 11/11/23 at  11:10 AM by telephone and verified that I am speaking with the correct person using two identifiers.  Location: Patient: home Provider: working virtually from home   I discussed the limitations, risks, security and privacy concerns of performing an evaluation and management service by telephone and the availability of in person appointments. I also discussed with the patient that there may be a patient responsible charge related to this service. The patient expressed understanding and agreed to proceed.     Shared Decision Making Visit Lung Cancer Screening Program [phone])   Eligibility: Age 40 Pack Years Smoking History Calculation 56 (# packs/per year x # years smoked) Recent History of coughing up blood  No Unexplained weight loss? No ( >Than 15 pounds within the last 6 months ) Prior History Lung / other cancer No (Diagnosis within the last 5 years already requiring surveillance chest CT Scans). Smoking Status Current Smoker  Visit Components: Discussion included one or more decision making aids. Yes Discussion included risk/benefits of screening. Yes Discussion included potential follow up diagnostic testing for abnormal scans. Yes Discussion included meaning and risk of over diagnosis. Yes Discussion included meaning and risk of False Positives. Yes Discussion included meaning of total radiation exposure. Yes  Counseling Included: Importance of adherence to annual lung cancer LDCT screening. Yes Impact of comorbidities on ability to participate in the program. Yes Ability and willingness to under diagnostic treatment: Yes  Smoking Cessation Counseling: Current Smokers:  Discussed importance of smoking cessation. Yes Information about tobacco cessation classes and interventions provided to patient. Yes Symptomatic Patient. No Diagnosis Code: Tobacco Use Z72.0 Asymptomatic Patient  Yes  Counseling (Intermediate counseling: > three minutes counseling) H9563 Information about tobacco cessation classes and interventions provided to patient. Yes Written Order for Lung Cancer Screening with LDCT placed in Epic. Yes (CT Chest Lung Cancer Screening Low Dose W/O CM) [id number] Z12.2-Screening of respiratory organs Z87.891-Personal history of nicotine dependence   I have spent 25 minutes of face to face/ virtual visit  time with the patient discussing the risks and benefits of lung cancer screening. We discussed the above noted topics. We paused at intervals to allow for questions to be asked and answered to ensure understanding.We discussed that the single most powerful action that anyone can take to decrease their risk of developing lung cancer is to quit smoking.  We discussed options for tools to aid in quitting smoking including nicotine replacement therapy, non-nicotine medications, support groups, Quit Smart classes, and behavior modification. We discussed that often times setting smaller, more achievable goals, such as eliminating 1 cigarette a day for a week and then 2 cigarettes a day for a week can be helpful in slowly decreasing the number of cigarettes smoked. I provided  them  with smoking cessation  information  with contact information for community resources, classes, free nicotine replacement therapy, and access to mobile apps, text messaging, and on-line smoking cessation help. I have also provided  them  the office contact information in the event they have any questions. We discussed the time and location of the scan, and that either Karna Curly RN, Karna Doom, RN  or I will call / send a letter with the results within 24-72 hours of receiving them. The patient verbalized understanding of all of  the above and had no further questions upon leaving the office. They have my contact information in the event they have any further  questions.  I spent 3 minutes counseling  on smoking cessation and the health risks of continued tobacco abuse.  I explained to the patient that there has been a high incidence of coronary artery disease noted on these exams. I explained that this is a non-gated exam therefore degree or severity cannot be determined. This patient is on statin therapy. I have asked the patient to follow-up with their PCP regarding any incidental finding of coronary artery disease and management with diet or medication as their PCP  feels is clinically indicated. The patient verbalized understanding of the above and had no further questions upon completion of the visit.    Leita SAUNDERS Jayin Derousse, PA-C

## 2023-11-11 NOTE — Therapy (Signed)
 OUTPATIENT PHYSICAL THERAPY TREATMENT  Patient Name: Jennifer [name] MRN: [id number] DOB:06-Nov-1950, 73 y.o., female Today's Date: 11/11/2023  END OF SESSION:  PT End of Session - 11/11/23 1539     Visit Number 3    Number of Visits 12    Date for PT Re-Evaluation 12/15/23    Authorization Type [employer] MCR    Progress Note Due on Visit 10    PT Start Time 1505    PT Stop Time 1545    PT Time Calculation (min) 40 min    Activity Tolerance Patient limited by pain;Patient limited by fatigue    Behavior During Therapy [facility] for tasks assessed/performed          Past Medical History:  Diagnosis Date   Arthritis    Dyspnea    Hyperlipidemia    Hypertension    Pre-diabetes    Sleep apnea    Past Surgical History:  Procedure Laterality Date   ABDOMINAL HYSTERECTOMY     COLONOSCOPY N/A 11/25/2016   Procedure: COLONOSCOPY;  Surgeon: Golda Claudis PENNER, MD;  [facility]: [facility];  Service: Endoscopy;  Laterality: N/A;  830   EYE SURGERY     FOOT SURGERY Left 2018   reconstructed tendon   KNEE ARTHROSCOPY     BIL   2010    LUNG BIOPSY     RIGHT 30 YRS AGO    TOTAL HIP ARTHROPLASTY Right 12/17/2020   Procedure: RIGHT TOTAL HIP ARTHROPLASTY ANTERIOR APPROACH;  Surgeon: Vernetta Lonni GRADE, MD;  Location: [facility] OR;  Service: Orthopedics;  Laterality: Right;   TOTAL KNEE ARTHROPLASTY Right 02/12/2014   Procedure: TOTAL KNEE ARTHROPLASTY;  Surgeon: Marcey Raman, MD;  Location: [facility] OR;  Service: Orthopedics;  Laterality: Right;   Patient Active Problem List   Diagnosis Date Noted   Moderate recurrent major depression (HCC) 07/29/2021   Status post total replacement of right hip 12/17/2020   Hip pain 07/24/2020   OSA on CPAP 04/04/2019   Peripheral arterial disease (HCC) 09/21/2017   Type 2 diabetes, with peripheral circulatory disorder not at goal [facility]) 08/16/2017   Chronic pain of left ankle 08/11/2017   Bilateral carpal tunnel syndrome 08/11/2017   Hypertension associated with  diabetes (HCC) 08/11/2017   Morbid obesity (HCC) 08/11/2017   Localized primary osteoarthritis of left lower leg 08/11/2017   Hyperlipidemia associated with type 2 diabetes mellitus (HCC) 08/11/2017   Tobacco use disorder 08/11/2017   History of adenomatous polyp of colon 07/23/2016   S/P total knee arthroplasty 02/12/2014    PCP: Jolinda Norene HERO, DO   REFERRING PROVIDER: Jolinda Norene HERO, DO   REFERRING DIAG:  [phone] (ICD-10-CM) - Right hip pain  M25.511,G89.29,M25.512 (ICD-10-CM) - Chronic pain of both shoulders    Rationale for Evaluation and Treatment:  Rehabiliation  THERAPY DIAG:  Muscle weakness (generalized)  Chronic right shoulder pain  Chronic left shoulder pain  Pain in right hip  ONSET DATE: Chronic pain, Rt THA 2022   SUBJECTIVE:  SUBJECTIVE STATEMENT: She says her pain is about the same, she hurts all over and feels very weak.  PERTINENT HISTORY:  See above PMH, had a right hip replacement back in August 2022   PAIN:  NPRS scale: 9/10 upon arrival Pain location:low back and right hip Pain description: constant, stinging Aggravating factors: sitting, standing, walking Relieving factors: rest, meds   PRECAUTIONS: ,  None  RED FLAGS: None   WEIGHT BEARING RESTRICTIONS:  No  FALLS:  Has patient fallen in last 6 months? No, but does report poor balance   PLOF:  Independent with basic ADLs, having trouble stepping over tub  PATIENT GOALS:  Reduce pain, get strength back  OBJECTIVE:  Note: Objective measures were completed at Evaluation unless otherwise noted.  DIAGNOSTIC FINDINGS:  An AP pelvis and lateral the right hip shows a well-seated right total hip arthroplasty with no complicating features. Previous x-rays of the left knee shows  tricompartment arthritis.   PATIENT SURVEYS:  Patient-Specific Activity Scoring Scheme  0 represents "unable to perform." 10 represents "able to perform at prior level. 0 1 2 3 4 5 6 7 8 9  10 (Date and Score)   Activity Eval     1. Cooking 4     2. walking 5    3. Getting up from bed or chair 4   4.    5.    Score 4.33/10    Total score = sum of the activity scores/number of activities Minimum detectable change (90%CI) for average score = 2 points Minimum detectable change (90%CI) for single activity score = 3 points  POSTURE:  Slumped posture  GAIT:  Eval Distance walked: 25 feet X 2 Assistive device utilized: Walker - 2 wheeled Level of assistance: SBA Comments: slow speed, fatigues easily   UPPER EXTREMITY ROM:  AROM/PROM Right eval Left eval  Shoulder flexion 60/100 30/100  Shoulder extension    Shoulder abduction 60 with elbows bent/90 60 with elbows bent/90  Shoulder adduction    Shoulder extension 30 25  Shoulder internal rotation    Shoulder external rotation    Elbow flexion    Elbow extension    Wrist flexion    Wrist extension    Wrist ulnar deviation    Wrist radial deviation    Wrist pronation    Wrist supination     (Blank rows = not tested)   UPPER EXTREMITY MMT:  MMT Right eval Left eval  Shoulder flexion 2 2  Shoulder extension    Shoulder abduction 2 2  Shoulder adduction    Shoulder extension    Shoulder internal rotation 2 2  Shoulder external rotation 2 2  Middle trapezius    Lower trapezius    Elbow flexion 4 4  Elbow extension 4 4  Wrist flexion    Wrist extension    Wrist ulnar deviation    Wrist radial deviation    Wrist pronation    Wrist supination    Grip strength     (Blank rows = not tested)      LOWER EXTREMITY MMT:    MMT Right eval Left eval  Hip flexion 3 3  Hip extension    Hip abduction    Hip adduction    Hip internal rotation    Hip external rotation    Knee flexion 3 3  Knee  extension 3 3  Ankle dorsiflexion 3 3  Ankle plantarflexion    Ankle inversion    Ankle eversion     (  Blank rows = not tested)  FUNCTIONAL TESTS:  Timed up and go (TUG): 40.5 seconds                                                                                                                              TREATMENT DATE:  11/08/23 HEP review and education Nu step LE/UE L1 X 8 min for functional endurance and ROM, needed 2 rest breaks to complete 8 min Standing Pball rollouts on high mat table for AAROM flexion for functional reaching Seated bilat alternating LAQ 1 min X 2 for leg strength Seated shoulder flexion AROM X 10 reps bilat  Seated hamstring curls red X 10 reps bilat Seated clams red 2X10 bilat Standing hip marches with UE support on RW X 10 reps bilat Seated shoulder abduction AROM X 10 reps bilat Sit to stand X 5 reps with UE support UBE X 4 min for shoulder ROM and endurance, with one rest break required Ambulation 25 feet X 3 with RW and supervision    PATIENT EDUCATION: Education details: HEP, PT plan of care, selfcare Person educated: Patient Education method: Explanation, Demonstration, Verbal cues, and Handouts Education comprehension: verbalized understanding, further education recommended   HOME EXERCISE PROGRAM: Access Code: [email or web address] URL: [email or web address] Date: 11/03/2023 Prepared by: Redell Moose  Exercises - Supine Shoulder Flexion Extension AAROM with Dowel  - 2 x daily - 6 x weekly - 1 sets - 10 reps - 5 sec hold - Seated Shoulder External Rotation AAROM with Dowel  - 2 x daily - 6 x weekly - 1 sets - 10 reps - 5 sec hold - Seated Bilateral Shoulder Flexion Towel Slide at Table Top  - 2 x daily - 6 x weekly - 1 sets - 10 reps - 5 sec hold - Seated Shoulder Abduction Towel Slide at Table Top with Forearm in Neutral  - 4-5 x daily - 6 x weekly - 1 sets - 10 reps - 5 hold - Seated Long Arc Quad  - 2 x daily - 6 x weekly - 2 sets  - 10 reps - alternating marching  - 2 x daily - 6 x weekly - 1-2 sets - 10 reps - Standing Hip Abduction with Counter Support  - 2 x daily - 6 x weekly - 1-2 sets - 10 reps  ASSESSMENT:  CLINICAL IMPRESSION: She remains limited by pain and fatigue but does show good effort in PT for exercises to improve endurance, ROM, and strength in efforts to improve functional level for ADL's.  Per Eval: Patient referred to PT for Bilateral shoulder pain and stiffness (concern for adhesive capsulitis) as well as Right hip pain that could be lumbar radiculpathy. She had a right hip replacement back in August 2022. Recent hospitilization 10/31/23 due to deconditioning and pain all over. She remains deconditioned and has generalized weakness.Patient will benefit from skilled PT to improve overall function and to address impairments and limitations listed below.  OBJECTIVE  IMPAIRMENTS: decreased activity tolerance for ADL's, difficulty walking, decreased balance, decreased endurance, decreased mobility, decreased ROM, decreased strength, impaired flexibility, impaired UE/LE use, and pain.  ACTIVITY LIMITATIONS: bending, lifting, carry, reaching, locomotion, cleaning, community activity, driving, and or occupation  PERSONAL FACTORS: see above PMH  also affecting patient's functional outcome.  REHAB POTENTIAL: Fair    CLINICAL DECISION MAKING: Evolving/moderate complexity  EVALUATION COMPLEXITY: Moderate    GOALS: Short term PT Goals Target date: 12/01/2023   Pt will be I and compliant with HEP. Baseline:  Goal status: New Pt will decrease pain by 25% overall Baseline: Goal status: New  Long term PT goals Target date:12/15/2023  Pt will improve shoulder AROM to [facility] (at least 90 deg flexion and abd)  to improve functional mobility Baseline: Goal status: New Pt will improve  strength to at least 4+/5 MMT to improve functional strength Baseline: Goal status: New Pt will improve Patient specific  functional scale (PSFS) to at least 7/10 to show improved function level Baseline:4.33 Goal status: New Pt will reduce pain to overall less than 3/10 with usual activity and walking >67min Baseline: Goal status: New Pt will improve Timed up and go (TUG) time to less than 20 seconds with LRAD to show improved gait speed and balance Baseline:40 sec Goal status: New  PLAN: PT FREQUENCY: 1-3 times per week   PT DURATION: 6-8 weeks  PLANNED INTERVENTIONS (unless contraindicated):  97110-Therapeutic exercises, 97530- Therapeutic activity, [id number]- Neuromuscular re-education, 97535- Self Care, 02859- Manual therapy, [id number]- Gait training, H9716- Electrical stimulation (unattended), [id number]- Ultrasound, 02987- Traction (mechanical), and 97033- Ionotophoresis 4mg /ml Dexamethasone  PLAN FOR NEXT SESSION: Nu step for endurance, work on shoulder ROM and UE strength, LE strength, and gait, balance.  NEXT MD VISIT: nothing scheduled at eval  Redell JONELLE Moose, PT,DPT 11/11/2023, 3:42 PM

## 2023-11-11 NOTE — Patient Instructions (Signed)

## 2023-11-12 ENCOUNTER — Ambulatory Visit: Payer: Self-pay

## 2023-11-12 NOTE — Telephone Encounter (Signed)
 Appt made

## 2023-11-12 NOTE — Telephone Encounter (Signed)
 FYI Only or Action Required?: FYI only for provider.  Patient was last seen in primary care on 10/25/2023 by Jolinda Norene HERO, DO.  Called Nurse Triage reporting Fatigue.  Symptoms began about a month ago.  Interventions attempted: Other: attending PT.  Symptoms are: unchanged.  Triage Disposition: See PCP Within 2 Weeks  Patient/caregiver understands and will follow disposition?: Unsure  Copied from CRM [id number]. Topic: Clinical - Medical Advice >> Nov 12, 2023 11:42 AM Everette C wrote: Reason for CRM: The patient shares that they have attended physical therapy twice with little to no improvement. The patient is concerned with their inability to maintain grip of items in their hands and would like to discuss further when possible. Reason for Disposition  Fatigue is a chronic symptom (recurrent or ongoing AND present > 4 weeks)  Answer Assessment - Initial Assessment Questions 1. DESCRIPTION: Describe how you are feeling.     Tired and weak 2. SEVERITY: How bad is it?  Can you stand and walk?     Difficult to perform ADL's including get to the shower and clean her house.  She reports low energy. Reports tingling in her fingers of bilateral hands and does not have a good grip on things 3. ONSET: When did these symptoms begin? (e.g., hours, days, weeks, months)     One month 4. CAUSE: What do you think is causing the weakness or fatigue? (e.g., not drinking enough fluids, medical problem, trouble sleeping)     States that at [facility], she had a UTI and that she received fluids and antibiotics, but she is finished with these 5. NEW MEDICINES:  Have you started on any new medicines recently? (e.g., opioid pain medicines, benzodiazepines, muscle relaxants, antidepressants, antihistamines, neuroleptics, beta blockers)     Takes muscle relaxers twice per day since 09/14/23, and also started ozempic in May 6. OTHER SYMPTOMS: Do you have any other symptoms? (e.g.,  chest pain, fever, cough, SOB, vomiting, diarrhea, bleeding, other areas of pain)     Patient reports SOB with mild exertion, walking down the hall  Protocols used: Weakness (Generalized) and Fatigue-A-AH

## 2023-11-15 ENCOUNTER — Ambulatory Visit: Admitting: Physical Therapy

## 2023-11-15 ENCOUNTER — Encounter: Payer: Self-pay | Admitting: Physical Therapy

## 2023-11-15 DIAGNOSIS — M6281 Muscle weakness (generalized): Secondary | ICD-10-CM | POA: Diagnosis not present

## 2023-11-15 DIAGNOSIS — M25551 Pain in right hip: Secondary | ICD-10-CM

## 2023-11-15 DIAGNOSIS — G8929 Other chronic pain: Secondary | ICD-10-CM | POA: Diagnosis not present

## 2023-11-15 DIAGNOSIS — M25512 Pain in left shoulder: Secondary | ICD-10-CM | POA: Diagnosis not present

## 2023-11-15 DIAGNOSIS — M25511 Pain in right shoulder: Secondary | ICD-10-CM | POA: Diagnosis not present

## 2023-11-15 NOTE — Therapy (Signed)
 OUTPATIENT PHYSICAL THERAPY TREATMENT  Patient Name: Jennifer [name] MRN: [id number] DOB:05-Jul-1950, 73 y.o., female Today's Date: 11/15/2023  END OF SESSION:  PT End of Session - 11/15/23 1447     Visit Number 4    Number of Visits 12    Date for PT Re-Evaluation 12/15/23    Authorization Type UHC MCR    Progress Note Due on Visit 10    PT Start Time 1440    PT Stop Time 1518    PT Time Calculation (min) 38 min    Activity Tolerance Patient limited by pain;Patient limited by fatigue    Behavior During Therapy [facility] for tasks assessed/performed          Past Medical History:  Diagnosis Date   Arthritis    Dyspnea    Hyperlipidemia    Hypertension    Pre-diabetes    Sleep apnea    Past Surgical History:  Procedure Laterality Date   ABDOMINAL HYSTERECTOMY     COLONOSCOPY N/A 11/25/2016   Procedure: COLONOSCOPY;  Surgeon: Golda Claudis PENNER, MD;  [facility];  Service: Endoscopy;  Laterality: N/A;  830   EYE SURGERY     FOOT SURGERY Left 2018   reconstructed tendon   KNEE ARTHROSCOPY     BIL   2010    LUNG BIOPSY     RIGHT 30 YRS AGO    TOTAL HIP ARTHROPLASTY Right 12/17/2020   Procedure: RIGHT TOTAL HIP ARTHROPLASTY ANTERIOR APPROACH;  Surgeon: Vernetta Lonni GRADE, MD;  Location: [facility] OR;  Service: Orthopedics;  Laterality: Right;   TOTAL KNEE ARTHROPLASTY Right 02/12/2014   Procedure: TOTAL KNEE ARTHROPLASTY;  Surgeon: Marcey Raman, MD;  Location: [facility];  Service: Orthopedics;  Laterality: Right;   Patient Active Problem List   Diagnosis Date Noted   Moderate recurrent major depression (HCC) 07/29/2021   Status post total replacement of right hip 12/17/2020   Hip pain 07/24/2020   OSA on CPAP 04/04/2019   Peripheral arterial disease (HCC) 09/21/2017   Type 2 diabetes, with peripheral circulatory disorder not at goal [facility]) 08/16/2017   Chronic pain of left ankle 08/11/2017   Bilateral carpal tunnel syndrome 08/11/2017   Hypertension associated with  diabetes (HCC) 08/11/2017   Morbid obesity (HCC) 08/11/2017   Localized primary osteoarthritis of left lower leg 08/11/2017   Hyperlipidemia associated with type 2 diabetes mellitus (HCC) 08/11/2017   Tobacco use disorder 08/11/2017   History of adenomatous polyp of colon 07/23/2016   S/P total knee arthroplasty 02/12/2014    PCP: Jolinda Norene HERO, DO   REFERRING PROVIDER: Jolinda Norene HERO, DO   REFERRING DIAG:  [phone] (ICD-10-CM) - Right hip pain  M25.511,G89.29,M25.512 (ICD-10-CM) - Chronic pain of both shoulders    Rationale for Evaluation and Treatment:  Rehabiliation  THERAPY DIAG:  Muscle weakness (generalized)  Chronic right shoulder pain  Chronic left shoulder pain  Pain in right hip  ONSET DATE: Chronic pain, Rt THA 2022   SUBJECTIVE:  SUBJECTIVE STATEMENT: She continues to rate high amounts of pain and weakness all over  PERTINENT HISTORY:  See above PMH, had a right hip replacement back in August 2022   PAIN:  NPRS scale: 9/10 upon arrival Pain location:low back and right hip Pain description: constant, stinging Aggravating factors: sitting, standing, walking Relieving factors: rest, meds   PRECAUTIONS: ,  None  RED FLAGS: None   WEIGHT BEARING RESTRICTIONS:  No  FALLS:  Has patient fallen in last 6 months? No, but does report poor balance   PLOF:  Independent with basic ADLs, having trouble stepping over tub  PATIENT GOALS:  Reduce pain, get strength back  OBJECTIVE:  Note: Objective measures were completed at Evaluation unless otherwise noted.  DIAGNOSTIC FINDINGS:  An AP pelvis and lateral the right hip shows a well-seated right total hip arthroplasty with no complicating features. Previous x-rays of the left knee shows tricompartment  arthritis.   PATIENT SURVEYS:  Patient-Specific Activity Scoring Scheme  0 represents "unable to perform." 10 represents "able to perform at prior level. 0 1 2 3 4 5 6 7 8 9  10 (Date and Score)   Activity Eval     1. Cooking 4     2. walking 5    3. Getting up from bed or chair 4   4.    5.    Score 4.33/10    Total score = sum of the activity scores/number of activities Minimum detectable change (90%CI) for average score = 2 points Minimum detectable change (90%CI) for single activity score = 3 points  POSTURE:  Slumped posture  GAIT:  Eval Distance walked: 25 feet X 2 Assistive device utilized: Walker - 2 wheeled Level of assistance: SBA Comments: slow speed, fatigues easily   UPPER EXTREMITY ROM:  AROM/PROM Right eval Left eval  Shoulder flexion 60/100 30/100  Shoulder extension    Shoulder abduction 60 with elbows bent/90 60 with elbows bent/90  Shoulder adduction    Shoulder extension 30 25  Shoulder internal rotation    Shoulder external rotation    Elbow flexion    Elbow extension    Wrist flexion    Wrist extension    Wrist ulnar deviation    Wrist radial deviation    Wrist pronation    Wrist supination     (Blank rows = not tested)   UPPER EXTREMITY MMT:  MMT Right eval Left eval  Shoulder flexion 2 2  Shoulder extension    Shoulder abduction 2 2  Shoulder adduction    Shoulder extension    Shoulder internal rotation 2 2  Shoulder external rotation 2 2  Middle trapezius    Lower trapezius    Elbow flexion 4 4  Elbow extension 4 4  Wrist flexion    Wrist extension    Wrist ulnar deviation    Wrist radial deviation    Wrist pronation    Wrist supination    Grip strength     (Blank rows = not tested)      LOWER EXTREMITY MMT:    MMT Right eval Left eval  Hip flexion 3 3  Hip extension    Hip abduction    Hip adduction    Hip internal rotation    Hip external rotation    Knee flexion 3 3  Knee extension 3 3  Ankle  dorsiflexion 3 3  Ankle plantarflexion    Ankle inversion    Ankle eversion     (Blank rows =  not tested)  FUNCTIONAL TESTS:  Timed up and go (TUG): 40.5 seconds                                                                                                                              TREATMENT DATE:  11/15/23 Nu step LE/UE L1 X 8 min for functional endurance and ROM, needed 2 rest breaks to complete 8 min Standing Pball rolling up wall for AAROM flexion for functional reaching Seated bilat alternating LAQ 1 min X 2 for leg strength Seated shoulder flexion AROM  2X 5 reps bilat  Seated clams green 2X10 bilat Seated chest press green 2X10 Seated rows green 2X10 Seated shoulder abduction AROM X 10 reps bilat Sit to stand 2 X 5 reps with UE support UBE X 4 min for shoulder ROM and endurance, with one rest break required Ambulation 25 feet X 3 with RW and supervision  11/08/23 HEP review and education Nu step LE/UE L1 X 8 min for functional endurance and ROM, needed 2 rest breaks to complete 8 min Standing Pball rollouts on high mat table for AAROM flexion for functional reaching Seated bilat alternating LAQ 1 min X 2 for leg strength Seated shoulder flexion AROM X 10 reps bilat  Seated hamstring curls red X 10 reps bilat Seated clams red 2X10 bilat Standing hip marches with UE support on RW X 10 reps bilat Seated shoulder abduction AROM X 10 reps bilat Sit to stand X 5 reps with UE support UBE X 4 min for shoulder ROM and endurance, with one rest break required Ambulation 25 feet X 3 with RW and supervision    PATIENT EDUCATION: Education details: HEP, PT plan of care, selfcare Person educated: Patient Education method: Explanation, Demonstration, Verbal cues, and Handouts Education comprehension: verbalized understanding, further education recommended   HOME EXERCISE PROGRAM: Access Code: [email or web address] URL: [email or web address] Date: 11/03/2023 Prepared by:  Redell Moose  Exercises - Supine Shoulder Flexion Extension AAROM with Dowel  - 2 x daily - 6 x weekly - 1 sets - 10 reps - 5 sec hold - Seated Shoulder External Rotation AAROM with Dowel  - 2 x daily - 6 x weekly - 1 sets - 10 reps - 5 sec hold - Seated Bilateral Shoulder Flexion Towel Slide at Table Top  - 2 x daily - 6 x weekly - 1 sets - 10 reps - 5 sec hold - Seated Shoulder Abduction Towel Slide at Table Top with Forearm in Neutral  - 4-5 x daily - 6 x weekly - 1 sets - 10 reps - 5 hold - Seated Long Arc Quad  - 2 x daily - 6 x weekly - 2 sets - 10 reps - alternating marching  - 2 x daily - 6 x weekly - 1-2 sets - 10 reps - Standing Hip Abduction with Counter Support  - 2 x daily - 6 x weekly - 1-2 sets - 10 reps  ASSESSMENT:  CLINICAL IMPRESSION: She continues to be limited by pain, weakness, and fatigue but does what she can in PT. We will continue to exercise her as tolerated to try to improve function. She was able to lift her legs and arms some better today but again this remains overall very limited.   Per Eval: Patient referred to PT for Bilateral shoulder pain and stiffness (concern for adhesive capsulitis) as well as Right hip pain that could be lumbar radiculpathy. She had a right hip replacement back in August 2022. Recent hospitilization 10/31/23 due to deconditioning and pain all over. She remains deconditioned and has generalized weakness.Patient will benefit from skilled PT to improve overall function and to address impairments and limitations listed below.  OBJECTIVE IMPAIRMENTS: decreased activity tolerance for ADL's, difficulty walking, decreased balance, decreased endurance, decreased mobility, decreased ROM, decreased strength, impaired flexibility, impaired UE/LE use, and pain.  ACTIVITY LIMITATIONS: bending, lifting, carry, reaching, locomotion, cleaning, community activity, driving, and or occupation  PERSONAL FACTORS: see above PMH  also affecting patient's  functional outcome.  REHAB POTENTIAL: Fair    CLINICAL DECISION MAKING: Evolving/moderate complexity  EVALUATION COMPLEXITY: Moderate    GOALS: Short term PT Goals Target date: 12/01/2023   Pt will be I and compliant with HEP. Baseline:  Goal status: New Pt will decrease pain by 25% overall Baseline: Goal status: New  Long term PT goals Target date:12/15/2023  Pt will improve shoulder AROM to [facility] (at least 90 deg flexion and abd)  to improve functional mobility Baseline: Goal status: New Pt will improve  strength to at least 4+/5 MMT to improve functional strength Baseline: Goal status: New Pt will improve Patient specific functional scale (PSFS) to at least 7/10 to show improved function level Baseline:4.33 Goal status: New Pt will reduce pain to overall less than 3/10 with usual activity and walking >7min Baseline: Goal status: New Pt will improve Timed up and go (TUG) time to less than 20 seconds with LRAD to show improved gait speed and balance Baseline:40 sec Goal status: New  PLAN: PT FREQUENCY: 1-3 times per week   PT DURATION: 6-8 weeks  PLANNED INTERVENTIONS (unless contraindicated):  97110-Therapeutic exercises, 97530- Therapeutic activity, [id number]- Neuromuscular re-education, 97535- Self Care, 02859- Manual therapy, [id number]- Gait training, H9716- Electrical stimulation (unattended), [id number]- Ultrasound, 02987- Traction (mechanical), and 97033- Ionotophoresis 4mg /ml Dexamethasone  PLAN FOR NEXT SESSION: Nu step for endurance, work on shoulder ROM and UE strength, LE strength, and gait, balance.  NEXT MD VISIT: nothing scheduled at eval  Redell JONELLE Moose, PT,DPT 11/15/2023, 3:07 PM

## 2023-11-16 ENCOUNTER — Telehealth: Payer: Self-pay | Admitting: Family Medicine

## 2023-11-16 ENCOUNTER — Encounter: Payer: Self-pay | Admitting: Nurse Practitioner

## 2023-11-16 ENCOUNTER — Ambulatory Visit (INDEPENDENT_AMBULATORY_CARE_PROVIDER_SITE_OTHER): Admitting: Nurse Practitioner

## 2023-11-16 VITALS — BP 117/78 | HR 82 | Temp 98.8°F | Ht 64.0 in | Wt 197.0 lb

## 2023-11-16 DIAGNOSIS — Z96641 Presence of right artificial hip joint: Secondary | ICD-10-CM | POA: Diagnosis not present

## 2023-11-16 DIAGNOSIS — M25559 Pain in unspecified hip: Secondary | ICD-10-CM

## 2023-11-16 DIAGNOSIS — E871 Hypo-osmolality and hyponatremia: Secondary | ICD-10-CM | POA: Diagnosis not present

## 2023-11-16 DIAGNOSIS — Z96651 Presence of right artificial knee joint: Secondary | ICD-10-CM | POA: Diagnosis not present

## 2023-11-16 DIAGNOSIS — M1712 Unilateral primary osteoarthritis, left knee: Secondary | ICD-10-CM | POA: Diagnosis not present

## 2023-11-16 NOTE — Telephone Encounter (Signed)
 Appt made 04-26-2024

## 2023-11-16 NOTE — Progress Notes (Signed)
 Acute Office Visit  Subjective:     Patient ID: Jennifer [name], female    DOB: 1950-05-27, 73 y.o.   MRN: [id number]  No chief complaint on file.   HPI Jennifer [name] is a 73 year old female presenting on 11/16/2023 for an acute visit due to difficulty walking and concerns about overall weakness. She reports that she began using a walker approximately one month ago following a right knee replacement and prior right hip replacement. She is currently undergoing physical therapy and had a session yesterday. Despite therapy, she continues to struggle with mobility and reports significant difficulty performing activities of daily living (ADLs), stating, "I can't get in the shower. My daughter has to help me with bathing. I'm not able to stand in the shower to wash myself, and there's no place to sit." She also endorses ongoing fatigue and generalized weakness. During her recent hospitalization, she was noted to have low sodium levels; repeat labs are planned today to reassess.  Active Ambulatory Problems    Diagnosis Date Noted   S/P total knee arthroplasty 02/12/2014   History of adenomatous polyp of colon 07/23/2016   Chronic pain of left ankle 08/11/2017   Bilateral carpal tunnel syndrome 08/11/2017   Hypertension associated with diabetes (HCC) 08/11/2017   Morbid obesity (HCC) 08/11/2017   Localized primary osteoarthritis of left lower leg 08/11/2017   Hyperlipidemia associated with type 2 diabetes mellitus (HCC) 08/11/2017   Tobacco use disorder 08/11/2017   Type 2 diabetes, with peripheral circulatory disorder not at goal [facility]) 08/16/2017   Peripheral arterial disease (HCC) 09/21/2017   OSA on CPAP 04/04/2019   Hip pain 07/24/2020   Status post total replacement of right hip 12/17/2020   Moderate recurrent major depression (HCC) 07/29/2021   Hyponatremia 11/16/2023   Resolved Ambulatory Problems    Diagnosis Date Noted   Unilateral primary osteoarthritis, right hip 12/17/2020    Past Medical History:  Diagnosis Date   Arthritis    Dyspnea    Hyperlipidemia    Hypertension    Pre-diabetes    Sleep apnea     Review of Systems  Constitutional:  Negative for fever.  Cardiovascular:  Negative for chest pain and leg swelling.  Gastrointestinal:  Negative for constipation, diarrhea, nausea and vomiting.  Skin:  Negative for itching and rash.  Neurological:  Negative for dizziness and headaches.   Negative unless indicated in HPI    Objective:    BP 117/78   Pulse 82   Temp 98.8 F (37.1 C)   Ht 5' 4 (1.626 m)   Wt 197 lb (89.4 kg)   SpO2 96%   BMI 33.81 kg/m    Physical Exam Vitals and nursing note reviewed.  Constitutional:      General: She is not in acute distress.    Appearance: She is obese.  HENT:     Head: Normocephalic and atraumatic.     Nose: Nose normal.     Mouth/Throat:     Mouth: Mucous membranes are moist.  Eyes:     General: No scleral icterus.    Extraocular Movements: Extraocular movements intact.     Conjunctiva/sclera: Conjunctivae normal.     Pupils: Pupils are equal, round, and reactive to light.  Musculoskeletal:     Right lower leg: No edema.     Left lower leg: No edema.     Comments: Use a walker for ambulation  Skin:    General: Skin is warm and dry.  Findings: No rash.  Neurological:     Mental Status: She is alert and oriented to person, place, and time.  Psychiatric:        Mood and Affect: Mood normal.        Behavior: Behavior normal.        Thought Content: Thought content normal.        Judgment: Judgment normal.    Pertinent labs & imaging results that were available during my care of the patient were reviewed by me and considered in my medical decision making.  No results found for any visits on 11/16/23.      Assessment & Plan:  Hip pain, unspecified laterality  Hyponatremia -     BMP8+EGFR  Status post total replacement of right hip  Status post total right knee  replacement  Localized primary osteoarthritis of left lower leg   Jennifer [name] is a 73 year old African-American female seen today for fatigue, no acute distress Hyponatremia: Recheck  BMP/sodium Continue physical therapy, discussed with team about getting possible shower chair to help ADLs/shower  The above assessment and management plan was discussed with the patient. The patient verbalized understanding of and has agreed to the management plan. Patient is aware to call the clinic if they develop any new symptoms or if symptoms persist or worsen. Patient is aware when to return to the clinic for a follow-up visit. Patient educated on when it is appropriate to go to the emergency department.  Return if symptoms worsen or fail to improve.  Jennifer [name] [place] Thompson, DNP [facility] [address] [place], [place] [address] [phone]  Note: This document was prepared by [employer] voice dictation technology and any errors that results from this process are unintentional.

## 2023-11-16 NOTE — Telephone Encounter (Signed)
 Patient needs appt for Return in about 6 months (around 04/26/2024) for Annual physical, Diabetes follow up. First available will be March 31st. Please call patient with appt.

## 2023-11-17 ENCOUNTER — Ambulatory Visit: Admitting: Physical Therapy

## 2023-11-17 ENCOUNTER — Ambulatory Visit: Payer: Self-pay | Admitting: Nurse Practitioner

## 2023-11-17 ENCOUNTER — Telehealth: Payer: Self-pay | Admitting: Family Medicine

## 2023-11-17 LAB — BMP8+EGFR
BUN/Creatinine Ratio: 21 (ref 12–28)
BUN: 15 mg/dL (ref 8–27)
CO2: 20 mmol/L (ref 20–29)
Calcium: 10.3 mg/dL (ref 8.7–10.3)
Chloride: 94 mmol/L — ABNORMAL LOW (ref 96–106)
Creatinine, Ser: 0.73 mg/dL (ref 0.57–1.00)
Glucose: 93 mg/dL (ref 70–99)
Potassium: 4 mmol/L (ref 3.5–5.2)
Sodium: 130 mmol/L — ABNORMAL LOW (ref 134–144)
eGFR: 88 mL/min/1.73 (ref >59)

## 2023-11-17 NOTE — Telephone Encounter (Signed)
 Copied from CRM [id number]. Topic: Referral - Request for Referral >> Nov 16, 2023  5:48 PM Jennifer [name] wrote: Did the patient discuss referral with their provider in the last year? Yes (If No - schedule appointment) (If Yes - send message)  Appointment offered? No  Type of order/referral and detailed reason for visit: not getting better, having trouble walking  Preference of office, provider, location: [facility]  If referral order, have you been seen by this specialty before? No (If Yes, this issue or another issue? When? Where?  Can we respond through MyChart? No >> Nov 17, 2023 10:14 AM Jennifer [name] wrote: Patient called.. says she [facility] not surgery.. In pain..  >> Nov 17, 2023  7:43 AM Jennifer [name] wrote: Please Review.

## 2023-11-17 NOTE — Telephone Encounter (Signed)
 Copied from CRM [id number]. Topic: Referral - Request for Referral >> Nov 16, 2023  5:48 PM Delon DASEN wrote: Did the patient discuss referral with their provider in the last year? Yes (If No - schedule appointment) (If Yes - send message)  Appointment offered? No  Type of order/referral and detailed reason for visit: not getting better, having trouble walking  Preference of office, provider, location: [facility]  If referral order, have you been seen by this specialty before? No (If Yes, this issue or another issue? When? Where?  Can we respond through MyChart? No   I am unsure as to what this pertains to.  I assume maybe she is asking for a surgical referral not neurology seeing as she saw Nena yesterday for hip pain?  Please confirm exactly what patient is asking for.

## 2023-11-17 NOTE — Telephone Encounter (Signed)
 Duplicate patient called back and routed to the treating provider.

## 2023-11-18 ENCOUNTER — Telehealth: Payer: Self-pay

## 2023-11-18 ENCOUNTER — Ambulatory Visit (HOSPITAL_COMMUNITY): Admission: RE | Admit: 2023-11-18 | Source: Ambulatory Visit

## 2023-11-18 NOTE — Telephone Encounter (Signed)
 Patient aware and verbalizes understanding.

## 2023-11-18 NOTE — Telephone Encounter (Signed)
 Attempted to contact - NA

## 2023-11-18 NOTE — Telephone Encounter (Signed)
 I called and spoke with patient and she says she has pain in hip, arms, and shoulders, and is weak. Says she has been doing PT but its not helping and she wants to be referred to [facility] in [place] through [place].     Copied from CRM #[id number]. Topic: Referral - Request for Referral >> Nov 18, 2023 10:37 AM Zebedee SAUNDERS wrote: Did the patient discuss referral with their provider in the last year? Yes (If No - schedule appointment) (If Yes - send message)  Appointment offered? Yes  Type of order/referral and detailed reason for visit: Neurology Can't lift arms.   Preference of office, provider, location: Pt's area. [place], [place]   If referral order, have you been seen by this specialty before? No (If Yes, this issue or another issue? When? Where?  Can we respond through MyChart? No

## 2023-11-18 NOTE — Telephone Encounter (Signed)
 Copied from CRM #[id number]. Topic: Referral - Status >> Nov 18, 2023  9:45 AM Ivette P wrote: Reason for CRM: Pt stated she receved a call from the office and wanted to know if it was for her referral. Pt would like to know the status of her nuero referral.

## 2023-11-19 DIAGNOSIS — N390 Urinary tract infection, site not specified: Secondary | ICD-10-CM | POA: Diagnosis not present

## 2023-11-19 DIAGNOSIS — I517 Cardiomegaly: Secondary | ICD-10-CM | POA: Diagnosis not present

## 2023-11-19 DIAGNOSIS — J810 Acute pulmonary edema: Secondary | ICD-10-CM | POA: Diagnosis not present

## 2023-11-19 DIAGNOSIS — E119 Type 2 diabetes mellitus without complications: Secondary | ICD-10-CM | POA: Diagnosis not present

## 2023-11-19 DIAGNOSIS — M1990 Unspecified osteoarthritis, unspecified site: Secondary | ICD-10-CM | POA: Diagnosis not present

## 2023-11-19 DIAGNOSIS — R918 Other nonspecific abnormal finding of lung field: Secondary | ICD-10-CM | POA: Diagnosis not present

## 2023-11-19 DIAGNOSIS — F17210 Nicotine dependence, cigarettes, uncomplicated: Secondary | ICD-10-CM | POA: Diagnosis not present

## 2023-11-19 DIAGNOSIS — Z87440 Personal history of urinary (tract) infections: Secondary | ICD-10-CM | POA: Diagnosis not present

## 2023-11-19 DIAGNOSIS — G4733 Obstructive sleep apnea (adult) (pediatric): Secondary | ICD-10-CM | POA: Diagnosis not present

## 2023-11-19 DIAGNOSIS — R531 Weakness: Secondary | ICD-10-CM | POA: Diagnosis not present

## 2023-11-19 DIAGNOSIS — E785 Hyperlipidemia, unspecified: Secondary | ICD-10-CM | POA: Diagnosis not present

## 2023-11-20 DIAGNOSIS — I4510 Unspecified right bundle-branch block: Secondary | ICD-10-CM | POA: Diagnosis not present

## 2023-11-22 ENCOUNTER — Telehealth: Payer: Self-pay

## 2023-11-22 ENCOUNTER — Encounter: Payer: Self-pay | Admitting: Physical Therapy

## 2023-11-22 ENCOUNTER — Ambulatory Visit: Attending: Family Medicine | Admitting: Physical Therapy

## 2023-11-22 DIAGNOSIS — G8929 Other chronic pain: Secondary | ICD-10-CM | POA: Diagnosis not present

## 2023-11-22 DIAGNOSIS — M25551 Pain in right hip: Secondary | ICD-10-CM | POA: Insufficient documentation

## 2023-11-22 DIAGNOSIS — M25512 Pain in left shoulder: Secondary | ICD-10-CM | POA: Diagnosis not present

## 2023-11-22 DIAGNOSIS — M25511 Pain in right shoulder: Secondary | ICD-10-CM | POA: Insufficient documentation

## 2023-11-22 DIAGNOSIS — M6281 Muscle weakness (generalized): Secondary | ICD-10-CM | POA: Diagnosis not present

## 2023-11-22 NOTE — Therapy (Signed)
 OUTPATIENT PHYSICAL THERAPY TREATMENT  Patient Name: Jennifer [name] MRN: [id number] DOB:09-30-1950, 73 y.o., female Today's Date: 11/22/2023  END OF SESSION:  PT End of Session - 11/22/23 1416     Visit Number 5    Number of Visits 12    Date for PT Re-Evaluation 12/15/23    Authorization Type [employer] MCR    Progress Note Due on Visit 6    PT Start Time 1345    PT Stop Time 1425    PT Time Calculation (min) 40 min    Activity Tolerance Patient limited by pain;Patient limited by fatigue    Behavior During Therapy [facility] for tasks assessed/performed          Past Medical History:  Diagnosis Date   Arthritis    Dyspnea    Hyperlipidemia    Hypertension    Pre-diabetes    Sleep apnea    Past Surgical History:  Procedure Laterality Date   ABDOMINAL HYSTERECTOMY     COLONOSCOPY N/A 11/25/2016   Procedure: COLONOSCOPY;  Surgeon: Golda Claudis PENNER, MD;  Location: [facility];  Service: Endoscopy;  Laterality: N/A;  830   EYE SURGERY     FOOT SURGERY Left 2018   reconstructed tendon   KNEE ARTHROSCOPY     BIL   2010    LUNG BIOPSY     RIGHT 30 YRS AGO    TOTAL HIP ARTHROPLASTY Right 12/17/2020   Procedure: RIGHT TOTAL HIP ARTHROPLASTY ANTERIOR APPROACH;  Surgeon: Vernetta Lonni GRADE, MD;  Location: [facility] OR;  Service: Orthopedics;  Laterality: Right;   TOTAL KNEE ARTHROPLASTY Right 02/12/2014   Procedure: TOTAL KNEE ARTHROPLASTY;  Surgeon: Marcey Raman, MD;  Location: [facility] OR;  Service: Orthopedics;  Laterality: Right;   Patient Active Problem List   Diagnosis Date Noted   Hyponatremia 11/16/2023   Moderate recurrent major depression (HCC) 07/29/2021   Status post total replacement of right hip 12/17/2020   Hip pain 07/24/2020   OSA on CPAP 04/04/2019   Peripheral arterial disease (HCC) 09/21/2017   Type 2 diabetes, with peripheral circulatory disorder not at goal [facility]) 08/16/2017   Chronic pain of left ankle 08/11/2017   Bilateral carpal tunnel syndrome 08/11/2017    Hypertension associated with diabetes (HCC) 08/11/2017   Morbid obesity (HCC) 08/11/2017   Localized primary osteoarthritis of left lower leg 08/11/2017   Hyperlipidemia associated with type 2 diabetes mellitus (HCC) 08/11/2017   Tobacco use disorder 08/11/2017   History of adenomatous polyp of colon 07/23/2016   S/P total knee arthroplasty 02/12/2014    PCP: Jolinda Norene HERO, DO   REFERRING PROVIDER: Jolinda Norene HERO, DO   REFERRING DIAG:  [phone] (ICD-10-CM) - Right hip pain  M25.511,G89.29,M25.512 (ICD-10-CM) - Chronic pain of both shoulders    Rationale for Evaluation and Treatment:  Rehabiliation  THERAPY DIAG:  Muscle weakness (generalized)  Chronic right shoulder pain  Chronic left shoulder pain  Pain in right hip  ONSET DATE: Chronic pain, Rt THA 2022   SUBJECTIVE:  SUBJECTIVE STATEMENT: Pain is better but she still feels weak and tired.  PERTINENT HISTORY:  See above PMH, had a right hip replacement back in August 2022   PAIN:  NPRS scale: 6/10 upon arrival Pain location:low back and right hip Pain description: constant, stinging Aggravating factors: sitting, standing, walking Relieving factors: rest, meds   PRECAUTIONS: ,  None  RED FLAGS: None   WEIGHT BEARING RESTRICTIONS:  No  FALLS:  Has patient fallen in last 6 months? No, but does report poor balance   PLOF:  Independent with basic ADLs, having trouble stepping over tub  PATIENT GOALS:  Reduce pain, get strength back  OBJECTIVE:  Note: Objective measures were completed at Evaluation unless otherwise noted.  DIAGNOSTIC FINDINGS:  An AP pelvis and lateral the right hip shows a well-seated right total hip arthroplasty with no complicating features. Previous x-rays of the left knee shows  tricompartment arthritis.   PATIENT SURVEYS:  Patient-Specific Activity Scoring Scheme  0 represents "unable to perform." 10 represents "able to perform at prior level. 0 1 2 3 4 5 6 7 8 9  10 (Date and Score)   Activity Eval     1. Cooking 4     2. walking 5    3. Getting up from bed or chair 4   4.    5.    Score 4.33/10    Total score = sum of the activity scores/number of activities Minimum detectable change (90%CI) for average score = 2 points Minimum detectable change (90%CI) for single activity score = 3 points  POSTURE:  Slumped posture  GAIT:  Eval Distance walked: 25 feet X 2 Assistive device utilized: Walker - 2 wheeled Level of assistance: SBA Comments: slow speed, fatigues easily   UPPER EXTREMITY ROM:  AROM/PROM Right eval Left eval  Shoulder flexion 60/100 30/100  Shoulder extension    Shoulder abduction 60 with elbows bent/90 60 with elbows bent/90  Shoulder adduction    Shoulder extension 30 25  Shoulder internal rotation    Shoulder external rotation    Elbow flexion    Elbow extension    Wrist flexion    Wrist extension    Wrist ulnar deviation    Wrist radial deviation    Wrist pronation    Wrist supination     (Blank rows = not tested)   UPPER EXTREMITY MMT:  MMT Right eval Left eval  Shoulder flexion 2 2  Shoulder extension    Shoulder abduction 2 2  Shoulder adduction    Shoulder extension    Shoulder internal rotation 2 2  Shoulder external rotation 2 2  Middle trapezius    Lower trapezius    Elbow flexion 4 4  Elbow extension 4 4  Wrist flexion    Wrist extension    Wrist ulnar deviation    Wrist radial deviation    Wrist pronation    Wrist supination    Grip strength     (Blank rows = not tested)      LOWER EXTREMITY MMT:    MMT Right eval Left eval  Hip flexion 3 3  Hip extension    Hip abduction    Hip adduction    Hip internal rotation    Hip external rotation    Knee flexion 3 3  Knee  extension 3 3  Ankle dorsiflexion 3 3  Ankle plantarflexion    Ankle inversion    Ankle eversion     (Blank rows = not  tested)  FUNCTIONAL TESTS:  Timed up and go (TUG): 40.5 seconds                                                                                                                              TREATMENT DATE:  11/22/23 Nu step LE/UE L1 X 8 min for functional endurance and ROM, needed 2 rest breaks to complete 8 min Seated LAQ with green band 2X10 bilat Seated shoulder flexion AAROM X 10 reps bilat  Seated clams green 2X10 bilat Seated chest press green 2X10 Seated rows green 2X10 Seated shoulder abduction AROM X 10 reps bilat Standing marches with RW, X 10 bilat Standing hip abductions with RW X 5 bilat Ambulation 25 feet X 3 with RW and supervision  11/15/23 Nu step LE/UE L1 X 8 min for functional endurance and ROM, needed 2 rest breaks to complete 8 min Standing Pball rolling up wall for AAROM flexion for functional reaching Seated bilat alternating LAQ 1 min X 2 for leg strength Seated shoulder flexion AROM  2X 5 reps bilat  Seated clams green 2X10 bilat Seated chest press green 2X10 Seated rows green 2X10 Seated shoulder abduction AROM X 10 reps bilat Sit to stand 2 X 5 reps with UE support UBE X 4 min for shoulder ROM and endurance, with one rest break required Ambulation 25 feet X 3 with RW and supervision   PATIENT EDUCATION: Education details: HEP, PT plan of care, selfcare Person educated: Patient Education method: Explanation, Demonstration, Verbal cues, and Handouts Education comprehension: verbalized understanding, further education recommended   HOME EXERCISE PROGRAM: Access Code: [email or web address] URL: [email or web address] Date: 11/03/2023 Prepared by: Redell Moose  Exercises - Supine Shoulder Flexion Extension AAROM with Dowel  - 2 x daily - 6 x weekly - 1 sets - 10 reps - 5 sec hold - Seated Shoulder External Rotation AAROM with  Dowel  - 2 x daily - 6 x weekly - 1 sets - 10 reps - 5 sec hold - Seated Bilateral Shoulder Flexion Towel Slide at Table Top  - 2 x daily - 6 x weekly - 1 sets - 10 reps - 5 sec hold - Seated Shoulder Abduction Towel Slide at Table Top with Forearm in Neutral  - 4-5 x daily - 6 x weekly - 1 sets - 10 reps - 5 hold - Seated Long Arc Quad  - 2 x daily - 6 x weekly - 2 sets - 10 reps - alternating marching  - 2 x daily - 6 x weekly - 1-2 sets - 10 reps - Standing Hip Abduction with Counter Support  - 2 x daily - 6 x weekly - 1-2 sets - 10 reps  ASSESSMENT:  CLINICAL IMPRESSION: She has one more visit left out of her initial authorization of 6 visits so we will update measurements and write progress report next visit. She remains limited by pain, weakness, fatigue, and stiffness.   Per Eval: Patient referred  to PT for Bilateral shoulder pain and stiffness (concern for adhesive capsulitis) as well as Right hip pain that could be lumbar radiculpathy. She had a right hip replacement back in August 2022. Recent hospitilization 10/31/23 due to deconditioning and pain all over. She remains deconditioned and has generalized weakness.Patient will benefit from skilled PT to improve overall function and to address impairments and limitations listed below.  OBJECTIVE IMPAIRMENTS: decreased activity tolerance for ADL's, difficulty walking, decreased balance, decreased endurance, decreased mobility, decreased ROM, decreased strength, impaired flexibility, impaired UE/LE use, and pain.  ACTIVITY LIMITATIONS: bending, lifting, carry, reaching, locomotion, cleaning, community activity, driving, and or occupation  PERSONAL FACTORS: see above PMH  also affecting patient's functional outcome.  REHAB POTENTIAL: Fair    CLINICAL DECISION MAKING: Evolving/moderate complexity  EVALUATION COMPLEXITY: Moderate    GOALS: Short term PT Goals Target date: 12/01/2023   Pt will be I and compliant with HEP. Baseline:   Goal status: New Pt will decrease pain by 25% overall Baseline: Goal status: New  Long term PT goals Target date:12/15/2023  Pt will improve shoulder AROM to [facility] (at least 90 deg flexion and abd)  to improve functional mobility Baseline: Goal status: New Pt will improve  strength to at least 4+/5 MMT to improve functional strength Baseline: Goal status: New Pt will improve Patient specific functional scale (PSFS) to at least 7/10 to show improved function level Baseline:4.33 Goal status: New Pt will reduce pain to overall less than 3/10 with usual activity and walking >84min Baseline: Goal status: New Pt will improve Timed up and go (TUG) time to less than 20 seconds with LRAD to show improved gait speed and balance Baseline:40 sec Goal status: New  PLAN: PT FREQUENCY: 1-3 times per week   PT DURATION: 6-8 weeks  PLANNED INTERVENTIONS (unless contraindicated):  97110-Therapeutic exercises, 97530- Therapeutic activity, [id number]- Neuromuscular re-education, 97535- Self Care, 02859- Manual therapy, [id number]- Gait training, H9716- Electrical stimulation (unattended), [id number]- Ultrasound, 02987- Traction (mechanical), and 97033- Ionotophoresis 4mg /ml Dexamethasone  PLAN FOR NEXT SESSION: Progress note NEXT MD VISIT: nothing scheduled at eval  Redell JONELLE Moose, PT,DPT 11/22/2023, 2:24 PM

## 2023-11-22 NOTE — Transitions of Care (Post Inpatient/ED Visit) (Signed)
 11/22/2023  Per chart review - Patient appears to have only been in ED for 7 hours with no admission - [facility] outreach is not indicated.   Patient ID: Jennifer [name], female   DOB: April 21, 1950, 73 y.o.   MRN: [id number]  Shona Prow RN, CCM [place]  VBCI-Population Health RN Care Manager [phone]

## 2023-11-22 NOTE — Telephone Encounter (Unsigned)
 Copied from CRM #[id number]. Topic: General - Other >> Nov 22, 2023  4:35 PM Mercer PEDLAR wrote: Reason for CRM: Patient called stating that her insurance is requesting doctor's office to send fax stating that 90 day supply is needed for C-pap machine to [facility].

## 2023-11-23 NOTE — Addendum Note (Signed)
 Addended by: Jurgen Groeneveld D on: 11/23/2023 09:45 AM   Modules accepted: Orders

## 2023-11-23 NOTE — Telephone Encounter (Signed)
 Updated order from 10/25/23 to state this in the comments, will get PCP to sign order to fax to [facility].

## 2023-11-23 NOTE — Telephone Encounter (Signed)
 Aware order has been updated and faxed to [facility] [phone]

## 2023-11-24 ENCOUNTER — Ambulatory Visit: Admitting: Physical Therapy

## 2023-11-25 NOTE — Telephone Encounter (Signed)
 Fax received from [employer]

## 2023-11-25 NOTE — Telephone Encounter (Signed)
 Pt called back regarding below:   Reason for CRM: Patient is calling about CPAP order, the supplier said that the order Dr. KANDICE sent wasn't valid. It has to say 90 day supply on the prescription.        Call back #[phone]

## 2023-11-25 NOTE — Telephone Encounter (Signed)
 TC to [facility], they will be faxing over one of their order forms for PCP to sign that has specific [facility] codes and qty's on it.

## 2023-11-26 NOTE — Telephone Encounter (Signed)
 Form faxed back to [facility]

## 2023-12-02 ENCOUNTER — Ambulatory Visit (HOSPITAL_COMMUNITY)
Admission: RE | Admit: 2023-12-02 | Discharge: 2023-12-02 | Disposition: A | Discharge status: Home / Self Care | Source: Ambulatory Visit | Attending: Acute Care | Admitting: Acute Care

## 2023-12-02 DIAGNOSIS — Z122 Encounter for screening for malignant neoplasm of respiratory organs: Secondary | ICD-10-CM | POA: Insufficient documentation

## 2023-12-02 DIAGNOSIS — F17210 Nicotine dependence, cigarettes, uncomplicated: Secondary | ICD-10-CM | POA: Insufficient documentation

## 2023-12-02 DIAGNOSIS — Z87891 Personal history of nicotine dependence: Secondary | ICD-10-CM | POA: Insufficient documentation

## 2023-12-03 ENCOUNTER — Encounter: Admitting: Physical Therapy

## 2023-12-06 DIAGNOSIS — R531 Weakness: Secondary | ICD-10-CM | POA: Diagnosis not present

## 2023-12-06 DIAGNOSIS — R0600 Dyspnea, unspecified: Secondary | ICD-10-CM | POA: Diagnosis not present

## 2023-12-06 DIAGNOSIS — R5383 Other fatigue: Secondary | ICD-10-CM | POA: Diagnosis not present

## 2023-12-07 ENCOUNTER — Telehealth: Payer: Self-pay | Admitting: Family Medicine

## 2023-12-07 DIAGNOSIS — R531 Weakness: Secondary | ICD-10-CM

## 2023-12-07 NOTE — Telephone Encounter (Signed)
 Referral palced

## 2023-12-07 NOTE — Telephone Encounter (Signed)
 Does pt ntbs?  Copied from CRM [id number]. Topic: Referral - Question >> Dec 07, 2023 10:13 AM DeAngela L wrote: Reason for CRM: Patient was told if she was not feeling any better after her than call to neurologist at [facility] [phone] and after she called them they asked if she could have her provider send a referral to their office, she is asking if her provider could send a referral to neurologist office cause she is not feel better and really weak

## 2023-12-08 ENCOUNTER — Ambulatory Visit: Payer: Self-pay

## 2023-12-08 NOTE — Telephone Encounter (Signed)
    FYI Only or Action Required?: Action required by provider: pt requesting neuro referral for Clinic in [facility] for numbness & tingling - please call pt.  Patient was last seen in primary care on 11/16/2023 by Deitra Morton Sebastian Nena, NP.  Called Nurse Triage reporting Extremity Weakness.  Symptoms began about a month ago.  Interventions attempted: Nothing.  Symptoms are: gradually worsening.  Triage Disposition: See PCP Within 2 Weeks  Patient/caregiver understands and will follow disposition?: No, wishes to speak with PCP Patient states she is not feeling well even after going to three different hospitals.; Patient is having generalized weakness. Patient is awaiting a callback from the nurse on staff.  Callback Number: [phone]   Reason for Disposition  Weakness is a chronic symptom (recurrent or ongoing AND present > 4 weeks)  [1] MILD pain (e.g., does not interfere with normal activities) AND [2] present > 7 days  Answer Assessment - Initial Assessment Questions 1. DESCRIPTION: Describe how you are feeling.     Tired, fatigued, weak 2. SEVERITY: How bad is it?  Can you stand and walk?     Moderate to severe: uses bed & chair to pull herself out of bed, not able to stand alone need support of walker 3. ONSET: When did these symptoms begin? (e.g., hours, days, weeks, months)     X 1 month 4. CAUSE: What do you think is causing the weakness or fatigue? (e.g., not drinking enough fluids, medical problem, trouble sleeping)     Drinking fluids 5. NEW MEDICINES:  Have you started on any new medicines recently? (e.g., opioid pain medicines, benzodiazepines, muscle relaxants, antidepressants, antihistamines, neuroleptics, beta blockers)  Ozempic started in May 2025 6. OTHER SYMPTOMS: Do you have any other symptoms? (e.g., chest pain, fever, cough, SOB, vomiting, diarrhea, bleeding, other areas of pain)     SOB ongoing since earlier this year 7. PREGNANCY: Is  there any chance you are pregnant? When was your last menstrual period?     na  Pt states can hardly walk: now has to walk with walker.  pt Neurology clinic in [facility]  for numbness and tingling in fingers.  Pt stated she was not calling in for an appointment today: pt only wants a referral  Answer Assessment - Initial Assessment Questions 1. ONSET: When did the pain start?     X month 2. LOCATION: Where is the pain located?     Bilateral arm and some shoulder pain, hand/finger pain 3. PAIN: How bad is the pain? (Scale 0-10; or none, mild, moderate, severe)     severe 4. WORK OR EXERCISE: Has there been any recent work or exercise that involved this part of the body?     na 5. CAUSE: What do you think is causing the arm pain?     unknown 6. OTHER SYMPTOMS: Do you have any other symptoms? (e.g., neck pain, swelling, rash, fever, numbness, weakness)     Can't raise arms above her head, weakness, hands have numbness, tingling, pt not able to 7. PREGNANCY: Is there any chance you are pregnant? When was your last menstrual period?     Na  Pt states things are now dropping out of her hands: loosing grip.  Numbness and tingling to hands/fingers bilateral  Protocols used: Weakness (Generalized) and Fatigue-A-AH, Arm Pain-A-AH

## 2023-12-09 ENCOUNTER — Telehealth: Payer: Self-pay | Admitting: Family Medicine

## 2023-12-09 NOTE — Telephone Encounter (Signed)
 R/C to [facility]. States if it's about the referral, she already has the information per E2C2.

## 2023-12-09 NOTE — Telephone Encounter (Signed)
 Patient aware referral has been placed. She wishes she could be seen right now and dreads the wait.

## 2023-12-09 NOTE — Telephone Encounter (Signed)
 Copied from CRM [id number]. Topic: General - Call Back - No Documentation >> Dec 08, 2023  4:26 PM Brittney F wrote: Reason for CRM:   Patient called in after missing a call from an LPN in the office;  Per CAL, the patient will receive a callback.   Callback Number: [phone]

## 2023-12-09 NOTE — Telephone Encounter (Signed)
Patient aware referral has been placed.

## 2023-12-21 ENCOUNTER — Telehealth: Payer: Self-pay

## 2023-12-21 DIAGNOSIS — Z87891 Personal history of nicotine dependence: Secondary | ICD-10-CM

## 2023-12-21 DIAGNOSIS — R911 Solitary pulmonary nodule: Secondary | ICD-10-CM

## 2023-12-21 NOTE — Telephone Encounter (Signed)
 IMPRESSION: 1. Lung-RADS 3, probably benign findings. Short-term follow-up in 6 months is recommended with repeat low-dose chest CT without contrast (please use the following order, CT CHEST LCS NODULE FOLLOW-UP W/O CM). Dominant 6.7 mm peripheral left lower lobe pulmonary nodule. 2. Three-vessel coronary atherosclerosis. 3. Mild diffuse hepatic steatosis. 4. Aortic Atherosclerosis (ICD10-I70.0) and Emphysema (ICD10-J43.9).

## 2023-12-21 NOTE — Telephone Encounter (Signed)
 Called and spoke with patient and reviewed lung screening CT results. Small lung nodules noted, No comparison CT available. Coronary calcifications noted. Pt is on Fenofibrate  at this time. Recommendation is to repeat CT in 6 months. Pt verbalized understanding and is in agreement. CT results sent to PCP. Order placed for 6 month nodule follow up CT.

## 2023-12-21 NOTE — Telephone Encounter (Signed)
LVM to review results.

## 2023-12-23 ENCOUNTER — Other Ambulatory Visit: Payer: Self-pay | Admitting: Podiatry

## 2023-12-23 ENCOUNTER — Other Ambulatory Visit: Payer: Self-pay | Admitting: Family Medicine

## 2023-12-23 ENCOUNTER — Telehealth: Payer: Self-pay | Admitting: Podiatry

## 2023-12-23 DIAGNOSIS — E1169 Type 2 diabetes mellitus with other specified complication: Secondary | ICD-10-CM

## 2023-12-23 DIAGNOSIS — E1159 Type 2 diabetes mellitus with other circulatory complications: Secondary | ICD-10-CM

## 2023-12-23 DIAGNOSIS — E1151 Type 2 diabetes mellitus with diabetic peripheral angiopathy without gangrene: Secondary | ICD-10-CM

## 2023-12-23 MED ORDER — FENOFIBRATE 48 MG PO TABS: 48.0000 mg | ORAL_TABLET | Freq: Every day | ORAL | 1 refills | Status: DC

## 2023-12-23 NOTE — Telephone Encounter (Signed)
 Copied from CRM [id number]. Topic: Clinical - Medication Refill >> Dec 23, 2023 12:27 PM Donna E wrote: Medication:  fenofibrate  (TRICOR ) 48 MG tablet meloxicam  (MOBIC ) 15 MG tablet metFORMIN  (GLUCOPHAGE ) 500 MG tablet triamterene -hydrochlorothiazide  (MAXZIDE -25) 37.5-25 MG tablet  Has the patient contacted their pharmacy? Yes Pharmacy stated to call provider  This is the patient's preferred pharmacy:  [facility] [address] [place] - [address] [place] [address] Phone: [phone] Fax: [phone]  Is this the correct pharmacy for this prescription? Yes If no, delete pharmacy and type the correct one.   Has the prescription been filled recently? Yes  Is the patient out of the medication? No  Has the patient been seen for an appointment in the last year OR does the patient have an upcoming appointment? Yes  Can we respond through MyChart? No  Agent: Please be advised that Rx refills may take up to 3 business days. We ask that you follow-up with your pharmacy.

## 2023-12-23 NOTE — Telephone Encounter (Signed)
 Patient is requesting refill for Meloxicam  at [facility] in [facility]

## 2023-12-23 NOTE — Telephone Encounter (Signed)
 Metformin  and Maxzide  sent in earlier today. I sent in the Fenofibrate . The Meloxicam  needs to be requested from her podiatrist, Dr Janit. Pt aware.

## 2023-12-24 ENCOUNTER — Other Ambulatory Visit: Payer: Self-pay | Admitting: Podiatry

## 2023-12-24 MED ORDER — MELOXICAM 15 MG PO TABS: 15.0000 mg | ORAL_TABLET | Freq: Every day | ORAL | 1 refills | Status: DC

## 2024-01-10 ENCOUNTER — Other Ambulatory Visit (INDEPENDENT_AMBULATORY_CARE_PROVIDER_SITE_OTHER)

## 2024-01-10 ENCOUNTER — Encounter: Payer: Self-pay | Admitting: Physician Assistant

## 2024-01-10 ENCOUNTER — Ambulatory Visit (INDEPENDENT_AMBULATORY_CARE_PROVIDER_SITE_OTHER): Admitting: Physician Assistant

## 2024-01-10 DIAGNOSIS — M25512 Pain in left shoulder: Secondary | ICD-10-CM

## 2024-01-10 DIAGNOSIS — G8929 Other chronic pain: Secondary | ICD-10-CM

## 2024-01-10 DIAGNOSIS — M19012 Primary osteoarthritis, left shoulder: Secondary | ICD-10-CM | POA: Diagnosis not present

## 2024-01-10 NOTE — Progress Notes (Signed)
 Office Visit Note    Q    Date of Birth: 07-10-1950           MRN: [id number] Visit Date: 01/10/2024              Requested by: Jolinda Norene HERO, DO [address] [place],  [place] [address] PCP: Jolinda Norene HERO, DO   Assessment & Plan: Visit Diagnoses:  1. Chronic left shoulder pain     Plan: Will send her for left shoulder intra-articular injection under ultrasound with Dr. Burnetta.  She can follow-up with us  on an as-needed basis for the shoulder.  Questions were encouraged and answered at length.  Follow-Up Instructions: Return if symptoms worsen or fail to improve.   Orders:  Orders Placed This Encounter  Procedures   XR Shoulder Left   No orders of the defined types were placed in this encounter.     Procedures: No procedures performed   Clinical Data: No additional findings.   Subjective: Chief Complaint  Patient presents with   Left Shoulder - Pain    HPI Jennifer [name] comes in today for left shoulder pain that began in June.  She states she has been told by therapy that she has a frozen shoulder on the left.  She is also dealing with generalized overall weakness in the upper extremities and lower legs.  She has had multiple visits to the ER for this and has had workups done without any definitive diagnosis.  She is due to see neurology in October.  She states she struggles to walk has numbness tingling in both hands.  She states that her knees give way on her and she is only able to walk very short distances.  She is diabetic reports good control of her diabetes with last hemoglobin A1c being around 6. Review of Systems See HPI otherwise negative  Objective: Vital Signs: There were no vitals taken for this visit.  Physical Exam Constitutional:      Appearance: She is not ill-appearing or diaphoretic.  Cardiovascular:     Pulses: Normal pulses.  Neurological:     Mental Status: She is alert and oriented to person, place, and time.  Psychiatric:         Mood and Affect: Mood normal.     Ortho Exam Lower extremities: Able to form single leg raise bilaterally.  However has difficulty flexing hips bilaterally.  No significant pain with range of motion of either hip. Upper extremities: Active forward flexion right shoulder 90 degrees left 85 degrees passively right shoulder be brought under 70 degrees and left shoulder 110.  Bilateral external rotation both shoulders against resistance 3 out of 5 in internal strength to 4 out of 5.  Left shoulder internal/external rotation reveals crepitus and causes significant pain.  No impingement on the right.  Unable to perform liftoff exam bilaterally.  Bilateral hand she has difficulty touching thumb to the tips of her fingers this takes a significant amount of time both hands.  She is able to perform thumbs up bilaterally able to abduct her fingers. Specialty Comments:  No specialty comments available.  Imaging: XR Shoulder Left Result Date: 01/10/2024 Left shoulder 3 views shows severe end-stage arthritis with loss of glenohumeral joint space and subacromial space of the left shoulder.  No acute fractures acute findings.  Shoulder is well located.    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 08/11/2017   Hyperlipidemia associated with type 2 diabetes mellitus (HCC) 08/11/2017   Tobacco use disorder 08/11/2017   History of adenomatous  polyp of colon 07/23/2016   S/P total knee arthroplasty 02/12/2014   Past Medical History:  Diagnosis Date   Arthritis    Dyspnea    Hyperlipidemia    Hypertension    Pre-diabetes    Sleep apnea     Family History  Problem Relation Age of Onset   Diabetes Mother    Stroke Mother    Alcohol abuse Father    Stroke Father    Colon cancer Neg Hx     Past Surgical History:  Procedure Laterality Date   ABDOMINAL HYSTERECTOMY     COLONOSCOPY N/A 11/25/2016   Procedure: COLONOSCOPY;  Surgeon: Golda Claudis PENNER, MD;  Location: [facility];  Service: Endoscopy;  Laterality: N/A;  830   EYE SURGERY     FOOT SURGERY Left 2018   reconstructed tendon   KNEE ARTHROSCOPY     BIL   2010    LUNG BIOPSY     RIGHT 30 YRS AGO    TOTAL HIP ARTHROPLASTY Right 12/17/2020   Procedure: RIGHT TOTAL HIP ARTHROPLASTY ANTERIOR APPROACH;  Surgeon: Vernetta Lonni GRADE, MD;  Location: [facility] OR;  Service: Orthopedics;  Laterality: Right;   TOTAL KNEE ARTHROPLASTY Right 02/12/2014   Procedure: TOTAL KNEE ARTHROPLASTY;  Surgeon: Marcey Raman, MD;  Location: [facility] OR;  Service: Orthopedics;  Laterality: Right;   Social History   Occupational History   Occupation: CNa    Employer: [employer]  Tobacco Use   Smoking status: Every Day    Current packs/day: 0.50    Average packs/day: 0.5 packs/day for 85.7 years (42.9 ttl pk-yrs)    Types: Cigarettes    Start date: 04/26/1973    Passive exposure: Never   Smokeless tobacco: Never  Vaping Use   Vaping status: Never Used  Substance and Sexual Activity   Alcohol use: No   Drug use: No   Sexual activity: Yes

## 2024-01-12 ENCOUNTER — Ambulatory Visit: Payer: Self-pay | Admitting: Emergency Medicine

## 2024-01-13 ENCOUNTER — Telehealth: Payer: Self-pay

## 2024-01-13 NOTE — Telephone Encounter (Signed)
   In process of completing Novo Nordisk refills for patients OZEMPIC  medication.  Application emailed to JULIE P for signature.

## 2024-01-20 ENCOUNTER — Telehealth: Payer: Self-pay | Admitting: Pharmacist

## 2024-01-20 NOTE — Telephone Encounter (Signed)
 Assisted in completion of novo nordisk patient assistance application (ozempic ) for patient.  Patient and provider signatures captured, reviewed and faxed back to CPhT.  Medication list reviewed and FYI tab updated.   Arlen Legendre Dattero Sharee Sturdy, PharmD, BCACP, CPP Clinical Pharmacist, [facility]

## 2024-01-25 ENCOUNTER — Encounter: Payer: Self-pay | Admitting: Sports Medicine

## 2024-01-25 ENCOUNTER — Other Ambulatory Visit: Payer: Self-pay

## 2024-01-25 ENCOUNTER — Ambulatory Visit: Admitting: Sports Medicine

## 2024-01-25 DIAGNOSIS — M19012 Primary osteoarthritis, left shoulder: Secondary | ICD-10-CM

## 2024-01-25 DIAGNOSIS — M25512 Pain in left shoulder: Secondary | ICD-10-CM | POA: Diagnosis not present

## 2024-01-25 DIAGNOSIS — E1151 Type 2 diabetes mellitus with diabetic peripheral angiopathy without gangrene: Secondary | ICD-10-CM | POA: Diagnosis not present

## 2024-01-25 DIAGNOSIS — G8929 Other chronic pain: Secondary | ICD-10-CM | POA: Diagnosis not present

## 2024-01-25 MED ORDER — LIDOCAINE HCL 1 % IJ SOLN
2.0000 mL | INTRAMUSCULAR | Status: AC | PRN
  Administered 2024-01-25: 2 mL

## 2024-01-25 MED ORDER — BUPIVACAINE HCL 0.25 % IJ SOLN
2.0000 mL | INTRAMUSCULAR | Status: AC | PRN
  Administered 2024-01-25: 2 mL via INTRA_ARTICULAR

## 2024-01-25 MED ORDER — METHYLPREDNISOLONE ACETATE 40 MG/ML IJ SUSP
60.0000 mg | INTRAMUSCULAR | Status: AC | PRN
  Administered 2024-01-25: 60 mg via INTRA_ARTICULAR

## 2024-01-25 NOTE — Telephone Encounter (Signed)
 Completed refill form faxed to novo nordisk 01/25/24.

## 2024-01-25 NOTE — Progress Notes (Signed)
 Office & Procedure Note  Patient: Jennifer [name]             Date of Birth: April 28, 1950           MRN: [id number]             Visit Date: 01/25/2024  HPI: Saidee is a pleasant 73 year old female who presents with acute on chronic left shoulder pain.  She has been seen by physical therapy in the past for the shoulder who believe she had frozen shoulder.  Previously saw Tory Gaskins at the clinic we did obtain x-rays demonstrating severe osteoarthritic change.  Check him for consideration of injection.  He also has some questions regarding exact etiology of her shoulder pain.  She is a type II diabetic, although A1c recently well-controlled.  She is managed on metformin  500 mg daily. Lab Results  Component Value Date   HGBA1C 6.3 (H) 10/25/2023   PE: - There is generalized tenderness about the shoulder but no joint effusion.  There is marked restriction both active and passively, other more so crepitus with all planes of motion indicative of osteoarthritis.  I am able to take her further passively in all planes, so favor OA versus true adhesive capsulitis. + Drop arm test.  Imaging:  *3 views of the left shoulder from 01/10/2024 was independently reviewed and interpreted by myself today, AP, axial and scapular Y views were visualized with severe, end-stage osteoarthritic change with remarkable osteophytosis and spurring.  There is superior head migration with minimal subacromial outlet space noted as well.  No acute fracture noted, there is decreased bone mineral density noted.   Visit Diagnoses:  1. Primary osteoarthritis of left shoulder   2. Chronic left shoulder pain   3. Type 2 diabetes, with peripheral circulatory disorder not at goal [facility])    Procedures: Large Joint Inj: L glenohumeral on 01/25/2024 1:01 PM Indications: pain Details: 22 G 3.5 in needle, ultrasound-guided posterior approach Medications: 2 mL lidocaine  1 %; 2 mL bupivacaine  0.25 %; 60 mg methylPREDNISolone  acetate 40  MG/ML Outcome: tolerated well, no immediate complications  US -guided glenohumeral joint injection, Left shoulder After discussion on risks/benefits/indications, informed verbal consent was obtained. A timeout was then performed. The patient was positioned lying lateral recumbent on examination table. The patient's shoulder was prepped with betadine  and multiple alcohol swabs  and utilizing ultrasound guidance, the patient's glenohumeral joint was identified on ultrasound. Using ultrasound guidance a 22-gauge, 3.5 inch needle with a mixture of 2:2:1.5 cc's lidocaine :bupivicaine:depomedrol was directed from a lateral to medial direction via in-plane technique into the glenohumeral joint with visualization of appropriate spread of injectate into the joint. Patient tolerated the procedure well without immediate complications.      Procedure, treatment alternatives, risks and benefits explained, specific risks discussed. Consent was given by the patient. Immediately prior to procedure a time out was called to verify the correct patient, procedure, equipment, support staff and site/side marked as required. Patient was prepped and draped in the usual sterile fashion.     Plan: -Impression is acute on chronic left shoulder pain with x-ray confirms severe osteoarthritis, discussed with [place] this is more of a flare of her OA.  I can take her further passively so I do not think this is frozen shoulder.  She does have superior head migration likely underlying rotator cuff arthropathy in addition. - Discussed after shared decision making, ultrasound-guided intra-articular shoulder injection, patient tolerated well. - Advised on postinjection protocol, may use Tylenol  and or ice  in the short-term.  After 48-72 hours of modified activity, she may resume her home rehab exercises she was shown at PT - She is diabetic, did discuss transient glucose rise, she may continue checking his sugars on an every other day  basis, but he is not on insulin so likely will not need any short-term intervention.  She will continue her metformin  500 mg daily and tight glucose control otherwise - I will see her back on an as-needed basis, she will follow-up in 1 month with Tory Gaskins if she does not have significant good relief.  Next step would be consideration of reverse total shoulder arthroplasty, but comorbidities may limit this  Lonell Sprang, DO Primary Care Sports Medicine Physician  [facility] - Orthopedics  This note was dictated using Dragon naturally speaking software and may contain errors in syntax, spelling, or content which have not been identified prior to signing this note.

## 2024-01-28 DIAGNOSIS — G629 Polyneuropathy, unspecified: Secondary | ICD-10-CM | POA: Diagnosis not present

## 2024-01-28 DIAGNOSIS — G959 Disease of spinal cord, unspecified: Secondary | ICD-10-CM | POA: Diagnosis not present

## 2024-01-28 DIAGNOSIS — R531 Weakness: Secondary | ICD-10-CM | POA: Diagnosis not present

## 2024-01-28 DIAGNOSIS — M542 Cervicalgia: Secondary | ICD-10-CM | POA: Diagnosis not present

## 2024-01-28 DIAGNOSIS — R292 Abnormal reflex: Secondary | ICD-10-CM | POA: Diagnosis not present

## 2024-01-31 ENCOUNTER — Ambulatory Visit: Payer: Self-pay | Admitting: Family Medicine

## 2024-01-31 ENCOUNTER — Telehealth: Payer: Self-pay | Admitting: Family Medicine

## 2024-01-31 NOTE — Telephone Encounter (Signed)
 Copied from CRM [id number]. Topic: Clinical - Order For Equipment >> Jan 31, 2024  2:11 PM Jennifer [name] wrote: Reason for CRM: Patient is wanting to know if PCP can put in orders for a Rollater walker  with a seat- she is having trouble getting around and fell recently (this past Friday). Patient also has questions regarding a rehabilitation service that she can go to for help. Please advise # [phone]  NT regarding fall, pain in left hip, knee and head

## 2024-01-31 NOTE — Telephone Encounter (Signed)
 FYI Only or Action Required?: FYI only for provider.  Patient was last seen in primary care on 11/16/2023 by Deitra Morton Sebastian Nena, NP.  Called Nurse Triage reporting Fall.  Symptoms began several days ago.  Triage Disposition: See PCP When Office is Open (Within 3 Days)  Patient/caregiver understands and will follow disposition?: Yes           Copied from CRM [id number]. Topic: Clinical - Red Word Triage >> Jan 31, 2024  2:18 PM Sophia H wrote: Red Word that prompted transfer to Nurse Triage: Patient fell on Friday (10/10). States she has been having pain in her head, left hip & knee. Reason for Disposition  MILD weakness (e.g., does not interfere with ability to work, go to school, normal activities)  (Exception: Mild weakness is a chronic symptom.)  Answer Assessment - Initial Assessment Questions This RN scheduled pt for an appointment tmrw with PCP in office. This RN educated pt on new-worsening symptoms and when to call back. Pt verbalized understanding and agrees to plan.   Pt fell on Fri (10/10) when trying to get to toilet Pt states she has difficulty holding onto things; fingers tingle Intermittent headaches  LOCATION: What part of the body hit the ground? (e.g., back, buttocks, head, hips, knees, hands, head, stomach)     Hit head on dresser  Denies losing consciousness  INJURY: Did you hurt (injure) yourself when you fell? If Yes, ask: What did you injure? Tell me more about this? (e.g., body area; type of injury; pain severity)     Left hip pain, left knee pain  Protocols used: Falls and [facility]

## 2024-02-01 ENCOUNTER — Encounter: Payer: Self-pay | Admitting: Family Medicine

## 2024-02-01 ENCOUNTER — Ambulatory Visit (INDEPENDENT_AMBULATORY_CARE_PROVIDER_SITE_OTHER): Admitting: Family Medicine

## 2024-02-01 VITALS — BP 107/72 | HR 76 | Temp 98.3°F | Ht 64.0 in | Wt 198.0 lb

## 2024-02-01 DIAGNOSIS — E1151 Type 2 diabetes mellitus with diabetic peripheral angiopathy without gangrene: Secondary | ICD-10-CM | POA: Diagnosis not present

## 2024-02-01 DIAGNOSIS — Z23 Encounter for immunization: Secondary | ICD-10-CM

## 2024-02-01 DIAGNOSIS — E871 Hypo-osmolality and hyponatremia: Secondary | ICD-10-CM | POA: Diagnosis not present

## 2024-02-01 DIAGNOSIS — R296 Repeated falls: Secondary | ICD-10-CM

## 2024-02-01 DIAGNOSIS — E785 Hyperlipidemia, unspecified: Secondary | ICD-10-CM

## 2024-02-01 DIAGNOSIS — E1169 Type 2 diabetes mellitus with other specified complication: Secondary | ICD-10-CM | POA: Diagnosis not present

## 2024-02-01 DIAGNOSIS — R531 Weakness: Secondary | ICD-10-CM

## 2024-02-01 DIAGNOSIS — E1159 Type 2 diabetes mellitus with other circulatory complications: Secondary | ICD-10-CM

## 2024-02-01 DIAGNOSIS — Z5982 Transportation insecurity: Secondary | ICD-10-CM

## 2024-02-01 DIAGNOSIS — I152 Hypertension secondary to endocrine disorders: Secondary | ICD-10-CM | POA: Diagnosis not present

## 2024-02-01 DIAGNOSIS — G959 Disease of spinal cord, unspecified: Secondary | ICD-10-CM | POA: Diagnosis not present

## 2024-02-01 LAB — BAYER DCA HB A1C WAIVED: HB A1C (BAYER DCA - WAIVED): 5.9 % — ABNORMAL HIGH (ref 4.8–5.6)

## 2024-02-01 NOTE — Patient Instructions (Addendum)
 How do I set up transportation? There is no need to come to the office to make transportation arrangements. For your convenience, we are able to take your reservation over the phone. Call [phone] to schedule a ride or obtain more information. Due to seating capacity, fleeta availability, and route arrangements, please make note of the following:   Three (3) working day notice is required in advance of appointments for all in-county reservation requests  Five (5) working day notice is required in advance of appointments for all out-of-county reservation requests  DIAL [phone] TO SPEAK WITH A TRANSPORTATION REPRESENTATIVE.  DIAL #[id number] FOR [place]  [employer] FOR THE [employer]ARING IMPAIRED  Aging, Disability & Transit Services of [place] operates [employer] and the [employer] bus, which both combine to create a seamless [employer]. Public transportation is an essential part of connecting communities by providing  service to those residents who need access to services such as health care, work, school, and other life-sustaining resources. The system provides safe and reliable transportation services to contracted agencies and the general public. Transportation services receive funding assistance and oversight support from the [place]  Education officer, community and the [employer].  The [employer] fleet provides services under contract for the general public and under contract, the majority of the [employer] and [employer] in [place]. Typical destinations include medical facilities, rehabilitation and treatment centers, educational facilities other than K-12 schools, employment, and grocery shopping. Fares may apply depending upon eligibility and destination. Time and service restrictions may exist due to limited levels of funding.

## 2024-02-01 NOTE — Progress Notes (Addendum)
 Subjective: CC: Rolling walker, DM PCP: Jolinda Norene HERO, DO YEP:Yjszo W Dorsi is a 73 y.o. female presenting to clinic today for:  Type 2 Diabetes with hypertension, hyperlipidemia:  She is compliant with her medications.  She does not report any hypoglycemic episodes, nausea, vomiting or abdominal pain.   Diabetes Health Maintenance Due  Topic Date Due   OPHTHALMOLOGY EXAM  02/23/2024   FOOT EXAM  04/26/2024   HEMOGLOBIN A1C  04/26/2024    Frequent falls, generalized weakness She is currently being worked up by neurology in [place].  Has upcoming imaging study.  Concerns for myelopathy.  She reports ongoing generalized weakness with difficulty performing IADLs.  She needs assistance often with ambulation and is requesting a rolling walker with seat today as she has had recent fall where she hit her face about a week ago.  She does not report any blurred vision, dizziness or headache but has a little tenderness along the left cheekbone and right eyebrow.  She reports increasing difficulty with transportation.  Her husband has not been reliable for help at home and they are currently in a disagreement.  Sometimes she will only eat a couple of eggs all day long because he will not help her.  She has a bedside potty.  Would be amenable to transport wheelchair.  Her daughter helps when she can.  ROS: Per HPI  Allergies  Allergen Reactions   Oxycodone  Nausea And Vomiting    Pt stated, It makes me not want to eat   Hydrocodone  Itching   Past Medical History:  Diagnosis Date   Arthritis    Dyspnea    Hyperlipidemia    Hypertension    Pre-diabetes    Sleep apnea     Current Outpatient Medications:    acetaminophen  (TYLENOL ) 500 MG tablet, Take 1,000 mg by mouth 2 (two) times daily as needed for mild pain or moderate pain. , Disp: , Rfl:    atenolol  (TENORMIN ) 25 MG tablet, Take 1 tablet by mouth once daily, Disp: 100 tablet, Rfl: 1   atorvastatin  (LIPITOR) 80 MG  tablet, Take 1 tablet (80 mg total) by mouth at bedtime., Disp: 100 tablet, Rfl: 3   budeson-glycopyrrolate -formoterol (BREZTRI  AEROSPHERE) 160-9-4.8 MCG/ACT AERO, Inhale 2 puffs into the lungs 2 (two) times daily. Rinse mouth out after, Disp: 32.1 g, Rfl: 5   Coenzyme Q-10 100 MG capsule, Take 2 capsules (200 mg total) by mouth daily., Disp: 60 capsule, Rfl: 6   fenofibrate  (TRICOR ) 48 MG tablet, Take 1 tablet (48 mg total) by mouth daily. For triglycerides, Disp: 100 tablet, Rfl: 1   meloxicam  (MOBIC ) 15 MG tablet, Take 1 tablet (15 mg total) by mouth daily., Disp: 60 tablet, Rfl: 1   metFORMIN  (GLUCOPHAGE ) 500 MG tablet, Take 1 tablet by mouth once daily with breakfast, Disp: 100 tablet, Rfl: 1   Multiple Vitamins-Minerals (ADULT GUMMY PO), Take 1 capsule by mouth daily., Disp: , Rfl:    polyethylene glycol (MIRALAX  / GLYCOLAX ) 17 g packet, Take 17 g by mouth daily as needed., Disp: , Rfl:    Semaglutide,0.25 or 0.5MG /DOS, (OZEMPIC, 0.25 OR 0.5 MG/DOSE,) 2 MG/1.5ML SOPN, Inject 0.5 mg into the skin once a week., Disp: , Rfl:    triamterene -hydrochlorothiazide  (MAXZIDE -25) 37.5-25 MG tablet, Take 1 tablet by mouth daily., Disp: 100 tablet, Rfl: 1   Vitamin D , Ergocalciferol , (DRISDOL ) 1.25 MG (50000 UNIT) CAPS capsule, Take 1 capsule (50,000 Units total) by mouth every 7 (seven) days. Then follow up for vit  d recheck, Disp: 12 capsule, Rfl: 1 Social History   Socioeconomic History   Marital status: Married    Spouse name: [name]   Number of children: 3   Years of education: 12   Highest education level: 12th grade  Occupational History   Occupation: CNa    Employer: [employer]  Tobacco Use   Smoking status: Every Day    Current packs/day: 0.50    Average packs/day: 0.5 packs/day for 85.8 years (42.9 ttl pk-yrs)    Types: Cigarettes    Start date: 04/26/1973    Passive exposure: Never   Smokeless tobacco: Never  Vaping Use   Vaping status: Never Used  Substance and Sexual Activity    Alcohol use: No   Drug use: No   Sexual activity: Yes  Other Topics Concern   Not on file  Social History Narrative   She lives with her husband   One son passed away from sepsis in 03-15-2020   One son lives with her now   Social Drivers of Health   Financial Resource Strain: Patient Declined (10/06/2023)   Overall Financial Resource Strain (CARDIA)    Difficulty of Paying Living Expenses: Patient declined  Food Insecurity: Patient Declined (01/31/2024)   Hunger Vital Sign    Worried About Running Out of Food in the Last Year: Patient declined    Ran Out of Food in the Last Year: Patient declined  Transportation Needs: Unmet Transportation Needs (01/31/2024)   PRAPARE - Administrator, Civil Service (Medical): Yes    Lack of Transportation (Non-Medical): Patient declined  Physical Activity: Inactive (01/31/2024)   Exercise Vital Sign    Days of Exercise per Week: 0 days    Minutes of Exercise per Session: Not on file  Stress: No Stress Concern Present (01/31/2024)   [employer] of Occupational Health - Occupational Stress Questionnaire    Feeling of Stress: Only a little  Social Connections: Unknown (01/31/2024)   Social Connection and Isolation Panel    Frequency of Communication with Friends and Family: More than three times a week    Frequency of Social Gatherings with Friends and Family: Patient declined    Attends Religious Services: Never    Database Administrator or Organizations: No    Attends Engineer, Structural: Not on file    Marital Status: Patient declined  Intimate Partner Violence: Not At Risk (08/10/2023)   Humiliation, Afraid, Rape, and Kick questionnaire    Fear of Current or Ex-Partner: No    Emotionally Abused: No    Physically Abused: No    Sexually Abused: No   Family History  Problem Relation Age of Onset   Diabetes Mother    Stroke Mother    Alcohol abuse Father    Stroke Father    Colon cancer Neg Hx      Objective: Office vital signs reviewed. BP 107/72   Pulse 76   Temp 98.3 F (36.8 C)   Ht 5' 4 (1.626 m)   Wt 198 lb (89.8 kg) Comment: patient reported  SpO2 96%   BMI 33.99 kg/m   Physical Examination:  General: Awake, alert, nontoxic elderly female, No acute distress HEENT: Healing ecchymosis noted along the right eyebrow and left cheek Cardio: regular rate and rhythm, S1S2 heard, no murmurs appreciated Pulm: clear to auscultation bilaterally, no wheezes, rhonchi or rales; normal work of breathing on room air Extremities: warm, well perfused, No edema, cyanosis or clubbing; +2 pulses bilaterally MSK:  She arrives in wheelchair.  She has some evidence of muscle wasting in the hands bilaterally and legs.  Sensation is intact.       Lab Results  Component Value Date   HGBA1C 6.3 (H) 10/25/2023    Assessment/ Plan: 73 y.o. 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  I have given her both a prescription for rolling walker with seat as well as a transport wheelchair.  Will see if we can get this for delivery for her as she has limited transportation.  Evidence of healing ecchymosis along the face.  She is outside of 72 hours where it was to be suspicious for an intracranial bleed so no advanced imaging obtained.  She, aside from generalized weakness, had no focal neurologic deficits identifiable on exam today.  HH ordered  We went ahead and combined today's appointment with her typical diabetes appointment and labs were obtained which demonstrated controlled type 2 diabetes.  No changes are needed at this time.  I have gone ahead and given her information for the aging and disability services RCATs for transportation and placed a referral to be [facility] to assist with both transportation outside of the county and maybe some home care   Gavina Dildine CHRISTELLA Fielding, DO [facility] [phone]

## 2024-02-01 NOTE — Addendum Note (Signed)
 Addended by: JODENE CORNERS B on: 02/01/2024 04:41 PM   Modules accepted: Orders

## 2024-02-02 ENCOUNTER — Ambulatory Visit: Payer: Self-pay | Admitting: Family Medicine

## 2024-02-02 ENCOUNTER — Telehealth: Payer: Self-pay

## 2024-02-02 LAB — BASIC METABOLIC PANEL WITH GFR
BUN/Creatinine Ratio: 18 (ref 12–28)
BUN: 16 mg/dL (ref 8–27)
CO2: 21 mmol/L (ref 20–29)
Calcium: 10.1 mg/dL (ref 8.7–10.3)
Chloride: 97 mmol/L (ref 96–106)
Creatinine, Ser: 0.88 mg/dL (ref 0.57–1.00)
Glucose: 88 mg/dL (ref 70–99)
Potassium: 4 mmol/L (ref 3.5–5.2)
Sodium: 134 mmol/L (ref 134–144)
eGFR: 71 mL/min/1.73 (ref >59)

## 2024-02-02 NOTE — Progress Notes (Signed)
 Complex Care Management Note  Care Guide Note 02/02/2024 Name: Jennifer [name] MRN: [id number] DOB: Jan 27, 1951  PAUL TORPEY is a 73 y.o. year old female who sees Jolinda Norene HERO, DO for primary care. I reached out to Delayne LELON All by phone today to offer complex care management services.  [name] was given information about Complex Care Management services today including:   The Complex Care Management services include support from the care team which includes your Nurse Care Manager, Clinical Social Worker, or Pharmacist.  The Complex Care Management team is here to help remove barriers to the health concerns and goals most important to you. Complex Care Management services are voluntary, and the patient may decline or stop services at any time by request to their care team member.   Complex Care Management Consent Status: Patient agreed to services and verbal consent obtained.   Follow up plan:  Telephone appointment with complex care management team member scheduled for:  02/14/2024  Encounter Outcome:  Patient Scheduled  .Debbe Fuse [facility] Guide  Direct Dial: [phone]  Fax [phone]

## 2024-02-03 ENCOUNTER — Telehealth: Payer: Self-pay

## 2024-02-03 NOTE — Telephone Encounter (Signed)
 Copied from CRM [id number]. Topic: Clinical - Medication Question >> Feb 03, 2024 10:45 AM Myrick T wrote: Reason for CRM: Ara from [facility] called to see if the fax requesting to have all med refill request send to [facility]. If not recvd please f/u with [facility] at [phone] and fax#[phone]

## 2024-02-03 NOTE — Progress Notes (Signed)
 Order for 4 wheeled walker w/ seat & wheelchair faxed to [facility] [phone] on 02/02/24

## 2024-02-04 NOTE — Telephone Encounter (Signed)
 Called pt to let her know that her Ozempic has arrived.  Also spoke to her asking if she was changing her prescriptions to [facility] and she said she was not. Called [facility] back and informed them of this.

## 2024-02-07 ENCOUNTER — Telehealth: Payer: Self-pay | Admitting: Family Medicine

## 2024-02-07 NOTE — Telephone Encounter (Signed)
 I spoke to pt and advised both DME orders for Manual Wheelchair and 4 wheeled rolling walker with seat were faxed. Advised pt to call the company back and pt voiced understanding. Pt will call us  and let us  know if we need to refax the order.

## 2024-02-07 NOTE — Telephone Encounter (Signed)
 Copied from CRM [id number]. Topic: Clinical - Order For Equipment >> Feb 07, 2024  8:23 AM Cherylann RAMAN wrote: Reason for CRM: Patient called requesting order to change the order from a wheelchair to a Rollator. Patient states that if she had a wheelchair she would never get up and walk. Please contact patient at [phone].

## 2024-02-08 ENCOUNTER — Ambulatory Visit: Admitting: Family Medicine

## 2024-02-09 NOTE — Telephone Encounter (Unsigned)
 Copied from CRM #[id number]. Topic: Clinical - Order For Equipment >> Feb 09, 2024 11:10 AM Delon DASEN wrote: Reason for CRM: Patient needs order resent for rollator

## 2024-02-10 NOTE — Telephone Encounter (Signed)
 Order & notes faxed back to [facility] at [phone] w/ note to reach out to pt as soon as possible at [phone], that this is the 2nd time order has been faxed. If additional information is needed to fax request to [phone] or contact me at [phone].

## 2024-02-11 ENCOUNTER — Telehealth: Payer: Self-pay | Admitting: *Deleted

## 2024-02-11 NOTE — Progress Notes (Signed)
 Complex Care Management Note Care Guide Note  02/11/2024 Name: Jennifer [name] MRN: [id number] DOB: 04-Jul-1950   Complex Care Management Outreach Attempts: An unsuccessful telephone outreach was attempted today to offer the patient information about available complex care management services.  Follow Up Plan:  Additional outreach attempts will be made to offer the patient complex care management information and services.   Encounter Outcome:  No Answer  [facility]enc[facility] Care Guide  Direct Dial:[phone] Fax:[phone] Website: [email or web address]

## 2024-02-14 ENCOUNTER — Telehealth: Payer: Self-pay | Admitting: Family Medicine

## 2024-02-14 ENCOUNTER — Telehealth: Payer: Self-pay | Admitting: *Deleted

## 2024-02-14 ENCOUNTER — Other Ambulatory Visit: Payer: Self-pay | Admitting: Licensed Clinical Social Worker

## 2024-02-14 NOTE — Addendum Note (Signed)
 Addended by: JOLINDA NORENE HERO on: 02/14/2024 04:14 PM   Modules accepted: Orders

## 2024-02-14 NOTE — Telephone Encounter (Signed)
Pt notified.    LS

## 2024-02-14 NOTE — Patient Outreach (Signed)
 Complex Care Management   Visit Note  02/14/2024  Name:  Jennifer [name] MRN: [id number] DOB: 09-Sep-1950  Situation: Referral received for Complex Care Management related to SDOH Barriers:  Transportation Stress I obtained verbal consent from Patient.  Visit completed with Patient  on the phone  Background:   Past Medical History:  Diagnosis Date   Arthritis    Dyspnea    Hyperlipidemia    Hypertension    Pre-diabetes    Sleep apnea     Assessment: Patient Reported Symptoms:  Cognitive Cognitive Status: Able to follow simple commands, Alert and oriented to person, place, and time Cognitive/Intellectual Conditions Management [RPT]: None reported or documented in medical history or problem list   Health Maintenance Behaviors: Annual physical exam Healing Pattern: Average Health Facilitated by: Rest  Neurological Neurological Review of Symptoms: Weakness (Suspected neuromusclar disorder) Neurological Management Strategies: Adequate rest, Coping strategies Neurological Self-Management Outcome: 3 (uncertain) Neurological Comment: MRI appt that she will need to get transportation arranged for through friends/family or pay out of pocket for  HEENT HEENT Symptoms Reported: No symptoms reported HEENT Management Strategies: Routine screening HEENT Self-Management Outcome: 4 (good)    Cardiovascular Cardiovascular Symptoms Reported: Fatigue Does patient have uncontrolled Hypertension?: Yes Cardiovascular Management Strategies: Coping strategies, Routine screening, Medication therapy Cardiovascular Self-Management Outcome: 4 (good)  Respiratory Respiratory Symptoms Reported: Not assesed    Endocrine Endocrine Symptoms Reported: No symptoms reported Is patient diabetic?: Yes    Gastrointestinal Gastrointestinal Symptoms Reported: Not assessed      Genitourinary Genitourinary Symptoms Reported: Not assessed    Integumentary Integumentary Symptoms Reported: Not assessed     Musculoskeletal Musculoskelatal Symptoms Reviewed: Limited mobility, Difficulty walking, Unsteady gait, Weakness Musculoskeletal Management Strategies: Routine screening, Medical device, Adequate rest Musculoskeletal Self-Management Outcome: 3 (uncertain) Falls in the past year?: Yes Number of falls in past year: 2 or more Was there an injury with Fall?: Yes Fall Risk Category Calculator: 3 Patient Fall Risk Level: High Fall Risk    Psychosocial Psychosocial Symptoms Reported: No symptoms reported Behavioral Management Strategies: Coping strategies, Adequate rest Behavioral Health Self-Management Outcome: 4 (good) Major Change/Loss/Stressor/Fears (CP): Medical condition, self Techniques to Cope with Loss/Stress/Change: Diversional activities, Withdraw Quality of Family Relationships: involved Do you feel physically threatened by others?: No    02/14/2024    PHQ2-9 Depression Screening   Little interest or pleasure in doing things Several days  Feeling down, depressed, or hopeless Several days  PHQ-2 - Total Score 2  Trouble falling or staying asleep, or sleeping too much Not at all  Feeling tired or having little energy More than half the days  Poor appetite or overeating  Not at all  Feeling bad about yourself - or that you are a failure or have let yourself or your family down Not at all  Trouble concentrating on things, such as reading the newspaper or watching television Not at all  Moving or speaking so slowly that other people could have noticed.  Or the opposite - being so fidgety or restless that you have been moving around a lot more than usual Not at all  Thoughts that you would be better off dead, or hurting yourself in some way Not at all  PHQ2-9 Total Score 4  If you checked off any problems, how difficult have these problems made it for you to do your work, take care of things at home, or get along with other people    Depression Interventions/Treatment Patient refuses  Treatment (Patient denied needing  resources for depression/stress management today with VBCI LCSW)    There were no vitals filed for this visit.  Medications Reviewed Today     Reviewed by Merlynn Lyle CROME, LCSW (Social Worker) on 02/14/24 at 1437  Med List Status: <None>   Medication Order Taking? Sig Documenting Provider Last Dose Status Informant  acetaminophen  (TYLENOL ) 500 MG tablet [id number]  Take 1,000 mg by mouth 2 (two) times daily as needed for mild pain or moderate pain.  [provider]  Active Self  atenolol  (TENORMIN ) 25 MG tablet [id number]  Take 1 tablet by mouth once daily Gottschalk, [name], DO  Active   atorvastatin  (LIPITOR) 80 MG tablet [id number]  Take 1 tablet (80 mg total) by mouth at bedtime. Jolinda Potter M, DO  Active   budeson-glycopyrrolate -formoterol (BREZTRI  AEROSPHERE) 160-9-4.8 MCG/ACT AERO [id number]  Inhale 2 puffs into the lungs 2 (two) times daily. Rinse mouth out after Jolinda Potter HERO, DO  Active            Med Note TENA, JULIE D   Fri Aug 20, 2023  3:59 PM) Via [place] patient assistance [place]    Coenzyme Q-10 100 MG capsule [id number]  Take 2 capsules (200 mg total) by mouth daily. Court Dorn PARAS, MD  Active Self  fenofibrate  (TRICOR ) 48 MG tablet [id number]  Take 1 tablet (48 mg total) by mouth daily. For triglycerides Jolinda Potter M, DO  Active   meloxicam  (MOBIC ) 15 MG tablet [id number]  Take 1 tablet (15 mg total) by mouth daily. Janit Thresa HERO, DPM  Active   metFORMIN  (GLUCOPHAGE ) 500 MG tablet [id number]  Take 1 tablet by mouth once daily with breakfast Jolinda Potter M, DO  Active   Multiple Vitamins-Minerals (ADULT GUMMY PO) [id number]  Take 1 capsule by mouth daily. [provider]  Active Self  polyethylene glycol (MIRALAX  / GLYCOLAX ) 17 g packet [id number]  Take 17 g by mouth daily as needed. [provider]  Active   Semaglutide,0.25 or 0.5MG /DOS, (OZEMPIC, 0.25 OR 0.5 MG/DOSE,) 2 MG/1.5ML SOPN  [id number]  Inject 0.5 mg into the skin once a week. [provider]  Active            Med Note TENA, MLISS BIRCH   Fri Aug 20, 2023  3:59 PM) Via [employer] patient assistance program    triamterene -hydrochlorothiazide  (MAXZIDE -25) 37.5-25 MG tablet [id number]  Take 1 tablet by mouth daily. Jolinda Potter M, DO  Active   Vitamin D , Ergocalciferol , (DRISDOL ) 1.25 MG (50000 UNIT) CAPS capsule [id number]  Take 1 capsule (50,000 Units total) by mouth every 7 (seven) days. Then follow up for vit d recheck Jolinda Potter HERO, DO  Active            SDOH Interventions    Flowsheet Row Patient Outreach Telephone from 02/14/2024 in [place] [facility] Most recent reading at 02/14/2024  2:40 PM Telephone from 02/14/2024 in [place] [facility]PART[facility] Most recent reading at 02/14/2024 11:37 AM Office Visit from 02/01/2024 in [facility] Most recent reading at 02/01/2024  3:11 PM Office Visit from 11/16/2023 in [facility] Most recent reading at 11/16/2023 11:25 AM Clinical Support from 08/10/2023 in [facility] Most recent reading at 08/10/2023  2:05 PM Office Visit from 10/16/2022 in [facility] Most recent reading at 10/16/2022  3:57 PM  SDOH Interventions        Food Insecurity  Interventions Intervention Not Indicated -- -- -- Intervention Not Indicated --  Housing Interventions Intervention Not Indicated -- -- -- Intervention Not Indicated --  Transportation Interventions Patient Resources (Friends/Family), Payor Benefit, [employer] Provided  [Pt said she only needed ride to [place] for [facility] but there are no resources in [facility] available for this service free of charge] [employer] Provided -- -- Intervention Not Indicated --  Utilities Interventions -- -- -- --  Intervention Not Indicated --  Alcohol Usage Interventions -- -- -- -- Intervention Not Indicated (Score <7) --  Depression Interventions/Treatment  Patient refuses Treatment  [Patient denied needing resources for depression/stress management today with VBCI LCSW] -- Counseling Counseling PHQ2-9 Score <4 Follow-up Not Indicated PHQ2-9 Score <4 Follow-up Not Indicated  Financial Strain Interventions Intervention Not Indicated -- -- -- Intervention Not Indicated --  Physical Activity Interventions Intervention Not Indicated  [Socialization Resource Education provided for her area] -- -- -- Intervention Not Indicated --  Stress Interventions Intervention Not Indicated  [Patient declined socialization and behavioral health resources] -- -- -- Intervention Not Indicated --  Social Connections Interventions -- -- -- -- Intervention Not Indicated --  Health Literacy Interventions -- -- -- -- Intervention Not Indicated --   Recommendation:   PCP Follow-up Specialty provider follow-up -Care coordination completed today regarding mutual patient. RCATS information was successfully mailed out by Care Guide today on 02/14/24 Continue Current Plan of Care  Follow Up Plan:   Closing From:  Complex Care Management  Lyle Rung, BSW, MSW, LCSW Licensed Clinical Social Worker [place]   [email or web address] Direct Dial: [phone]

## 2024-02-14 NOTE — Patient Instructions (Signed)
 Visit Information  Thank you for taking time to visit with me today. Please don't hesitate to contact me if I can be of assistance to you in the future!  Closing From: Complex Care Management.  Please call the care guide team at [phone] if you need to cancel, schedule, or reschedule an appointment.   Please call the Suicide and Crisis Lifeline: 988 call the [place]  National Suicide Prevention Lifeline: 629-121-8891 or TTY: 808 585 9055 TTY 808-712-7438) to talk to a trained counselor call 1-800-273-TALK (toll free, 24 hour hotline) go to [facility] [address] [place] [phone]) call the [facility] Crisis Line: ([phone] call 911 if you are experiencing a Mental Health or Behavioral Health Crisis or need someone to talk to.  Lyle Rung, BSW, MSW, LCSW Licensed Clinical Social Worker [employer] [facility]   [email or web address] Direct Dial: [phone]

## 2024-02-14 NOTE — Telephone Encounter (Signed)
 Copied from CRM [id number]. Topic: Referral - Request for Referral >> Feb 14, 2024  3:53 PM [name] wrote: Did the patient discuss referral with their provider in the last year? Yes (If No - schedule appointment) (If Yes - send message)  Appointment offered? No  Type of order/referral and detailed reason for visit:Pt is requesting a referral for home health aide  Preference of office, provider, location: [facility]  If referral order, have you been seen by this specialty before? No (If Yes, this issue or another issue? When? Where?  Can we respond through MyChart? No

## 2024-02-14 NOTE — Progress Notes (Signed)
 Complex Care Management Note Care Guide Note  02/14/2024 Name: Jennifer [name] MRN: [id number] DOB: January 12, 1951  Jennifer [name] is a 73 y.o. year old female who is a primary care patient of Jolinda Norene HERO, DO . The community resource team was consulted for assistance with Transportation Needs   SDOH screenings and interventions completed:  Yes  Social Drivers of Health From This Encounter   Transportation Needs: No Transportation Needs (02/14/2024)   PRAPARE - Administrator, Civil Service (Medical): No    Lack of Transportation (Non-Medical): No    SDOH Interventions Today    Flowsheet Row Most Recent Value  SDOH Interventions   Transportation Interventions Community Resources Provided     Care guide performed the following interventions: Patient provided with information about care guide support team and interviewed to confirm resource needs.  Follow Up Plan:  No further follow up planned at this time. The patient has been provided with needed resources.  Encounter Outcome:  Patient Visit Completed  Jermayne Sweeney Greenauer-Moran  [facility] Health[facility] Health Care Guide  Direct [phone] Fax:[phone] Website: [email or web address]

## 2024-02-14 NOTE — Telephone Encounter (Signed)
 HH ordered (including PT, I don't think they'll just give a home aid).

## 2024-02-21 ENCOUNTER — Encounter: Payer: Self-pay | Admitting: Radiology

## 2024-02-21 ENCOUNTER — Other Ambulatory Visit: Payer: Self-pay | Admitting: *Deleted

## 2024-02-21 DIAGNOSIS — E1159 Type 2 diabetes mellitus with other circulatory complications: Secondary | ICD-10-CM

## 2024-02-21 DIAGNOSIS — E1169 Type 2 diabetes mellitus with other specified complication: Secondary | ICD-10-CM

## 2024-03-08 ENCOUNTER — Telehealth: Payer: Self-pay

## 2024-03-08 NOTE — Telephone Encounter (Signed)
 Patient will call [facility].

## 2024-03-08 NOTE — Telephone Encounter (Signed)
 Per the referral notes in her chart:

## 2024-03-08 NOTE — Telephone Encounter (Signed)
 Copied from CRM [id number]. Topic: Referral - Request for Referral >> Feb 14, 2024  3:53 PM [name] wrote: Did the patient discuss referral with their provider in the last year? Yes (If No - schedule appointment) (If Yes - send message)  Appointment offered? No  Type of order/referral and detailed reason for visit:Pt is requesting a referral for home health aide  Preference of office, provider, location: [facility]  If referral order, have you been seen by this specialty before? No (If Yes, this issue or another issue? When? Where?  Can we respond through MyChart? No >> Mar 08, 2024 11:17 AM [name] wrote: Patient called wanting to get message over to Dr. Jolinda or her nurse. States she would like for someone to look into getting her some home health help to bathe her and fix her lunch. Please give patient a call back to discuss further.

## 2024-03-08 NOTE — Telephone Encounter (Signed)
 Patient is seeing a provider with [facility] in [place].. She is asking for a HH Aide to help her with bathing and food preparation.

## 2024-03-08 NOTE — Telephone Encounter (Deleted)
 Patient aware. She says she was told by pharmacy that she was not able to fill because her provider was no longer listed under medicaid.

## 2024-03-08 NOTE — Telephone Encounter (Signed)
 So again, this has already been ordered.  Please see original referral. Please route to referrals to check on status of this

## 2024-03-10 ENCOUNTER — Telehealth: Payer: Self-pay | Admitting: *Deleted

## 2024-03-10 NOTE — Telephone Encounter (Signed)
 Informed pt that [facility] aides are not able to do other ADL needs. Did give her the name of [facility] for her to contact to see what they might be able to offer since she has private ins not MCD which I am unable to do a PCS form for.

## 2024-03-10 NOTE — Telephone Encounter (Signed)
 Copied from CRM #[id number]. Topic: Clinical - Medical Advice >> Mar 09, 2024  1:10 PM Ivette P wrote: Reason for CRM: [facility] does not do personal care. if can find a CNA agency that does peersonal care.  [facility] only helps with bathing does not hep with daily activities such as making lunch and taking care of pt.  Pls follow up pt [phone]

## 2024-03-12 ENCOUNTER — Other Ambulatory Visit: Payer: Self-pay | Admitting: Family Medicine

## 2024-03-12 ENCOUNTER — Other Ambulatory Visit: Payer: Self-pay | Admitting: Podiatry

## 2024-03-12 DIAGNOSIS — E1169 Type 2 diabetes mellitus with other specified complication: Secondary | ICD-10-CM

## 2024-03-13 ENCOUNTER — Other Ambulatory Visit: Payer: Self-pay | Admitting: Family Medicine

## 2024-03-13 DIAGNOSIS — E1169 Type 2 diabetes mellitus with other specified complication: Secondary | ICD-10-CM

## 2024-03-13 MED ORDER — ATORVASTATIN CALCIUM 80 MG PO TABS: 80.0000 mg | ORAL_TABLET | Freq: Every day | ORAL | 0 refills | Status: AC

## 2024-03-13 NOTE — Telephone Encounter (Signed)
 Copied from CRM #[id number]. Topic: Clinical - Medication Refill >> Mar 13, 2024  2:17 PM Carla L wrote: Medication: atorvastatin  (LIPITOR) 80 MG tablet  Has the patient contacted their pharmacy? Yes Told to contact the office.   This is the patient's preferred pharmacy:  [facility] [address], [place] - [address] JEANETT HAMMERSMITH [address] [place] [address] Phone: [phone] Fax: [phone]  Is this the correct pharmacy for this prescription? Yes  Has the prescription been filled recently? No  Is the patient out of the medication? Yes  Has the patient been seen for an appointment in the last year OR does the patient have an upcoming appointment? Yes  Can we respond through MyChart? No  Agent: Please be advised that Rx refills may take up to 3 business days. We ask that you follow-up with your pharmacy.

## 2024-03-20 ENCOUNTER — Telehealth: Payer: Self-pay

## 2024-03-20 NOTE — Telephone Encounter (Signed)
 Copied from CRM #[id number]. Topic: Referral - Status >> Mar 20, 2024 12:41 PM Myrick T wrote: Reason for CRM: patient called stated she needs an order for a home health aide to come in and give her a bath through [facility]. Please f/u with

## 2024-03-21 NOTE — Telephone Encounter (Signed)
 Please review

## 2024-03-21 NOTE — Telephone Encounter (Signed)
 Please inform patient that it MAY be that the home health agency will not provide a aide without her having physical therapy evaluation as well

## 2024-03-21 NOTE — Telephone Encounter (Signed)
 Per referral notes:  Per Leita LABOR with [facility] - Their agency attempted to start care visits and Patient states she wanted to wait on results from MRI before she starts doing any type of PT.   Patient states she will call them back if she needs to schedule.   Patient needs to contact [facility] at this time to get her visits scheduled as she declined their services at the time she was contacted by them for start of care.

## 2024-03-23 ENCOUNTER — Telehealth: Payer: Self-pay

## 2024-03-23 NOTE — Telephone Encounter (Signed)
 Copied from CRM #[id number]. Topic: General - Other >> Mar 23, 2024  2:35 PM Wess RAMAN wrote: Reason for CRM: Patient wanted to let Jolinda Norene HERO, DO know that she will be working with [employer] and see how that goes first.  Patient Callback #: [phone]

## 2024-03-23 NOTE — Telephone Encounter (Signed)
 Attempted to contact - NA

## 2024-03-23 NOTE — Telephone Encounter (Signed)
Noted  -LS

## 2024-03-23 NOTE — Telephone Encounter (Signed)
 Patient wanted to let Jolinda Norene HERO, DO know that she will be working with [facility] and see how that goes first. LS

## 2024-03-30 ENCOUNTER — Telehealth: Payer: Self-pay

## 2024-04-10 ENCOUNTER — Telehealth: Payer: Self-pay | Admitting: Family Medicine

## 2024-04-10 NOTE — Telephone Encounter (Signed)
 Appointment scheduled.

## 2024-04-10 NOTE — Telephone Encounter (Signed)
 Copied from CRM [id number]. Topic: Appointments - Scheduling Inquiry for Clinic >> Apr 10, 2024  2:42 PM Antwanette L wrote: Reason for CRM: Patient is scheduled for spine surgery on 1/7 and had to cancel her physical with Dr. Jolinda because she is unsure when she will be able to come in. The patient wanted to make Dr. Jolinda aware of her upcoming surgery.

## 2024-04-10 NOTE — Telephone Encounter (Signed)
 Understood. I will cc to Luke and have her schedule her out for March/ April. I suspect she should be released to normal activity by then

## 2024-04-14 ENCOUNTER — Telehealth: Payer: Self-pay

## 2024-04-14 NOTE — Telephone Encounter (Signed)
 Copied from CRM #[id number]. Topic: Clinical - Medication Question >> Apr 14, 2024  2:48 PM Jennifer [name] wrote: Reason for CRM: Pt called insurance about getting patch for her to stop smoking for her upcoming surgery,   Insurance called in to see about the process and what is needed.   Please follow up with pt. Surgery is scheduled for 04/26/2024

## 2024-04-17 ENCOUNTER — Other Ambulatory Visit: Payer: Self-pay | Admitting: Family Medicine

## 2024-04-17 DIAGNOSIS — F17200 Nicotine dependence, unspecified, uncomplicated: Secondary | ICD-10-CM

## 2024-04-17 MED ORDER — NICOTINE 21 MG/24HR TD PT24: MEDICATED_PATCH | TRANSDERMAL | 0 refills | Status: AC

## 2024-04-17 MED ORDER — NICOTINE 14 MG/24HR TD PT24: MEDICATED_PATCH | TRANSDERMAL | 0 refills | Status: AC

## 2024-04-17 MED ORDER — NICOTINE 7 MG/24HR TD PT24: MEDICATED_PATCH | TRANSDERMAL | 0 refills | Status: AC

## 2024-04-17 NOTE — Telephone Encounter (Signed)
 Med sent.

## 2024-04-17 NOTE — Telephone Encounter (Signed)
 Pt is aware medication sent in.

## 2024-04-18 ENCOUNTER — Telehealth: Payer: Self-pay | Admitting: Family Medicine

## 2024-04-18 NOTE — Telephone Encounter (Unsigned)
 Copied from CRM #[id number]. Topic: Clinical - Prescription Issue >> Apr 18, 2024  4:33 PM Graeme ORN wrote: Reason for CRM: Patient called. States she was told medication was sent - patched to stop smoking - pharmacy says they have not received anything. Chart shows sent yesterday - she said she sent her daughter and just spoke with them and they have not received it. Confirmed correct pharmacy. Thank You

## 2024-04-18 NOTE — Telephone Encounter (Signed)
 Medication resent by provider. LS

## 2024-04-26 ENCOUNTER — Encounter: Payer: Self-pay | Admitting: Family Medicine

## 2024-04-28 NOTE — Progress Notes (Signed)
 Case Management Discharge Note        CSN: [id number] DOB: [birth year] Service: Neurosurgery Location: 806/01  Patient Class: Inpatient  DC Disposition: : Inpatient Rehab Facility  Discharge DC Disposition: : Inpatient Rehab Facility Placement Facility Type: Acute Rehab Acute Rehab: [facility] Inpatient [facility]  Discharge Referrals Patient Preference: Chosen geographical local area/county shared with patient/family: Yes Patient Preference for Post-Acute Provider Form completed: Yes Case closed, patient/family agree with disposition plan: Yes       Case Management Coordination Status: Coordination Complete     Charmaine GORMAN Hacking, RN

## 2024-04-28 NOTE — Nursing Note (Signed)
 Belongings collected. Patient discharged from [facility] to inpatient rehab. Report called to [place], CHARITY FUNDRAISER.

## 2024-04-28 NOTE — Discharge Summary (Signed)
 NEUROSURGERY DISCHARGE SUMMARY  Patient ID: Jennifer [name] [id number] 74 y.o. 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 female with PMH cervical spondylosis with myelopathy who presented with progressive weakness and myelopathy. She was admitted to [facility] on 04/26/2024 and taken to the operating theatre for the above noted procedure on the same date. She  tolerated the procedure well without complications. She was monitored on the neurosurgical floor postoperatively where she worked with therapy. She met surgical milestones appropriately. On the day of discharge she reports adequate pain control with an oral pain medication regimen, is ambulating independently and tolerating house diet without difficulty.  She is amenable to the plan for discharge to rehab.    Consults:  Rehab    Significant Diagnostic Studies:  None   Discharge Exam: Awake, alert, oriented x4 PERRL/EOMI Face symmetric  Delt WE WF Grip HI R 4 4+ 4+ 4+ 4+  L 4 4+ 4+ 4+ 4+  HF KE DF EHL PF R 4+ 4+ 5 5 5   L  4+ 4+ 5 5 5  Bilateral hand numbness incision c/d/I with dermabond   Discharge Medication List:   Medication List     ASK your doctor about these medications    acetaminophen  500 mg tablet Commonly known as: TYLENOL  Take 1,000 mg by mouth every 6 (six) hours as needed.   atenoloL  25 mg tablet Commonly known as: TENORMIN  Take 25 mg by mouth daily.   atorvastatin  80 mg tablet Commonly known as: LIPITOR Take 80 mg by mouth daily.   Breztri  Aerosphere 160-9-4.8 mcg/actuation inhaler Generic drug: budesonide-glycopyr-formoterol Inhale 2 puffs 2 (two) times a day as needed.   fenofibrate  nanocrystallized 48 mg tablet Commonly known as: TRICOR  Take 48 mg by mouth daily. for  triglycerides   meloxicam  15 mg tablet Commonly known as: MOBIC  Take 1 tablet by mouth daily.   metFORMIN  500 mg tablet Commonly known as: GLUCOPHAGE  Take 500 mg by mouth daily with breakfast.   polyethylene glycol 17 gram packet Commonly known as: GLYCOLAX  Take 17 g by mouth daily as needed.   triamterene -hydroCHLOROthiazide  37.5-25 mg per tablet Commonly known as: MAXZIDE -25 Take 1 tablet by mouth daily.   walker Misc Use as directed.        Disposition:  rehab  Patient Instructions:    See After Visit Summary for procedure specific home care instructions.  Call for fever greater than 101.5, redness, swelling or drainage at your incision site, new or worsening pain, loss of function, new numbness or tingling. Call for any other question or concern.  The number to call is [phone] Do not bend, lift, drive, move furniture or perform strenuous activity cleared at your follow up appointment.  Never combine controlled substance medications with alcohol.  Never drive while taking controlled substance medications.    Follow-up with  Your scheduled appointments are listed below. If you have any questions please call [phone]. Appointments which have been scheduled for you    May 09, 2024 11:40 AM POST OP with Tess Nadara Cramp, MD [facility] - Neurosurgery [facility] (--) [phone] [place] [address] [phone]  Please arrive 15 minutes prior to your scheduled appointment.    May 31, 2024 2:30 PM Office Visit with Phil Emery Stamps, DO [facility] GENERAL NEUROLOGY [facility]) [facility] [place] [address] [phone]  Please arrive 15 minutes prior to your scheduled visit.    Jun 05, 2024 1:40 PM POST OP with Tess Nadara Cramp, MD [facility] - Neurosurgery [facility] (--) [phone] [place]  [address] [phone]  Please arrive 15 minutes prior to your scheduled appointment.    Jul 17, 2024 1:40 PM POST OP with Tess Nadara Cramp, MD [facility] - Neurosurgery [facility] (--) [phone] [address] [place] [address] [phone]  Please arrive 15 minutes prior to your scheduled appointment.         Electronically signed by:  Tess Nadara Cramp, MD 04/28/2024 9:00 AM

## 2024-05-19 ENCOUNTER — Telehealth: Payer: Self-pay

## 2024-05-19 NOTE — Transitions of Care (Post Inpatient/ED Visit) (Signed)
" ° °  05/19/2024  Name: TRANIKA SCHOLLER MRN: [id number] DOB: 03/27/1951  Today's TOC FU Call Status: Today's TOC FU Call Status:: Unsuccessful Call (1st Attempt) Unsuccessful Call (1st Attempt) Date: 05/19/24  Attempted to reach the patient regarding the most recent Inpatient/ED visit. Left a HIPAA approved voicemail message to phone number provided in demographics per DPR.    Follow Up Plan: Additional outreach attempts will be made to reach the patient to complete the Transitions of Care (Post Inpatient/ED visit) call.   Richerd Fish, RN, BSN, CCM [facility], [facility] RN Care Manager Direct Dial: [phone]        "

## 2024-05-22 ENCOUNTER — Telehealth: Payer: Self-pay

## 2024-05-22 ENCOUNTER — Inpatient Hospital Stay: Admitting: Family

## 2024-05-22 NOTE — Transitions of Care (Post Inpatient/ED Visit) (Signed)
" ° °  05/22/2024  Name: Jennifer [name] MRN: [id number] DOB: 25-Nov-1950  Today's TOC FU Call Status: Today's TOC FU Call Status:: Unsuccessful Call (2nd Attempt) Unsuccessful Call (2nd Attempt) Date: 05/22/24  Attempted to reach the patient regarding the most recent Inpatient/ED visit.  Left a HIPAA approved voicemail message to phone number provided in demographics per DPR.    Follow Up Plan: Additional outreach attempts will be made to reach the patient to complete the Transitions of Care (Post Inpatient/ED visit) call.   Richerd Fish, RN, BSN, CCM [facility], [facility] Health RN Care Manager Direct Dial: [phone]        "

## 2024-06-12 ENCOUNTER — Inpatient Hospital Stay: Admitting: Family

## 2024-07-26 ENCOUNTER — Ambulatory Visit
